# Patient Record
Sex: Female | Born: 1990 | Race: Black or African American | Hispanic: No | Marital: Single | Smoking: Current some day smoker
Health system: Southern US, Community
[De-identification: ages and names within clinical notes are randomized; demographics above are authoritative.]

## PROBLEM LIST (undated history)

## (undated) ENCOUNTER — Inpatient Hospital Stay (HOSPITAL_COMMUNITY): Admission: RE | Payer: 59 | Source: Ambulatory Visit

## (undated) ENCOUNTER — Inpatient Hospital Stay (HOSPITAL_COMMUNITY): Payer: Self-pay

## (undated) ENCOUNTER — Ambulatory Visit (HOSPITAL_COMMUNITY): Admission: EM | Source: Home / Self Care

## (undated) ENCOUNTER — Emergency Department (HOSPITAL_COMMUNITY): Admission: EM | Payer: Self-pay

## (undated) ENCOUNTER — Inpatient Hospital Stay (HOSPITAL_COMMUNITY): Admission: RE | Payer: Self-pay | Source: Ambulatory Visit

## (undated) ENCOUNTER — Inpatient Hospital Stay (HOSPITAL_COMMUNITY): Payer: Medicaid Other

## (undated) DIAGNOSIS — F32A Depression, unspecified: Secondary | ICD-10-CM

## (undated) DIAGNOSIS — I10 Essential (primary) hypertension: Secondary | ICD-10-CM

## (undated) DIAGNOSIS — N159 Renal tubulo-interstitial disease, unspecified: Secondary | ICD-10-CM

## (undated) DIAGNOSIS — Z8619 Personal history of other infectious and parasitic diseases: Secondary | ICD-10-CM

## (undated) DIAGNOSIS — F329 Major depressive disorder, single episode, unspecified: Secondary | ICD-10-CM

## (undated) DIAGNOSIS — B009 Herpesviral infection, unspecified: Secondary | ICD-10-CM

## (undated) DIAGNOSIS — A749 Chlamydial infection, unspecified: Secondary | ICD-10-CM

## (undated) HISTORY — DX: Depression, unspecified: F32.A

## (undated) HISTORY — PX: NO PAST SURGERIES: SHX2092

## (undated) HISTORY — DX: Personal history of other infectious and parasitic diseases: Z86.19

## (undated) HISTORY — DX: Major depressive disorder, single episode, unspecified: F32.9

---
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
## 2012-08-03 ENCOUNTER — Inpatient Hospital Stay (HOSPITAL_COMMUNITY): Payer: 59

## 2012-08-03 ENCOUNTER — Encounter (HOSPITAL_COMMUNITY): Payer: Self-pay

## 2012-08-03 ENCOUNTER — Inpatient Hospital Stay (HOSPITAL_COMMUNITY)
Admission: AD | Admit: 2012-08-03 | Discharge: 2012-08-03 | Disposition: A | Discharge status: Home / Self Care | Payer: 59 | Source: Ambulatory Visit | Attending: Obstetrics & Gynecology | Admitting: Obstetrics & Gynecology

## 2012-08-03 DIAGNOSIS — O209 Hemorrhage in early pregnancy, unspecified: Secondary | ICD-10-CM | POA: Insufficient documentation

## 2012-08-03 DIAGNOSIS — O3680X0 Pregnancy with inconclusive fetal viability, not applicable or unspecified: Secondary | ICD-10-CM

## 2012-08-03 DIAGNOSIS — O4690 Antepartum hemorrhage, unspecified, unspecified trimester: Secondary | ICD-10-CM

## 2012-08-03 LAB — CBC WITH DIFFERENTIAL/PLATELET
Basophils Absolute: 0 10*3/uL (ref 0.0–0.1)
Basophils Relative: 0 % (ref 0–1)
Eosinophils Absolute: 0.2 10*3/uL (ref 0.0–0.7)
MCH: 27.5 pg (ref 26.0–34.0)
MCHC: 32.5 g/dL (ref 30.0–36.0)
Monocytes Relative: 8 % (ref 3–12)
Neutrophils Relative %: 67 % (ref 43–77)
Platelets: 317 10*3/uL (ref 150–400)
RDW: 13 % (ref 11.5–15.5)

## 2012-08-03 LAB — HCG, QUANTITATIVE, PREGNANCY: hCG, Beta Chain, Quant, S: 18353 m[IU]/mL — ABNORMAL HIGH (ref <5)

## 2012-08-03 LAB — WET PREP, GENITAL

## 2012-08-03 LAB — POCT PREGNANCY, URINE: Preg Test, Ur: POSITIVE — AB

## 2012-08-03 MED ORDER — PRENATAL MULTIVITAMIN CH: 1.0000 | ORAL_TABLET | Freq: Every day | ORAL | Status: DC

## 2012-08-03 NOTE — MAU Note (Signed)
Patient is in with c/o vaginal bleeding for a week. She denies any pain. She have not started prenatal care.

## 2012-08-03 NOTE — MAU Provider Note (Signed)
Chief Complaint: Vaginal Bleeding    First Provider Initiated Contact with Patient 08/03/12 2208     SUBJECTIVE HPI: Sharon Johnston is a 22 y.o. G2P1001 at [redacted]w[redacted]d by LMP who presents with light vaginal bleeding for a week. Denies abd pain, passage of tissue, fever, chills, vaginal discharge. She states she has not had any testing this pregnancy.    Past Medical History  Diagnosis Date  . Depression     hospitalized for 2 weeks after suicide att  . Hx of gonorrhea    OB History   Grav Para Term Preterm Abortions TAB SAB Ect Mult Living   2 1 1  0 0 0 0 0 0 1     # Outc Date GA Lbr Len/2nd Wgt Sex Del Anes PTL Lv   1 TRM 7/13 [redacted]w[redacted]d 12:30 / 00:30 1.610RU(0AV4UJ) M SVD EPI  Yes   2 CUR              Past Surgical History  Procedure Laterality Date  . No past surgeries     History   Social History  . Marital Status: Single    Spouse Name: N/A    Number of Children: N/A  . Years of Education: N/A   Occupational History  . Not on file.   Social History Main Topics  . Smoking status: Never Smoker   . Smokeless tobacco: Never Used  . Alcohol Use: No  . Drug Use: No  . Sexually Active: Yes   Other Topics Concern  . Not on file   Social History Narrative  . No narrative on file   No current facility-administered medications on file prior to encounter.   No current outpatient prescriptions on file prior to encounter.   No Known Allergies  ROS: Pertinent items in HPI  OBJECTIVE Height 5\' 3"  (1.6 m), weight 57.153 kg (126 lb), last menstrual period 06/02/2012. GENERAL: Well-developed, well-nourished female in no acute distress.  HEENT: Normocephalic HEART: normal rate RESP: normal effort ABDOMEN: Soft, non-tender EXTREMITIES: Nontender, no edema NEURO: Alert and oriented SPECULUM EXAM: NEFG, scant amount of blood in vault and coming from os. No lesions. Cervix clean, non-friable.  BIMANUAL: cervix closed; uterus normal size, no adnexal tenderness or masses  LAB  RESULTS Results for orders placed during the hospital encounter of 08/03/12 (from the past 24 hour(s))  POCT PREGNANCY, URINE     Status: Abnormal   Collection Time    08/03/12  7:54 PM      Result Value Range   Preg Test, Ur POSITIVE (*) NEGATIVE  HCG, QUANTITATIVE, PREGNANCY     Status: Abnormal   Collection Time    08/03/12  8:29 PM      Result Value Range   hCG, Beta Chain, Quant, S 18353 (*) <5 mIU/mL  CBC WITH DIFFERENTIAL     Status: Abnormal   Collection Time    08/03/12  8:30 PM      Result Value Range   WBC 6.7  4.0 - 10.5 K/uL   RBC 3.93  3.87 - 5.11 MIL/uL   Hemoglobin 10.8 (*) 12.0 - 15.0 g/dL   HCT 81.1 (*) 91.4 - 78.2 %   MCV 84.5  78.0 - 100.0 fL   MCH 27.5  26.0 - 34.0 pg   MCHC 32.5  30.0 - 36.0 g/dL   RDW 95.6  21.3 - 08.6 %   Platelets 317  150 - 400 K/uL   Neutrophils Relative % 67  43 - 77 %  Neutro Abs 4.5  1.7 - 7.7 K/uL   Lymphocytes Relative 23  12 - 46 %   Lymphs Abs 1.5  0.7 - 4.0 K/uL   Monocytes Relative 8  3 - 12 %   Monocytes Absolute 0.5  0.1 - 1.0 K/uL   Eosinophils Relative 2  0 - 5 %   Eosinophils Absolute 0.2  0.0 - 0.7 K/uL   Basophils Relative 0  0 - 1 %   Basophils Absolute 0.0  0.0 - 0.1 K/uL  ABO/RH     Status: None   Collection Time    08/03/12  8:30 PM      Result Value Range   ABO/RH(D) A POS      IMAGING   MAU COURSE  ASSESSMENT 1. Vaginal bleeding in pregnancy, first trimester   2. Pregnancy with uncertain fetal viability     PLAN Discharge home in stable condition. SAB precautions.  Pelvic rest x 1 week.     Follow-up Information   Follow up with [facility] [place] ULTRASOUND In 1 week.   Contact information:   [address] [place] [address] [phone]      Follow up with [facility] [place] MATERNITY ADMISSIONS. (As needed if symptoms worsen)    Contact information:   [address] [place] [address] [phone]       Medication List      TAKE these medications       prenatal multivitamin Tabs  Take 1 tablet by mouth daily.       [place], [place] 08/03/2012  10:13 PM

## 2012-08-04 LAB — GC/CHLAMYDIA PROBE AMP
CT Probe RNA: NEGATIVE
GC Probe RNA: NEGATIVE

## 2012-08-04 LAB — ABO/RH: ABO/RH(D): A POS

## 2012-08-07 ENCOUNTER — Inpatient Hospital Stay (HOSPITAL_COMMUNITY)
Admission: AD | Admit: 2012-08-07 | Discharge: 2012-08-07 | Disposition: A | Discharge status: Home / Self Care | Payer: 59 | Source: Ambulatory Visit | Attending: Family Medicine | Admitting: Family Medicine

## 2012-08-07 ENCOUNTER — Inpatient Hospital Stay (HOSPITAL_COMMUNITY): Payer: 59

## 2012-08-07 DIAGNOSIS — R109 Unspecified abdominal pain: Secondary | ICD-10-CM | POA: Insufficient documentation

## 2012-08-07 DIAGNOSIS — O039 Complete or unspecified spontaneous abortion without complication: Secondary | ICD-10-CM | POA: Insufficient documentation

## 2012-08-07 LAB — CBC
Hemoglobin: 11.4 g/dL — ABNORMAL LOW (ref 12.0–15.0)
MCV: 84.6 fL (ref 78.0–100.0)
Platelets: 331 10*3/uL (ref 150–400)
RBC: 4.16 MIL/uL (ref 3.87–5.11)
WBC: 5.9 10*3/uL (ref 4.0–10.5)

## 2012-08-07 LAB — HCG, QUANTITATIVE, PREGNANCY: hCG, Beta Chain, Quant, S: 11607 m[IU]/mL — ABNORMAL HIGH (ref <5)

## 2012-08-07 MED ORDER — IBUPROFEN 800 MG PO TABS: 800.0000 mg | ORAL_TABLET | Freq: Three times a day (TID) | ORAL | Status: DC | PRN

## 2012-08-07 NOTE — MAU Provider Note (Signed)
  History     CSN: [id number]  Arrival date and time: 08/07/12 2013   None     Chief Complaint  Patient presents with  . Vaginal Bleeding  . Abdominal Pain   HPI  Sharon Johnston is a 22 y.o. G2P1001 at [redacted]w[redacted]d who presents today via EMS with vaginal bleeding since 0300 today.   Past Medical History  Diagnosis Date  . Depression     hospitalized for 2 weeks after suicide att  . Hx of gonorrhea     Past Surgical History  Procedure Laterality Date  . No past surgeries      Family History  Problem Relation Age of Onset  . Hypertension Mother   . Hypertension Maternal Aunt   . Hypertension Maternal Grandmother   . Depression Cousin     History  Substance Use Topics  . Smoking status: Never Smoker   . Smokeless tobacco: Never Used  . Alcohol Use: No    Allergies: No Known Allergies  Prescriptions prior to admission  Medication Sig Dispense Refill  . Prenatal Vit-Fe Fumarate-FA (PRENATAL MULTIVITAMIN) TABS Take 1 tablet by mouth daily.  30 tablet  12    ROS Physical Exam   Blood pressure 120/63, pulse 69, temperature 98.7 F (37.1 C), temperature source Oral, resp. rate 16, height 5\' 3"  (1.6 m), weight 57.425 kg (126 lb 9.6 oz), last menstrual period 06/02/2012.  Physical Exam  MAU Course  Procedures  Results for orders placed during the hospital encounter of 08/07/12 (from the past 24 hour(s))  CBC     Status: Abnormal   Collection Time    08/07/12  8:30 PM      Result Value Range   WBC 5.9  4.0 - 10.5 K/uL   RBC 4.16  3.87 - 5.11 MIL/uL   Hemoglobin 11.4 (*) 12.0 - 15.0 g/dL   HCT 40.9 (*) 81.1 - 91.4 %   MCV 84.6  78.0 - 100.0 fL   MCH 27.4  26.0 - 34.0 pg   MCHC 32.4  30.0 - 36.0 g/dL   RDW 78.2  95.6 - 21.3 %   Platelets 331  150 - 400 K/uL  HCG, QUANTITATIVE, PREGNANCY     Status: Abnormal   Collection Time    08/07/12  8:30 PM      Result Value Range   hCG, Beta Chain, Quant, S 11607 (*) <5 mIU/mL   HCG is down from Results for Sharon Johnston, Sharon Johnston (MRN [id number]) as of 08/07/2012 21:21  Ref. Range 08/03/2012 20:29  hCG, Beta Chain, Quant, S Latest Range: <5 mIU/mL 18353 (H)      Assessment and Plan   1. SAB (spontaneous abortion)    FU in GYN clinic Ibuprofen given for discomfort Bleeding precautions discussed.   Tawnya Crook 08/07/2012, 9:20 PM

## 2012-08-07 NOTE — MAU Note (Signed)
Patient was seen in MAU a couple of days ago heavy bleeding started today around 3:00 a.m. Cramping passing little clots using toilet tissue has not changed pads

## 2012-08-08 NOTE — MAU Provider Note (Signed)
Chart reviewed and agree with management and plan.  

## 2012-08-10 ENCOUNTER — Ambulatory Visit (HOSPITAL_COMMUNITY): Payer: 59

## 2012-08-22 ENCOUNTER — Encounter: Payer: Self-pay | Admitting: Advanced Practice Midwife

## 2012-09-20 NOTE — Progress Notes (Signed)
This encounter was created in error - please disregard.

## 2012-12-11 ENCOUNTER — Emergency Department (HOSPITAL_COMMUNITY)
Admission: EM | Admit: 2012-12-11 | Discharge: 2012-12-12 | Disposition: A | Discharge status: Home / Self Care | Payer: 59 | Attending: Emergency Medicine | Admitting: Emergency Medicine

## 2012-12-11 ENCOUNTER — Encounter: Payer: Self-pay | Admitting: Nurse Practitioner

## 2012-12-11 ENCOUNTER — Encounter (HOSPITAL_COMMUNITY): Payer: Self-pay | Admitting: Emergency Medicine

## 2012-12-11 DIAGNOSIS — Z8659 Personal history of other mental and behavioral disorders: Secondary | ICD-10-CM | POA: Insufficient documentation

## 2012-12-11 DIAGNOSIS — Z331 Pregnant state, incidental: Secondary | ICD-10-CM

## 2012-12-11 DIAGNOSIS — R45851 Suicidal ideations: Secondary | ICD-10-CM | POA: Insufficient documentation

## 2012-12-11 DIAGNOSIS — Z8619 Personal history of other infectious and parasitic diseases: Secondary | ICD-10-CM | POA: Insufficient documentation

## 2012-12-11 LAB — RAPID URINE DRUG SCREEN, HOSP PERFORMED
Barbiturates: NOT DETECTED
Cocaine: NOT DETECTED

## 2012-12-11 LAB — COMPREHENSIVE METABOLIC PANEL
Alkaline Phosphatase: 49 U/L (ref 39–117)
BUN: 13 mg/dL (ref 6–23)
CO2: 25 mEq/L (ref 19–32)
Calcium: 9.1 mg/dL (ref 8.4–10.5)
Creatinine, Ser: 0.68 mg/dL (ref 0.50–1.10)
GFR calc Af Amer: >90 mL/min (ref >90)
GFR calc non Af Amer: >90 mL/min (ref >90)
Glucose, Bld: 104 mg/dL — ABNORMAL HIGH (ref 70–99)
Potassium: 3.2 mEq/L — ABNORMAL LOW (ref 3.5–5.1)
Total Protein: 7 g/dL (ref 6.0–8.3)

## 2012-12-11 LAB — CBC
HCT: 32.5 % — ABNORMAL LOW (ref 36.0–46.0)
Hemoglobin: 10.5 g/dL — ABNORMAL LOW (ref 12.0–15.0)
MCH: 26.5 pg (ref 26.0–34.0)
MCHC: 32.3 g/dL (ref 30.0–36.0)
MCV: 82.1 fL (ref 78.0–100.0)
RDW: 13.9 % (ref 11.5–15.5)

## 2012-12-11 LAB — POCT PREGNANCY, URINE: Preg Test, Ur: POSITIVE — AB

## 2012-12-11 LAB — ETHANOL: Alcohol, Ethyl (B): <11 mg/dL (ref 0–11)

## 2012-12-11 LAB — SALICYLATE LEVEL: Salicylate Lvl: <2 mg/dL — ABNORMAL LOW (ref 2.8–20.0)

## 2012-12-11 MED ORDER — ONDANSETRON HCL 4 MG PO TABS: 4.0000 mg | ORAL_TABLET | Freq: Three times a day (TID) | ORAL | Status: DC | PRN
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
## 2012-12-11 NOTE — ED Notes (Signed)
Sharon Johnston, pt mother can be contacted at [phone]

## 2012-12-11 NOTE — ED Notes (Signed)
Pt reports that she has been upset about her significant other and began having thoughts of harming herself by cutting herself with a knife today.  Denies HI or AVH. Pt calm and cooperative in triage.
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
## 2012-12-12 ENCOUNTER — Encounter (HOSPITAL_COMMUNITY): Payer: Self-pay | Admitting: *Deleted

## 2012-12-12 ENCOUNTER — Inpatient Hospital Stay (HOSPITAL_COMMUNITY)
Admission: AD | Admit: 2012-12-12 | Discharge: 2012-12-12 | Disposition: A | Discharge status: Home / Self Care | Payer: 59 | Source: Ambulatory Visit | Attending: Obstetrics & Gynecology | Admitting: Obstetrics & Gynecology

## 2012-12-12 ENCOUNTER — Inpatient Hospital Stay (HOSPITAL_COMMUNITY): Payer: 59

## 2012-12-12 DIAGNOSIS — O9989 Other specified diseases and conditions complicating pregnancy, childbirth and the puerperium: Secondary | ICD-10-CM

## 2012-12-12 DIAGNOSIS — A499 Bacterial infection, unspecified: Secondary | ICD-10-CM | POA: Insufficient documentation

## 2012-12-12 DIAGNOSIS — B9689 Other specified bacterial agents as the cause of diseases classified elsewhere: Secondary | ICD-10-CM | POA: Insufficient documentation

## 2012-12-12 DIAGNOSIS — R109 Unspecified abdominal pain: Secondary | ICD-10-CM | POA: Insufficient documentation

## 2012-12-12 DIAGNOSIS — O26899 Other specified pregnancy related conditions, unspecified trimester: Secondary | ICD-10-CM

## 2012-12-12 DIAGNOSIS — N760 Acute vaginitis: Secondary | ICD-10-CM | POA: Insufficient documentation

## 2012-12-12 DIAGNOSIS — O2390 Unspecified genitourinary tract infection in pregnancy, unspecified trimester: Secondary | ICD-10-CM | POA: Insufficient documentation

## 2012-12-12 LAB — URINALYSIS, ROUTINE W REFLEX MICROSCOPIC
Bilirubin Urine: NEGATIVE
Hgb urine dipstick: NEGATIVE
Leukocytes, UA: NEGATIVE
Nitrite: POSITIVE — AB
Protein, ur: NEGATIVE mg/dL
Specific Gravity, Urine: 1.025 (ref 1.005–1.030)
Urobilinogen, UA: 0.2 mg/dL (ref 0.0–1.0)
pH: 6.5 (ref 5.0–8.0)

## 2012-12-12 LAB — CBC
HCT: 32.5 % — ABNORMAL LOW (ref 36.0–46.0)
Hemoglobin: 10.5 g/dL — ABNORMAL LOW (ref 12.0–15.0)
MCH: 26.4 pg (ref 26.0–34.0)
MCHC: 32.3 g/dL (ref 30.0–36.0)
MCV: 81.7 fL (ref 78.0–100.0)
RBC: 3.98 MIL/uL (ref 3.87–5.11)

## 2012-12-12 LAB — WET PREP, GENITAL
Trich, Wet Prep: NONE SEEN
Yeast Wet Prep HPF POC: NONE SEEN

## 2012-12-12 MED ORDER — METRONIDAZOLE 500 MG PO TABS: 500.0000 mg | ORAL_TABLET | Freq: Two times a day (BID) | ORAL | Status: DC

## 2012-12-12 MED ORDER — POTASSIUM CHLORIDE CRYS ER 20 MEQ PO TBCR
40.0000 meq | EXTENDED_RELEASE_TABLET | ORAL | Status: AC
  Administered 2012-12-12: 40 meq via ORAL
  Filled 2012-12-12: qty 2

## 2012-12-12 NOTE — MAU Provider Note (Signed)
History     CSN: [id number]  Arrival date and time: 12/12/12 1558   First Provider Initiated Contact with Patient 12/12/12 1704      Chief Complaint  Patient presents with  . Abdominal Pain   HPI Sharon Johnston is a 22 y.o. G3P1011 at [redacted]w[redacted]d who presents to MAU today with complaint of lower abdominal pain. The patient was seen at [facility] last night, possibly by [facility], and told that she was pregnant. She was unaware as she had had a negative HPT about a week prior. She did not tell them about her abdominal pain at the hospital last night. She states that she has been having lower abdominal pain. It is rated at 2/10 now. She is also having a thick, white vaginal discharge. She denies vaginal bleeding, N/V/D or constipation, fever or UTI symptoms.   OB History   Grav Para Term Preterm Abortions TAB SAB Ect Mult Living   3 1 1  0 1 0 1 0 0 1      Past Medical History  Diagnosis Date  . Depression     hospitalized for 2 weeks after suicide att  . Hx of gonorrhea     Past Surgical History  Procedure Laterality Date  . No past surgeries      Family History  Problem Relation Age of Onset  . Hypertension Mother   . Hypertension Maternal Aunt   . Hypertension Maternal Grandmother   . Depression Cousin     History  Substance Use Topics  . Smoking status: Never Smoker   . Smokeless tobacco: Never Used  . Alcohol Use: No    Allergies: No Known Allergies  No prescriptions prior to admission    Review of Systems  Constitutional: Negative for fever and malaise/fatigue.  Gastrointestinal: Positive for abdominal pain. Negative for nausea, vomiting, diarrhea and constipation.  Genitourinary: Negative for dysuria, urgency and frequency.       Neg - vaginal bleeding + vaginal discharge  Neurological: Negative for dizziness and loss of consciousness.   Physical Exam   Blood pressure 135/80, pulse 71, temperature 98.1 F (36.7 C), temperature source Oral, resp. rate 16, height  5\' 3"  (1.6 m), weight 122 lb (55.339 kg), last menstrual period 11/07/2012.  Physical Exam  Constitutional: She is oriented to person, place, and time. She appears well-developed and well-nourished. No distress.  HENT:  Head: Normocephalic and atraumatic.  Cardiovascular: Normal rate, regular rhythm and normal heart sounds.   Respiratory: Effort normal and breath sounds normal. No respiratory distress.  GI: Soft. Bowel sounds are normal. She exhibits no distension and no mass. There is tenderness (moderate tenderness to palpation of the lower abdomen at midline). There is no rebound and no guarding.  Genitourinary: Uterus is tender (mild tenderness to palpation on bimanual exam). Uterus is not enlarged. Cervix exhibits no motion tenderness, no discharge and no friability. Right adnexum displays tenderness. Right adnexum displays no mass. Left adnexum displays tenderness. Left adnexum displays no mass. No bleeding around the vagina. Vaginal discharge (small amount of thin, white, frothy discahrge noted) found.  Neurological: She is alert and oriented to person, place, and time.  Skin: Skin is warm and dry. No erythema.  Psychiatric: She has a normal mood and affect.   Results for orders placed during the hospital encounter of 12/12/12 (from the past 24 hour(s))  URINALYSIS, ROUTINE W REFLEX MICROSCOPIC     Status: Abnormal   Collection Time    12/12/12  4:54 PM  Result Value Range   Color, Urine YELLOW  YELLOW   APPearance HAZY (*) CLEAR   Specific Gravity, Urine 1.025  1.005 - 1.030   pH 6.5  5.0 - 8.0   Glucose, UA NEGATIVE  NEGATIVE mg/dL   Hgb urine dipstick NEGATIVE  NEGATIVE   Bilirubin Urine NEGATIVE  NEGATIVE   Ketones, ur NEGATIVE  NEGATIVE mg/dL   Protein, ur NEGATIVE  NEGATIVE mg/dL   Urobilinogen, UA 0.2  0.0 - 1.0 mg/dL   Nitrite POSITIVE (*) NEGATIVE   Leukocytes, UA NEGATIVE  NEGATIVE  URINE MICROSCOPIC-ADD ON     Status: Abnormal   Collection Time    12/12/12  4:54  PM      Result Value Range   Squamous Epithelial / LPF FEW (*) RARE   WBC, UA 3-6  <3 WBC/hpf   Bacteria, UA MANY (*) RARE  WET PREP, GENITAL     Status: Abnormal   Collection Time    12/12/12  5:14 PM      Result Value Range   Yeast Wet Prep HPF POC NONE SEEN  NONE SEEN   Trich, Wet Prep NONE SEEN  NONE SEEN   Clue Cells Wet Prep HPF POC FEW (*) NONE SEEN   WBC, Wet Prep HPF POC MODERATE (*) NONE SEEN  CBC     Status: Abnormal   Collection Time    12/12/12  5:21 PM      Result Value Range   WBC 4.0  4.0 - 10.5 K/uL   RBC 3.98  3.87 - 5.11 MIL/uL   Hemoglobin 10.5 (*) 12.0 - 15.0 g/dL   HCT 16.1 (*) 09.6 - 04.5 %   MCV 81.7  78.0 - 100.0 fL   MCH 26.4  26.0 - 34.0 pg   MCHC 32.3  30.0 - 36.0 g/dL   RDW 40.9  81.1 - 91.4 %   Platelets 354  150 - 400 K/uL  HCG, QUANTITATIVE, PREGNANCY     Status: Abnormal   Collection Time    12/12/12  5:21 PM      Result Value Range   hCG, Beta Chain, Quant, S 1018 (*) <5 mIU/mL   US Ob Comp Less 14 Wks  12/12/2012   CLINICAL DATA:  Lower abdominal pain  EXAM: OBSTETRIC <14 WK ULTRASOUND  TECHNIQUE: Transabdominal ultrasound was performed for evaluation of the gestation as well as the maternal uterus and adnexal regions.  COMPARISON:  08/07/2012  FINDINGS: Intrauterine gestational sac: Visualized  Yolk sac:  None  Embryo:  None  MSD: 0.19 cm   4 w   4 d [place] EDC: 08/17/2013  Maternal uterus/adnexae:  Subchorionic hemorrhage: Small  Right ovary: Normal  Left ovary: Normal containing corpus luteum  Other :None  Free fluid: Trace  IMPRESSION: 1. Single, small intrauterine gestational sac. The estimated gestational age is 4 weeks and 4 days. No yolk sac or embryo identified. Suggest followup imaging in 7-10 days.  2. Small subchorionic hemorrhage.  3. Trace free fluid within the pelvis.   Electronically Signed   By: Signa Kell M.D.   On: 12/12/2012 18:09   US Ob Transvaginal  12/12/2012   CLINICAL DATA:  Lower abdominal pain  EXAM: OBSTETRIC <14 WK  ULTRASOUND  TECHNIQUE: Transabdominal ultrasound was performed for evaluation of the gestation as well as the maternal uterus and adnexal regions.  COMPARISON:  08/07/2012  FINDINGS: Intrauterine gestational sac: Visualized  Yolk sac:  None  Embryo:  None  MSD: 0.19 cm  4 w   4 d [place] EDC: 08/17/2013  Maternal uterus/adnexae:  Subchorionic hemorrhage: Small  Right ovary: Normal  Left ovary: Normal containing corpus luteum  Other :None  Free fluid: Trace  IMPRESSION: 1. Single, small intrauterine gestational sac. The estimated gestational age is 4 weeks and 4 days. No yolk sac or embryo identified. Suggest followup imaging in 7-10 days.  2. Small subchorionic hemorrhage.  3. Trace free fluid within the pelvis.   Electronically Signed   By: Signa Kell M.D.   On: 12/12/2012 18:09    MAU Course  Procedures None  MDM UA, Wet prep, GC/Chlamydia, CBC, quant hCG and [place] today A+ blood type from previous visit in Epic  Assessment and Plan  A: IUGS at [redacted]w[redacted]d without YS/FP Bacterial vaginosis  P: Discharge home Rx for Flagyl sent to patient's pharmacy Patient will return to MAU in 48 hours for repeat quant hCG Bleeding/ectopic precautions discussed Patient may return to MAU as needed sooner  Freddi Starr, PA-C  12/12/2012, 6:48 PM

## 2012-12-12 NOTE — ED Notes (Signed)
Patient arrived to unit with no s/s of distress noted. Pt pleasant and cooperative with staff. Pt denies SI/HI and verbally contracts for safety.

## 2012-12-12 NOTE — ED Provider Notes (Signed)
Medical screening examination/treatment/procedure(s) were performed by non-physician practitioner and as supervising physician I was immediately available for consultation/collaboration.  Shon Baton, MD 12/12/12 [phone]
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
## 2012-12-12 NOTE — MAU Note (Signed)
Has been having abd pain, cramping- feel like cycle is going to start, since Monday. Went to [employer]  Last night (first said [facility]) was told she is pregnant.  She did not tell them she was having pain.

## 2012-12-12 NOTE — ED Notes (Signed)
AVS printed and given to patient for reference.

## 2012-12-12 NOTE — ED Notes (Signed)
Pt signed safety contract, agrees to no self harm.

## 2012-12-13 LAB — GC/CHLAMYDIA PROBE AMP: CT Probe RNA: NEGATIVE

## 2012-12-14 ENCOUNTER — Inpatient Hospital Stay (HOSPITAL_COMMUNITY)
Admission: AD | Admit: 2012-12-14 | Discharge: 2012-12-14 | Disposition: A | Discharge status: Home / Self Care | Payer: 59 | Source: Ambulatory Visit | Attending: Obstetrics & Gynecology | Admitting: Obstetrics & Gynecology

## 2012-12-14 DIAGNOSIS — O26899 Other specified pregnancy related conditions, unspecified trimester: Secondary | ICD-10-CM

## 2012-12-14 DIAGNOSIS — O9989 Other specified diseases and conditions complicating pregnancy, childbirth and the puerperium: Secondary | ICD-10-CM

## 2012-12-14 DIAGNOSIS — O99891 Other specified diseases and conditions complicating pregnancy: Secondary | ICD-10-CM | POA: Insufficient documentation

## 2012-12-14 DIAGNOSIS — R109 Unspecified abdominal pain: Secondary | ICD-10-CM

## 2012-12-14 LAB — URINE CULTURE: Colony Count: >=100000

## 2012-12-14 LAB — HCG, QUANTITATIVE, PREGNANCY: hCG, Beta Chain, Quant, S: 2185 m[IU]/mL — ABNORMAL HIGH (ref <5)

## 2012-12-14 NOTE — MAU Note (Signed)
Patient to MAU for repeat BHCG. Patient denies pain or bleeding. States she had one episode of diarrhea this am with blood in it.

## 2012-12-14 NOTE — MAU Provider Note (Signed)
Attestation of Attending Supervision of Advanced Practitioner (CNM/NP): Evaluation and management procedures were performed by the Advanced Practitioner under my supervision and collaboration.  I have reviewed the Advanced Practitioner's note and chart, and I agree with the management and plan.  HARRAWAY-SMITH, Concepcion Kirkpatrick 3:26 PM     

## 2012-12-14 NOTE — MAU Provider Note (Signed)
[name] is a 22 y.o. G3P1011 at [redacted]w[redacted]d who presents to MAU today for follow-up quant hCG. Quant hCG on 12/12/12 was 1018. The patient denies abdominal pain or bleeding today.   BP 116/67  Pulse 73  Temp(Src) 98.2 F (36.8 C) (Oral)  Resp 16  SpO2 100%  LMP 11/07/2012 GENERAL: Well-developed, well-nourished female in no acute distress.  HEENT: Normocephalic, atraumatic.   LUNGS: Effort normal HEART: Regular rate  SKIN: Warm, dry and without erythema PSYCH: Normal mood and affect  Results for orders placed during the hospital encounter of 12/14/12 (from the past 24 hour(s))  HCG, QUANTITATIVE, PREGNANCY     Status: Abnormal   Collection Time    12/14/12 10:57 AM      Result Value Range   hCG, Beta Chain, Quant, S 2185 (*) <5 mIU/mL    A: Appropriate rise in quant hCG  P: Discharge home [place] scheduled for 12/21/12 to confirm viability First trimester warning signs discussed Patient may return to MAU as needed sooner  Freddi Starr, PA-C 12/14/2012 1:03 PM

## 2012-12-15 ENCOUNTER — Other Ambulatory Visit: Payer: Self-pay | Admitting: Family

## 2012-12-16 NOTE — Progress Notes (Signed)
Called [phone] (disconnected) and left message at [phone] for patient to call MAU to inform regarding +UTI and RX for Keflex sent to pharmacy on file.  Please inform patient when and if the call MAU or at next prenatal care appointment.

## 2012-12-21 ENCOUNTER — Inpatient Hospital Stay (HOSPITAL_COMMUNITY)
Admission: AD | Admit: 2012-12-21 | Discharge: 2012-12-21 | Disposition: A | Discharge status: Home / Self Care | Payer: 59 | Source: Ambulatory Visit | Attending: Obstetrics & Gynecology | Admitting: Obstetrics & Gynecology

## 2012-12-21 ENCOUNTER — Telehealth: Payer: Self-pay | Admitting: Medical

## 2012-12-21 ENCOUNTER — Ambulatory Visit (HOSPITAL_COMMUNITY)
Admit: 2012-12-21 | Discharge: 2012-12-21 | Disposition: A | Discharge status: Home / Self Care | Payer: 59 | Attending: Medical | Admitting: Medical

## 2012-12-21 DIAGNOSIS — Z3689 Encounter for other specified antenatal screening: Secondary | ICD-10-CM | POA: Insufficient documentation

## 2012-12-21 NOTE — Telephone Encounter (Signed)
Called to inform patient of normal [place] results. IUP at [redacted]w[redacted]d with cardiac activity noted. Patient unsure of where she will go for prenatal care. Declines referral to [facility] clinics.   Freddi Starr, PA-C 12/21/2012 4:33 PM

## 2013-02-06 ENCOUNTER — Encounter: Payer: Self-pay | Admitting: Advanced Practice Midwife

## 2013-03-21 NOTE — L&D Delivery Note (Signed)
Delivery Note At 3:56 AM a viable female was delivered via  (Presentation: direct OA ).  APGAR: 9,9.   Placenta status: delivered with cord traction via Tomasa Blase.   Cord:  3 vessels with no following complications  Anesthesia: Epidural  Episiotomy: None Lacerations: None Suture Repair: n/a Est. Blood Loss (mL): 200 ml  Mom to postpartum.  Baby to Couplet care / Skin to Skin.  Antionette Char 08/11/2013, 4:05 AM

## 2013-03-25 ENCOUNTER — Ambulatory Visit (INDEPENDENT_AMBULATORY_CARE_PROVIDER_SITE_OTHER): Payer: 59 | Admitting: Obstetrics

## 2013-03-25 ENCOUNTER — Encounter: Payer: Self-pay | Admitting: Obstetrics

## 2013-03-25 VITALS — BP 122/71 | Temp 97.9°F | Wt 131.0 lb

## 2013-03-25 DIAGNOSIS — Z3201 Encounter for pregnancy test, result positive: Secondary | ICD-10-CM

## 2013-03-25 DIAGNOSIS — Z3482 Encounter for supervision of other normal pregnancy, second trimester: Secondary | ICD-10-CM

## 2013-03-25 DIAGNOSIS — Z363 Encounter for antenatal screening for malformations: Secondary | ICD-10-CM

## 2013-03-25 DIAGNOSIS — N760 Acute vaginitis: Secondary | ICD-10-CM

## 2013-03-25 DIAGNOSIS — Z1389 Encounter for screening for other disorder: Secondary | ICD-10-CM

## 2013-03-25 LAB — POCT URINALYSIS DIPSTICK
Bilirubin, UA: NEGATIVE
Blood, UA: NEGATIVE
GLUCOSE UA: NEGATIVE
KETONES UA: NEGATIVE
Leukocytes, UA: NEGATIVE
Nitrite, UA: NEGATIVE
PROTEIN UA: NEGATIVE
Spec Grav, UA: 1.01
UROBILINOGEN UA: NEGATIVE
pH, UA: 6

## 2013-03-25 LAB — POCT URINE PREGNANCY: PREG TEST UR: POSITIVE

## 2013-03-25 NOTE — Progress Notes (Signed)
HR - 65 Pt in office today for new OB visit, reports white vaginal discharge with odor and irritation, would like to be checked for STDs  Subjective:    Agape N Earlene PlaterDavis is being seen today for her first obstetrical visit.  This is not a planned pregnancy. She is at 745w5d gestation. Her obstetrical history is significant for none. Relationship with FOB: not involved. Patient does intend to breast feed. Pregnancy history fully reviewed.  Menstrual History: OB History   Grav Para Term Preterm Abortions TAB SAB Ect Mult Living   3 1 1  0 1 0 1 0 0 1      Menarche age: 33 years  Patient's last menstrual period was 11/07/2012.    The following portions of the patient's history were reviewed and updated as appropriate: allergies, current medications, past family history, past medical history, past social history, past surgical history and problem list.  Review of Systems Pertinent items are noted in HPI.    Objective:    General appearance: alert and no distress Abdomen: normal findings: soft, non-tender Pelvic: cervix normal in appearance, external genitalia normal, no adnexal masses or tenderness, no cervical motion tenderness, rectovaginal septum normal and vagina normal without discharge.  Uterus 19 weeks, soft, NT.  Vagina with copious thin, grey discharge. Extremities: extremities normal, atraumatic, no cyanosis or edema    Assessment:    Pregnancy at 195w5d weeks    Plan:    Initial labs drawn. Prenatal vitamins.  Counseling provided regarding continued use of seat belts, cessation of alcohol consumption, smoking or use of illicit drugs; infection precautions i.e., influenza/TDAP immunizations, toxoplasmosis,CMV, parvovirus, listeria and varicella; workplace safety, exercise during pregnancy; routine dental care, safe medications, sexual activity, hot tubs, saunas, pools, travel, caffeine use, fish and methlymercury, potential toxins, hair treatments, varicose veins Weight gain  recommendations per IOM guidelines reviewed: underweight/BMI< 18.5--> gain 28 - 40 lbs; normal weight/BMI 18.5 - 24.9--> gain 25 - 35 lbs; overweight/BMI 25 - 29.9--> gain 15 - 25 lbs; obese/BMI >30->gain  11 - 20 lbs Problem list reviewed and updated. FIRST/CF mutation testing/NIPT/QUAD SCREEN discussed: requested. Role of ultrasound in pregnancy discussed; fetal survey: requested. Amniocentesis discussed: not indicated. VBAC calculator score: VBAC consent form provided Follow up in 4 weeks. 50% of 20 min visit spent on counseling and coordination of care.

## 2013-03-26 LAB — GC/CHLAMYDIA PROBE AMP
CT PROBE, AMP APTIMA: NEGATIVE
GC Probe RNA: NEGATIVE

## 2013-03-26 LAB — PAP IG W/ RFLX HPV ASCU

## 2013-03-26 LAB — WET PREP BY MOLECULAR PROBE
CANDIDA SPECIES: NEGATIVE
Gardnerella vaginalis: NEGATIVE
TRICHOMONAS VAG: NEGATIVE

## 2013-03-27 ENCOUNTER — Ambulatory Visit (INDEPENDENT_AMBULATORY_CARE_PROVIDER_SITE_OTHER): Payer: 59

## 2013-03-27 ENCOUNTER — Encounter: Payer: Self-pay | Admitting: Obstetrics & Gynecology

## 2013-03-27 ENCOUNTER — Other Ambulatory Visit: Payer: Medicaid Other

## 2013-03-27 DIAGNOSIS — Z363 Encounter for antenatal screening for malformations: Secondary | ICD-10-CM

## 2013-03-27 DIAGNOSIS — Z1389 Encounter for screening for other disorder: Secondary | ICD-10-CM

## 2013-03-27 LAB — CULTURE, OB URINE: Colony Count: >=100000

## 2013-03-27 LAB — US OB DETAIL + 14 WK

## 2013-03-28 ENCOUNTER — Other Ambulatory Visit: Payer: Self-pay | Admitting: *Deleted

## 2013-03-28 DIAGNOSIS — N390 Urinary tract infection, site not specified: Secondary | ICD-10-CM

## 2013-03-28 LAB — OBSTETRIC PANEL
Antibody Screen: NEGATIVE
BASOS PCT: 0 % (ref 0–1)
Basophils Absolute: 0 10*3/uL (ref 0.0–0.1)
Eosinophils Absolute: 0.1 10*3/uL (ref 0.0–0.7)
Eosinophils Relative: 1 % (ref 0–5)
HCT: 35.5 % — ABNORMAL LOW (ref 36.0–46.0)
HEP B S AG: NEGATIVE
Hemoglobin: 12 g/dL (ref 12.0–15.0)
LYMPHS PCT: 13 % (ref 12–46)
Lymphs Abs: 0.9 10*3/uL (ref 0.7–4.0)
MCH: 28 pg (ref 26.0–34.0)
MCHC: 33.8 g/dL (ref 30.0–36.0)
MCV: 82.8 fL (ref 78.0–100.0)
Monocytes Absolute: 0.3 10*3/uL (ref 0.1–1.0)
Monocytes Relative: 4 % (ref 3–12)
NEUTROS ABS: 5.8 10*3/uL (ref 1.7–7.7)
NEUTROS PCT: 82 % — AB (ref 43–77)
Platelets: 381 10*3/uL (ref 150–400)
RBC: 4.29 MIL/uL (ref 3.87–5.11)
RDW: 14.5 % (ref 11.5–15.5)
RUBELLA: 1.69 {index} — AB (ref <0.90)
Rh Type: POSITIVE
WBC: 7 10*3/uL (ref 4.0–10.5)

## 2013-03-28 LAB — VARICELLA ZOSTER ANTIBODY, IGG: VARICELLA IGG: 2797 {index} — AB (ref <135.00)

## 2013-03-28 LAB — VITAMIN D 25 HYDROXY (VIT D DEFICIENCY, FRACTURES): Vit D, 25-Hydroxy: 20 ng/mL — ABNORMAL LOW (ref 30–89)

## 2013-03-28 LAB — HIV ANTIBODY (ROUTINE TESTING W REFLEX): HIV: NONREACTIVE

## 2013-03-28 MED ORDER — SULFAMETHOXAZOLE-TMP DS 800-160 MG PO TABS: 1.0000 | ORAL_TABLET | Freq: Two times a day (BID) | ORAL | Status: DC

## 2013-03-29 LAB — HEMOGLOBINOPATHY EVALUATION
HGB A2 QUANT: 2.6 % (ref 2.2–3.2)
HGB F QUANT: 0 % (ref 0.0–2.0)
HGB S QUANTITAION: 0 %
Hemoglobin Other: 0 %
Hgb A: 97.4 % (ref 96.8–97.8)

## 2013-04-21 ENCOUNTER — Inpatient Hospital Stay (HOSPITAL_COMMUNITY)
Admission: AD | Admit: 2013-04-21 | Discharge: 2013-04-21 | Disposition: A | Discharge status: Home / Self Care | Payer: Medicaid Other | Source: Ambulatory Visit | Attending: Family Medicine | Admitting: Family Medicine

## 2013-04-21 ENCOUNTER — Encounter (HOSPITAL_COMMUNITY): Payer: Self-pay | Admitting: *Deleted

## 2013-04-21 DIAGNOSIS — Y929 Unspecified place or not applicable: Secondary | ICD-10-CM | POA: Insufficient documentation

## 2013-04-21 DIAGNOSIS — O99891 Other specified diseases and conditions complicating pregnancy: Secondary | ICD-10-CM | POA: Insufficient documentation

## 2013-04-21 DIAGNOSIS — N390 Urinary tract infection, site not specified: Secondary | ICD-10-CM | POA: Insufficient documentation

## 2013-04-21 DIAGNOSIS — N3090 Cystitis, unspecified without hematuria: Secondary | ICD-10-CM

## 2013-04-21 DIAGNOSIS — O9989 Other specified diseases and conditions complicating pregnancy, childbirth and the puerperium: Principal | ICD-10-CM

## 2013-04-21 DIAGNOSIS — N898 Other specified noninflammatory disorders of vagina: Secondary | ICD-10-CM | POA: Insufficient documentation

## 2013-04-21 DIAGNOSIS — O2390 Unspecified genitourinary tract infection in pregnancy, unspecified trimester: Secondary | ICD-10-CM | POA: Insufficient documentation

## 2013-04-21 DIAGNOSIS — R109 Unspecified abdominal pain: Secondary | ICD-10-CM | POA: Insufficient documentation

## 2013-04-21 DIAGNOSIS — R51 Headache: Secondary | ICD-10-CM | POA: Insufficient documentation

## 2013-04-21 LAB — WET PREP, GENITAL
Clue Cells Wet Prep HPF POC: NONE SEEN
Trich, Wet Prep: NONE SEEN
Yeast Wet Prep HPF POC: NONE SEEN

## 2013-04-21 LAB — URINALYSIS, ROUTINE W REFLEX MICROSCOPIC
Bilirubin Urine: NEGATIVE
Glucose, UA: NEGATIVE mg/dL
Ketones, ur: NEGATIVE mg/dL
LEUKOCYTES UA: NEGATIVE
NITRITE: POSITIVE — AB
PH: 6.5 (ref 5.0–8.0)
PROTEIN: NEGATIVE mg/dL
Specific Gravity, Urine: >1.03 — ABNORMAL HIGH (ref 1.005–1.030)
Urobilinogen, UA: 0.2 mg/dL (ref 0.0–1.0)

## 2013-04-21 LAB — OB RESULTS CONSOLE GC/CHLAMYDIA
CHLAMYDIA, DNA PROBE: NEGATIVE
Gonorrhea: NEGATIVE

## 2013-04-21 LAB — URINE MICROSCOPIC-ADD ON

## 2013-04-21 MED ORDER — SULFAMETHOXAZOLE-TMP DS 800-160 MG PO TABS: 1.0000 | ORAL_TABLET | Freq: Two times a day (BID) | ORAL | Status: DC

## 2013-04-21 MED ORDER — ALBUTEROL SULFATE (2.5 MG/3ML) 0.083% IN NEBU: 2.5000 mg | INHALATION_SOLUTION | Freq: Once | RESPIRATORY_TRACT | Status: DC

## 2013-04-21 MED ORDER — ACETAMINOPHEN 325 MG PO TABS
650.0000 mg | ORAL_TABLET | Freq: Once | ORAL | Status: AC
  Administered 2013-04-21: 650 mg via ORAL
  Filled 2013-04-21: qty 2

## 2013-04-21 MED ORDER — IPRATROPIUM BROMIDE 0.02 % IN SOLN: 0.5000 mg | Freq: Once | RESPIRATORY_TRACT | Status: DC

## 2013-04-21 NOTE — Discharge Instructions (Signed)
Pregnancy and Urinary Tract Infection  A urinary tract infection (UTI) is a bacterial infection of the urinary tract. Infection of the urinary tract can include the ureters, kidneys (pyelonephritis), bladder (cystitis), and urethra (urethritis). All pregnant women should be screened for bacteria in the urinary tract. Identifying and treating a UTI will decrease the risk of preterm labor and developing more serious infections in both the mother and baby.  CAUSES  Bacteria germs cause almost all UTIs.   RISK FACTORS  Many factors can increase your chances of getting a UTI during pregnancy. These include:  · Having a short urethra.  · Poor toilet and hygiene habits.  · Sexual intercourse.  · Blockage of urine along the urinary tract.  · Problems with the pelvic muscles or nerves.  · Diabetes.  · Obesity.  · Bladder problems after having several children.  · Previous history of UTI.  SIGNS AND SYMPTOMS   · Pain, burning, or a stinging feeling when urinating.  · Suddenly feeling the need to urinate right away (urgency).  · Loss of bladder control (urinary incontinence).  · Frequent urination, more than is common with pregnancy.  · Lower abdominal or back discomfort.  · Cloudy urine.  · Blood in the urine (hematuria).  · Fever.   When the kidneys are infected, the symptoms may be:  · Back pain.  · Flank pain on the right side more so than the left.  · Fever.  · Chills.  · Nausea.  · Vomiting.  DIAGNOSIS   A urinary tract infection is usually diagnosed through urine tests. Additional tests and procedures are sometimes done. These may include:  · Ultrasound exam of the kidneys, ureters, bladder, and urethra.  · Looking in the bladder with a lighted tube (cystoscopy).  TREATMENT  Typically, UTIs can be treated with antibiotic medicines.   HOME CARE INSTRUCTIONS   · Only take over-the-counter or prescription medicines as directed by your health care provider. If you were prescribed antibiotics, take them as directed. Finish  them even if you start to feel better.  · Drink enough fluids to keep your urine clear or pale yellow.  · Do not have sexual intercourse until the infection is gone and your health care provider says it is okay.  · Make sure you are tested for UTIs throughout your pregnancy. These infections often come back.   Preventing a UTI in the Future  · Practice good toilet habits. Always wipe from front to back. Use the tissue only once.  · Do not hold your urine. Empty your bladder as soon as possible when the urge comes.  · Do not douche or use deodorant sprays.  · Wash with soap and warm water around the genital area and the anus.  · Empty your bladder before and after sexual intercourse.  · Wear underwear with a cotton crotch.  · Avoid caffeine and carbonated drinks. They can irritate the bladder.  · Drink cranberry juice or take cranberry pills. This may decrease the risk of getting a UTI.  · Do not drink alcohol.  · Keep all your appointments and tests as scheduled.   SEEK MEDICAL CARE IF:   · Your symptoms get worse.  · You are still having fevers 2 or more days after treatment begins.  · You have a rash.  · You feel that you are having problems with medicines prescribed.  · You have abnormal vaginal discharge.  SEEK IMMEDIATE MEDICAL CARE IF:   · You have back or flank   pain.  · You have chills.  · You have blood in your urine.  · You have nausea and vomiting.  · You have contractions of your uterus.  · You have a gush of fluid from the vagina.  MAKE SURE YOU:  · Understand these instructions.    · Will watch your condition.    · Will get help right away if you are not doing well or get worse.    Document Released: 07/02/2010 Document Revised: 12/26/2012 Document Reviewed: 10/04/2012  [employer]® Patient Information ©2014 [employer].
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
## 2013-04-22 ENCOUNTER — Encounter: Payer: Self-pay | Admitting: Obstetrics

## 2013-04-22 ENCOUNTER — Ambulatory Visit (INDEPENDENT_AMBULATORY_CARE_PROVIDER_SITE_OTHER): Payer: 59 | Admitting: Obstetrics

## 2013-04-22 ENCOUNTER — Other Ambulatory Visit: Payer: Medicaid Other

## 2013-04-22 VITALS — BP 106/68 | Temp 98.0°F | Wt 138.0 lb

## 2013-04-22 DIAGNOSIS — Z3480 Encounter for supervision of other normal pregnancy, unspecified trimester: Secondary | ICD-10-CM

## 2013-04-22 DIAGNOSIS — R82998 Other abnormal findings in urine: Secondary | ICD-10-CM

## 2013-04-22 DIAGNOSIS — R8290 Unspecified abnormal findings in urine: Secondary | ICD-10-CM

## 2013-04-22 LAB — POCT URINALYSIS DIPSTICK
BILIRUBIN UA: NEGATIVE
Blood, UA: NEGATIVE
GLUCOSE UA: NEGATIVE
Ketones, UA: NEGATIVE
NITRITE UA: POSITIVE
Protein, UA: NEGATIVE
Spec Grav, UA: 1.015
Urobilinogen, UA: NEGATIVE
pH, UA: 7

## 2013-04-22 LAB — CBC
HEMATOCRIT: 32.7 % — AB (ref 36.0–46.0)
Hemoglobin: 10.7 g/dL — ABNORMAL LOW (ref 12.0–15.0)
MCH: 27.6 pg (ref 26.0–34.0)
MCHC: 32.7 g/dL (ref 30.0–36.0)
MCV: 84.3 fL (ref 78.0–100.0)
Platelets: 283 10*3/uL (ref 150–400)
RBC: 3.88 MIL/uL (ref 3.87–5.11)
RDW: 15.5 % (ref 11.5–15.5)
WBC: 4.8 10*3/uL (ref 4.0–10.5)

## 2013-04-22 LAB — GC/CHLAMYDIA PROBE AMP
CT Probe RNA: NEGATIVE
GC PROBE AMP APTIMA: NEGATIVE

## 2013-04-22 NOTE — Progress Notes (Signed)
Pulse: 64 Patient would like to know when her next ultrasound will be.

## 2013-04-23 LAB — HIV ANTIBODY (ROUTINE TESTING W REFLEX): HIV: NONREACTIVE

## 2013-04-23 LAB — URINE CULTURE

## 2013-04-23 LAB — GLUCOSE TOLERANCE, 2 HOURS W/ 1HR
Glucose, 1 hour: 106 mg/dL (ref 70–170)
Glucose, 2 hour: 94 mg/dL (ref 70–139)
Glucose, Fasting: 57 mg/dL — ABNORMAL LOW (ref 70–99)

## 2013-04-23 LAB — RPR

## 2013-04-24 LAB — CULTURE, OB URINE: Colony Count: >=100000

## 2013-05-13 ENCOUNTER — Ambulatory Visit (INDEPENDENT_AMBULATORY_CARE_PROVIDER_SITE_OTHER): Payer: Medicaid Other | Admitting: Obstetrics

## 2013-05-13 ENCOUNTER — Encounter: Payer: Self-pay | Admitting: Obstetrics

## 2013-05-13 VITALS — BP 116/67 | Temp 98.5°F | Wt 140.0 lb

## 2013-05-13 DIAGNOSIS — B3731 Acute candidiasis of vulva and vagina: Secondary | ICD-10-CM

## 2013-05-13 DIAGNOSIS — Z3480 Encounter for supervision of other normal pregnancy, unspecified trimester: Secondary | ICD-10-CM

## 2013-05-13 DIAGNOSIS — B373 Candidiasis of vulva and vagina: Secondary | ICD-10-CM

## 2013-05-13 LAB — POCT URINALYSIS DIPSTICK
Bilirubin, UA: NEGATIVE
GLUCOSE UA: NEGATIVE
Ketones, UA: NEGATIVE
Leukocytes, UA: NEGATIVE
NITRITE UA: NEGATIVE
PH UA: 8
Protein, UA: NEGATIVE
RBC UA: NEGATIVE
SPEC GRAV UA: 1.01
Urobilinogen, UA: NEGATIVE

## 2013-05-13 MED ORDER — FLUCONAZOLE 150 MG PO TABS: 150.0000 mg | ORAL_TABLET | Freq: Once | ORAL | Status: DC

## 2013-05-13 NOTE — Progress Notes (Signed)
Pulse 87 Pt states that she may have a yeast infection.  Pt states that she is having vaginal itching with thick white d/c.

## 2013-05-20 ENCOUNTER — Encounter: Payer: 59 | Admitting: Obstetrics

## 2013-05-27 ENCOUNTER — Encounter: Payer: 59 | Admitting: Obstetrics

## 2013-06-15 ENCOUNTER — Inpatient Hospital Stay (HOSPITAL_COMMUNITY)
Admission: AD | Admit: 2013-06-15 | Discharge: 2013-06-15 | Disposition: A | Discharge status: Home / Self Care | Payer: Medicaid Other | Source: Ambulatory Visit | Attending: Obstetrics | Admitting: Obstetrics

## 2013-06-15 ENCOUNTER — Encounter (HOSPITAL_COMMUNITY): Payer: Self-pay

## 2013-06-15 DIAGNOSIS — R1084 Generalized abdominal pain: Secondary | ICD-10-CM

## 2013-06-15 DIAGNOSIS — O99891 Other specified diseases and conditions complicating pregnancy: Secondary | ICD-10-CM | POA: Insufficient documentation

## 2013-06-15 DIAGNOSIS — O9989 Other specified diseases and conditions complicating pregnancy, childbirth and the puerperium: Principal | ICD-10-CM

## 2013-06-15 DIAGNOSIS — F32A Depression, unspecified: Secondary | ICD-10-CM | POA: Clinically undetermined

## 2013-06-15 DIAGNOSIS — N898 Other specified noninflammatory disorders of vagina: Secondary | ICD-10-CM | POA: Insufficient documentation

## 2013-06-15 DIAGNOSIS — N949 Unspecified condition associated with female genital organs and menstrual cycle: Secondary | ICD-10-CM

## 2013-06-15 DIAGNOSIS — R109 Unspecified abdominal pain: Secondary | ICD-10-CM | POA: Insufficient documentation

## 2013-06-15 DIAGNOSIS — F329 Major depressive disorder, single episode, unspecified: Secondary | ICD-10-CM | POA: Clinically undetermined

## 2013-06-15 LAB — URINALYSIS, ROUTINE W REFLEX MICROSCOPIC
Bilirubin Urine: NEGATIVE
Glucose, UA: NEGATIVE mg/dL
Hgb urine dipstick: NEGATIVE
Ketones, ur: NEGATIVE mg/dL
Nitrite: NEGATIVE
Protein, ur: NEGATIVE mg/dL
SPECIFIC GRAVITY, URINE: 1.02 (ref 1.005–1.030)
UROBILINOGEN UA: 0.2 mg/dL (ref 0.0–1.0)
pH: 7 (ref 5.0–8.0)

## 2013-06-15 LAB — WET PREP, GENITAL
CLUE CELLS WET PREP: NONE SEEN
TRICH WET PREP: NONE SEEN
YEAST WET PREP: NONE SEEN

## 2013-06-15 LAB — URINE MICROSCOPIC-ADD ON

## 2013-06-15 NOTE — MAU Note (Signed)
Patient states she has been having vaginal discharge for about one week. Started having abdominal pain yesterday. Denies bleeding. Reports good fetal movement.

## 2013-06-15 NOTE — MAU Provider Note (Signed)
Chief Complaint:  Abdominal Pain and Vaginal Discharge   First Provider Initiated Contact with Patient 06/15/13 1313      HPI: Sharon Johnston is a 23 y.o. G3P1011 at 262w3d who presents to maternity admissions reporting 2 day hx of suprapubic and groin intermittent sharp pains.  Denies upper abd pain, leakage of fluid or vaginal bleeding. Good fetal movement.   Pregnancy Course: Essentially uncomplicated  Past Medical History: Past Medical History  Diagnosis Date  . Depression     hospitalized for 2 weeks after suicide att  . Hx of gonorrhea     Past obstetric history: OB History  Gravida Para Term Preterm AB SAB TAB Ectopic Multiple Living  3 1 1  0 1 1 0 0 0 1    # Outcome Date GA Lbr Len/2nd Weight Sex Delivery Anes PTL Lv  3 CUR           2 TRM 10/18/11 858w4d 12:30 / 00:30 3.402 kg (7 lb 8 oz) M SVD EPI  Y  1 SAB  3869w0d            Comments: System Generated. Please review and update pregnancy details.      Past Surgical History: Past Surgical History  Procedure Laterality Date  . No past surgeries       Family History: Family History  Problem Relation Age of Onset  . Hypertension Mother   . Hypertension Maternal Aunt   . Hypertension Maternal Grandmother   . Depression Cousin     Social History: History  Substance Use Topics  . Smoking status: Never Smoker   . Smokeless tobacco: Never Used  . Alcohol Use: No    Allergies: No Known Allergies  Meds:  Prescriptions prior to admission  Medication Sig Dispense Refill  . fluconazole (DIFLUCAN) 150 MG tablet Take 1 tablet (150 mg total) by mouth once.  1 tablet  2  . Prenatal Vit-Fe Fumarate-FA (PRENATAL MULTIVITAMIN) TABS tablet Take 1 tablet by mouth daily at 12 noon.        ROS: Pertinent findings in history of present illness.  Physical Exam  Blood pressure 114/59, pulse 97, temperature 98.5 F (36.9 C), temperature source Oral, resp. rate 16, height 5' 2.5" (1.588 m), weight 64.411 kg (142 lb), last  menstrual period 11/07/2012, SpO2 97.00%. GENERAL: Well-developed, well-nourished female in no acute distress.  HEENT: normocephalic HEART: normal rate RESP: normal effort ABDOMEN: Soft, non-tender, gravid appropriate for gestational age EXTREMITIES: Nontender, no edema NEURO: alert and oriented SPECULUM EXAM: NEFG, physiologic discharge, no blood, cervix clean Dilation: Closed Effacement (%): Thick Cervical Position: Posterior Station:  (high) Exam by:: D. Klair Leising, CNM  FHT:  Baseline 140, moderate variability, accelerations present, no decelerations Contractions: none   Labs: Results for orders placed during the hospital encounter of 06/15/13 (from the past 24 hour(s))  URINALYSIS, ROUTINE W REFLEX MICROSCOPIC     Status: Abnormal   Collection Time    06/15/13 12:45 PM      Result Value Ref Range   Color, Urine YELLOW  YELLOW   APPearance CLEAR  CLEAR   Specific Gravity, Urine 1.020  1.005 - 1.030   pH 7.0  5.0 - 8.0   Glucose, UA NEGATIVE  NEGATIVE mg/dL   Hgb urine dipstick NEGATIVE  NEGATIVE   Bilirubin Urine NEGATIVE  NEGATIVE   Ketones, ur NEGATIVE  NEGATIVE mg/dL   Protein, ur NEGATIVE  NEGATIVE mg/dL   Urobilinogen, UA 0.2  0.0 - 1.0 mg/dL   Nitrite  NEGATIVE  NEGATIVE   Leukocytes, UA TRACE (*) NEGATIVE  URINE MICROSCOPIC-ADD ON     Status: Abnormal   Collection Time    06/15/13 12:45 PM      Result Value Ref Range   Squamous Epithelial / LPF MANY (*) RARE   WBC, UA 7-10  <3 WBC/hpf   Bacteria, UA MANY (*) RARE   Urine-Other MUCOUS PRESENT    WET PREP, GENITAL     Status: Abnormal   Collection Time    06/15/13  1:18 PM      Result Value Ref Range   Yeast Wet Prep HPF POC NONE SEEN  NONE SEEN   Trich, Wet Prep NONE SEEN  NONE SEEN   Clue Cells Wet Prep HPF POC NONE SEEN  NONE SEEN   WBC, Wet Prep HPF POC FEW (*) NONE SEEN    Imaging:  No results found.  MAU Course:   Assessment: 1. Round ligament pain   G3P1011 at [redacted]w[redacted]d Cat 1 FHR  Plan: Discharge  home with reassurance and RLP relief measures    Medication List    STOP taking these medications       fluconazole 150 MG tablet  Commonly known as:  DIFLUCAN      TAKE these medications       calcium carbonate 500 MG chewable tablet  Commonly known as:  TUMS - dosed in mg elemental calcium  Chew 2 tablets by mouth daily as needed for indigestion or heartburn.     prenatal multivitamin Tabs tablet  Take 1 tablet by mouth daily at 12 noon.       Follow-up Information   Follow up with [facility] On 06/19/2013.   Contact information:   [address] [place] [address] [phone]     Danae Orleans, CNM 06/15/2013 1:23 PM

## 2013-07-09 ENCOUNTER — Ambulatory Visit (INDEPENDENT_AMBULATORY_CARE_PROVIDER_SITE_OTHER): Payer: Medicaid Other | Admitting: Obstetrics

## 2013-07-09 VITALS — BP 123/76 | HR 82 | Temp 98.1°F | Wt 151.0 lb

## 2013-07-09 DIAGNOSIS — Z3480 Encounter for supervision of other normal pregnancy, unspecified trimester: Secondary | ICD-10-CM

## 2013-07-09 DIAGNOSIS — N390 Urinary tract infection, site not specified: Secondary | ICD-10-CM

## 2013-07-09 DIAGNOSIS — K219 Gastro-esophageal reflux disease without esophagitis: Secondary | ICD-10-CM

## 2013-07-09 DIAGNOSIS — J309 Allergic rhinitis, unspecified: Secondary | ICD-10-CM

## 2013-07-09 DIAGNOSIS — J302 Other seasonal allergic rhinitis: Secondary | ICD-10-CM

## 2013-07-09 LAB — POCT URINALYSIS DIPSTICK
BILIRUBIN UA: NEGATIVE
Glucose, UA: NEGATIVE
Ketones, UA: NEGATIVE
Nitrite, UA: POSITIVE
RBC UA: NEGATIVE
SPEC GRAV UA: 1.015
Urobilinogen, UA: NEGATIVE
pH, UA: 7

## 2013-07-10 ENCOUNTER — Encounter: Payer: Medicaid Other | Admitting: Obstetrics

## 2013-07-10 ENCOUNTER — Encounter: Payer: Self-pay | Admitting: Obstetrics

## 2013-07-10 MED ORDER — OMEPRAZOLE 20 MG PO CPDR: 20.0000 mg | DELAYED_RELEASE_CAPSULE | Freq: Two times a day (BID) | ORAL | Status: DC

## 2013-07-10 MED ORDER — LORATADINE 10 MG PO TABS: 10.0000 mg | ORAL_TABLET | Freq: Every day | ORAL | Status: DC

## 2013-07-10 NOTE — Progress Notes (Signed)
Subjective:    Sharon Johnston is a 23 y.o. female being seen today for her obstetrical visit. She is at 2585w0d gestation. Patient reports heartburn and seasonal rhinitis. Fetal movement: normal.   Past Medical History  Diagnosis Date  . Depression     hospitalized for 2 weeks after suicide att  . Hx of gonorrhea     Past Surgical History  Procedure Laterality Date  . No past surgeries      Current outpatient prescriptions:calcium carbonate (TUMS - DOSED IN MG ELEMENTAL CALCIUM) 500 MG chewable tablet, Chew 2 tablets by mouth daily as needed for indigestion or heartburn., Disp: , Rfl: ;  Prenatal Vit-Fe Fumarate-FA (PRENATAL MULTIVITAMIN) TABS tablet, Take 1 tablet by mouth daily at 12 noon., Disp: , Rfl:  No Known Allergies  History  Substance Use Topics  . Smoking status: Never Smoker   . Smokeless tobacco: Never Used  . Alcohol Use: No    Family History  Problem Relation Age of Onset  . Hypertension Mother   . Hypertension Maternal Aunt   . Hypertension Maternal Grandmother   . Depression Cousin      Review of Systems Constitutional: negative for anorexia Gastrointestinal: negative for abdominal pain Genitourinary:negative for vaginal discharge Musculoskeletal:negative for back pain Behavioral/Psych: negative for depression and tobacco use   Objective:    BP 123/76  Pulse 82  Temp(Src) 98.1 F (36.7 C)  Wt 151 lb (68.493 kg)  LMP 11/07/2012 FHT:  150 BPM  Uterine Size: size equals dates  Presentation: unsure     Assessment:    Pregnancy @ 7985w0d weeks   Seasonal rhinitis  GERD  Plan:    Claritin Rx for allergic rhinitis  Omeprazole Rx for GERD   labs reviewed, problem list updated Consent signed. GBS sent TDAP offered  Rhogam given for RH negative Pediatrician: discussed. Infant feeding: plans to breastfeed. Maternity leave: discussed. Cigarette smoking: never smoked. Orders Placed This Encounter  Procedures  . Culture, OB Urine  . POCT  urinalysis dipstick   No orders of the defined types were placed in this encounter.   Follow up in 1 Week.

## 2013-07-12 LAB — CULTURE, OB URINE

## 2013-07-18 ENCOUNTER — Encounter: Payer: Self-pay | Admitting: Obstetrics

## 2013-07-18 ENCOUNTER — Other Ambulatory Visit: Payer: Self-pay | Admitting: *Deleted

## 2013-07-18 ENCOUNTER — Ambulatory Visit (INDEPENDENT_AMBULATORY_CARE_PROVIDER_SITE_OTHER): Payer: Medicaid Other | Admitting: Obstetrics

## 2013-07-18 VITALS — BP 148/75 | HR 76 | Wt 151.6 lb

## 2013-07-18 DIAGNOSIS — O2340 Unspecified infection of urinary tract in pregnancy, unspecified trimester: Secondary | ICD-10-CM

## 2013-07-18 DIAGNOSIS — Z3480 Encounter for supervision of other normal pregnancy, unspecified trimester: Secondary | ICD-10-CM

## 2013-07-18 LAB — POCT URINALYSIS DIPSTICK
Bilirubin, UA: NEGATIVE
Blood, UA: NEGATIVE
Glucose, UA: NEGATIVE
KETONES UA: NEGATIVE
NITRITE UA: NEGATIVE
PH UA: 6.5
Spec Grav, UA: 1.015
UROBILINOGEN UA: NEGATIVE

## 2013-07-18 MED ORDER — SULFAMETHOXAZOLE-TMP DS 800-160 MG PO TABS: 1.0000 | ORAL_TABLET | Freq: Two times a day (BID) | ORAL | Status: DC

## 2013-07-18 NOTE — Progress Notes (Signed)
Subjective:    Sharon Johnston is a 23 y.o. female being seen today for her obstetrical visit. She is at 4724w1d gestation. Patient reports no complaints. Fetal movement: normal.  Problem List Items Addressed This Visit   None    Visit Diagnoses   Supervision of other normal pregnancy    -  Primary    Relevant Orders       Strep B DNA probe       POCT urinalysis dipstick (Completed)      Patient Active Problem List   Diagnosis Date Noted  . Depression 06/15/2013  . Candidiasis of vulva and vagina 05/13/2013  . Vaginitis and vulvovaginitis, unspecified 03/25/2013   Objective:    BP 148/75  Pulse 76  Wt 151 lb 9.6 oz (68.765 kg)  LMP 11/07/2012 FHT:  150 BPM  Uterine Size: size equals dates  Presentation: unsure     Assessment:    Pregnancy @ 6624w1d weeks   Plan:     labs reviewed, problem list updated Consent signed. GBS sent TDAP offered  Rhogam given for RH negative Pediatrician: discussed. Infant feeding: plans to breastfeed. Maternity leave: not discussed. Cigarette smoking: never smoked. Orders Placed This Encounter  Procedures  . Strep B DNA probe  . POCT urinalysis dipstick   No orders of the defined types were placed in this encounter.   Follow up in 1 Week.

## 2013-07-20 LAB — STREP B DNA PROBE: STREP GROUP B AG: NEGATIVE

## 2013-07-25 ENCOUNTER — Ambulatory Visit (INDEPENDENT_AMBULATORY_CARE_PROVIDER_SITE_OTHER): Payer: Medicaid Other | Admitting: Obstetrics

## 2013-07-25 VITALS — BP 122/63 | HR 78 | Temp 98.5°F | Wt 150.0 lb

## 2013-07-25 DIAGNOSIS — Z3480 Encounter for supervision of other normal pregnancy, unspecified trimester: Secondary | ICD-10-CM

## 2013-07-25 LAB — POCT URINALYSIS DIPSTICK
Glucose, UA: NEGATIVE
Ketones, UA: NEGATIVE
Leukocytes, UA: NEGATIVE
Nitrite, UA: NEGATIVE
PROTEIN UA: NEGATIVE
RBC UA: NEGATIVE
Spec Grav, UA: 1.025
pH, UA: 6

## 2013-07-26 ENCOUNTER — Encounter: Payer: Self-pay | Admitting: Obstetrics

## 2013-07-26 NOTE — Progress Notes (Signed)
Subjective:    Sharon Johnston is a [age] y.o. female being seen today for her obstetrical visit. She is at 494w2d gestation. Patient reports no complaints. Fetal movement: normal.  Problem List Items Addressed This Visit   None    Visit Diagnoses   Supervision of other normal pregnancy    -  Primary    Relevant Orders       POCT urinalysis dipstick (Completed)      Patient Active Problem List   Diagnosis Date Noted  . Depression 06/15/2013  . Candidiasis of vulva and vagina 05/13/2013  . Vaginitis and vulvovaginitis, unspecified 03/25/2013    Objective:    BP 122/63  Pulse 78  Temp(Src) 98.5 F (36.9 C)  Wt 150 lb (68.04 kg)  LMP 11/07/2012 FHT: 150 BPM  Uterine Size: size equals dates  Presentations: unsure  Pelvic Exam: Deferred    Assessment:    Pregnancy @ 7994w2d weeks   Plan:   Plans for delivery: Vaginal anticipated; labs reviewed; problem list updated Counseling: Consent signed. Infant feeding: plans to breastfeed. Cigarette smoking: never smoked. L&D discussion: symptoms of labor, discussed when to call, discussed what number to call, anesthetic/analgesic options reviewed and delivering clinician:  plans Physician. Postpartum supports and preparation: circumcision discussed and contraception plans discussed.  Follow up in 1 Week.

## 2013-08-01 ENCOUNTER — Ambulatory Visit (INDEPENDENT_AMBULATORY_CARE_PROVIDER_SITE_OTHER): Payer: Medicaid Other | Admitting: Obstetrics

## 2013-08-01 VITALS — BP 130/80 | HR 86 | Temp 98.2°F | Wt 156.0 lb

## 2013-08-01 DIAGNOSIS — Z3480 Encounter for supervision of other normal pregnancy, unspecified trimester: Secondary | ICD-10-CM

## 2013-08-01 LAB — POCT URINALYSIS DIPSTICK
Blood, UA: NEGATIVE
GLUCOSE UA: NEGATIVE
Ketones, UA: NEGATIVE
Leukocytes, UA: NEGATIVE
Nitrite, UA: NEGATIVE
PH UA: 7
SPEC GRAV UA: 1.02

## 2013-08-02 ENCOUNTER — Encounter: Payer: Self-pay | Admitting: Obstetrics

## 2013-08-02 NOTE — Progress Notes (Signed)
Subjective:    Sharon Johnston is a 23 y.o. female being seen today for her obstetrical visit. She is at 241w2d gestation. Patient reports no complaints. Fetal movement: normal.  Problem List Items Addressed This Visit   None    Visit Diagnoses   Supervision of other normal pregnancy    -  Primary    Relevant Orders       POCT urinalysis dipstick (Completed)      Patient Active Problem List   Diagnosis Date Noted  . Depression 06/15/2013  . Candidiasis of vulva and vagina 05/13/2013  . Vaginitis and vulvovaginitis, unspecified 03/25/2013    Objective:    BP 130/80  Pulse 86  Temp(Src) 98.2 F (36.8 C)  Wt 156 lb (70.761 kg)  LMP 11/07/2012 FHT: 150 BPM  Uterine Size: size equals dates  Presentations: unsure  Pelvic Exam:   Deferred    Assessment:    Pregnancy @ 4941w2d weeks   Plan:   Plans for delivery: Vaginal anticipated; labs reviewed; problem list updated Counseling: Consent signed. Infant feeding: plans to breastfeed. Cigarette smoking: never smoked. L&D discussion: symptoms of labor, discussed when to call, discussed what number to call, anesthetic/analgesic options reviewed and delivering clinician:  plans Physician. Postpartum supports and preparation: circumcision discussed and contraception plans discussed.  Follow up in 1 Week.

## 2013-08-04 ENCOUNTER — Encounter (HOSPITAL_COMMUNITY): Payer: Self-pay | Admitting: *Deleted

## 2013-08-04 ENCOUNTER — Inpatient Hospital Stay (HOSPITAL_COMMUNITY)
Admission: AD | Admit: 2013-08-04 | Discharge: 2013-08-04 | Disposition: A | Discharge status: Home / Self Care | Payer: Medicaid Other | Source: Ambulatory Visit | Attending: Obstetrics | Admitting: Obstetrics

## 2013-08-04 DIAGNOSIS — O9989 Other specified diseases and conditions complicating pregnancy, childbirth and the puerperium: Principal | ICD-10-CM

## 2013-08-04 DIAGNOSIS — O99891 Other specified diseases and conditions complicating pregnancy: Secondary | ICD-10-CM | POA: Insufficient documentation

## 2013-08-04 NOTE — MAU Note (Signed)
Leaked clear fld with some "reddish" in it but no further leaking.. Had two contractions afterward and just cramping now

## 2013-08-04 NOTE — MAU Note (Signed)
PT SAYS SHE STARTED HURTING BAD AT 0100.     SHE THINKS  AT 0100-   WENT TO B-ROOM-  SAYS HER UNDERWEAR WERE WET .  SAYS NO MORE FLUID HAS COME OUT.    NO  VE IN OFFICE.   DENIES HSV AND MRSA.  GBS-  NEG.

## 2013-08-04 NOTE — MAU Provider Note (Signed)
S: [age] y.o. [id number] @[redacted]w[redacted]d  pt of Dr Clearance CootsHarper presents to MAU reporting leakage of fluid x1 episode while sitting on the toilet. She denies leakage since this episode and has not required a pad.  She reports good fetal movement, denies regular contractions, vaginal bleeding, vaginal itching/burning, urinary symptoms, h/a, dizziness, n/v, or fever/chills.    O: BP 141/79  Pulse 79  Temp(Src) 98.2 F (36.8 C)  Resp 20  Ht 5\' 3"  (1.6 m)  Wt 72.485 kg (159 lb 12.8 oz)  BMI 28.31 kg/m2  LMP 11/07/2012  Speculum exam with negative pooling  Ferning negative on slide  Dilation: Closed Effacement (%):  (LONG) Cervical Position: Posterior Exam by:: Leza Apsey, CNM  A: Intact membranes  P: RN to call attending  Sharen CounterLisa Leftwich-Kirby Certified Nurse-Midwife

## 2013-08-08 ENCOUNTER — Ambulatory Visit (INDEPENDENT_AMBULATORY_CARE_PROVIDER_SITE_OTHER): Payer: Medicaid Other | Admitting: Obstetrics

## 2013-08-08 ENCOUNTER — Encounter: Payer: Self-pay | Admitting: Obstetrics

## 2013-08-08 VITALS — BP 136/88 | HR 76 | Temp 98.1°F | Wt 159.0 lb

## 2013-08-08 DIAGNOSIS — Z3480 Encounter for supervision of other normal pregnancy, unspecified trimester: Secondary | ICD-10-CM

## 2013-08-08 LAB — POCT URINALYSIS DIPSTICK
BILIRUBIN UA: NEGATIVE
Blood, UA: NEGATIVE
Glucose, UA: NEGATIVE
Ketones, UA: NEGATIVE
NITRITE UA: NEGATIVE
Protein, UA: NEGATIVE
Spec Grav, UA: 1.015
Urobilinogen, UA: NEGATIVE
pH, UA: 6

## 2013-08-08 NOTE — Progress Notes (Signed)
Subjective:    Sharon Johnston is a 23 y.o. female being seen today for her obstetrical visit. She is at 6865w1d gestation. Patient reports occasional contractions. Fetal movement: normal.  Problem List Items Addressed This Visit   None    Visit Diagnoses   Supervision of other normal pregnancy    -  Primary    Relevant Orders       POCT urinalysis dipstick      Patient Active Problem List   Diagnosis Date Noted  . Depression 06/15/2013  . Candidiasis of vulva and vagina 05/13/2013  . Vaginitis and vulvovaginitis, unspecified 03/25/2013    Objective:    BP 136/88  Pulse 76  Temp(Src) 98.1 F (36.7 C)  Wt 159 lb (72.122 kg)  LMP 11/07/2012 FHT: 150 BPM  Uterine Size: size equals dates  Presentations: cephalic  Pelvic Exam:              Dilation: 2cm       Effacement: 50%             Station:  -2    Consistency: medium            Position: posterior     Assessment:    Pregnancy @ 5365w1d weeks   Plan:   Plans for delivery: Vaginal anticipated; labs reviewed; problem list updated Counseling: Consent signed. Infant feeding: plans to breastfeed. Cigarette smoking: never smoked. L&D discussion: symptoms of labor, discussed when to call, discussed what number to call, anesthetic/analgesic options reviewed and delivering clinician:  plans Physician. Postpartum supports and preparation: circumcision discussed and contraception plans discussed.  Follow up in 1 Week.

## 2013-08-10 ENCOUNTER — Encounter (HOSPITAL_COMMUNITY): Payer: Self-pay | Admitting: Family

## 2013-08-10 ENCOUNTER — Inpatient Hospital Stay (HOSPITAL_COMMUNITY)
Admission: AD | Admit: 2013-08-10 | Discharge: 2013-08-13 | DRG: 775 | Disposition: A | Discharge status: Home / Self Care | Payer: Medicaid Other | Source: Ambulatory Visit | Attending: Obstetrics & Gynecology | Admitting: Obstetrics & Gynecology

## 2013-08-10 ENCOUNTER — Inpatient Hospital Stay (HOSPITAL_COMMUNITY): Payer: Medicaid Other | Admitting: Anesthesiology

## 2013-08-10 ENCOUNTER — Encounter (HOSPITAL_COMMUNITY): Payer: Medicaid Other | Admitting: Anesthesiology

## 2013-08-10 DIAGNOSIS — R030 Elevated blood-pressure reading, without diagnosis of hypertension: Secondary | ICD-10-CM | POA: Diagnosis present

## 2013-08-10 DIAGNOSIS — O9989 Other specified diseases and conditions complicating pregnancy, childbirth and the puerperium: Principal | ICD-10-CM

## 2013-08-10 DIAGNOSIS — O99892 Other specified diseases and conditions complicating childbirth: Principal | ICD-10-CM | POA: Diagnosis present

## 2013-08-10 DIAGNOSIS — IMO0001 Reserved for inherently not codable concepts without codable children: Secondary | ICD-10-CM

## 2013-08-10 LAB — COMPREHENSIVE METABOLIC PANEL
ALT: 9 U/L (ref 0–35)
AST: 16 U/L (ref 0–37)
Albumin: 3.1 g/dL — ABNORMAL LOW (ref 3.5–5.2)
Alkaline Phosphatase: 141 U/L — ABNORMAL HIGH (ref 39–117)
BUN: 10 mg/dL (ref 6–23)
CALCIUM: 9.2 mg/dL (ref 8.4–10.5)
CO2: 22 mEq/L (ref 19–32)
CREATININE: 0.66 mg/dL (ref 0.50–1.10)
Chloride: 101 mEq/L (ref 96–112)
GFR calc non Af Amer: >90 mL/min (ref >90)
Glucose, Bld: 78 mg/dL (ref 70–99)
Potassium: 4.2 mEq/L (ref 3.7–5.3)
SODIUM: 136 meq/L — AB (ref 137–147)
TOTAL PROTEIN: 6.9 g/dL (ref 6.0–8.3)
Total Bilirubin: 0.6 mg/dL (ref 0.3–1.2)

## 2013-08-10 LAB — CBC
HEMATOCRIT: 35.2 % — AB (ref 36.0–46.0)
HEMOGLOBIN: 11.3 g/dL — AB (ref 12.0–15.0)
MCH: 27.6 pg (ref 26.0–34.0)
MCHC: 32.1 g/dL (ref 30.0–36.0)
MCV: 85.9 fL (ref 78.0–100.0)
Platelets: 246 10*3/uL (ref 150–400)
RBC: 4.1 MIL/uL (ref 3.87–5.11)
RDW: 13.6 % (ref 11.5–15.5)
WBC: 5.3 10*3/uL (ref 4.0–10.5)

## 2013-08-10 LAB — LACTATE DEHYDROGENASE: LDH: 183 U/L (ref 94–250)

## 2013-08-10 LAB — RPR

## 2013-08-10 MED ORDER — PHENYLEPHRINE 40 MCG/ML (10ML) SYRINGE FOR IV PUSH (FOR BLOOD PRESSURE SUPPORT)
PREFILLED_SYRINGE | INTRAVENOUS | Status: AC
  Filled 2013-08-10: qty 10

## 2013-08-10 MED ORDER — ONDANSETRON HCL 4 MG/2ML IJ SOLN: 4.0000 mg | Freq: Four times a day (QID) | INTRAMUSCULAR | Status: DC | PRN

## 2013-08-10 MED ORDER — EPHEDRINE 5 MG/ML INJ
10.0000 mg | INTRAVENOUS | Status: DC | PRN
  Filled 2013-08-10: qty 2

## 2013-08-10 MED ORDER — PHENYLEPHRINE 40 MCG/ML (10ML) SYRINGE FOR IV PUSH (FOR BLOOD PRESSURE SUPPORT)
80.0000 ug | PREFILLED_SYRINGE | INTRAVENOUS | Status: DC | PRN
  Filled 2013-08-10: qty 2

## 2013-08-10 MED ORDER — LACTATED RINGERS IV SOLN: 500.0000 mL | INTRAVENOUS | Status: DC | PRN

## 2013-08-10 MED ORDER — LACTATED RINGERS IV SOLN: 500.0000 mL | Freq: Once | INTRAVENOUS | Status: DC

## 2013-08-10 MED ORDER — IBUPROFEN 600 MG PO TABS: 600.0000 mg | ORAL_TABLET | Freq: Four times a day (QID) | ORAL | Status: DC | PRN

## 2013-08-10 MED ORDER — EPHEDRINE 5 MG/ML INJ
INTRAVENOUS | Status: AC
  Filled 2013-08-10: qty 4

## 2013-08-10 MED ORDER — BUTORPHANOL TARTRATE 1 MG/ML IJ SOLN
2.0000 mg | INTRAMUSCULAR | Status: DC | PRN
  Administered 2013-08-10: 2 mg via INTRAVENOUS
  Filled 2013-08-10 (×2): qty 2

## 2013-08-10 MED ORDER — LACTATED RINGERS IV SOLN
INTRAVENOUS | Status: DC
  Administered 2013-08-10: 17:00:00 via INTRAVENOUS

## 2013-08-10 MED ORDER — OXYTOCIN BOLUS FROM INFUSION
500.0000 mL | INTRAVENOUS | Status: DC
  Administered 2013-08-11: 500 mL via INTRAVENOUS

## 2013-08-10 MED ORDER — OXYTOCIN 40 UNITS IN LACTATED RINGERS INFUSION - SIMPLE MED: 62.5000 mL/h | INTRAVENOUS | Status: DC

## 2013-08-10 MED ORDER — LIDOCAINE HCL (PF) 1 % IJ SOLN
30.0000 mL | INTRAMUSCULAR | Status: DC | PRN
  Filled 2013-08-10: qty 30

## 2013-08-10 MED ORDER — CITRIC ACID-SODIUM CITRATE 334-500 MG/5ML PO SOLN: 30.0000 mL | ORAL | Status: DC | PRN

## 2013-08-10 MED ORDER — FENTANYL 2.5 MCG/ML BUPIVACAINE 1/10 % EPIDURAL INFUSION (WH - ANES): 14.0000 mL/h | INTRAMUSCULAR | Status: DC | PRN

## 2013-08-10 MED ORDER — ACETAMINOPHEN 325 MG PO TABS: 650.0000 mg | ORAL_TABLET | ORAL | Status: DC | PRN

## 2013-08-10 MED ORDER — OXYTOCIN 40 UNITS IN LACTATED RINGERS INFUSION - SIMPLE MED
1.0000 m[IU]/min | INTRAVENOUS | Status: DC
  Filled 2013-08-10: qty 1000

## 2013-08-10 MED ORDER — SODIUM BICARBONATE 8.4 % IV SOLN
INTRAVENOUS | Status: DC | PRN
  Administered 2013-08-10: 5 mL via EPIDURAL

## 2013-08-10 MED ORDER — FLEET ENEMA 7-19 GM/118ML RE ENEM: 1.0000 | ENEMA | RECTAL | Status: DC | PRN

## 2013-08-10 MED ORDER — TERBUTALINE SULFATE 1 MG/ML IJ SOLN: 0.2500 mg | Freq: Once | INTRAMUSCULAR | Status: AC | PRN

## 2013-08-10 MED ORDER — FENTANYL 2.5 MCG/ML BUPIVACAINE 1/10 % EPIDURAL INFUSION (WH - ANES)
INTRAMUSCULAR | Status: DC | PRN
  Administered 2013-08-10: 14 mL/h via EPIDURAL

## 2013-08-10 MED ORDER — OXYCODONE-ACETAMINOPHEN 5-325 MG PO TABS: 1.0000 | ORAL_TABLET | ORAL | Status: DC | PRN

## 2013-08-10 MED ORDER — DIPHENHYDRAMINE HCL 50 MG/ML IJ SOLN: 12.5000 mg | INTRAMUSCULAR | Status: DC | PRN

## 2013-08-10 MED ORDER — FENTANYL 2.5 MCG/ML BUPIVACAINE 1/10 % EPIDURAL INFUSION (WH - ANES)
INTRAMUSCULAR | Status: AC
  Filled 2013-08-10: qty 125

## 2013-08-10 NOTE — Anesthesia Procedure Notes (Signed)

## 2013-08-10 NOTE — MAU Note (Signed)
23 yo, G3P1 at [redacted]w[redacted]d, presents to MAU with c/o VB approximately one hour ago. States she has experienced irregular contraction pattern since last night. Reports +FM. Denies LOF.

## 2013-08-10 NOTE — Anesthesia Preprocedure Evaluation (Signed)

## 2013-08-11 ENCOUNTER — Encounter (HOSPITAL_COMMUNITY): Payer: Self-pay | Admitting: *Deleted

## 2013-08-11 LAB — CBC
HEMATOCRIT: 36.7 % (ref 36.0–46.0)
HEMOGLOBIN: 11.8 g/dL — AB (ref 12.0–15.0)
MCH: 27.9 pg (ref 26.0–34.0)
MCHC: 32.2 g/dL (ref 30.0–36.0)
MCV: 86.8 fL (ref 78.0–100.0)
Platelets: 231 10*3/uL (ref 150–400)
RBC: 4.23 MIL/uL (ref 3.87–5.11)
RDW: 13.6 % (ref 11.5–15.5)
WBC: 10.7 10*3/uL — ABNORMAL HIGH (ref 4.0–10.5)

## 2013-08-11 MED ORDER — BENZOCAINE-MENTHOL 20-0.5 % EX AERO
1.0000 "application " | INHALATION_SPRAY | CUTANEOUS | Status: DC | PRN
  Administered 2013-08-12: 1 via TOPICAL
  Filled 2013-08-11: qty 56

## 2013-08-11 MED ORDER — DIPHENHYDRAMINE HCL 25 MG PO CAPS
25.0000 mg | ORAL_CAPSULE | Freq: Four times a day (QID) | ORAL | Status: DC | PRN
Start: 2013-08-11 — End: 2013-08-13

## 2013-08-11 MED ORDER — TETANUS-DIPHTH-ACELL PERTUSSIS 5-2.5-18.5 LF-MCG/0.5 IM SUSP
0.5000 mL | Freq: Once | INTRAMUSCULAR | Status: AC
  Administered 2013-08-12: 0.5 mL via INTRAMUSCULAR

## 2013-08-11 MED ORDER — DIBUCAINE 1 % RE OINT
1.0000 "application " | TOPICAL_OINTMENT | RECTAL | Status: DC | PRN
  Administered 2013-08-12: 1 via RECTAL
  Filled 2013-08-11: qty 28

## 2013-08-11 MED ORDER — ONDANSETRON HCL 4 MG PO TABS
4.0000 mg | ORAL_TABLET | ORAL | Status: DC | PRN
Start: 2013-08-11 — End: 2013-08-13

## 2013-08-11 MED ORDER — ZOLPIDEM TARTRATE 5 MG PO TABS
5.0000 mg | ORAL_TABLET | Freq: Every evening | ORAL | Status: DC | PRN
Start: 2013-08-11 — End: 2013-08-13

## 2013-08-11 MED ORDER — ONDANSETRON HCL 4 MG/2ML IJ SOLN
4.0000 mg | INTRAMUSCULAR | Status: DC | PRN
Start: 2013-08-11 — End: 2013-08-13

## 2013-08-11 MED ORDER — OXYCODONE-ACETAMINOPHEN 5-325 MG PO TABS: 1.0000 | ORAL_TABLET | ORAL | Status: DC | PRN

## 2013-08-11 MED ORDER — FERROUS SULFATE 325 (65 FE) MG PO TABS
325.0000 mg | ORAL_TABLET | Freq: Two times a day (BID) | ORAL | Status: DC
  Administered 2013-08-11 – 2013-08-13 (×4): 325 mg via ORAL
  Filled 2013-08-11 (×4): qty 1

## 2013-08-11 MED ORDER — PRENATAL MULTIVITAMIN CH
1.0000 | ORAL_TABLET | Freq: Every day | ORAL | Status: DC
  Administered 2013-08-11 – 2013-08-13 (×3): 1 via ORAL
  Filled 2013-08-11 (×3): qty 1

## 2013-08-11 MED ORDER — IBUPROFEN 600 MG PO TABS
600.0000 mg | ORAL_TABLET | Freq: Four times a day (QID) | ORAL | Status: DC
  Administered 2013-08-11 – 2013-08-13 (×9): 600 mg via ORAL
  Filled 2013-08-11 (×9): qty 1

## 2013-08-11 MED ORDER — MEASLES, MUMPS & RUBELLA VAC ~~LOC~~ INJ
0.5000 mL | INJECTION | Freq: Once | SUBCUTANEOUS | Status: DC
  Filled 2013-08-11: qty 0.5

## 2013-08-11 MED ORDER — LANOLIN HYDROUS EX OINT: TOPICAL_OINTMENT | CUTANEOUS | Status: DC | PRN

## 2013-08-11 MED ORDER — WITCH HAZEL-GLYCERIN EX PADS
1.0000 "application " | MEDICATED_PAD | CUTANEOUS | Status: DC | PRN
  Administered 2013-08-12 (×2): 1 via TOPICAL

## 2013-08-11 MED ORDER — MAGNESIUM HYDROXIDE 400 MG/5ML PO SUSP: 30.0000 mL | ORAL | Status: DC | PRN

## 2013-08-11 MED ORDER — SENNOSIDES-DOCUSATE SODIUM 8.6-50 MG PO TABS
2.0000 | ORAL_TABLET | ORAL | Status: DC
  Administered 2013-08-11 – 2013-08-12 (×2): 2 via ORAL
  Filled 2013-08-11 (×2): qty 2

## 2013-08-11 NOTE — Anesthesia Postprocedure Evaluation (Signed)
Anesthesia Post Note  Patient: Sharon Johnston  Procedure(s) Performed: * No procedures listed *  Anesthesia type: Epidural  Patient location: Mother/Baby  Post pain: Pain level controlled  Post assessment: Post-op Vital signs reviewed  Last Vitals:  Filed Vitals:   08/11/13 0740  BP: 154/91  Pulse: 74  Temp: 36.9 C  Resp: 18    Post vital signs: Reviewed  Level of consciousness:alert  Complications: No apparent anesthesia complications

## 2013-08-11 NOTE — Progress Notes (Signed)
Assuming care of this patient at this time.
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
## 2013-08-12 LAB — CBC
HCT: 33.4 % — ABNORMAL LOW (ref 36.0–46.0)
Hemoglobin: 10.8 g/dL — ABNORMAL LOW (ref 12.0–15.0)
MCH: 27.8 pg (ref 26.0–34.0)
MCHC: 32.3 g/dL (ref 30.0–36.0)
MCV: 85.9 fL (ref 78.0–100.0)
Platelets: 230 K/uL (ref 150–400)
RBC: 3.89 MIL/uL (ref 3.87–5.11)
RDW: 13.6 % (ref 11.5–15.5)
WBC: 7.2 K/uL (ref 4.0–10.5)

## 2013-08-12 NOTE — Progress Notes (Signed)
Ur chart review completed.  

## 2013-08-12 NOTE — Progress Notes (Signed)
Patient ID: Sharon Johnston, female   DOB: [birth year], 23 y.o.   MRN: [id number] Post Partum Day 1 S/P spontaneous vaginal RH status/Rubella reviewed.  Feeding: unknown Subjective: No HA, SOB, CP, F/C, breast symptoms. Normal vaginal bleeding, no clots.     Objective: BP 147/91  Pulse 70  Temp(Src) 98.6 F (37 C) (Oral)  Resp 16  Ht 5\' 3"  (1.6 m)  Wt 72.122 kg (159 lb)  BMI 28.17 kg/m2  SpO2 99%  LMP 11/07/2012  Breastfeeding? Unknown  DTRS:+2  Physical Exam:  General: alert Lochia: appropriate Uterine Fundus: firm DVT Evaluation: No evidence of DVT seen on physical exam. Ext: No c/c/e  Recent Labs  08/11/13 0448 08/12/13 0600  HGB 11.8* 10.8*  HCT 36.7 33.4*      Assessment/Plan: 23 y.o.  PPD #1 .  normal postpartum exam Borderline elevated B/Ps--no signs/symptoms meeting severe criteria Continue current postpartum care Ambulate   LOS: 2 days   Antionette Char 08/12/2013, 9:41 AM
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
## 2013-08-13 MED ORDER — IBUPROFEN 600 MG PO TABS: 600.0000 mg | ORAL_TABLET | Freq: Four times a day (QID) | ORAL | Status: DC

## 2013-08-13 NOTE — Discharge Summary (Signed)
Obstetric Discharge Summary Reason for Admission: onset of labor Prenatal Procedures: none Intrapartum Procedures: spontaneous vaginal delivery Postpartum Procedures: none Complications-Operative and Postpartum: labile and mild elevated BP, asymptomatic Hemoglobin  Date Value Ref Range Status  08/12/2013 10.8* 12.0 - 15.0 g/dL Final     HCT  Date Value Ref Range Status  08/12/2013 33.4* 36.0 - 46.0 % Final   Subjective:  Patient denies HA, RUQ pain or vision changes.   Filed Vitals:   08/13/13 0841  BP: 139/90  Pulse: 67  Temp: 98.2 F (36.8 C)  Resp: 18   Filed Vitals:   08/12/13 0902 08/12/13 1830 08/13/13 0534 08/13/13 0841  BP: 147/91 153/69 130/69 139/90  Pulse: 70 80 81 67  Temp: 98.6 F (37 C) 98.3 F (36.8 C) 98 F (36.7 C) 98.2 F (36.8 C)  TempSrc: Oral Oral Oral Oral  Resp: 16 16 16 18   Height:      Weight:      SpO2: 99%       Physical Exam:  General: alert and cooperative Lochia: appropriate Uterine Fundus: firm Incision: NA DVT Evaluation: No evidence of DVT seen on physical exam.  Discharge Diagnoses: Term Pregnancy-delivered  Discharge Information: Date: 08/13/2013 Activity: pelvic rest Diet: routine Medications: Ibuprofen Condition: stable Instructions: refer to practice specific booklet Discharge to: home Reviewed warning signs of pre-eclampsia. RTC in 1 week for BP check.   Newborn Data: Live born female  Birth Weight: 7 lb 3.7 oz (3280 g) APGAR: 9, 9  Home with mother.  Alize Acy Dessa Phi 08/13/2013, 8:40 AM

## 2013-08-16 ENCOUNTER — Ambulatory Visit (INDEPENDENT_AMBULATORY_CARE_PROVIDER_SITE_OTHER): Payer: Medicaid Other | Admitting: Advanced Practice Midwife

## 2013-08-16 ENCOUNTER — Encounter: Payer: Self-pay | Admitting: Advanced Practice Midwife

## 2013-08-16 VITALS — BP 158/101 | HR 70 | Temp 98.6°F

## 2013-08-16 LAB — POCT URINALYSIS DIPSTICK
Bilirubin, UA: NEGATIVE
Blood, UA: 250
Glucose, UA: NEGATIVE
Ketones, UA: NEGATIVE
NITRITE UA: NEGATIVE
PH UA: 6.5
Spec Grav, UA: 1.01
UROBILINOGEN UA: NEGATIVE

## 2013-08-16 LAB — CBC
HCT: 35.6 % — ABNORMAL LOW (ref 36.0–46.0)
HEMOGLOBIN: 11.5 g/dL — AB (ref 12.0–15.0)
MCH: 27.1 pg (ref 26.0–34.0)
MCHC: 32.3 g/dL (ref 30.0–36.0)
MCV: 83.8 fL (ref 78.0–100.0)
Platelets: 336 10*3/uL (ref 150–400)
RBC: 4.25 MIL/uL (ref 3.87–5.11)
RDW: 13.9 % (ref 11.5–15.5)
WBC: 8.6 10*3/uL (ref 4.0–10.5)

## 2013-08-16 MED ORDER — AMLODIPINE BESYLATE 5 MG PO TABS: 5.0000 mg | ORAL_TABLET | Freq: Every day | ORAL | Status: DC

## 2013-08-16 MED ORDER — LABETALOL HCL 200 MG PO TABS: 200.0000 mg | ORAL_TABLET | Freq: Three times a day (TID) | ORAL | Status: DC

## 2013-08-16 NOTE — Progress Notes (Signed)
Subjective:     Sharon Johnston is a 23 y.o. female who presents for a postpartum visit. She is 1 week postpartum following a spontaneous vaginal delivery. I have fully reviewed the prenatal and intrapartum course. The delivery was at 38 gestational weeks. Outcome: spontaneous vaginal delivery. Anesthesia: epidural. Postpartum course has been complicated by elevated BP. Baby's course has been stable. Baby is feeding by bottle - uncertain. Bleeding thin lochia. Bowel function is normal. Bladder function is normal. Patient is not sexually active. Contraception method is Desires Nexplanon. Postpartum depression screening: negative.  The following portions of the patient's history were reviewed and updated as appropriate: allergies, current medications, past family history, past medical history, past social history, past surgical history and problem list.  Review of Systems A comprehensive review of systems was negative.   Objective:    BP 158/101  Pulse 70  Temp(Src) 98.6 F (37 C)  LMP 11/07/2012  General:  alert and cooperative       Trace Protein on UA  Assessment:     1 week postpartum exam. Pap smear not done at today's visit.  Hypertension, PIH labs pending  Plan:    1. Contraception: Nexplanon and plan once BP stable Orders Placed This Encounter  Procedures  . Creatinine, serum  . CBC  . Lactate dehydrogenase  . Protein / creatinine ratio, urine  . Comprehensive metabolic panel  . POCT urinalysis dipstick   Meds ordered this encounter  Medications  . labetalol (NORMODYNE) 200 MG tablet    Sig: Take 1 tablet (200 mg total) by mouth 3 (three) times daily.    Dispense:  90 tablet    Refill:  0    Order Specific Question:  Supervising Provider    Answer:  HARPER, CHARLES A [[id number]]  . amLODipine (NORVASC) 5 MG tablet    Sig: Take 1 tablet (5 mg total) by mouth daily.    Dispense:  30 tablet    Refill:  0    Order Specific Question:  Supervising Provider    Answer:   Coral Ceo A [[id number]]   Consulted w/ MD Clearance Coots. Will give patient medication. Patient is currently asymptomatic. Reviewed symptoms of preeclampsia. Patient to go to MAU w/ symptoms. BP to be checked this weekend. Unable to get home health nurse, patient to RTC on Monday for BP check.  2. Labs done and pending 3. Follow up in: 3 days or as needed.  4. Plan Nexplanon when stable.  Damaria Stofko Wilson Singer CNM

## 2013-08-17 LAB — COMPREHENSIVE METABOLIC PANEL
ALT: 20 U/L (ref 0–35)
AST: 24 U/L (ref 0–37)
Albumin: 3.4 g/dL — ABNORMAL LOW (ref 3.5–5.2)
Alkaline Phosphatase: 103 U/L (ref 39–117)
BUN: 15 mg/dL (ref 6–23)
CALCIUM: 9.1 mg/dL (ref 8.4–10.5)
CHLORIDE: 103 meq/L (ref 96–112)
CO2: 24 meq/L (ref 19–32)
Creat: 0.66 mg/dL (ref 0.50–1.10)
Glucose, Bld: 89 mg/dL (ref 70–99)
Potassium: 4.1 mEq/L (ref 3.5–5.3)
SODIUM: 137 meq/L (ref 135–145)
TOTAL PROTEIN: 6.5 g/dL (ref 6.0–8.3)
Total Bilirubin: 0.6 mg/dL (ref 0.2–1.2)

## 2013-08-17 LAB — LACTATE DEHYDROGENASE: LDH: 250 U/L (ref 94–250)

## 2013-08-17 LAB — CREATININE, SERUM: Creat: 0.66 mg/dL (ref 0.50–1.10)

## 2013-08-17 LAB — PROTEIN / CREATININE RATIO, URINE
Creatinine, Urine: 163.3 mg/dL
Protein Creatinine Ratio: 0.13 (ref <0.15)
Total Protein, Urine: 22 mg/dL

## 2013-08-19 ENCOUNTER — Ambulatory Visit: Payer: Medicaid Other | Admitting: *Deleted

## 2013-08-19 VITALS — BP 137/96 | HR 70 | Temp 98.6°F | Wt 142.0 lb

## 2013-08-19 DIAGNOSIS — O139 Gestational [pregnancy-induced] hypertension without significant proteinuria, unspecified trimester: Secondary | ICD-10-CM

## 2013-08-19 NOTE — Progress Notes (Signed)
Pt in office for BP check and medication check.  Pt states that she was unable to pick up medication at pharmacy.  Pt states that pharmacy did not have her insurance information on file and she did not have a card to give them.  Pt states that medication was to expensive to pay out of pocket for.  Pt BP is elevated today in office, noted less than last visit.  Pt is advised that we can help her with her insurance needs and to call office if she has any other problems with pharmacy and medication.  Pt is to RTO on 08/20/13 for PP visit and Nexplanon.   BP 137/96  Pulse 70  Temp(Src) 98.6 F (37 C)  Wt 142 lb (64.411 kg)  LMP 11/07/2012

## 2013-08-20 ENCOUNTER — Ambulatory Visit: Payer: Medicaid Other | Admitting: Advanced Practice Midwife

## 2013-10-02 ENCOUNTER — Ambulatory Visit: Payer: Medicaid Other | Admitting: Obstetrics

## 2013-10-15 ENCOUNTER — Ambulatory Visit: Payer: Medicaid Other | Admitting: Obstetrics

## 2013-10-24 ENCOUNTER — Encounter: Payer: Self-pay | Admitting: Obstetrics

## 2013-10-24 ENCOUNTER — Ambulatory Visit (INDEPENDENT_AMBULATORY_CARE_PROVIDER_SITE_OTHER): Payer: Medicaid Other | Admitting: Obstetrics

## 2013-10-24 DIAGNOSIS — Z3009 Encounter for other general counseling and advice on contraception: Secondary | ICD-10-CM

## 2013-10-24 DIAGNOSIS — Z3202 Encounter for pregnancy test, result negative: Secondary | ICD-10-CM

## 2013-10-24 LAB — POCT URINE PREGNANCY: PREG TEST UR: NEGATIVE

## 2013-10-24 NOTE — Progress Notes (Signed)
Subjective:     Sharon Johnston is a 23 y.o. fLanae Johnston who presents for a postpartum visit. She is 1 week postpartum following a spontaneous vaginal delivery. I have fully reviewed the prenatal and intrapartum course. The delivery was at 40 gestational weeks. Outcome: spontaneous vaginal delivery. Anesthesia: epidural. Postpartum course has been normal. Baby's course has been normal. Baby is feeding by bottle Rush Barer- Gerber. Bleeding thin lochia. Bowel function is normal. Bladder function is normal. Patient is not sexually active. Contraception method is abstinence. Postpartum depression screening: negative.  Tobacco, alcohol and substance abuse history reviewed.  Adult immunizations reviewed including TDAP, rubella and varicella.  The following portions of the patient's history were reviewed and updated as appropriate: allergies, current medications, past family history, past medical history, past social history, past surgical history and problem list.  Review of Systems A comprehensive review of systems was negative.   Objective:    BP 129/89  Pulse 70  Temp(Src) 98.1 F (36.7 C)  Ht 5\' 3"  (1.6 m)  Wt 124 lb (56.246 kg)  BMI 21.97 kg/m2  LMP 10/22/2013  Breastfeeding? No   PE:  Deferred   100% of 10 min visit spent on counseling and coordination of care.  Assessment:    Postpartum, 1 week.  Doing well.  Contraceptive counseling.  Plan:    1. Contraception: abstinence 2. Nexplanon Rx 3. Follow up in: 1 week or as needed.  2hr GTT for h/o GDM/screening for DM q 3 yrs per ADA recommendations Preconception counseling provided Healthy lifestyle practices reviewed

## 2013-10-24 NOTE — Addendum Note (Signed)
Addended by: Elby BeckPAUL, Aundria Bitterman F on: 10/24/2013 02:28 PM   Modules accepted: Orders

## 2013-10-31 ENCOUNTER — Ambulatory Visit (INDEPENDENT_AMBULATORY_CARE_PROVIDER_SITE_OTHER): Payer: Medicaid Other | Admitting: Obstetrics

## 2013-10-31 ENCOUNTER — Other Ambulatory Visit: Payer: Self-pay | Admitting: Obstetrics

## 2013-10-31 ENCOUNTER — Encounter: Payer: Self-pay | Admitting: Obstetrics

## 2013-10-31 VITALS — BP 138/100 | Temp 98.6°F | Ht 63.0 in | Wt 117.0 lb

## 2013-10-31 DIAGNOSIS — Z3009 Encounter for other general counseling and advice on contraception: Secondary | ICD-10-CM

## 2013-10-31 DIAGNOSIS — Z113 Encounter for screening for infections with a predominantly sexual mode of transmission: Secondary | ICD-10-CM

## 2013-11-01 ENCOUNTER — Encounter: Payer: Self-pay | Admitting: Obstetrics

## 2013-11-01 LAB — HIV ANTIBODY (ROUTINE TESTING W REFLEX): HIV 1&2 Ab, 4th Generation: NONREACTIVE

## 2013-11-01 LAB — GC/CHLAMYDIA PROBE AMP
CT PROBE, AMP APTIMA: NEGATIVE
GC PROBE AMP APTIMA: NEGATIVE

## 2013-11-01 LAB — HEPATITIS C ANTIBODY: HCV Ab: NEGATIVE

## 2013-11-01 LAB — WET PREP BY MOLECULAR PROBE
Candida species: NEGATIVE
GARDNERELLA VAGINALIS: POSITIVE — AB
TRICHOMONAS VAG: NEGATIVE

## 2013-11-01 LAB — HEPATITIS B SURFACE ANTIGEN: Hepatitis B Surface Ag: NEGATIVE

## 2013-11-01 LAB — RPR

## 2013-11-01 NOTE — Progress Notes (Signed)
Subjective:     Sharon Johnston is a 23 y.o. female here for a routine exam.  Current complaints: none.    Personal health questionnaire:  Is patient Sharon Johnston, have a family history of breast and/or ovarian cancer: no Is there a family history of uterine cancer diagnosed at age < 7550, gastrointestinal cancer, urinary tract cancer, family member who is a Personnel officerLynch syndrome-associated carrier: no Is the patient overweight and hypertensive, family history of diabetes, personal history of gestational diabetes or PCOS: no Is patient over 3355, have PCOS,  family history of premature CHD under age 21, diabetes, smoke, have hypertension or peripheral artery disease:  no At any time, has a partner hit, kicked or otherwise hurt or frightened you?: no Over the past 2 weeks, have you felt down, depressed or hopeless?: no Over the past 2 weeks, have you felt little interest or pleasure in doing things?:no   Gynecologic History Patient's last menstrual period was 10/22/2013. Contraception: condoms Last Pap: none. Results were: n/a Last mammogram: n/a. Results were: n/a  Obstetric History OB History  Gravida Para Term Preterm AB SAB TAB Ectopic Multiple Living  3 2 2  0 1 1 0 0 0 2    # Outcome Date GA Lbr Len/2nd Weight Sex Delivery Anes PTL Lv  3 TRM 08/11/13 [id number] 13:48 / 00:08 7 lb 3.7 oz (3.28 kg) F SVD EPI  Y  2 TRM 10/18/11 1569w4d 12:30 / 00:30 7 lb 8 oz (3.402 kg) M SVD EPI  Y  1 SAB  [id number]            Comments: System Generated. Please review and update pregnancy details.      Past Medical History  Diagnosis Date  . Depression     hospitalized for 2 weeks after suicide att  . Hx of gonorrhea     Past Surgical History  Procedure Laterality Date  . No past surgeries      No current outpatient prescriptions on file. No Known Allergies  History  Substance Use Topics  . Smoking status: Never Smoker   . Smokeless tobacco: Never Used  . Alcohol Use: No    Family History  Problem  Relation Age of Onset  . Hypertension Mother   . Hypertension Maternal Aunt   . Hypertension Maternal Grandmother   . Depression Cousin       Review of Systems  Constitutional: negative for fatigue and weight loss Respiratory: negative for cough and wheezing Cardiovascular: negative for chest pain, fatigue and palpitations Gastrointestinal: negative for abdominal pain and change in bowel habits Musculoskeletal:negative for myalgias Neurological: negative for gait problems and tremors Behavioral/Psych: negative for abusive relationship, depression Endocrine: negative for temperature intolerance   Genitourinary:negative for abnormal menstrual periods, genital lesions, hot flashes, sexual problems and vaginal discharge Integument/breast: negative for breast lump, breast tenderness, nipple discharge and skin lesion(s)    Objective:       BP 138/100  Temp(Src) 98.6 F (37 C)  Ht 5\' 3"  (1.6 m)  Wt 117 lb (53.071 kg)  BMI 20.73 kg/m2  LMP 10/22/2013  Breastfeeding? No General:   alert  Skin:   no rash or abnormalities  Lungs:   clear to auscultation bilaterally  Heart:   regular rate and rhythm, S1, S2 normal, no murmur, click, rub or gallop  Breasts:   normal without suspicious masses, skin or nipple changes or axillary nodes  Abdomen:  normal findings: no organomegaly, soft, non-tender and no hernia  Pelvis:  External  genitalia: normal general appearance Urinary system: urethral meatus normal and bladder without fullness, nontender Vaginal: normal without tenderness, induration or masses Cervix: normal appearance Adnexa: normal bimanual exam Uterus: anteverted and non-tender, normal size   Lab Review Urine pregnancy test Labs reviewed yes Radiologic studies reviewed no    Assessment:    Healthy female exam.    Plan:    Education reviewed: safe sex/STD prevention. Contraception: condoms. Follow up in: several months.   No orders of the defined types were  placed in this encounter.   Orders Placed This Encounter  Procedures  . WET PREP BY MOLECULAR PROBE  . GC/Chlamydia Probe Amp  . HIV antibody  . Hepatitis B surface antigen  . RPR  . Hepatitis C antibody

## 2013-11-06 ENCOUNTER — Other Ambulatory Visit: Payer: Self-pay | Admitting: Obstetrics

## 2013-11-06 DIAGNOSIS — N760 Acute vaginitis: Principal | ICD-10-CM

## 2013-11-06 DIAGNOSIS — B9689 Other specified bacterial agents as the cause of diseases classified elsewhere: Secondary | ICD-10-CM

## 2013-11-06 MED ORDER — METRONIDAZOLE 500 MG PO TABS: 500.0000 mg | ORAL_TABLET | Freq: Two times a day (BID) | ORAL | Status: DC

## 2013-11-06 NOTE — Progress Notes (Signed)
Flagyl Rx 

## 2013-11-13 ENCOUNTER — Encounter: Payer: Self-pay | Admitting: *Deleted

## 2013-11-21 ENCOUNTER — Encounter (HOSPITAL_COMMUNITY): Payer: Self-pay | Admitting: Emergency Medicine

## 2013-11-21 DIAGNOSIS — R51 Headache: Secondary | ICD-10-CM | POA: Insufficient documentation

## 2013-11-21 DIAGNOSIS — R110 Nausea: Secondary | ICD-10-CM | POA: Insufficient documentation

## 2013-11-21 NOTE — ED Notes (Signed)
Pt in c/o headache nausea and feeling lightheaded since this am, denies vomiting, no distress noted

## 2013-11-22 ENCOUNTER — Emergency Department (HOSPITAL_COMMUNITY)
Admission: EM | Admit: 2013-11-22 | Discharge: 2013-11-22 | Discharge status: Against Medical Advice | Payer: Medicaid Other | Attending: Emergency Medicine | Admitting: Emergency Medicine

## 2013-11-22 NOTE — ED Notes (Signed)
Called for pt.  No response.

## 2013-11-22 NOTE — ED Notes (Signed)
Attempted to call pt to room again- no response.

## 2013-12-28 ENCOUNTER — Encounter (HOSPITAL_COMMUNITY): Payer: Self-pay | Admitting: Emergency Medicine

## 2013-12-28 ENCOUNTER — Emergency Department (HOSPITAL_COMMUNITY)
Admission: EM | Admit: 2013-12-28 | Discharge: 2013-12-28 | Disposition: A | Discharge status: Home / Self Care | Payer: Medicaid Other | Attending: Emergency Medicine | Admitting: Emergency Medicine

## 2013-12-28 DIAGNOSIS — Z8619 Personal history of other infectious and parasitic diseases: Secondary | ICD-10-CM | POA: Diagnosis not present

## 2013-12-28 DIAGNOSIS — K089 Disorder of teeth and supporting structures, unspecified: Secondary | ICD-10-CM | POA: Insufficient documentation

## 2013-12-28 DIAGNOSIS — K0889 Other specified disorders of teeth and supporting structures: Secondary | ICD-10-CM

## 2013-12-28 DIAGNOSIS — Z8659 Personal history of other mental and behavioral disorders: Secondary | ICD-10-CM | POA: Diagnosis not present

## 2013-12-28 MED ORDER — PENICILLIN V POTASSIUM 500 MG PO TABS: 500.0000 mg | ORAL_TABLET | Freq: Four times a day (QID) | ORAL | Status: DC

## 2013-12-28 MED ORDER — TRAMADOL HCL 50 MG PO TABS
50.0000 mg | ORAL_TABLET | Freq: Once | ORAL | Status: AC
  Administered 2013-12-28: 50 mg via ORAL
  Filled 2013-12-28: qty 1

## 2013-12-28 MED ORDER — TRAMADOL HCL 50 MG PO TABS: 50.0000 mg | ORAL_TABLET | Freq: Four times a day (QID) | ORAL | Status: DC | PRN

## 2013-12-28 MED ORDER — PENICILLIN V POTASSIUM 500 MG PO TABS
500.0000 mg | ORAL_TABLET | Freq: Once | ORAL | Status: AC
  Administered 2013-12-28: 500 mg via ORAL
  Filled 2013-12-28: qty 1

## 2013-12-28 NOTE — ED Provider Notes (Signed)
Medical screening examination/treatment/procedure(s) were performed by non-physician practitioner and as supervising physician I was immediately available for consultation/collaboration.  Rodman Recupero T Mariza Bourget, MD 12/28/13 2313 

## 2013-12-28 NOTE — ED Provider Notes (Signed)
CSN: [id number]     Arrival date & time 12/28/13  0141 History   First MD Initiated Contact with Patient 12/28/13 [phone]     Chief Complaint  Patient presents with  . Dental Pain     (Consider location/radiation/quality/duration/timing/severity/associated sxs/prior Treatment) HPI Comments: Patient with R lower 2nd molar pain for 3 day has not taken any OTC medication for discomfort   Mother has a DDS that patient should be able to seen on Monday   Patient is a 23 y.o. female presenting with tooth pain. The history is provided by the patient.  Dental Pain Location:  Lower Lower teeth location:  31/RL 2nd molar Quality:  Pulsating Severity:  Mild Onset quality:  Gradual Duration:  3 days Timing:  Constant Progression:  Worsening Chronicity:  New Context: not dental fracture and not recent dental surgery   Relieved by:  Nothing Worsened by:  Nothing tried Ineffective treatments:  None tried Associated symptoms: gum swelling   Associated symptoms: no difficulty swallowing, no facial pain, no facial swelling, no fever, no neck pain, no neck swelling, no oral bleeding and no trismus     Past Medical History  Diagnosis Date  . Depression     hospitalized for 2 weeks after suicide att  . Hx of gonorrhea    Past Surgical History  Procedure Laterality Date  . No past surgeries     Family History  Problem Relation Age of Onset  . Hypertension Mother   . Hypertension Maternal Aunt   . Hypertension Maternal Grandmother   . Depression Cousin    History  Substance Use Topics  . Smoking status: Never Smoker   . Smokeless tobacco: Never Used  . Alcohol Use: No   OB History   Grav Para Term Preterm Abortions TAB SAB Ect Mult Living   3 2 2  0 1 0 1 0 0 2     Review of Systems  Constitutional: Negative for fever.  HENT: Positive for dental problem. Negative for facial swelling and sore throat.   Genitourinary: Negative for dysuria.  Musculoskeletal: Negative for neck pain.    Neurological: Negative for weakness.  All other systems reviewed and are negative.     Allergies  Review of patient's allergies indicates no known allergies.  Home Medications   Prior to Admission medications   Medication Sig Start Date End Date Taking? Authorizing Provider  penicillin v potassium (VEETID) 500 MG tablet Take 1 tablet (500 mg total) by mouth 4 (four) times daily. 12/28/13   Arman FilterGail K Zakaree Mcclenahan, NP  traMADol (ULTRAM) 50 MG tablet Take 1 tablet (50 mg total) by mouth every 6 (six) hours as needed. 12/28/13   Arman FilterGail K Anisten Tomassi, NP   BP 128/81  Pulse 66  Temp(Src) 98.3 F (36.8 C) (Oral)  Resp 14  SpO2 100% Physical Exam  Nursing note and vitals reviewed. Constitutional: She appears well-developed and well-nourished.  HENT:  Head: Normocephalic.  Mouth/Throat: Uvula is midline. No trismus in the jaw.    Eyes: Pupils are equal, round, and reactive to light.  Neck: Normal range of motion.  Cardiovascular: Normal rate.   Pulmonary/Chest: Effort normal.  Abdominal: Soft.  Neurological: She is alert.  Skin: Skin is warm and dry.    ED Course  Procedures (including critical care time) Labs Review Labs Reviewed - No data to display  Imaging Review No results found.   EKG Interpretation None      MDM   Final diagnoses:  Pain, dental  Arman FilterGail K Tymeshia Awan, NP 12/28/13 2104

## 2013-12-28 NOTE — ED Notes (Signed)
Pt. reports progressing right lower molar pain for 3 days .

## 2014-01-12 ENCOUNTER — Emergency Department (HOSPITAL_COMMUNITY)
Admission: EM | Admit: 2014-01-12 | Discharge: 2014-01-12 | Disposition: A | Discharge status: Home / Self Care | Payer: Medicaid Other | Attending: Emergency Medicine | Admitting: Emergency Medicine

## 2014-01-12 ENCOUNTER — Encounter (HOSPITAL_COMMUNITY): Payer: Self-pay | Admitting: Emergency Medicine

## 2014-01-12 DIAGNOSIS — N12 Tubulo-interstitial nephritis, not specified as acute or chronic: Secondary | ICD-10-CM | POA: Diagnosis not present

## 2014-01-12 DIAGNOSIS — R630 Anorexia: Secondary | ICD-10-CM | POA: Insufficient documentation

## 2014-01-12 DIAGNOSIS — Z3202 Encounter for pregnancy test, result negative: Secondary | ICD-10-CM | POA: Diagnosis not present

## 2014-01-12 DIAGNOSIS — Z8619 Personal history of other infectious and parasitic diseases: Secondary | ICD-10-CM | POA: Insufficient documentation

## 2014-01-12 DIAGNOSIS — F329 Major depressive disorder, single episode, unspecified: Secondary | ICD-10-CM | POA: Insufficient documentation

## 2014-01-12 DIAGNOSIS — R109 Unspecified abdominal pain: Secondary | ICD-10-CM | POA: Diagnosis present

## 2014-01-12 LAB — CBC WITH DIFFERENTIAL/PLATELET
Basophils Absolute: 0 10*3/uL (ref 0.0–0.1)
Basophils Relative: 0 % (ref 0–1)
EOS PCT: 0 % (ref 0–5)
Eosinophils Absolute: 0 10*3/uL (ref 0.0–0.7)
HCT: 35.5 % — ABNORMAL LOW (ref 36.0–46.0)
Hemoglobin: 11.8 g/dL — ABNORMAL LOW (ref 12.0–15.0)
LYMPHS ABS: 0.7 10*3/uL (ref 0.7–4.0)
LYMPHS PCT: 6 % — AB (ref 12–46)
MCH: 26.7 pg (ref 26.0–34.0)
MCHC: 33.2 g/dL (ref 30.0–36.0)
MCV: 80.3 fL (ref 78.0–100.0)
Monocytes Absolute: 1.1 10*3/uL — ABNORMAL HIGH (ref 0.1–1.0)
Monocytes Relative: 9 % (ref 3–12)
NEUTROS PCT: 85 % — AB (ref 43–77)
Neutro Abs: 9.9 10*3/uL — ABNORMAL HIGH (ref 1.7–7.7)
Platelets: 229 10*3/uL (ref 150–400)
RBC: 4.42 MIL/uL (ref 3.87–5.11)
RDW: 13.1 % (ref 11.5–15.5)
WBC: 11.7 10*3/uL — AB (ref 4.0–10.5)

## 2014-01-12 LAB — URINALYSIS, ROUTINE W REFLEX MICROSCOPIC
Bilirubin Urine: NEGATIVE
Glucose, UA: NEGATIVE mg/dL
KETONES UR: 40 mg/dL — AB
NITRITE: POSITIVE — AB
Protein, ur: 100 mg/dL — AB
Specific Gravity, Urine: 1.02 (ref 1.005–1.030)
UROBILINOGEN UA: 1 mg/dL (ref 0.0–1.0)
pH: 5.5 (ref 5.0–8.0)

## 2014-01-12 LAB — COMPREHENSIVE METABOLIC PANEL
ALK PHOS: 51 U/L (ref 39–117)
ALT: 10 U/L (ref 0–35)
AST: 16 U/L (ref 0–37)
Albumin: 3.6 g/dL (ref 3.5–5.2)
Anion gap: 16 — ABNORMAL HIGH (ref 5–15)
BUN: 12 mg/dL (ref 6–23)
CALCIUM: 9.3 mg/dL (ref 8.4–10.5)
CO2: 23 meq/L (ref 19–32)
Chloride: 94 mEq/L — ABNORMAL LOW (ref 96–112)
Creatinine, Ser: 0.95 mg/dL (ref 0.50–1.10)
GFR calc Af Amer: >90 mL/min (ref >90)
GFR calc non Af Amer: 86 mL/min — ABNORMAL LOW (ref >90)
GLUCOSE: 90 mg/dL (ref 70–99)
POTASSIUM: 3.8 meq/L (ref 3.7–5.3)
Sodium: 133 mEq/L — ABNORMAL LOW (ref 137–147)
TOTAL PROTEIN: 8.4 g/dL — AB (ref 6.0–8.3)
Total Bilirubin: 1.3 mg/dL — ABNORMAL HIGH (ref 0.3–1.2)

## 2014-01-12 LAB — URINE MICROSCOPIC-ADD ON

## 2014-01-12 LAB — LIPASE, BLOOD: LIPASE: 35 U/L (ref 11–59)

## 2014-01-12 LAB — POC URINE PREG, ED: PREG TEST UR: NEGATIVE

## 2014-01-12 MED ORDER — SODIUM CHLORIDE 0.9 % IV BOLUS (SEPSIS)
1000.0000 mL | Freq: Once | INTRAVENOUS | Status: AC
  Administered 2014-01-12: 1000 mL via INTRAVENOUS

## 2014-01-12 MED ORDER — ACETAMINOPHEN 325 MG PO TABS
650.0000 mg | ORAL_TABLET | Freq: Four times a day (QID) | ORAL | Status: DC | PRN
  Administered 2014-01-12: 650 mg via ORAL
  Filled 2014-01-12: qty 2

## 2014-01-12 MED ORDER — ONDANSETRON HCL 4 MG/2ML IJ SOLN
4.0000 mg | Freq: Once | INTRAMUSCULAR | Status: AC
  Administered 2014-01-12: 4 mg via INTRAVENOUS
  Filled 2014-01-12: qty 2

## 2014-01-12 MED ORDER — DEXTROSE 5 % IV SOLN
1.0000 g | Freq: Once | INTRAVENOUS | Status: AC
  Administered 2014-01-12: 1 g via INTRAVENOUS
  Filled 2014-01-12: qty 10

## 2014-01-12 MED ORDER — CEPHALEXIN 500 MG PO CAPS: 500.0000 mg | ORAL_CAPSULE | Freq: Four times a day (QID) | ORAL | Status: DC

## 2014-01-12 MED ORDER — HYDROCODONE-ACETAMINOPHEN 5-325 MG PO TABS: 1.0000 | ORAL_TABLET | Freq: Four times a day (QID) | ORAL | Status: DC | PRN

## 2014-01-12 MED ORDER — MORPHINE SULFATE 4 MG/ML IJ SOLN
4.0000 mg | Freq: Once | INTRAMUSCULAR | Status: AC
  Administered 2014-01-12: 4 mg via INTRAVENOUS
  Filled 2014-01-12: qty 1

## 2014-01-12 MED ORDER — ONDANSETRON HCL 4 MG PO TABS: 4.0000 mg | ORAL_TABLET | Freq: Four times a day (QID) | ORAL | Status: DC

## 2014-01-12 NOTE — ED Provider Notes (Addendum)
CSN: [id number]     Arrival date & time 01/12/14  1407 History   First MD Initiated Contact with Patient 01/12/14 1420     Chief Complaint  Patient presents with  . Abdominal Pain     (Consider location/radiation/quality/duration/timing/severity/associated sxs/prior Treatment) Patient is a 23 y.o. female presenting with abdominal pain. The history is provided by the patient.  Abdominal Pain Pain location:  Suprapubic Pain quality: aching, cramping and sharp   Pain radiates to:  L flank Pain severity:  Severe Onset quality:  Gradual Duration:  4 days Timing:  Constant Progression:  Worsening Chronicity:  New Relieved by:  None tried Worsened by:  Urination Ineffective treatments:  None tried Associated symptoms: anorexia, chills, dysuria, fever and nausea   Associated symptoms: no cough, no diarrhea, no hematemesis, no vaginal bleeding, no vaginal discharge and no vomiting   Risk factors comment:  Unknown lmp   Past Medical History  Diagnosis Date  . Depression     hospitalized for 2 weeks after suicide att  . Hx of gonorrhea    Past Surgical History  Procedure Laterality Date  . No past surgeries     Family History  Problem Relation Age of Onset  . Hypertension Mother   . Hypertension Maternal Aunt   . Hypertension Maternal Grandmother   . Depression Cousin    History  Substance Use Topics  . Smoking status: Never Smoker   . Smokeless tobacco: Never Used  . Alcohol Use: No   OB History   Grav Para Term Preterm Abortions TAB SAB Ect Mult Living   3 2 2  0 1 0 1 0 0 2     Review of Systems  Constitutional: Positive for fever and chills.  Respiratory: Negative for cough.   Gastrointestinal: Positive for nausea, abdominal pain and anorexia. Negative for vomiting, diarrhea and hematemesis.  Genitourinary: Positive for dysuria. Negative for vaginal bleeding and vaginal discharge.  All other systems reviewed and are negative.     Allergies  Review of  patient's allergies indicates no known allergies.  Home Medications   Prior to Admission medications   Medication Sig Start Date End Date Taking? Authorizing Provider  penicillin v potassium (VEETID) 500 MG tablet Take 1 tablet (500 mg total) by mouth 4 (four) times daily. 12/28/13   Arman FilterGail K Schulz, NP  traMADol (ULTRAM) 50 MG tablet Take 1 tablet (50 mg total) by mouth every 6 (six) hours as needed. 12/28/13   Arman FilterGail K Schulz, NP   BP 116/62  Pulse 98  Temp(Src) 103 F (39.4 C) (Oral)  Resp 18  SpO2 98%  LMP 12/29/2013 Physical Exam  Nursing note and vitals reviewed. Constitutional: She is oriented to person, place, and time. She appears well-developed and well-nourished. She appears distressed.  HENT:  Head: Normocephalic and atraumatic.  Mouth/Throat: Oropharynx is clear and moist. Mucous membranes are dry.  Eyes: Conjunctivae and EOM are normal. Pupils are equal, round, and reactive to light.  Neck: Normal range of motion. Neck supple.  Cardiovascular: Normal rate, regular rhythm and intact distal pulses.   No murmur heard. Pulmonary/Chest: Effort normal and breath sounds normal. No respiratory distress. She has no wheezes. She has no rales.  Abdominal: Soft. She exhibits no distension. There is tenderness in the suprapubic area. There is CVA tenderness. There is no rebound and no guarding.  Left flank pain  Musculoskeletal: Normal range of motion. She exhibits no edema and no tenderness.  Neurological: She is alert and oriented to person,  place, and time.  Skin: Skin is warm and dry. No rash noted. No erythema.  Psychiatric: She has a normal mood and affect. Her behavior is normal.    ED Course  Procedures (including critical care time) Labs Review Labs Reviewed  CBC WITH DIFFERENTIAL - Abnormal; Notable for the following:    WBC 11.7 (*)    Hemoglobin 11.8 (*)    HCT 35.5 (*)    Neutrophils Relative % 85 (*)    Neutro Abs 9.9 (*)    Lymphocytes Relative 6 (*)     Monocytes Absolute 1.1 (*)    All other components within normal limits  COMPREHENSIVE METABOLIC PANEL - Abnormal; Notable for the following:    Sodium 133 (*)    Chloride 94 (*)    Total Protein 8.4 (*)    Total Bilirubin 1.3 (*)    GFR calc non Af Amer 86 (*)    Anion gap 16 (*)    All other components within normal limits  URINALYSIS, ROUTINE W REFLEX MICROSCOPIC - Abnormal; Notable for the following:    APPearance CLOUDY (*)    Hgb urine dipstick MODERATE (*)    Ketones, ur 40 (*)    Protein, ur 100 (*)    Nitrite POSITIVE (*)    Leukocytes, UA MODERATE (*)    All other components within normal limits  URINE MICROSCOPIC-ADD ON - Abnormal; Notable for the following:    Bacteria, UA MANY (*)    All other components within normal limits  LIPASE, BLOOD  POC URINE PREG, ED    Imaging Review No results found.   EKG Interpretation None      MDM   Final diagnoses:  Pyelonephritis    Patient with symptoms consistent with pyelonephritis with fever of 103, dysuria, flank pain and nausea.  UPT is negative. CBC with a white count of 11,000 but otherwise within normal limits. UA positive for a urinary tract infection. Patient denies any vaginal symptoms. She was given IV Rocephin, pain and nausea control. Will by mouth challenge    Gwyneth SproutWhitney Shanaye Rief, MD 01/12/14 1534  Gwyneth SproutWhitney Jontae Adebayo, MD 01/12/14 1539

## 2014-01-12 NOTE — ED Notes (Signed)
Triaged completed by Orie Fishermanhristina Goss, RN, charged under Adnan Vanvoorhis as error.

## 2014-01-12 NOTE — Discharge Instructions (Signed)
Pyelonephritis, Adult °Pyelonephritis is a kidney infection. A kidney infection can happen quickly, or it can last for a long time. °HOME CARE  °· Take your medicine (antibiotics) as told. Finish it even if you start to feel better. °· Keep all doctor visits as told. °· Drink enough fluids to keep your pee (urine) clear or pale yellow. °· Only take medicine as told by your doctor. °GET HELP RIGHT AWAY IF:  °· You have a fever or lasting symptoms for more than 2-3 days. °· You have a fever and your symptoms suddenly get worse. °· You cannot take your medicine or drink fluids as told. °· You have chills and shaking. °· You feel very weak or pass out (faint). °· You do not feel better after 2 days. °MAKE SURE YOU: °· Understand these instructions. °· Will watch your condition. °· Will get help right away if you are not doing well or get worse. °Document Released: 04/14/2004 Document Revised: 09/06/2011 Document Reviewed: 08/25/2010 °[employer]® Patient Information ©2015 [employer]. This information is not intended to replace advice given to you by your health care provider. Make sure you discuss any questions you have with your health care provider. ° °

## 2014-01-12 NOTE — ED Notes (Signed)
Per [facility], pt c/o HA and abdominal pain. House mate stated she has been feeling sick for 3-4 days. Pain started in flank area and moved to stomach. Painful urination and discoloration. Pt has no appetite. C/o nausea, denies vomiting. Pt is AAOX4.

## 2014-01-13 ENCOUNTER — Emergency Department (HOSPITAL_COMMUNITY)
Admission: EM | Admit: 2014-01-13 | Discharge: 2014-01-13 | Disposition: A | Discharge status: Home / Self Care | Payer: Medicaid Other | Attending: Emergency Medicine | Admitting: Emergency Medicine

## 2014-01-13 ENCOUNTER — Encounter (HOSPITAL_COMMUNITY): Payer: Self-pay | Admitting: Emergency Medicine

## 2014-01-13 DIAGNOSIS — Z8719 Personal history of other diseases of the digestive system: Secondary | ICD-10-CM | POA: Diagnosis not present

## 2014-01-13 DIAGNOSIS — R109 Unspecified abdominal pain: Secondary | ICD-10-CM | POA: Diagnosis present

## 2014-01-13 DIAGNOSIS — N12 Tubulo-interstitial nephritis, not specified as acute or chronic: Secondary | ICD-10-CM | POA: Diagnosis not present

## 2014-01-13 DIAGNOSIS — Z792 Long term (current) use of antibiotics: Secondary | ICD-10-CM | POA: Diagnosis not present

## 2014-01-13 DIAGNOSIS — Z8619 Personal history of other infectious and parasitic diseases: Secondary | ICD-10-CM | POA: Diagnosis not present

## 2014-01-13 LAB — URINALYSIS, ROUTINE W REFLEX MICROSCOPIC
Bilirubin Urine: NEGATIVE
GLUCOSE, UA: NEGATIVE mg/dL
Ketones, ur: 15 mg/dL — AB
Nitrite: NEGATIVE
Protein, ur: 30 mg/dL — AB
SPECIFIC GRAVITY, URINE: 1.02 (ref 1.005–1.030)
Urobilinogen, UA: 1 mg/dL (ref 0.0–1.0)
pH: 5.5 (ref 5.0–8.0)

## 2014-01-13 LAB — BASIC METABOLIC PANEL
Anion gap: 15 (ref 5–15)
BUN: 12 mg/dL (ref 6–23)
CHLORIDE: 97 meq/L (ref 96–112)
CO2: 22 meq/L (ref 19–32)
Calcium: 9.1 mg/dL (ref 8.4–10.5)
Creatinine, Ser: 0.84 mg/dL (ref 0.50–1.10)
GFR calc Af Amer: >90 mL/min (ref >90)
GFR calc non Af Amer: >90 mL/min (ref >90)
GLUCOSE: 103 mg/dL — AB (ref 70–99)
POTASSIUM: 3.8 meq/L (ref 3.7–5.3)
SODIUM: 134 meq/L — AB (ref 137–147)

## 2014-01-13 LAB — CBC WITH DIFFERENTIAL/PLATELET
BASOS PCT: 0 % (ref 0–1)
Basophils Absolute: 0 10*3/uL (ref 0.0–0.1)
EOS ABS: 0 10*3/uL (ref 0.0–0.7)
Eosinophils Relative: 0 % (ref 0–5)
HCT: 29.5 % — ABNORMAL LOW (ref 36.0–46.0)
Hemoglobin: 9.5 g/dL — ABNORMAL LOW (ref 12.0–15.0)
LYMPHS ABS: 0.9 10*3/uL (ref 0.7–4.0)
Lymphocytes Relative: 13 % (ref 12–46)
MCH: 26 pg (ref 26.0–34.0)
MCHC: 32.2 g/dL (ref 30.0–36.0)
MCV: 80.6 fL (ref 78.0–100.0)
Monocytes Absolute: 0.9 10*3/uL (ref 0.1–1.0)
Monocytes Relative: 13 % — ABNORMAL HIGH (ref 3–12)
NEUTROS PCT: 74 % (ref 43–77)
Neutro Abs: 5.3 10*3/uL (ref 1.7–7.7)
PLATELETS: 196 10*3/uL (ref 150–400)
RBC: 3.66 MIL/uL — AB (ref 3.87–5.11)
RDW: 13.4 % (ref 11.5–15.5)
WBC: 7.2 10*3/uL (ref 4.0–10.5)

## 2014-01-13 LAB — URINE MICROSCOPIC-ADD ON

## 2014-01-13 LAB — I-STAT BETA HCG BLOOD, ED (MC, WL, AP ONLY): I-stat hCG, quantitative: <5 m[IU]/mL (ref <5)

## 2014-01-13 LAB — I-STAT CG4 LACTIC ACID, ED: LACTIC ACID, VENOUS: 1.06 mmol/L (ref 0.5–2.2)

## 2014-01-13 LAB — POC OCCULT BLOOD, ED: FECAL OCCULT BLD: NEGATIVE

## 2014-01-13 MED ORDER — ONDANSETRON HCL 4 MG/2ML IJ SOLN
4.0000 mg | Freq: Once | INTRAMUSCULAR | Status: AC
  Administered 2014-01-13: 4 mg via INTRAVENOUS
  Filled 2014-01-13: qty 2

## 2014-01-13 MED ORDER — SODIUM CHLORIDE 0.9 % IV BOLUS (SEPSIS)
1000.0000 mL | Freq: Once | INTRAVENOUS | Status: AC
  Administered 2014-01-13: 1000 mL via INTRAVENOUS

## 2014-01-13 MED ORDER — MORPHINE SULFATE 4 MG/ML IJ SOLN
4.0000 mg | Freq: Once | INTRAMUSCULAR | Status: AC
  Administered 2014-01-13: 4 mg via INTRAVENOUS
  Filled 2014-01-13: qty 1

## 2014-01-13 MED ORDER — IBUPROFEN 200 MG PO TABS
400.0000 mg | ORAL_TABLET | Freq: Once | ORAL | Status: AC
  Administered 2014-01-13: 400 mg via ORAL
  Filled 2014-01-13: qty 2

## 2014-01-13 MED ORDER — CEPHALEXIN 250 MG PO CAPS
500.0000 mg | ORAL_CAPSULE | Freq: Once | ORAL | Status: AC
  Administered 2014-01-13: 500 mg via ORAL
  Filled 2014-01-13: qty 2

## 2014-01-13 MED ORDER — OXYCODONE-ACETAMINOPHEN 5-325 MG PO TABS: ORAL_TABLET | ORAL | Status: DC

## 2014-01-13 NOTE — ED Provider Notes (Signed)
Complains of diffuse body aches for approximately one week complains of diffuse abdominal pain, headache maximum temperature was 103. Denies vaginal discharge no nausea or vomiting. On exam patient is alert and nontoxic HEENT exam extremities moist pink conjunctiva are pink neck supple lungs clear auscultation heart regular rhythm abdomen nondistended nontender. No flank tenderness. Extremities without edema  Doug SouSam Hortence Charter, MD 01/13/14 1733

## 2014-01-13 NOTE — ED Notes (Signed)
Per ems, pt having uti sx for about a week, generalized pain, n/v; was here yesterday and dx with kidney infection; sent home on abx and zofran and not feeling better

## 2014-01-13 NOTE — Discharge Instructions (Signed)
Do not take hydrocodone when you are taking oxycodone.  For fever and pain control, please take Ibuprofen (also known as Motrin or Advil) 400mg  (this is normally 2 over the counter pills) every 4-6 hours. Take with food to minimize stomach irritation.  Take your antibiotics as directed and to completion. You should never have any leftover antibiotics! Push fluids and stay well hydrated.   Any antibiotic use can reduce the efficacy of hormonal birth control. Please use back up method of contraception.   Do not hesitate to return to the emergency room for any new, worsening or concerning symptoms.  Please obtain primary care using resource guide below. But the minute you were seen in the emergency room and that they will need to obtain records for further outpatient management.   Pyelonephritis, Adult Pyelonephritis is a kidney infection. A kidney infection can happen quickly, or it can last for a long time. HOME CARE   Take your medicine (antibiotics) as told. Finish it even if you start to feel better.  Keep all doctor visits as told.  Drink enough fluids to keep your pee (urine) clear or pale yellow.  Only take medicine as told by your doctor. GET HELP RIGHT AWAY IF:   You have a fever or lasting symptoms for more than 2-3 days.  You have a fever and your symptoms suddenly get worse.  You cannot take your medicine or drink fluids as told.  You have chills and shaking.  You feel very weak or pass out (faint).  You do not feel better after 2 days. MAKE SURE YOU:  Understand these instructions.  Will watch your condition.  Will get help right away if you are not doing well or get worse. Document Released: 04/14/2004 Document Revised: 09/06/2011 Document Reviewed: 08/25/2010 [facility] Patient Information 2015 [place], [place]. This information is not intended to replace advice given to you by your health care provider. Make sure you discuss any questions you have with your  health care provider.    Emergency Department Resource Guide 1) Find a Doctor and Pay Out of Pocket Although you won't have to find out who is covered by your insurance plan, it is a good idea to ask around and get recommendations. You will then need to call the office and see if the doctor you have chosen will accept you as a new patient and what types of options they offer for patients who are self-pay. Some doctors offer discounts or will set up payment plans for their patients who do not have insurance, but you will need to ask so you aren't surprised when you get to your appointment.  2) Contact Your Local Health Department Not all health departments have doctors that can see patients for sick visits, but many do, so it is worth a call to see if yours does. If you don't know where your local health department is, you can check in your phone book. The CDC also has a tool to help you locate your state's health department, and many state websites also have listings of all of their local health departments.  3) Find a Walk-in Clinic If your illness is not likely to be very severe or complicated, you may want to try a walk in clinic. These are popping up all over the country in pharmacies, drugstores, and shopping centers. They're usually staffed by nurse practitioners or physician assistants that have been trained to treat common illnesses and complaints. They're usually fairly quick and inexpensive. However, if you  have serious medical issues or chronic medical problems, these are probably not your best option.  No Primary Care Doctor: - Call Health Connect at  [phone] - they can help you locate a primary care doctor that  accepts your insurance, provides certain services, etc. - Physician Referral Service- [phone]  Chronic Pain Problems: Organization         Address  Phone   Notes  [facility]  [phone]) [phone] Patients need to be referred by their primary care  doctor.   Medication Assistance: Organization         Address  Phone   Notes  [facility] [address] [place], [place] [address] [phone] --Must be a resident of [facility] -- Must have NO insurance coverage whatsoever (no Medicaid/ Medicare, etc.) -- The pt. MUST have a primary care doctor that directs their care regularly and follows them in the community   MedAssist  [phone]) [phone]   [employer]  [phone]) [phone]    Agencies that provide inexpensive medical care: Organization         Address  Phone   Notes  [facility]  [phone]) [phone]   [facility]    [phone]   [facility] [address] [place], [place] [address] [phone]   [facility] [address], [place] [phone]) [phone]   [facility]    [phone]) [phone]   [facility]    [phone]   [facility]  [address], [place] Phone:  [phone], Fax:  [phone]) [phone] Hours of Operation:  9 am - 6 pm, M-F.  Also accepts Medicaid/Medicare and self-pay.  [facility]  [address], [place] Phone: [phone], Fax: [phone]. Hours of Operation:  8:30 am - 5:30 pm, M-F.  Also accepts Medicaid and self-pay.  [facility] [place] [address], [place] Phone: [phone]) [phone]   [facility] [address], [place], [place] [phone], Ext. [phone] Mondays & Thursdays: 7-9 AM.  First 15 patients are seen on a first come, first serve basis.    Medicaid-accepting [facility] Providers:  Organization         Address  Phone   Notes  [facility] [address], [place] [phone] Also accepts self-pay patients.  [facility] [address] [place], [place]  [phone]) [phone]   [facility] [address], [place] [phone]) [phone]   [facility] [address] [place] [address], [place] [phone]) [phone]   Renaye RakersVeita Bland [address], [place]   [phone] Only accepts [place] Access [place] patients after they have their name applied to their card.   Self-Pay (no insurance) in [facility]:  Organization         Address  Phone   Notes  Sickle Cell Patients, [facility] [address], [place] [phone]) [phone]   [facility] [address], [place] [phone]) [phone]   [facility] [place]  [address], [place] [phone]   [facility]/Dr. Osei-Bonsu  [address] [place] [address], [place] or [phone], [place] [phone] Phone number for both [place] and [place]  locations is the same.  [facility] [address], [place] [phone]) [phone]   [facility] [place] [address] [place] [address], [place] or [address] [phone]) [phone]   [facility] [address], [place] [phone]) [phone], phone; [phone]) [phone], fax Sees patients 1st and 3rd Saturday of every month.  Must not qualify for public or private insurance (i.e. Medicaid, Medicare, [place] Health Choice, Veterans' Benefits)  Household income should be no more than 200% of the poverty level The clinic cannot treat you if you are pregnant or think you are pregnant  Sexually transmitted diseases are not treated at the clinic.    Dental Care: Organization         Address  Phone  Notes  [facility] [address], [place] [phone]) [phone] Accepts children up to age 21 who are enrolled in [place] or [place] Health Choice; pregnant women with a Medicaid card; and children who have applied for Medicaid or [place] Health Choice, but were declined, whose parents can pay a reduced fee at time of  service.  [facility] of [facility] [place]  [address], [place] [phone] Accepts children up to age 21 who are enrolled in [place] or [place] Health Choice; pregnant women with a Medicaid card; and children who have applied for Medicaid or [place] Health Choice, but were declined, whose parents can pay a reduced fee at time of service.  [facility]  [address], [place] [phone]) [phone] Patients are seen by appointment only. Walk-ins are not accepted. [facility] will see patients 218 years of age and older. Monday - Tuesday (8am-5pm) Most Wednesdays (8:30-5pm) $30 per visit, cash only  [facility]  [address], [facility] [phone] Patients are seen by appointment only. Walk-ins are not accepted. [facility] will see patients 21 years of age and older. One Wednesday Evening (Monthly: Volunteer Based).  $30 per visit, cash only  [facility]  [phone]) [phone] for adults; Children under age 224, call Graduate Pediatric Dentistry at [phone]. Children aged 304-14, please call [phone]) [phone] to request a pediatric application.  Dental services are provided in all areas of dental care including fillings, crowns and bridges, complete and partial dentures, implants, gum treatment, root canals, and extractions. Preventive care is also provided. Treatment is provided to both adults and children. Patients are selected via a lottery and there is often a waiting list.   [facility] [address], [place]  [phone]) [phone] [email or web address]   [facility] [address], [place], [place] [phone], Ext. [phone] Second and Fourth Thursday of each month, opens at 6:30 AM; Clinic ends at 9 AM.  Patients are seen on a first-come first-served basis, and a limited number are seen during each clinic.   [facility]  [address], [place],  [place] [phone]) [phone]   Eligibility Requirements You must have lived in [place], [place], or [place] counties for at least the last three months.   You cannot be eligible for state or federal sponsored National Cityhealthcare insurance, including CIGNAVeterans Administration, [place]icaid, or [employer]Medicare.   You generally cannot be eligible for healthcare insurance through your employer.    How to apply: Eligibility screenings are held every Tuesday and Wednesday afternoon from 1:00 pm until 4:00 pm. You do not need  an appointment for the interview!  [facility] [address] [place], [place] [phone]   [facility] Health Department  [phone]   [facility] Health Department  [phone]   [facility] Health Department  [phone]    Behavioral Health Resources in the Community: Intensive Outpatient Programs Organization         Address  Phone  Notes  [facility] [address], [place], [place] [phone]   [facility] [address], [place], [place] [phone]   [facility] [address] [place], [place]  [phone]   [facility] [address],  [place], [place] [phone] or [phone]   Substance Abuse Resources Organization         Address  Phone  Notes  [facility]  [phone]   [facility]  [phone]   [facility] [place]  [phone]   Floydene Flock  [phone]   Residential & Outpatient Substance Abuse Program  [phone]   Psychological Services Organization         Address  Phone  Notes  [facility]  [phone]   [facility]  [phone]   [facility] [address] [place] [phone] or [phone]    Mobile Crisis Teams Organization         Address  Phone  Notes  [facility], Mobile Crisis Care Unit  [phone]   [facility]  [address] [place], [place] [phone]   Doristine Locks [address] [place] [phone]    Self-Help/Support Groups Organization         Address  Phone             Notes  [facility] [place] - variety of support groups  [phone]- [phone] Call for more information  Narcotics Anonymous (NA), [facility] [address], [employer] [place]  2 meetings at this location   Statistician         Address  Phone  Notes  [facility] [address],    [place]  [phone]   [facility]  [address] [place] [address], [place], [place] [phone]   [facility] [address] [place], [place] [phone] Admissions: 8am-3pm M-F  [facility] [address],    [place], [place] [phone]   [facility] [address] [place], [place], [place] [phone]   [facility] [address]  [place], [place] [phone]   [facility] - Intensive Outpatient [address], [place], [place] [phone]   [facility] [address] [place].,  [place], [place] [phone] or [phone]   [facility] ([facility]) [address] [place], [place] [phone] Accepts Medicaid  [facility] [place] [address]  [place] [phone] Substance Abuse/Addiction Treatment   [facility] Organization         Address  Phone  Notes  [facility]  [phone]   Angie Fava, PhD [address] [place], [place]   [phone] or [phone]   [facility]   [address] [place], [place] [phone]   [facility] [address], [place], [place] [phone] Insurance/Medicaid/sponsorship through [employer] and Families [address]  [place], [place] [phone]  [place], [place] [phone]    Dr. Adele Schilder  [phone]   [facility] Dept. 1) [address], [place] 2) [place] 3)  [place] [address], [place] [phone]  [phone]   [place] [phone] or [phone] (After Hours)

## 2014-01-13 NOTE — ED Provider Notes (Signed)
CSN: [id number]     Arrival date & time 01/13/14  1028 History   First MD Initiated Contact with Patient 01/13/14 1142     Chief Complaint  Patient presents with  . Urinary Tract Infection     (Consider location/radiation/quality/duration/timing/severity/associated sxs/prior Treatment) HPI  Sharon Johnston is a 23 y.o. female with no significant past medical history presenting today for evaluation of pyelonephritis and diffuse myalgia. Patient was seen yesterday, given a gram of Rocephin and sent home with Vicodin, Zofran and Keflex. Patient has not taken any medication today, cannot explain why she has not taken her medication but states she does not feel good. She denies nausea, vomiting. She is not taking specific medication for antipyretics. She denies chest pain, shortness of breath, palpitations, abnormal vaginal discharge, change in bowel habits. States that she has had a lower abdominal pressure and foul-smelling urine or stinging over the course of 7 days.  Past Medical History  Diagnosis Date  . Depression     hospitalized for 2 weeks after suicide att  . Hx of gonorrhea    Past Surgical History  Procedure Laterality Date  . No past surgeries     Family History  Problem Relation Age of Onset  . Hypertension Mother   . Hypertension Maternal Aunt   . Hypertension Maternal Grandmother   . Depression Cousin    History  Substance Use Topics  . Smoking status: Never Smoker   . Smokeless tobacco: Never Used  . Alcohol Use: No   OB History   Grav Para Term Preterm Abortions TAB SAB Ect Mult Living   3 2 2  0 1 0 1 0 0 2     Review of Systems  10 systems reviewed and found to be negative, except as noted in the HPI.   Allergies  Review of patient's allergies indicates no known allergies.  Home Medications   Prior to Admission medications   Medication Sig Start Date End Date Taking? Authorizing Provider  cephALEXin (KEFLEX) 500 MG capsule Take 500 mg by mouth 4  (four) times daily.   Yes Historical Provider, MD  ondansetron (ZOFRAN) 4 MG tablet Take 4 mg by mouth every 8 (eight) hours as needed for nausea or vomiting.   Yes Historical Provider, MD   BP 122/65  Pulse 58  Temp(Src) 99.9 F (37.7 C) (Oral)  Resp 11  Ht 5\' 3"  (1.6 m)  Wt 117 lb (53.071 kg)  BMI 20.73 kg/m2  SpO2 98%  LMP 12/29/2013 Physical Exam  Nursing note and vitals reviewed. Constitutional: She is oriented to person, place, and time. She appears well-developed and well-nourished. No distress.  Well-appearing  HENT:  Head: Normocephalic and atraumatic.  Mouth/Throat: Oropharynx is clear and moist.  Eyes: Conjunctivae and EOM are normal. Pupils are equal, round, and reactive to light.  Neck: Normal range of motion.  Cardiovascular: Normal rate, regular rhythm and intact distal pulses.   Pulmonary/Chest: Effort normal and breath sounds normal. No stridor. No respiratory distress. She has no wheezes. She has no rales. She exhibits no tenderness.  Abdominal: Soft. Bowel sounds are normal. She exhibits no distension and no mass. There is tenderness. There is no rebound and no guarding.  Mild suprapubic tenderness with no guarding or rebound.  Genitourinary:  Bilateral CVA tenderness to palpation.   Rectal exam is chaperoned by nurse:  No rashes or lesions, normal rectal tone, normal stool color  Musculoskeletal: Normal range of motion. She exhibits no edema and no tenderness.  Neurological: She is alert and oriented to person, place, and time.  Psychiatric: She has a normal mood and affect.    ED Course  Procedures (including critical care time) Labs Review Labs Reviewed  CBC WITH DIFFERENTIAL - Abnormal; Notable for the following:    WBC 14.4 (*)    RBC 1.59 (*)    Hemoglobin 4.2 (*)    HCT 12.9 (*)    Neutro Abs 11.0 (*)    Lymphocytes Relative 11 (*)    Monocytes Relative 13 (*)    Monocytes Absolute 1.9 (*)    All other components within normal limits  BASIC  METABOLIC PANEL - Abnormal; Notable for the following:    Sodium 134 (*)    Glucose, Bld 103 (*)    All other components within normal limits  URINALYSIS, ROUTINE W REFLEX MICROSCOPIC - Abnormal; Notable for the following:    APPearance HAZY (*)    Hgb urine dipstick SMALL (*)    Ketones, ur 15 (*)    Protein, ur 30 (*)    Leukocytes, UA SMALL (*)    All other components within normal limits  URINE MICROSCOPIC-ADD ON - Abnormal; Notable for the following:    Squamous Epithelial / LPF MANY (*)    Bacteria, UA MANY (*)    All other components within normal limits  CBC WITH DIFFERENTIAL - Abnormal; Notable for the following:    RBC 3.66 (*)    Hemoglobin 9.5 (*)    HCT 29.5 (*)    Monocytes Relative 13 (*)    All other components within normal limits  CULTURE, BLOOD (ROUTINE X 2)  CULTURE, BLOOD (ROUTINE X 2)  URINE CULTURE  CBC WITH DIFFERENTIAL  I-STAT CG4 LACTIC ACID, ED  I-STAT BETA HCG BLOOD, ED (MC, [facility], AP ONLY)  POC OCCULT BLOOD, ED    Imaging Review No results found.   EKG Interpretation None      MDM   Final diagnoses:  None    Filed Vitals:   01/13/14 1345 01/13/14 1430 01/13/14 1459 01/13/14 1545  BP: 122/65 107/49  143/94  Pulse: 58 56  57  Temp:   97.7 F (36.5 C)   TempSrc:   Oral   Resp: 11 13  20   Height:      Weight:      SpO2: 98% 100%  100%    Medications  sodium chloride 0.9 % bolus 1,000 mL (0 mLs Intravenous Stopped 01/13/14 1457)  ibuprofen (ADVIL,MOTRIN) tablet 400 mg (400 mg Oral Given 01/13/14 1257)  morphine 4 MG/ML injection 4 mg (4 mg Intravenous Given 01/13/14 1259)  ondansetron (ZOFRAN) injection 4 mg (4 mg Intravenous Given 01/13/14 1259)  cephALEXin (KEFLEX) capsule 500 mg (500 mg Oral Given 01/13/14 1256)  sodium chloride 0.9 % bolus 1,000 mL (0 mLs Intravenous Stopped 01/13/14 1601)    Sharon Johnston is a 23 y.o. female presenting with "not feeling good,"  patient diagnosed with pyelonephritis yesterday, given Rocephin  and Keflex in addition to Vicodin. Patient has not been taking her Vicodin for her antibiotics. Patient is nonseptic appearing, appears well-hydrated. Blood cultures, urine cultures, lactic acid and basic blood work pending.   CBC shows a white count of 14.4. Her H&H is 4. 2/12 0.9: I believe this to be a lab air, patient has no source of bleeding and does not appear clinically anemic. Will repeat CBC, and guaiac.   Repeat H&H is in the level of air for the test at 9 over  29. Guaiac is negative. Patient is tolerating by mouth. I have stressed the patient the importance of taking her antibiotics, pain medication and using a separate antipyretic. Patient has verbalized her understanding. Will change her Vicodin to Percocet. Advised her not to combine the two.   Evaluation does not show pathology that would require ongoing emergent intervention or inpatient treatment. Pt is hemodynamically stable and mentating appropriately. Discussed findings and plan with patient/guardian, who agrees with care plan. All questions answered. Return precautions discussed and outpatient follow up given.   New Prescriptions   OXYCODONE-ACETAMINOPHEN (PERCOCET/ROXICET) 5-325 MG PER TABLET    1 to 2 tabs PO q6hrs  PRN for pain         Wynetta Emery, PA-C 01/13/14 1630

## 2014-01-13 NOTE — ED Provider Notes (Signed)
Medical screening examination/treatment/procedure(s) were conducted as a shared visit with non-physician practitioner(s) and myself.  I personally evaluated the patient during the encounter.   EKG Interpretation None       Doug SouSam Davanee Klinkner, MD 01/13/14 1734

## 2014-01-13 NOTE — ED Notes (Signed)
Pt had urine and blood work done yesterday

## 2014-01-13 NOTE — ED Notes (Signed)
EDP aware of critical Hemoglobin, EDP at bedside.

## 2014-01-14 LAB — CBC WITH DIFFERENTIAL/PLATELET
Basophils Absolute: 0 10*3/uL (ref 0.0–0.1)
Basophils Relative: 0 % (ref 0–1)
Eosinophils Absolute: 0 10*3/uL (ref 0.0–0.7)
Eosinophils Relative: 0 % (ref 0–5)
HCT: 12.9 % — ABNORMAL LOW (ref 36.0–46.0)
Hemoglobin: 4.2 g/dL — CL (ref 12.0–15.0)
LYMPHS PCT: 11 % — AB (ref 12–46)
Lymphs Abs: 1.5 10*3/uL (ref 0.7–4.0)
MCH: 26.4 pg (ref 26.0–34.0)
MCHC: 32.6 g/dL (ref 30.0–36.0)
MCV: 81.1 fL (ref 78.0–100.0)
MONOS PCT: 13 % — AB (ref 3–12)
Monocytes Absolute: 1.9 10*3/uL — ABNORMAL HIGH (ref 0.1–1.0)
NEUTROS PCT: 76 % (ref 43–77)
Neutro Abs: 11 10*3/uL — ABNORMAL HIGH (ref 1.7–7.7)
PLATELETS: 338 10*3/uL (ref 150–400)
RBC: 1.59 MIL/uL — ABNORMAL LOW (ref 3.87–5.11)
RDW: 13.7 % (ref 11.5–15.5)
WBC: 14.4 10*3/uL — AB (ref 4.0–10.5)

## 2014-01-14 LAB — URINE CULTURE
COLONY COUNT: NO GROWTH
Culture: NO GROWTH

## 2014-01-19 LAB — CULTURE, BLOOD (ROUTINE X 2)
CULTURE: NO GROWTH
Culture: NO GROWTH

## 2014-01-20 ENCOUNTER — Encounter (HOSPITAL_COMMUNITY): Payer: Self-pay | Admitting: Emergency Medicine

## 2014-01-27 ENCOUNTER — Other Ambulatory Visit: Payer: Medicaid Other

## 2014-02-02 ENCOUNTER — Emergency Department (HOSPITAL_COMMUNITY)
Admission: EM | Admit: 2014-02-02 | Discharge: 2014-02-02 | Disposition: A | Discharge status: Home / Self Care | Payer: Medicaid Other | Attending: Emergency Medicine | Admitting: Emergency Medicine

## 2014-02-02 ENCOUNTER — Encounter (HOSPITAL_COMMUNITY): Payer: Self-pay | Admitting: Emergency Medicine

## 2014-02-02 DIAGNOSIS — K088 Other specified disorders of teeth and supporting structures: Secondary | ICD-10-CM | POA: Insufficient documentation

## 2014-02-02 DIAGNOSIS — Z792 Long term (current) use of antibiotics: Secondary | ICD-10-CM | POA: Diagnosis not present

## 2014-02-02 DIAGNOSIS — Z8619 Personal history of other infectious and parasitic diseases: Secondary | ICD-10-CM | POA: Diagnosis not present

## 2014-02-02 DIAGNOSIS — K0889 Other specified disorders of teeth and supporting structures: Secondary | ICD-10-CM

## 2014-02-02 DIAGNOSIS — Z8659 Personal history of other mental and behavioral disorders: Secondary | ICD-10-CM | POA: Insufficient documentation

## 2014-02-02 MED ORDER — TRAMADOL HCL 50 MG PO TABS: 50.0000 mg | ORAL_TABLET | Freq: Four times a day (QID) | ORAL | Status: DC | PRN

## 2014-02-02 MED ORDER — TRAMADOL HCL 50 MG PO TABS: 50.0000 mg | ORAL_TABLET | Freq: Once | ORAL | Status: DC

## 2014-02-02 MED ORDER — PENICILLIN V POTASSIUM 500 MG PO TABS: 500.0000 mg | ORAL_TABLET | Freq: Four times a day (QID) | ORAL | Status: AC

## 2014-02-02 NOTE — Discharge Instructions (Signed)
Dental Pain °A tooth ache may be caused by cavities (tooth decay). Cavities expose the nerve of the tooth to air and hot or cold temperatures. It may come from an infection or abscess (also called a boil or furuncle) around your tooth. It is also often caused by dental caries (tooth decay). This causes the pain you are having. °DIAGNOSIS  °Your caregiver can diagnose this problem by exam. °TREATMENT  °· If caused by an infection, it may be treated with medications which kill germs (antibiotics) and pain medications as prescribed by your caregiver. Take medications as directed. °· Only take over-the-counter or prescription medicines for pain, discomfort, or fever as directed by your caregiver. °· Whether the tooth ache today is caused by infection or dental disease, you should see your dentist as soon as possible for further care. °SEEK MEDICAL CARE IF: °The exam and treatment you received today has been provided on an emergency basis only. This is not a substitute for complete medical or dental care. If your problem worsens or new problems (symptoms) appear, and you are unable to meet with your dentist, call or return to this location. °SEEK IMMEDIATE MEDICAL CARE IF:  °· You have a fever. °· You develop redness and swelling of your face, jaw, or neck. °· You are unable to open your mouth. °· You have severe pain uncontrolled by pain medicine. °MAKE SURE YOU:  °· Understand these instructions. °· Will watch your condition. °· Will get help right away if you are not doing well or get worse. °Document Released: 03/07/2005 Document Revised: 05/30/2011 Document Reviewed: 10/24/2007 °ExitCare® Patient Information ©2015 [employer]. This information is not intended to replace advice given to you by your health care provider. Make sure you discuss any questions you have with your health care provider. ° °Emergency Department Resource Guide °1) Find a Doctor and Pay Out of Pocket °Although you won't have to find out who  is covered by your insurance plan, it is a good idea to ask around and get recommendations. You will then need to call the office and see if the doctor you have chosen will accept you as a new patient and what types of options they offer for patients who are self-pay. Some doctors offer discounts or will set up payment plans for their patients who do not have insurance, but you will need to ask so you aren't surprised when you get to your appointment. ° °2) Contact Your Local Health Department °Not all health departments have doctors that can see patients for sick visits, but many do, so it is worth a call to see if yours does. If you don't know where your local health department is, you can check in your phone book. The CDC also has a tool to help you locate your state's health department, and many state websites also have listings of all of their local health departments. ° °3) Find a Walk-in Clinic °If your illness is not likely to be very severe or complicated, you may want to try a walk in clinic. These are popping up all over the country in pharmacies, drugstores, and shopping centers. They're usually staffed by nurse practitioners or physician assistants that have been trained to treat common illnesses and complaints. They're usually fairly quick and inexpensive. However, if you have serious medical issues or chronic medical problems, these are probably not your best option. ° °No Primary Care Doctor: °- Call [facility] at  832-8000 - they can help you locate a primary   care doctor that  accepts your insurance, provides certain services, etc. °- Physician Referral Service- 1-800-533-3463 ° °Chronic Pain Problems: °Organization         Address  Phone   Notes  °[facility]  (336) 297-2271 Patients need to be referred by their primary care doctor.  ° °Medication Assistance: °Organization         Address  Phone   Notes  °[place] [facility] [address]  [address] °[employer] [place] [employer], [place] [address] (336) 641-8030 --Must be a resident of [place] °-- Must have NO insurance coverage whatsoever (no Medicaid/ Medicare, etc.) °-- The pt. MUST have a primary care doctor that directs their care regularly and follows them in the community °  °[facility]  (866) 331-1348   °[employer]  (888) 892-1162   ° °Agencies that provide inexpensive medical care: °Organization         Address  Phone   Notes  °[facility]  (336) 832-8035   °[facility]    (336) 832-7272   °[facility] [address] °[place], [place] [address] (336) 832-4777   °[facility] [address], °[place] (336) 271-4999   °[facility]    (336) 373-0678   °[facility]    (336) 272-1050   °[facility] ° [address], [place] Phone:  (336) 832-4444, Fax:  (336) 832-4440 Hours of Operation:  9 am - 6 pm, M-F.  Also accepts Medicaid/Medicare and self-pay.  °[facility] ° [address], [place] Phone: (336) 832-3150, Fax: (336) 832-3151. Hours of Operation:  8:30 am - 5:30 pm, M-F.  Also accepts Medicaid and self-pay.  °[facility] [place] [address], [place] Phone: (336) 878-6027   °[facility] [address], [place], [place] (336)723-1848, Ext. 123 Mondays & Thursdays: 7-9 AM.  First 15 patients are seen on a first come, first serve basis. °  ° °Medicaid-accepting [place] Providers: ° °Organization         Address  Phone   Notes  °[facility] [address], [place] (336) 641-2100 Also accepts self-pay patients.  °[facility] [address], [place] ° (336) 856-9996   °[facility] [address], [place] (336) 288-8857   °[facility] [address] [place] [address], [place] (336) 299-7000   °Veita Bland [address], [place]  ° (336) 373-1557 Only accepts [place] Access Medicaid patients after they have their name applied to their card.  ° °Self-Pay (no insurance) in [place]: ° °Organization         Address  Phone   Notes  °Sickle Cell Patients, [facility] [address], [place] (336) 832-1970   °[facility] [address], [place] (336) 832-4400   °[facility] ° [address], [place] (336) 992-4800   °[facility]/Dr. Osei-Bonsu ° [address] [place] [address], [place] or [address], [place] (336) 841-8500 Phone number for both [place] and [place] locations is the same.  °[facility] [address], [place] (336) 299-0000   °[facility]  [address] [place] [address],  or [address] (336) 852-7530 °(336) 878-2260   °  [facility] [address], [place] (336) 350-1642, phone; (336) 294-5005, fax Sees patients 1st and 3rd Saturday of every month.  Must not qualify for public or private insurance (i.e. Medicaid, Medicare, [place] Health Choice, Veterans' Benefits) • Household income should be no more than 200% of the poverty level •The clinic cannot treat you if you are pregnant or think you are pregnant • Sexually transmitted diseases are not treated at the clinic.  ° ° °Dental Care: °Organization         Address  Phone  Notes  °[place] [facility] [address], [place] (336) 641-6152 Accepts children up to age 21 who are enrolled in Medicaid or [place] Health Choice; pregnant women with a Medicaid card; and children who have applied for Medicaid or [place] Health Choice, but were declined, whose parents can pay a reduced fee at time of service.  °[facility] [place]  [address], [place] (336) 641-7733 Accepts children up to age 21 who are  enrolled in Medicaid or [place] Health Choice; pregnant women with a Medicaid card; and children who have applied for Medicaid or [place] Health Choice, but were declined, whose parents can pay a reduced fee at time of service.  °[facility] ° [address], [place] (336) 641-4533 Patients are seen by appointment only. Walk-ins are not accepted. [facility] will see patients 18 years of age and older. °Monday - Tuesday (8am-5pm) °Most Wednesdays (8:30-5pm) °$30 per visit, cash only  °[facility] ° [address], [place] (336) 641-4533 Patients are seen by appointment only. Walk-ins are not accepted. [facility] will see patients 18 years of age and older. °One Wednesday Evening (Monthly: Volunteer Based).  $30 per visit, cash only  °[facility]  (919) 537-3737 for adults; Children under age 4, call Graduate Pediatric Dentistry at (919) [phone]. Children aged 4-14, please call (919) [phone] to request a pediatric application. ° Dental services are provided in all areas of dental care including fillings, crowns and bridges, complete and partial dentures, implants, gum treatment, root canals, and extractions. Preventive care is also provided. Treatment is provided to both adults and children. °Patients are selected via a lottery and there is often a waiting list. °  °[facility] [address], °[place] ° (336) 763-8833 [email or web address] °  °[facility] [address], [place], [place] (336)723-1848, Ext. 123 Second and Fourth Thursday of each month, opens at 6:30 AM; Clinic ends at 9 AM.  Patients are seen on a first-come first-served basis, and a limited number are seen during each clinic.  ° °[facility] ° [address], [place], [place] (336) 723-7904   Eligibility Requirements °You must have lived in [place], [place], or [place] counties for at least the last three months. °  You  cannot be eligible for state or federal sponsored healthcare insurance, including Veterans Administration, Medicaid, or Medicare. °  You generally cannot be eligible for healthcare insurance through your employer.  °  How to apply: °Eligibility screenings are held every Tuesday and Wednesday afternoon from 1:00 pm until 4:00 pm. You do not need an appointment for the interview!  °[facility] [address], [place], [place] 336-631-2330   °[facility]  336-342-8273   °[facility]  336-703-3100   °[facility]  336-570-6415   ° °Behavioral Health Resources in the Community: °Intensive Outpatient Programs °Organization         Address  Phone  Notes  °[facility] [address], [place], [place] 336-878-6098   °[facility] [address], [place], [place] 336-832-9800   °[facility] [address], [place], [place] ° 336-882-2125   °[facility] [address],  °[place], [place] 1-800-853-5163 or 336-641-4981   °Substance Abuse Resources °Organization         Address  Phone  Notes  °[facility]  336-882-2125   °[facility]  336-784-9470   °[facility]  336-285-9073   °[facility]  336-845-3988   °Residential & Outpatient Substance Abuse Program  1-800-659-3381   °Psychological Services °Organization         Address  Phone  Notes  °[facility]  336- 832-9600   °[facility]  336- 378-7881   °[facility] [address], [place] 1-800-853-5163 or 336-641-4981   ° °Mobile Crisis Teams °Organization         Address  Phone  Notes  °[facility], Mobile Crisis Care Unit  1-877-626-1772   °Assertive °[facility] ° [address] [place], [place] 336-834-9664   °Sharon DeEsch [address] °[place] 336-554-5454   ° °Self-Help/Support  Groups °Organization         Address  Phone             Notes  °[facility] [place] - variety of support groups  336- 373-1402 Call for more information  °Narcotics Anonymous (NA), [facility] [address], °[place]  2 meetings at this location  ° °Residential Treatment Programs °Organization         Address  Phone  Notes  °[facility] [address],    °[place]  1-866-801-8205   °[facility] ° [address], [place], [place] 704-293-8524   °[facility] [address], [place] 336-845-3988 Admissions: 8am-3pm M-F  °[facility] [address]    °[place], [place] 336-841-1104   °[facility] [address], [place], [place] 336-379-7146   °[facility] [address]  °[place], [place] 336-285-9073   °[facility] - Intensive Outpatient [address], [place], [place] 336-852-3033   °[facility] [address]  °[place], [place] 1-877-615-2722 or 336-784-9470   °[facility] [address] [place], [place] 336-227-7417 Accepts Medicaid  °[facility] [address]  ° [place] 1-800-659-3381 Substance Abuse/Addiction Treatment  ° °[place] Behavioral Health Resources °Organization         Address  Phone  Notes  °[facility]  (888) 581-9988   °Julie Brannon, PhD [address] [place], [place]   (336) 349-5553 or (336) 951-0000   °[facility]   [address] °[place], [place] (336) 349-4454   °[facility] [address], [place], [place] (336) 342-8316 Insurance/Medicaid/sponsorship through [facility]  °[facility] [address]                                    [place],  (336) 342-8316 Therapy/tele-psych/case  °[facility]   [address]  ° [place], [place] (336) 349-2233    °Dr. Arfeen  (336) 349-4544   °[facility] [place]  [employer] [facility] [place] [facility] 1) [address], [place] °2) [address], [place] °3)  [address], [place] (336) 349-3220 °(336) 342-7768 ° °(336) 342-8140   °[place] [facility] (336) 342-1394 or (336) 342-3537 (After Hours)    ° ° ° °

## 2014-02-02 NOTE — ED Provider Notes (Signed)
CSN: [id number]     Arrival date & time 02/02/14  1411 History  This chart was scribed for Terri Piedraourtney Forcucci, PA-C, working with Vida RollerBrian D Miller, MD by Chestine SporeSoijett Blue, ED Scribe. The patient was seen in room WTR5/WTR5 at 2:37 PM.   Chief Complaint  Patient presents with  . Dental Pain    The history is provided by the patient. No language interpreter was used.   HPI Comments: Sharon Johnston is a 23 y.o. female who presents to the Emergency Department complaining of lower dental pain onset 3 days ago. She states that the pain radiates to her jaw. She states that she had a fever two days ago. She states that she bites down on her teeth and when she eats there is pain. She states that she does not know if there is a broken tooth. She states that she is having associated symptoms of gum swelling, and fever.  She denies trouble with opening her mouth, SOB, swelling, chills, nausea, vomiting, and any other symptoms. She states that she is not a smoker. She states that she brushes regularly but does not floss regularly. She denies any hx of seizures.  Past Medical History  Diagnosis Date  . Depression     hospitalized for 2 weeks after suicide att  . Hx of gonorrhea    Past Surgical History  Procedure Laterality Date  . No past surgeries     Family History  Problem Relation Age of Onset  . Hypertension Mother   . Hypertension Maternal Aunt   . Hypertension Maternal Grandmother   . Depression Cousin    History  Substance Use Topics  . Smoking status: Never Smoker   . Smokeless tobacco: Never Used  . Alcohol Use: No   OB History    Gravida Para Term Preterm AB TAB SAB Ectopic Multiple Living   3 2 2  0 1 0 1 0 0 2     Review of Systems  Constitutional: Positive for fever. Negative for chills.  HENT: Positive for dental problem. Negative for facial swelling and trouble swallowing.   Respiratory: Negative for shortness of breath.   Gastrointestinal: Negative for nausea and vomiting.   All other systems reviewed and are negative.   Allergies  Review of patient's allergies indicates no known allergies.  Home Medications   Prior to Admission medications   Medication Sig Start Date End Date Taking? Authorizing Provider  ibuprofen (ADVIL,MOTRIN) 200 MG tablet Take 800 mg by mouth every 6 (six) hours as needed for moderate pain.   Yes Historical Provider, MD  ondansetron (ZOFRAN) 4 MG tablet Take 4 mg by mouth every 8 (eight) hours as needed for nausea or vomiting.   Yes Historical Provider, MD  cephALEXin (KEFLEX) 500 MG capsule Take 500 mg by mouth 4 (four) times daily.    Historical Provider, MD  oxyCODONE-acetaminophen (PERCOCET/ROXICET) 5-325 MG per tablet 1 to 2 tabs PO q6hrs  PRN for pain 01/13/14   Joni ReiningNicole Pisciotta, PA-C  penicillin v potassium (VEETID) 500 MG tablet Take 1 tablet (500 mg total) by mouth 4 (four) times daily. 02/02/14 02/09/14  Tineshia Becraft A Forcucci, PA-C  traMADol (ULTRAM) 50 MG tablet Take 1 tablet (50 mg total) by mouth every 6 (six) hours as needed. 02/02/14   Khloey Chern A Forcucci, PA-C   BP 126/66 mmHg  Pulse 96  Temp(Src) 98.4 F (36.9 C) (Oral)  Resp 16  SpO2 99%  LMP 12/29/2013  Breastfeeding? No  Physical Exam  Constitutional: She is  oriented to person, place, and time. She appears well-developed and well-nourished. No distress.  HENT:  Head: Normocephalic and atraumatic.  Nose: Nose normal.  Mouth/Throat: Uvula is midline, oropharynx is clear and moist and mucous membranes are normal. No oral lesions. No trismus in the jaw. No dental abscesses or dental caries. No oropharyngeal exudate.  Left second molar tender to palpation.  Eyes: Conjunctivae and EOM are normal. Pupils are equal, round, and reactive to light. No scleral icterus.  Neck: Normal range of motion. Neck supple. No JVD present. No thyromegaly present.  Cardiovascular: Normal rate, regular rhythm and normal heart sounds.  Exam reveals no gallop and no friction rub.   No  murmur heard. Pulmonary/Chest: Effort normal and breath sounds normal. No respiratory distress. She has no wheezes. She has no rales.  Musculoskeletal: Normal range of motion.  Lymphadenopathy:    She has no cervical adenopathy.  Neurological: She is alert and oriented to person, place, and time.  Skin: Skin is warm and dry.  Psychiatric: She has a normal mood and affect. Her behavior is normal. Judgment and thought content normal.  Nursing note and vitals reviewed.   ED Course  Procedures (including critical care time) DIAGNOSTIC STUDIES: Oxygen Saturation is 99% on room air, normal by my interpretation.    COORDINATION OF CARE: 2:41 PM-Discussed treatment plan which includes abx and referral to dentist with pt at bedside and pt agreed to plan.   Labs Review Labs Reviewed - No data to display  Imaging Review No results found.   EKG Interpretation None      MDM   Final diagnoses:  Pain, dental    Patient is a 23 y.o. Female who presents to the ED with left 2nd molar dental pain.  VSS.  There is no evidence of trismus, dental abscess, or ludwigs angina.  There is tenderness to palpation.  Suspect wisdom tooth pain vs. Possible dental caries.  Patient to be covered with Pen V for possible dental infection and will be discharged with tramadol for pain.  Patient to return for trismus, shortness of breath, or difficulty with swallowing. Patient states understanding and agreement.  Patient is stable for discharge.   I personally performed the services described in this documentation, which was scribed in my presence. The recorded information has been reviewed and is accurate.    Eben Burowourtney A Forcucci, PA-C 02/02/14 1457  Vida RollerBrian D Miller, MD 02/02/14 [phone]

## 2014-02-02 NOTE — ED Notes (Signed)
C/o L dental pain, radiating to L jaw, pain 8/10. States fever 2 days ago.

## 2014-02-06 ENCOUNTER — Emergency Department (HOSPITAL_COMMUNITY)
Admission: EM | Admit: 2014-02-06 | Discharge: 2014-02-06 | Disposition: A | Discharge status: Home / Self Care | Payer: Medicaid Other | Attending: Emergency Medicine | Admitting: Emergency Medicine

## 2014-02-06 ENCOUNTER — Encounter (HOSPITAL_COMMUNITY): Payer: Self-pay | Admitting: Emergency Medicine

## 2014-02-06 DIAGNOSIS — R1030 Lower abdominal pain, unspecified: Secondary | ICD-10-CM | POA: Diagnosis present

## 2014-02-06 DIAGNOSIS — F419 Anxiety disorder, unspecified: Secondary | ICD-10-CM | POA: Diagnosis not present

## 2014-02-06 DIAGNOSIS — Z8619 Personal history of other infectious and parasitic diseases: Secondary | ICD-10-CM | POA: Insufficient documentation

## 2014-02-06 DIAGNOSIS — Z3202 Encounter for pregnancy test, result negative: Secondary | ICD-10-CM | POA: Insufficient documentation

## 2014-02-06 DIAGNOSIS — Z792 Long term (current) use of antibiotics: Secondary | ICD-10-CM | POA: Diagnosis not present

## 2014-02-06 DIAGNOSIS — R109 Unspecified abdominal pain: Secondary | ICD-10-CM

## 2014-02-06 LAB — URINALYSIS, ROUTINE W REFLEX MICROSCOPIC
Bilirubin Urine: NEGATIVE
GLUCOSE, UA: NEGATIVE mg/dL
Hgb urine dipstick: NEGATIVE
KETONES UR: NEGATIVE mg/dL
LEUKOCYTES UA: NEGATIVE
NITRITE: NEGATIVE
PROTEIN: NEGATIVE mg/dL
Specific Gravity, Urine: 1.013 (ref 1.005–1.030)
UROBILINOGEN UA: 0.2 mg/dL (ref 0.0–1.0)
pH: 8 (ref 5.0–8.0)

## 2014-02-06 MED ORDER — ACETAMINOPHEN 325 MG PO TABS
650.0000 mg | ORAL_TABLET | Freq: Once | ORAL | Status: AC
  Administered 2014-02-06: 650 mg via ORAL
  Filled 2014-02-06: qty 2

## 2014-02-06 NOTE — Discharge Instructions (Signed)
Abdominal Pain, Women °Abdominal (stomach, pelvic, or belly) pain can be caused by many things. It is important to tell your doctor: °· The location of the pain. °· Does it come and go or is it present all the time? °· Are there things that start the pain (eating certain foods, exercise)? °· Are there other symptoms associated with the pain (fever, nausea, vomiting, diarrhea)? °All of this is helpful to know when trying to find the cause of the pain. °CAUSES  °· Stomach: virus or bacteria infection, or ulcer. °· Intestine: appendicitis (inflamed appendix), regional ileitis (Crohn's disease), ulcerative colitis (inflamed colon), irritable bowel syndrome, diverticulitis (inflamed diverticulum of the colon), or cancer of the stomach or intestine. °· Gallbladder disease or stones in the gallbladder. °· Kidney disease, kidney stones, or infection. °· Pancreas infection or cancer. °· Fibromyalgia (pain disorder). °· Diseases of the female organs: °¨ Uterus: fibroid (non-cancerous) tumors or infection. °¨ Fallopian tubes: infection or tubal pregnancy. °¨ Ovary: cysts or tumors. °¨ Pelvic adhesions (scar tissue). °¨ Endometriosis (uterus lining tissue growing in the pelvis and on the pelvic organs). °¨ Pelvic congestion syndrome (female organs filling up with blood just before the menstrual period). °¨ Pain with the menstrual period. °¨ Pain with ovulation (producing an egg). °¨ Pain with an IUD (intrauterine device, birth control) in the uterus. °¨ Cancer of the female organs. °· Functional pain (pain not caused by a disease, may improve without treatment). °· Psychological pain. °· Depression. °DIAGNOSIS  °Your doctor will decide the seriousness of your pain by doing an examination. °· Blood tests. °· X-rays. °· Ultrasound. °· CT scan (computed tomography, special type of X-ray). °· MRI (magnetic resonance imaging). °· Cultures, for infection. °· Barium enema (dye inserted in the large intestine, to better view it with  X-rays). °· Colonoscopy (looking in intestine with a lighted tube). °· Laparoscopy (minor surgery, looking in abdomen with a lighted tube). °· Major abdominal exploratory surgery (looking in abdomen with a large incision). °TREATMENT  °The treatment will depend on the cause of the pain.  °· Many cases can be observed and treated at home. °· Over-the-counter medicines recommended by your caregiver. °· Prescription medicine. °· Antibiotics, for infection. °· Birth control pills, for painful periods or for ovulation pain. °· Hormone treatment, for endometriosis. °· Nerve blocking injections. °· Physical therapy. °· Antidepressants. °· Counseling with a psychologist or psychiatrist. °· Minor or major surgery. °HOME CARE INSTRUCTIONS  °· Do not take laxatives, unless directed by your caregiver. °· Take over-the-counter pain medicine only if ordered by your caregiver. Do not take aspirin because it can cause an upset stomach or bleeding. °· Try a clear liquid diet (broth or water) as ordered by your caregiver. Slowly move to a bland diet, as tolerated, if the pain is related to the stomach or intestine. °· Have a thermometer and take your temperature several times a day, and record it. °· Bed rest and sleep, if it helps the pain. °· Avoid sexual intercourse, if it causes pain. °· Avoid stressful situations. °· Keep your follow-up appointments and tests, as your caregiver orders. °· If the pain does not go away with medicine or surgery, you may try: °¨ Acupuncture. °¨ Relaxation exercises (yoga, meditation). °¨ Group therapy. °¨ Counseling. °SEEK MEDICAL CARE IF:  °· You notice certain foods cause stomach pain. °· Your home care treatment is not helping your pain. °· You need stronger pain medicine. °· You want your IUD removed. °· You feel faint or   lightheaded. °· You develop nausea and vomiting. °· You develop a rash. °· You are having side effects or an allergy to your medicine. °SEEK IMMEDIATE MEDICAL CARE IF:  °· Your  pain does not go away or gets worse. °· You have a fever. °· Your pain is felt only in portions of the abdomen. The right side could possibly be appendicitis. The left lower portion of the abdomen could be colitis or diverticulitis. °· You are passing blood in your stools (bright red or black tarry stools, with or without vomiting). °· You have blood in your urine. °· You develop chills, with or without a fever. °· You pass out. °MAKE SURE YOU:  °· Understand these instructions. °· Will watch your condition. °· Will get help right away if you are not doing well or get worse. °Document Released: 01/02/2007 Document Revised: 07/22/2013 Document Reviewed: 01/22/2009 °[employer]® Patient Information ©2015 [employer]. This information is not intended to replace advice given to you by your health care provider. Make sure you discuss any questions you have with your health care provider. ° °

## 2014-02-06 NOTE — ED Notes (Addendum)
Pt reports lower abdominal pain since 11/13. Co also nausea yet unable to vomit. Denies diarrhea. LMP 10/13.

## 2014-02-06 NOTE — ED Provider Notes (Signed)
CSN: [id number]     Arrival date & time 02/06/14  1856 History   First MD Initiated Contact with Patient 02/06/14 2015     Chief Complaint  Patient presents with  . Abdominal Pain     (Consider location/radiation/quality/duration/timing/severity/associated sxs/prior Treatment) Patient is a 23 y.o. female presenting with abdominal pain. The history is provided by the patient.  Abdominal Pain Pain location: lower abdomen. Pain quality: cramping   Pain radiates to:  Does not radiate Pain severity:  Mild Onset quality:  Gradual Duration:  3 days Timing:  Intermittent Progression:  Unchanged Chronicity:  New Context comment:  At rest Relieved by:  Nothing Worsened by:  Nothing tried Ineffective treatments:  None tried Associated symptoms: no chest pain, no cough, no diarrhea, no dysuria, no fatigue, no fever, no hematuria, no nausea, no shortness of breath and no vomiting     Past Medical History  Diagnosis Date  . Depression     hospitalized for 2 weeks after suicide att  . Hx of gonorrhea    Past Surgical History  Procedure Laterality Date  . No past surgeries     Family History  Problem Relation Age of Onset  . Hypertension Mother   . Hypertension Maternal Aunt   . Hypertension Maternal Grandmother   . Depression Cousin    History  Substance Use Topics  . Smoking status: Never Smoker   . Smokeless tobacco: Never Used  . Alcohol Use: No   OB History    Gravida Para Term Preterm AB TAB SAB Ectopic Multiple Living   3 2 2  0 1 0 1 0 0 2     Review of Systems  Constitutional: Negative for fever and fatigue.  HENT: Negative for congestion and drooling.   Eyes: Negative for pain.  Respiratory: Negative for cough and shortness of breath.   Cardiovascular: Negative for chest pain.  Gastrointestinal: Positive for abdominal pain. Negative for nausea, vomiting and diarrhea.  Genitourinary: Negative for dysuria and hematuria.  Musculoskeletal: Negative for back pain,  gait problem and neck pain.  Skin: Negative for color change.  Neurological: Negative for dizziness and headaches.  Hematological: Negative for adenopathy.  Psychiatric/Behavioral: Negative for behavioral problems.  All other systems reviewed and are negative.     Allergies  Review of patient's allergies indicates no known allergies.  Home Medications   Prior to Admission medications   Medication Sig Start Date End Date Taking? Authorizing Provider  cephALEXin (KEFLEX) 500 MG capsule Take 500 mg by mouth 4 (four) times daily.   Yes Historical Provider, MD  ibuprofen (ADVIL,MOTRIN) 200 MG tablet Take 800 mg by mouth every 6 (six) hours as needed for moderate pain (pain).    Yes Historical Provider, MD  ondansetron (ZOFRAN) 4 MG tablet Take 4 mg by mouth every 8 (eight) hours as needed for nausea or vomiting (vomiting).    Yes Historical Provider, MD  oxyCODONE-acetaminophen (PERCOCET/ROXICET) 5-325 MG per tablet 1 to 2 tabs PO q6hrs  PRN for pain 01/13/14   Joni ReiningNicole Pisciotta, PA-C  penicillin v potassium (VEETID) 500 MG tablet Take 1 tablet (500 mg total) by mouth 4 (four) times daily. 02/02/14 02/09/14  Courtney A Forcucci, PA-C  traMADol (ULTRAM) 50 MG tablet Take 1 tablet (50 mg total) by mouth every 6 (six) hours as needed. 02/02/14   Courtney A Forcucci, PA-C   BP 147/81 mmHg  Pulse 58  Temp(Src) 98.8 F (37.1 C) (Oral)  Resp 14  Ht 5\' 3"  (1.6 m)  Wt 110 lb (49.896 kg)  BMI 19.49 kg/m2  SpO2 100%  LMP 12/31/2013 Physical Exam  Constitutional: She is oriented to person, place, and time. She appears well-developed and well-nourished.  HENT:  Head: Normocephalic and atraumatic.  Mouth/Throat: Oropharynx is clear and moist. No oropharyngeal exudate.  Eyes: Conjunctivae and EOM are normal. Pupils are equal, round, and reactive to light.  Neck: Normal range of motion. Neck supple.  Cardiovascular: Normal rate, regular rhythm, normal heart sounds and intact distal pulses.  Exam  reveals no gallop and no friction rub.   No murmur heard. Pulmonary/Chest: Effort normal and breath sounds normal. No respiratory distress. She has no wheezes.  Abdominal: Soft. Bowel sounds are normal. There is no tenderness. There is no rebound and no guarding.  Musculoskeletal: Normal range of motion. She exhibits no edema or tenderness.  Neurological: She is alert and oriented to person, place, and time.  Skin: Skin is warm and dry.  Psychiatric: She has a normal mood and affect. Her behavior is normal.  Nursing note and vitals reviewed.   ED Course  Procedures (including critical care time) Labs Review Labs Reviewed  URINALYSIS, ROUTINE W REFLEX MICROSCOPIC  POC URINE PREG, ED    Imaging Review No results found.   EKG Interpretation None      MDM   Final diagnoses:  Abdominal cramping    8:33 PM 23 y.o. female who presents with intermittent lower abdominal cramping over the last 2-3 days. She has some mild cramping now. She states that her LMP was 10/13 and she is late for her period. She denies any fevers, vomiting, or diarrhea. Her abdomen is benign on exam. Her urinalysis noncontributory. Her urine pregnancy test is not crossing over but was informed by the tech that it is negative. I recommended that we do screening lab work and performing pelvic exam. The patient declines and states that she has to go pick up her kids. I gave her return precautions and told her to come back for any worsening. She understands.  8:34 PM:  I have discussed the diagnosis/risks/treatment options with the patient and believe the pt to be eligible for discharge home to follow-up with her pcp as needed. We also discussed returning to the ED immediately if new or worsening sx occur. We discussed the sx which are most concerning (e.g., worsening pain, fever) that necessitate immediate return. Medications administered to the patient during their visit and any new prescriptions provided to the  patient are listed below.  Medications given during this visit Medications  acetaminophen (TYLENOL) tablet 650 mg (not administered)    New Prescriptions   No medications on file       Purvis SheffieldForrest Annalaya Wile, MD 02/06/14 2035

## 2014-02-07 LAB — POC URINE PREG, ED: Preg Test, Ur: NEGATIVE

## 2014-02-17 ENCOUNTER — Encounter (HOSPITAL_COMMUNITY): Payer: Self-pay

## 2014-02-17 ENCOUNTER — Inpatient Hospital Stay (HOSPITAL_COMMUNITY)
Admission: AD | Admit: 2014-02-17 | Discharge: 2014-02-17 | Disposition: A | Discharge status: Home / Self Care | Payer: Medicaid Other | Source: Ambulatory Visit | Attending: Obstetrics & Gynecology | Admitting: Obstetrics & Gynecology

## 2014-02-17 ENCOUNTER — Inpatient Hospital Stay (HOSPITAL_COMMUNITY): Payer: Medicaid Other

## 2014-02-17 DIAGNOSIS — O10011 Pre-existing essential hypertension complicating pregnancy, first trimester: Secondary | ICD-10-CM | POA: Diagnosis not present

## 2014-02-17 DIAGNOSIS — O9989 Other specified diseases and conditions complicating pregnancy, childbirth and the puerperium: Secondary | ICD-10-CM | POA: Insufficient documentation

## 2014-02-17 DIAGNOSIS — R109 Unspecified abdominal pain: Secondary | ICD-10-CM | POA: Insufficient documentation

## 2014-02-17 DIAGNOSIS — Z3A01 Less than 8 weeks gestation of pregnancy: Secondary | ICD-10-CM | POA: Insufficient documentation

## 2014-02-17 DIAGNOSIS — N949 Unspecified condition associated with female genital organs and menstrual cycle: Secondary | ICD-10-CM | POA: Insufficient documentation

## 2014-02-17 DIAGNOSIS — Z331 Pregnant state, incidental: Secondary | ICD-10-CM

## 2014-02-17 DIAGNOSIS — O3680X0 Pregnancy with inconclusive fetal viability, not applicable or unspecified: Secondary | ICD-10-CM

## 2014-02-17 DIAGNOSIS — R52 Pain, unspecified: Secondary | ICD-10-CM

## 2014-02-17 HISTORY — DX: Essential (primary) hypertension: I10

## 2014-02-17 LAB — CBC
HCT: 37.2 % (ref 36.0–46.0)
Hemoglobin: 12 g/dL (ref 12.0–15.0)
MCH: 27.1 pg (ref 26.0–34.0)
MCHC: 32.3 g/dL (ref 30.0–36.0)
MCV: 84.2 fL (ref 78.0–100.0)
Platelets: 388 10*3/uL (ref 150–400)
RBC: 4.42 MIL/uL (ref 3.87–5.11)
RDW: 14.7 % (ref 11.5–15.5)
WBC: 6.8 10*3/uL (ref 4.0–10.5)

## 2014-02-17 LAB — URINALYSIS, ROUTINE W REFLEX MICROSCOPIC
BILIRUBIN URINE: NEGATIVE
Glucose, UA: NEGATIVE mg/dL
Hgb urine dipstick: NEGATIVE
KETONES UR: NEGATIVE mg/dL
LEUKOCYTES UA: NEGATIVE
Nitrite: NEGATIVE
Protein, ur: NEGATIVE mg/dL
Specific Gravity, Urine: 1.025 (ref 1.005–1.030)
UROBILINOGEN UA: 0.2 mg/dL (ref 0.0–1.0)
pH: 6 (ref 5.0–8.0)

## 2014-02-17 LAB — WET PREP, GENITAL
Clue Cells Wet Prep HPF POC: NONE SEEN
TRICH WET PREP: NONE SEEN
WBC, Wet Prep HPF POC: NONE SEEN
Yeast Wet Prep HPF POC: NONE SEEN

## 2014-02-17 LAB — HCG, QUANTITATIVE, PREGNANCY: HCG, BETA CHAIN, QUANT, S: 207 m[IU]/mL — AB (ref <5)

## 2014-02-17 LAB — HIV ANTIBODY (ROUTINE TESTING W REFLEX): HIV 1&2 Ab, 4th Generation: NONREACTIVE

## 2014-02-17 LAB — POCT PREGNANCY, URINE: Preg Test, Ur: POSITIVE — AB

## 2014-02-17 NOTE — Discharge Instructions (Signed)

## 2014-02-17 NOTE — MAU Note (Signed)
Clear/white vaginal discharge with no odor noted.

## 2014-02-17 NOTE — MAU Provider Note (Signed)
History     CSN: [id number]  Arrival date and time: 02/17/14 1146   First Provider Initiated Contact with Patient 02/17/14 1242      Chief Complaint  Patient presents with  . Abdominal Pain  . Possible Pregnancy    Abdominal Pain Associated symptoms include nausea. Pertinent negatives include no dysuria, fever, frequency, headaches or vomiting.    [name] is a 23 year old female presenting to the ED with a complaint of abdominal pain. She states the pain started a week ago in her right lower quadrant area and has been intermittent. She describes the pain as cramping and are similar to the cramps she experience when she has her period. She notes the cramping would last for about 5 minutes.She has not tried anything to make the pain better or worse. She states her LMP was Oct 13. She recently went to [facility] on 11/19 for the same abomdinal complaint. She notes the abdominal pain has gotten better since then, and she is not currently experiencing any abdominal pain. She also notes she has had some vaginal discharge that is white-clear. However, she notes this is not new, and it has not changed recently.   Past Medical History  Diagnosis Date  . Depression     hospitalized for 2 weeks after suicide att  . Hx of gonorrhea   . Hypertension     Past Surgical History  Procedure Laterality Date  . No past surgeries      Family History  Problem Relation Age of Onset  . Hypertension Mother   . Hypertension Maternal Aunt   . Hypertension Maternal Grandmother   . Depression Cousin     History  Substance Use Topics  . Smoking status: Never Smoker   . Smokeless tobacco: Never Used  . Alcohol Use: No    Allergies: No Known Allergies  Prescriptions prior to admission  Medication Sig Dispense Refill Last Dose  . ibuprofen (ADVIL,MOTRIN) 200 MG tablet Take 800 mg by mouth every 6 (six) hours as needed for moderate pain (pain).    Past Month at Unknown time  . ondansetron  (ZOFRAN) 4 MG tablet Take 4 mg by mouth every 8 (eight) hours as needed for nausea or vomiting (vomiting).    Past Month at Unknown time  . oxyCODONE-acetaminophen (PERCOCET/ROXICET) 5-325 MG per tablet 1 to 2 tabs PO q6hrs  PRN for pain (Patient not taking: Reported on 02/17/2014) 13 tablet 0   . traMADol (ULTRAM) 50 MG tablet Take 1 tablet (50 mg total) by mouth every 6 (six) hours as needed. (Patient not taking: Reported on 02/17/2014) 15 tablet 0     Review of Systems  Constitutional: Negative for fever and chills.  Respiratory: Negative for shortness of breath.   Cardiovascular: Negative for chest pain.  Gastrointestinal: Positive for nausea. Negative for vomiting and abdominal pain.  Genitourinary: Negative for dysuria and frequency.  Neurological: Negative for headaches.   Physical Exam   Blood pressure 143/84, pulse 64, temperature 98.4 F (36.9 C), temperature source Oral, resp. rate 18, height 5\' 3"  (1.6 m), weight 50.973 kg (112 lb 6 oz), last menstrual period 12/31/2013, not currently breastfeeding.  Physical Exam  Constitutional: She is oriented to person, place, and time. She appears well-developed and well-nourished.  HENT:  Head: Normocephalic and atraumatic.  Neck: Normal range of motion.  Cardiovascular: Normal rate, regular rhythm and normal heart sounds.   Respiratory: Effort normal and breath sounds normal. No respiratory distress.  GI: Soft.  Bowel sounds are normal. There is no tenderness.  Genitourinary: Cervix exhibits no motion tenderness. Right adnexum displays no tenderness. Left adnexum displays no tenderness. Vaginal discharge found.  Neurological: She is alert and oriented to person, place, and time.  Skin: Skin is warm and dry.   Results for orders placed or performed during the hospital encounter of 02/17/14 (from the past 24 hour(s))  Urinalysis, Routine w reflex microscopic     Status: Abnormal   Collection Time: 02/17/14 11:52 AM  Result Value Ref  Range   Color, Urine YELLOW YELLOW   APPearance HAZY (A) CLEAR   Specific Gravity, Urine 1.025 1.005 - 1.030   pH 6.0 5.0 - 8.0   Glucose, UA NEGATIVE NEGATIVE mg/dL   Hgb urine dipstick NEGATIVE NEGATIVE   Bilirubin Urine NEGATIVE NEGATIVE   Ketones, ur NEGATIVE NEGATIVE mg/dL   Protein, ur NEGATIVE NEGATIVE mg/dL   Urobilinogen, UA 0.2 0.0 - 1.0 mg/dL   Nitrite NEGATIVE NEGATIVE   Leukocytes, UA NEGATIVE NEGATIVE  Pregnancy, urine POC     Status: Abnormal   Collection Time: 02/17/14 11:57 AM  Result Value Ref Range   Preg Test, Ur POSITIVE (A) NEGATIVE  Wet prep, genital     Status: None   Collection Time: 02/17/14  1:05 PM  Result Value Ref Range   Yeast Wet Prep HPF POC NONE SEEN NONE SEEN   Trich, Wet Prep NONE SEEN NONE SEEN   Clue Cells Wet Prep HPF POC NONE SEEN NONE SEEN   WBC, Wet Prep HPF POC NONE SEEN NONE SEEN  CBC     Status: None   Collection Time: 02/17/14  1:35 PM  Result Value Ref Range   WBC 6.8 4.0 - 10.5 K/uL   RBC 4.42 3.87 - 5.11 MIL/uL   Hemoglobin 12.0 12.0 - 15.0 g/dL   HCT 91.437.2 78.236.0 - 95.646.0 %   MCV 84.2 78.0 - 100.0 fL   MCH 27.1 26.0 - 34.0 pg   MCHC 32.3 30.0 - 36.0 g/dL   RDW 21.314.7 08.611.5 - 57.815.5 %   Platelets 388 150 - 400 K/uL  hCG, quantitative, pregnancy     Status: Abnormal   Collection Time: 02/17/14  1:35 PM  Result Value Ref Range   hCG, Beta Chain, Quant, S 207 (H) <5 mIU/mL   [place] Ob Comp Less 14 Wks  02/17/2014   CLINICAL DATA:  Abdominal and pelvic pain. Positive beta HCG examination.  EXAM: OBSTETRIC <14 WK US AND TRANSVAGINAL OB US  TECHNIQUE: Both transabdominal and transvaginal ultrasound examinations were performed for complete evaluation of the gestation as well as the maternal uterus, adnexal regions, and pelvic cul-de-sac. Transvaginal technique was performed to assess potential early pregnancy.  COMPARISON:  None.  FINDINGS: Intrauterine gestational sac:  Not visualized  Yolk sac:  Not visualized  Embryo:  Not visualized  Maternal  uterus/adnexae: Uterus is anteverted. Uterus measures 7.0 x 5.6 x 5.7 cm. There is no intrauterine mass. Cervical os is closed. Left ovary measures 5.4 x 3.4 x 2.9 cm. Right ovary measures 4.8 x 2.1 x 3.0 cm. There is no extrauterine pelvic or adnexal mass. There is trace free pelvic fluid.  IMPRESSION: No intrauterine mass or gestation seen. There are no extrauterine pelvic or adnexal masses. There is trace free pelvic fluid.  Differential considerations in this case include intrauterine gestation too early to be seen by either transabdominal or transvaginal technique; recent spontaneous abortion ; possible ectopic gestation. Close clinical and laboratory surveillance advised including  serial beta HCG examinations. Timing for repeat imaging will in large part will depend on beta HCG values.   Electronically Signed   By: Bretta BangWilliam  Woodruff M.D.   On: 02/17/2014 15:02   [place] Ob Transvaginal  02/17/2014   CLINICAL DATA:  Abdominal and pelvic pain. Positive beta HCG examination.  EXAM: OBSTETRIC <14 WK US AND TRANSVAGINAL OB US  TECHNIQUE: Both transabdominal and transvaginal ultrasound examinations were performed for complete evaluation of the gestation as well as the maternal uterus, adnexal regions, and pelvic cul-de-sac. Transvaginal technique was performed to assess potential early pregnancy.  COMPARISON:  None.  FINDINGS: Intrauterine gestational sac:  Not visualized  Yolk sac:  Not visualized  Embryo:  Not visualized  Maternal uterus/adnexae: Uterus is anteverted. Uterus measures 7.0 x 5.6 x 5.7 cm. There is no intrauterine mass. Cervical os is closed. Left ovary measures 5.4 x 3.4 x 2.9 cm. Right ovary measures 4.8 x 2.1 x 3.0 cm. There is no extrauterine pelvic or adnexal mass. There is trace free pelvic fluid.  IMPRESSION: No intrauterine mass or gestation seen. There are no extrauterine pelvic or adnexal masses. There is trace free pelvic fluid.  Differential considerations in this case include intrauterine  gestation too early to be seen by either transabdominal or transvaginal technique; recent spontaneous abortion ; possible ectopic gestation. Close clinical and laboratory surveillance advised including serial beta HCG examinations. Timing for repeat imaging will in large part will depend on beta HCG values.   Electronically Signed   By: Bretta BangWilliam  Woodruff M.D.   On: 02/17/2014 15:02     MAU Course  Procedures  MDM Due to complaints of abdominal cramping and a positive pregnancy test, an ectopic pregnancy work up is warranted.  Blood type A positive  Assessment and Plan    A: Pregnancy of Unknown Location  P: Above labs and ultrasound were reviewed with patient and FOB Miscarriage precautions Return in 48 hours for repeat BHCG Return to MAU with bleeding/ pain as needed  Karma LewFong, Chelsea K 02/17/2014, 12:55 PM

## 2014-02-17 NOTE — MAU Note (Signed)
Pt states here for lower abd pain on right side only. Denies bleeding. Has had abnormal discharge. No menstrual cycle since 12/31/2013.

## 2014-02-18 LAB — GC/CHLAMYDIA PROBE AMP
CT PROBE, AMP APTIMA: POSITIVE — AB
GC PROBE AMP APTIMA: NEGATIVE

## 2014-02-20 ENCOUNTER — Emergency Department (INDEPENDENT_AMBULATORY_CARE_PROVIDER_SITE_OTHER)
Admission: EM | Admit: 2014-02-20 | Discharge: 2014-02-20 | Disposition: A | Discharge status: Home / Self Care | Payer: Medicaid Other | Source: Home / Self Care | Attending: Family Medicine | Admitting: Family Medicine

## 2014-02-20 ENCOUNTER — Encounter (HOSPITAL_COMMUNITY): Payer: Self-pay | Admitting: Family Medicine

## 2014-02-20 DIAGNOSIS — K047 Periapical abscess without sinus: Secondary | ICD-10-CM

## 2014-02-20 MED ORDER — FLUCONAZOLE 150 MG PO TABS: 150.0000 mg | ORAL_TABLET | Freq: Every day | ORAL | Status: DC

## 2014-02-20 MED ORDER — TRAMADOL HCL 50 MG PO TABS: 50.0000 mg | ORAL_TABLET | Freq: Four times a day (QID) | ORAL | Status: DC | PRN

## 2014-02-20 MED ORDER — AMOXICILLIN 500 MG PO CAPS: 500.0000 mg | ORAL_CAPSULE | Freq: Three times a day (TID) | ORAL | Status: DC

## 2014-02-20 NOTE — Discharge Instructions (Signed)
Your gums are likely painful in part from your teeth coming through the skin as well as from a mild infeciton Please start the amoxicillin for the infection Please manage your pain with ibuprofen 600-800mg  every 6-8 hours Please use the tramadol only for extreme pain Please call the dentist for an appointment Please use the diflucan if you get a yeast infection.  Please use a daily mouth wash

## 2014-02-20 NOTE — ED Notes (Signed)
Pt states that her right side of her mouth have been hurting. Pt thinks it "may be her wisdom teeth coming in" pt states that she has not been to a dentist for evaluation

## 2014-02-20 NOTE — ED Provider Notes (Addendum)
CSN: [id number]     Arrival date & time 02/20/14  1153 History   None    Chief Complaint  Patient presents with  . Dental Pain   (Consider location/radiation/quality/duration/timing/severity/associated sxs/prior Treatment) HPI   Tooth pain: started 7 days ago. Upper R side. Feels like wisdom teeth are coming in. Unable to get appt at dentist. Painful w/ eating. Hot and cold fluids w/o pain. First day felt discharge, but none since. Ibuprofen 600mg  w/ benefit initially, but no longer working. Getting worse overall. Denies fevers, nausea, and vomiting.    Past Medical History  Diagnosis Date  . Depression     hospitalized for 2 weeks after suicide att  . Hx of gonorrhea   . Hypertension     gestational HTN   Past Surgical History  Procedure Laterality Date  . No past surgeries     Family History  Problem Relation Age of Onset  . Hypertension Mother   . Hypertension Maternal Aunt   . Hypertension Maternal Grandmother   . Depression Cousin    History  Substance Use Topics  . Smoking status: Never Smoker   . Smokeless tobacco: Never Used  . Alcohol Use: No   OB History    Gravida Para Term Preterm AB TAB SAB Ectopic Multiple Living   4 2 2  0 1 0 1 0 0 2     Review of Systems Per HPI with all other pertinent systems negative.   Allergies  Review of patient's allergies indicates no known allergies.  Home Medications   Prior to Admission medications   Medication Sig Start Date End Date Taking? Authorizing Provider  levonorgestrel (MIRENA) 20 MCG/24HR IUD 1 each by Intrauterine route once.   Yes Historical Provider, MD  amoxicillin (AMOXIL) 500 MG capsule Take 1 capsule (500 mg total) by mouth 3 (three) times daily. 02/20/14   Ozella Rocksavid J Shaquera Ansley, MD  fluconazole (DIFLUCAN) 150 MG tablet Take 1 tablet (150 mg total) by mouth daily. Repeat dose in 3 days 02/20/14   Ozella Rocksavid J Aureliano Oshields, MD  traMADol (ULTRAM) 50 MG tablet Take 1 tablet (50 mg total) by mouth every 6 (six) hours as  needed. 02/20/14   Ozella Rocksavid J Gagandeep Kossman, MD   BP 124/70 mmHg  Pulse 74  Temp(Src) 98.2 F (36.8 C) (Oral)  Resp 14  SpO2 100%  LMP 12/31/2013 Physical Exam  Constitutional: She is oriented to person, place, and time. She appears well-developed and well-nourished. No distress.  HENT:  R gingival ttp posterior to the 3rd maxillary molar. No purulenc eo tooth eruption noted.   Eyes: EOM are normal. Pupils are equal, round, and reactive to light.  Neck: Normal range of motion.  Cardiovascular: Normal rate.   No murmur heard. Pulmonary/Chest: Effort normal and breath sounds normal.  Abdominal: Soft. She exhibits no distension.  Musculoskeletal: Normal range of motion. She exhibits no edema or tenderness.  Neurological: She is alert and oriented to person, place, and time.  Skin: Skin is warm. She is not diaphoretic.  Psychiatric: She has a normal mood and affect. Her behavior is normal. Judgment and thought content normal.    ED Course  Procedures (including critical care time) Labs Review Labs Reviewed - No data to display  Imaging Review No results found.   MDM   1. Dental infection    AMox Iburprofen at home Daily mouth wash F/u Dentist for probably wisdom tooth extraction Tramadol only for extreme pain Diflucan if needed for yeast infection  Precautions given and  all questions answered   Shelly Flattenavid Giovani Neumeister, MD Family Medicine 02/20/2014, 12:28 PM   ------------------------------------------------------------- Addendum  Pt adamently denied being pregnant during appointment and that she has a Mirena. Called after leaving and discussed [facility] visit from 02/17/14. Pt states that she was unaware of her pregnancy results and her Chlamydia result as +. Pt w/ mother at time of appt and phone call and unsure if she doesn't want her to know. Of note, the US didn't show any fetal tissue or mirena, and there was concern for a pregnancy of unknown origin per the note. bHcg was 205 at that  time. Pt was supposed to return to [facility] in 24 hrs from that appt (02/17/14) for repead blood work. Pt instructed not to take the Tramadol Amox OK Take only 400mg  Ibuprofen for pain Pt agreed to call [facility] for treatment of CHlamydia and to return for blood work for pregnancy  Shelly Flattenavid Zahriyah Joo, MD Family Medicine 02/20/2014, 12:54 PM    Ozella Rocksavid J Marrissa Dai, MD 02/20/14 1254

## 2014-03-04 ENCOUNTER — Telehealth: Payer: Self-pay | Admitting: *Deleted

## 2014-03-04 NOTE — Telephone Encounter (Signed)
Patient called to schedule NOB appointment. [facility] Upon investigation into the patient's chart it shows no IUP on ultrasound at thetime of her visit to [facility] on 02-17-14. Patient advised per office policy that we are unable to schedule an appointment until it has been determined that she has a viable pregnancy. Patient advised she would need to follow up with [facility].

## 2014-03-10 ENCOUNTER — Emergency Department (HOSPITAL_COMMUNITY)
Admission: EM | Admit: 2014-03-10 | Discharge: 2014-03-10 | Disposition: A | Discharge status: Home / Self Care | Payer: Medicaid Other | Attending: Emergency Medicine | Admitting: Emergency Medicine

## 2014-03-10 ENCOUNTER — Emergency Department (HOSPITAL_COMMUNITY): Payer: Medicaid Other

## 2014-03-10 ENCOUNTER — Encounter (HOSPITAL_COMMUNITY): Payer: Self-pay | Admitting: *Deleted

## 2014-03-10 DIAGNOSIS — O99341 Other mental disorders complicating pregnancy, first trimester: Secondary | ICD-10-CM | POA: Insufficient documentation

## 2014-03-10 DIAGNOSIS — Z3A09 9 weeks gestation of pregnancy: Secondary | ICD-10-CM | POA: Diagnosis not present

## 2014-03-10 DIAGNOSIS — Z79899 Other long term (current) drug therapy: Secondary | ICD-10-CM | POA: Diagnosis not present

## 2014-03-10 DIAGNOSIS — I10 Essential (primary) hypertension: Secondary | ICD-10-CM | POA: Diagnosis not present

## 2014-03-10 DIAGNOSIS — F329 Major depressive disorder, single episode, unspecified: Secondary | ICD-10-CM | POA: Diagnosis not present

## 2014-03-10 DIAGNOSIS — O99411 Diseases of the circulatory system complicating pregnancy, first trimester: Secondary | ICD-10-CM | POA: Diagnosis not present

## 2014-03-10 DIAGNOSIS — Z8619 Personal history of other infectious and parasitic diseases: Secondary | ICD-10-CM | POA: Insufficient documentation

## 2014-03-10 DIAGNOSIS — Z792 Long term (current) use of antibiotics: Secondary | ICD-10-CM | POA: Diagnosis not present

## 2014-03-10 DIAGNOSIS — R102 Pelvic and perineal pain: Secondary | ICD-10-CM | POA: Insufficient documentation

## 2014-03-10 DIAGNOSIS — O9989 Other specified diseases and conditions complicating pregnancy, childbirth and the puerperium: Secondary | ICD-10-CM | POA: Insufficient documentation

## 2014-03-10 LAB — CBC WITH DIFFERENTIAL/PLATELET
BASOS ABS: 0 10*3/uL (ref 0.0–0.1)
Basophils Relative: 0 % (ref 0–1)
Eosinophils Absolute: 0.1 10*3/uL (ref 0.0–0.7)
Eosinophils Relative: 2 % (ref 0–5)
HCT: 33.7 % — ABNORMAL LOW (ref 36.0–46.0)
HEMOGLOBIN: 10.7 g/dL — AB (ref 12.0–15.0)
LYMPHS ABS: 1.3 10*3/uL (ref 0.7–4.0)
LYMPHS PCT: 29 % (ref 12–46)
MCH: 26.8 pg (ref 26.0–34.0)
MCHC: 31.8 g/dL (ref 30.0–36.0)
MCV: 84.5 fL (ref 78.0–100.0)
MONO ABS: 0.3 10*3/uL (ref 0.1–1.0)
MONOS PCT: 7 % (ref 3–12)
NEUTROS ABS: 2.6 10*3/uL (ref 1.7–7.7)
Neutrophils Relative %: 62 % (ref 43–77)
Platelets: 347 10*3/uL (ref 150–400)
RBC: 3.99 MIL/uL (ref 3.87–5.11)
RDW: 14 % (ref 11.5–15.5)
WBC: 4.3 10*3/uL (ref 4.0–10.5)

## 2014-03-10 LAB — COMPREHENSIVE METABOLIC PANEL
ALBUMIN: 3.8 g/dL (ref 3.5–5.2)
ALT: 11 U/L (ref 0–35)
ANION GAP: 13 (ref 5–15)
AST: 18 U/L (ref 0–37)
Alkaline Phosphatase: 55 U/L (ref 39–117)
BUN: 13 mg/dL (ref 6–23)
CO2: 24 mEq/L (ref 19–32)
CREATININE: 0.74 mg/dL (ref 0.50–1.10)
Calcium: 9.6 mg/dL (ref 8.4–10.5)
Chloride: 100 mEq/L (ref 96–112)
GFR calc Af Amer: >90 mL/min (ref >90)
GFR calc non Af Amer: >90 mL/min (ref >90)
Glucose, Bld: 63 mg/dL — ABNORMAL LOW (ref 70–99)
Potassium: 3.6 mEq/L — ABNORMAL LOW (ref 3.7–5.3)
Sodium: 137 mEq/L (ref 137–147)
TOTAL PROTEIN: 7.4 g/dL (ref 6.0–8.3)
Total Bilirubin: 0.7 mg/dL (ref 0.3–1.2)

## 2014-03-10 LAB — HCG, QUANTITATIVE, PREGNANCY: hCG, Beta Chain, Quant, S: 56346 m[IU]/mL — ABNORMAL HIGH (ref <5)

## 2014-03-10 LAB — WET PREP, GENITAL
CLUE CELLS WET PREP: NONE SEEN
Trich, Wet Prep: NONE SEEN
Yeast Wet Prep HPF POC: NONE SEEN

## 2014-03-10 LAB — URINALYSIS, ROUTINE W REFLEX MICROSCOPIC
BILIRUBIN URINE: NEGATIVE
Glucose, UA: NEGATIVE mg/dL
Hgb urine dipstick: NEGATIVE
KETONES UR: NEGATIVE mg/dL
NITRITE: NEGATIVE
PH: 5.5 (ref 5.0–8.0)
Protein, ur: NEGATIVE mg/dL
SPECIFIC GRAVITY, URINE: 1.027 (ref 1.005–1.030)
UROBILINOGEN UA: 1 mg/dL (ref 0.0–1.0)

## 2014-03-10 LAB — POC URINE PREG, ED: PREG TEST UR: POSITIVE — AB

## 2014-03-10 LAB — URINE MICROSCOPIC-ADD ON

## 2014-03-10 LAB — LIPASE, BLOOD: Lipase: 41 U/L (ref 11–59)

## 2014-03-10 NOTE — ED Notes (Signed)
EDPA aware pelvic set up complete

## 2014-03-10 NOTE — Progress Notes (Signed)
  CARE MANAGEMENT ED NOTE 03/10/2014  Patient:  Sharon Johnston,Sharon Johnston   Account Number:  [id number]  Date Initiated:  03/10/2014  Documentation initiated by:  Radford PaxFERRERO,Kambrey Hagger  Subjective/Objective Assessment:   Patient presents to Ed with right sided abdominal pain     Subjective/Objective Assessment Detail:   Ultasound of abdomen: Intrauterine gestational sac, yolk sac, and fetal pole identified  with best assigned gestational age by today's crown-rump length of 7  weeks 3 days. No acute abnormality.     Action/Plan:   Action/Plan Detail:   Anticipated DC Date:  03/10/2014     Status Recommendation to Physician:   Result of Recommendation:    Other ED Services  Consult Working Plan    DC Planning Services  Other  PCP issues    Choice offered to / List presented to:            Status of service:  Completed, signed off  ED Comments:   ED Comments Detail:  EDCM spoke to patient at bedside.  Patient confirms she does not have a pcp.  [facility]  [facility] who [facility] insurance.  EDCM discussed impotance and purpose of pcp.  [facility]  No further EDCM needs at this time.

## 2014-03-10 NOTE — Discharge Instructions (Signed)

## 2014-03-10 NOTE — ED Notes (Addendum)
Pt reports she is [redacted] weeks pregnant. G3P2. Right sided abdominal since yesterday. Pain 5/10. Denies n/v. Pt has sexual intercourse today.

## 2014-03-10 NOTE — ED Provider Notes (Signed)
CSN: [id number]     Arrival date & time 03/10/14  1658 History   First MD Initiated Contact with Patient 03/10/14 1804     Chief Complaint  Patient presents with  . Abdominal Pain  . 9 weeks preg      (Consider location/radiation/quality/duration/timing/severity/associated sxs/prior Treatment) HPI Comments: G3P2 [redacted] week pregnant female presents emergency department with chief complaints of right sided abdominal pain that started yesterday. She states the pain is intermittent and crampy. She states pain is 5 out of 10. She denies any associated fevers, chills, nausea, vomiting, diarrhea, vaginal bleeding, vaginal discharge, or dysuria.  She has not tried taking anything to alleviate symptoms. She has not had an OB/GYN appointment yet.  The history is provided by the patient. No language interpreter was used.    Past Medical History  Diagnosis Date  . Depression     hospitalized for 2 weeks after suicide att  . Hx of gonorrhea   . Hypertension     gestational HTN   Past Surgical History  Procedure Laterality Date  . No past surgeries     Family History  Problem Relation Age of Onset  . Hypertension Mother   . Hypertension Maternal Aunt   . Hypertension Maternal Grandmother   . Depression Cousin    History  Substance Use Topics  . Smoking status: Never Smoker   . Smokeless tobacco: Never Used  . Alcohol Use: No   OB History    Gravida Para Term Preterm AB TAB SAB Ectopic Multiple Living   4 2 2  0 1 0 1 0 0 2     Review of Systems  Constitutional: Negative for fever and chills.  Respiratory: Negative for shortness of breath.   Cardiovascular: Negative for chest pain.  Gastrointestinal: Negative for nausea, vomiting, diarrhea and constipation.  Genitourinary: Positive for pelvic pain. Negative for dysuria.  All other systems reviewed and are negative.     Allergies  Review of patient's allergies indicates no known allergies.  Home Medications   Prior to  Admission medications   Medication Sig Start Date End Date Taking? Authorizing Provider  amoxicillin (AMOXIL) 500 MG capsule Take 1 capsule (500 mg total) by mouth 3 (three) times daily. 02/20/14   Ozella Rocksavid J Merrell, MD  fluconazole (DIFLUCAN) 150 MG tablet Take 1 tablet (150 mg total) by mouth daily. Repeat dose in 3 days 02/20/14   Ozella Rocksavid J Merrell, MD  levonorgestrel [facility](MIRENA) 20 MCG/24HR IUD 1 each by Intrauterine route once.    Historical Provider, MD  traMADol (ULTRAM) 50 MG tablet Take 1 tablet (50 mg total) by mouth every 6 (six) hours as needed. 02/20/14   Ozella Rocksavid J Merrell, MD   BP 131/70 mmHg  Pulse 108  Temp(Src) 97.7 F (36.5 C) (Oral)  Resp 16  SpO2 100%  LMP 12/31/2013 Physical Exam  Constitutional: She is oriented to person, place, and time. She appears well-developed and well-nourished.  HENT:  Head: Normocephalic and atraumatic.  Eyes: Conjunctivae and EOM are normal. Pupils are equal, round, and reactive to light.  Neck: Normal range of motion. Neck supple.  Cardiovascular: Normal rate and regular rhythm.  Exam reveals no gallop and no friction rub.   No murmur heard. Pulmonary/Chest: Effort normal and breath sounds normal. No respiratory distress. She has no wheezes. She has no rales. She exhibits no tenderness.  Abdominal: Soft. Bowel sounds are normal. She exhibits no distension and no mass. There is no tenderness. There is no rebound and no guarding.  Genitourinary:  Pelvic exam chaperoned by female ER tech, no right or left adnexal tenderness, no uterine tenderness, no vaginal discharge, no bleeding, no CMT or friability, no foreign body, no injury to the external genitalia, no other significant findings   Musculoskeletal: Normal range of motion. She exhibits no edema or tenderness.  Neurological: She is alert and oriented to person, place, and time.  Skin: Skin is warm and dry.  Psychiatric: She has a normal mood and affect. Her behavior is normal. Judgment and thought  content normal.  Nursing note and vitals reviewed.   ED Course  Procedures (including critical care time) Labs Review Labs Reviewed  POC URINE PREG, ED - Abnormal; Notable for the following:    Preg Test, Ur POSITIVE (*)    All other components within normal limits  WET PREP, GENITAL  GC/CHLAMYDIA PROBE AMP  URINALYSIS, ROUTINE W REFLEX MICROSCOPIC  COMPREHENSIVE METABOLIC PANEL  CBC WITH DIFFERENTIAL  LIPASE, BLOOD  HCG, QUANTITATIVE, PREGNANCY    Imaging Review No results found.   EKG Interpretation None      MDM   Final diagnoses:  Adnexal pain   Patient with right adnexal tenderness, no real abdominal tenderness, abdomen is soft, doubt surgical or acute abdomen. Will check pelvic exam, and plan for ultrasound. Patient is well-appearing, not in any apparent distress. Vital signs are stable. No nausea, vomiting, vaginal discharge, or bleeding. Anticipate discharge to home with OB/GYN follow-up.  7:57 PM Labs and US reassuring.  No bleeding.  Patient seen by and discussed with Dr. Donnald GarrePfeiffer, who agrees with plan for discharge and OBGYN follow-up.    Roxy Horsemanobert Rosalea Withrow, PA-C 03/10/14 1957  Arby BarretteMarcy Pfeiffer, MD 03/10/14 [id number]  Arby BarretteMarcy Pfeiffer, MD 03/10/14 [phone]

## 2014-03-11 ENCOUNTER — Other Ambulatory Visit: Payer: Medicaid Other

## 2014-03-17 ENCOUNTER — Encounter: Payer: Self-pay | Admitting: *Deleted

## 2014-03-17 ENCOUNTER — Other Ambulatory Visit: Payer: Medicaid Other

## 2014-03-21 NOTE — L&D Delivery Note (Signed)
Delivery Note At 3:14 AM a viable female was delivered via  (Presentation: Occiput anterior;  ).  APGAR:9 ,9 ; weight pending .   Placenta status:intact ,spontaneous .  Cord: 3 vessel cord with the following complications:none .  Cord pH: N/A  Anesthesia:  none Episiotomy:  none Lacerations:  none Suture Repair: none Est. Blood Loss (mL):  76  Mom to postpartum.  Baby to Couplet care / Skin to Skin.  [name] 10/08/2014, 3:28 AM

## 2014-03-31 ENCOUNTER — Encounter (HOSPITAL_COMMUNITY): Payer: Self-pay

## 2014-03-31 ENCOUNTER — Inpatient Hospital Stay (HOSPITAL_COMMUNITY)
Admission: AD | Admit: 2014-03-31 | Discharge: 2014-03-31 | Disposition: A | Discharge status: Home / Self Care | Payer: Medicaid Other | Source: Ambulatory Visit | Attending: Obstetrics & Gynecology | Admitting: Obstetrics & Gynecology

## 2014-03-31 DIAGNOSIS — Z3A10 10 weeks gestation of pregnancy: Secondary | ICD-10-CM | POA: Insufficient documentation

## 2014-03-31 DIAGNOSIS — O26851 Spotting complicating pregnancy, first trimester: Secondary | ICD-10-CM | POA: Insufficient documentation

## 2014-03-31 LAB — URINALYSIS, ROUTINE W REFLEX MICROSCOPIC
BILIRUBIN URINE: NEGATIVE
Glucose, UA: NEGATIVE mg/dL
Ketones, ur: NEGATIVE mg/dL
Leukocytes, UA: NEGATIVE
Nitrite: POSITIVE — AB
Protein, ur: NEGATIVE mg/dL
SPECIFIC GRAVITY, URINE: 1.02 (ref 1.005–1.030)
UROBILINOGEN UA: 0.2 mg/dL (ref 0.0–1.0)
pH: 6 (ref 5.0–8.0)

## 2014-03-31 LAB — URINE MICROSCOPIC-ADD ON

## 2014-03-31 NOTE — Discharge Instructions (Signed)
First Trimester of Pregnancy The first trimester of pregnancy is from week 1 until the end of week 12 (months 1 through 3). A week after a sperm fertilizes an egg, the egg will implant on the wall of the uterus. This embryo will begin to develop into a baby. Genes from you and your partner are forming the baby. The female genes determine whether the baby is a boy or a girl. At 6-8 weeks, the eyes and face are formed, and the heartbeat can be seen on ultrasound. At the end of 12 weeks, all the baby's organs are formed.  Now that you are pregnant, you will want to do everything you can to have a healthy baby. Two of the most important things are to get good prenatal care and to follow your health care provider's instructions. Prenatal care is all the medical care you receive before the baby's birth. This care will help prevent, find, and treat any problems during the pregnancy and childbirth. BODY CHANGES Your body goes through many changes during pregnancy. The changes vary from woman to woman.   You may gain or lose a couple of pounds at first.  You may feel sick to your stomach (nauseous) and throw up (vomit). If the vomiting is uncontrollable, call your health care provider.  You may tire easily.  You may develop headaches that can be relieved by medicines approved by your health care provider.  You may urinate more often. Painful urination may mean you have a bladder infection.  You may develop heartburn as a result of your pregnancy.  You may develop constipation because certain hormones are causing the muscles that push waste through your intestines to slow down.  You may develop hemorrhoids or swollen, bulging veins (varicose veins).  Your breasts may begin to grow larger and become tender. Your nipples may stick out more, and the tissue that surrounds them (areola) may become darker.  Your gums may bleed and may be sensitive to brushing and flossing.  Dark spots or blotches (chloasma,  mask of pregnancy) may develop on your face. This will likely fade after the baby is born.  Your menstrual periods will stop.  You may have a loss of appetite.  You may develop cravings for certain kinds of food.  You may have changes in your emotions from day to day, such as being excited to be pregnant or being concerned that something may go wrong with the pregnancy and baby.  You may have more vivid and strange dreams.  You may have changes in your hair. These can include thickening of your hair, rapid growth, and changes in texture. Some women also have hair loss during or after pregnancy, or hair that feels dry or thin. Your hair will most likely return to normal after your baby is born. WHAT TO EXPECT AT YOUR PRENATAL VISITS During a routine prenatal visit:  You will be weighed to make sure you and the baby are growing normally.  Your blood pressure will be taken.  Your abdomen will be measured to track your baby's growth.  The fetal heartbeat will be listened to starting around week 10 or 12 of your pregnancy.  Test results from any previous visits will be discussed. Your health care provider may ask you:  How you are feeling.  If you are feeling the baby move.  If you have had any abnormal symptoms, such as leaking fluid, bleeding, severe headaches, or abdominal cramping.  If you have any questions. Other tests   that may be performed during your first trimester include:  Blood tests to find your blood type and to check for the presence of any previous infections. They will also be used to check for low iron levels (anemia) and Rh antibodies. Later in the pregnancy, blood tests for diabetes will be done along with other tests if problems develop.  Urine tests to check for infections, diabetes, or protein in the urine.  An ultrasound to confirm the proper growth and development of the baby.  An amniocentesis to check for possible genetic problems.  Fetal screens for  spina bifida and Down syndrome.  You may need other tests to make sure you and the baby are doing well. HOME CARE INSTRUCTIONS  Medicines  Follow your health care provider's instructions regarding medicine use. Specific medicines may be either safe or unsafe to take during pregnancy.  Take your prenatal vitamins as directed.  If you develop constipation, try taking a stool softener if your health care provider approves. Diet  Eat regular, well-balanced meals. Choose a variety of foods, such as meat or vegetable-based protein, fish, milk and low-fat dairy products, vegetables, fruits, and whole grain breads and cereals. Your health care provider will help you determine the amount of weight gain that is right for you.  Avoid raw meat and uncooked cheese. These carry germs that can cause birth defects in the baby.  Eating four or five small meals rather than three large meals a day may help relieve nausea and vomiting. If you start to feel nauseous, eating a few soda crackers can be helpful. Drinking liquids between meals instead of during meals also seems to help nausea and vomiting.  If you develop constipation, eat more high-fiber foods, such as fresh vegetables or fruit and whole grains. Drink enough fluids to keep your urine clear or pale yellow. Activity and Exercise  Exercise only as directed by your health care provider. Exercising will help you:  Control your weight.  Stay in shape.  Be prepared for labor and delivery.  Experiencing pain or cramping in the lower abdomen or low back is a good sign that you should stop exercising. Check with your health care provider before continuing normal exercises.  Try to avoid standing for long periods of time. Move your legs often if you must stand in one place for a long time.  Avoid heavy lifting.  Wear low-heeled shoes, and practice good posture.  You may continue to have sex unless your health care provider directs you  otherwise. Relief of Pain or Discomfort  Wear a good support bra for breast tenderness.   Take warm sitz baths to soothe any pain or discomfort caused by hemorrhoids. Use hemorrhoid cream if your health care provider approves.   Rest with your legs elevated if you have leg cramps or low back pain.  If you develop varicose veins in your legs, wear support hose. Elevate your feet for 15 minutes, 3-4 times a day. Limit salt in your diet. Prenatal Care  Schedule your prenatal visits by the twelfth week of pregnancy. They are usually scheduled monthly at first, then more often in the last 2 months before delivery.  Write down your questions. Take them to your prenatal visits.  Keep all your prenatal visits as directed by your health care provider. Safety  Wear your seat belt at all times when driving.  Make a list of emergency phone numbers, including numbers for family, friends, the hospital, and police and fire departments. General Tips    Ask your health care provider for a referral to a local prenatal education class. Begin classes no later than at the beginning of month 6 of your pregnancy.  Ask for help if you have counseling or nutritional needs during pregnancy. Your health care provider can offer advice or refer you to specialists for help with various needs.  Do not use hot tubs, steam rooms, or saunas.  Do not douche or use tampons or scented sanitary pads.  Do not cross your legs for long periods of time.  Avoid cat litter boxes and soil used by cats. These carry germs that can cause birth defects in the baby and possibly loss of the fetus by miscarriage or stillbirth.  Avoid all smoking, herbs, alcohol, and medicines not prescribed by your health care provider. Chemicals in these affect the formation and growth of the baby.  Schedule a dentist appointment. At home, brush your teeth with a soft toothbrush and be gentle when you floss. SEEK MEDICAL CARE IF:   You have  dizziness.  You have mild pelvic cramps, pelvic pressure, or nagging pain in the abdominal area.  You have persistent nausea, vomiting, or diarrhea.  You have a bad smelling vaginal discharge.  You have pain with urination.  You notice increased swelling in your face, hands, legs, or ankles. SEEK IMMEDIATE MEDICAL CARE IF:   You have a fever.  You are leaking fluid from your vagina.  You have spotting or bleeding from your vagina.  You have severe abdominal cramping or pain.  You have rapid weight gain or loss.  You vomit blood or material that looks like coffee grounds.  You are exposed to German measles and have never had them.  You are exposed to fifth disease or chickenpox.  You develop a severe headache.  You have shortness of breath.  You have any kind of trauma, such as from a fall or a car accident. Document Released: 03/01/2001 Document Revised: 07/22/2013 Document Reviewed: 01/15/2013 [employer] Patient Information 2015 [employer]. This information is not intended to replace advice given to you by your health care provider. Make sure you discuss any questions you have with your health care provider.  

## 2014-03-31 NOTE — [facility]U Note (Signed)
Abdominal pain x 1 hour; lower abdomen. Vaginal spotting when wipes x 30 mins; red. Last intercourse Sunday morning.

## 2014-03-31 NOTE — MAU Provider Note (Signed)
History     CSN: [id number]  Arrival date and time: 03/31/14 4098   First Provider Initiated Contact with Patient 03/31/14 0122      Chief Complaint  Patient presents with  . Abdominal Pain  . Vaginal Bleeding   HPI  Sharon Johnston is a 24 y.o. [id number] at [redacted]w[redacted]d who presents today with spotting. She states that she had intercourse earlier in the day, and then around 2300 she had some spotting when she wiped. She is planning on going to [facility] for prenatal care, but she has not made an appointment at this time.   Past Medical History  Diagnosis Date  . Depression     hospitalized for 2 weeks after suicide att  . Hx of gonorrhea   . Hypertension     gestational HTN    Past Surgical History  Procedure Laterality Date  . No past surgeries      Family History  Problem Relation Age of Onset  . Hypertension Mother   . Hypertension Maternal Aunt   . Hypertension Maternal Grandmother   . Depression Cousin     History  Substance Use Topics  . Smoking status: Never Smoker   . Smokeless tobacco: Never Used  . Alcohol Use: No    Allergies: No Known Allergies  Prescriptions prior to admission  Medication Sig Dispense Refill Last Dose  . Prenatal Vit-Fe Fumarate-FA (PRENATAL MULTIVITAMIN) TABS tablet Take 1 tablet by mouth daily at 12 noon.   03/30/2014 at Unknown time    ROS Physical Exam   Blood pressure 132/82, pulse 67, temperature 98.4 F (36.9 C), temperature source Oral, resp. rate 18, last menstrual period 12/31/2013, not currently breastfeeding.  Physical Exam  Nursing note and vitals reviewed. Constitutional: She appears well-developed and well-nourished. No distress.  Cardiovascular: Normal rate.   Respiratory: Effort normal.  GI: Soft. There is no tenderness. There is no rebound.  Genitourinary:  Fundus at the symphysis, FHT 160 with doppler   Neurological: She is alert.  Skin: Skin is warm and dry.  Psychiatric: She has a normal mood and affect.     MAU Course  Procedures   Results for orders placed or performed during the hospital encounter of 03/31/14 (from the past 24 hour(s))  Urinalysis, Routine w reflex microscopic     Status: Abnormal   Collection Time: 03/31/14  1:10 AM  Result Value Ref Range   Color, Urine YELLOW YELLOW   APPearance CLEAR CLEAR   Specific Gravity, Urine 1.020 1.005 - 1.030   pH 6.0 5.0 - 8.0   Glucose, UA NEGATIVE NEGATIVE mg/dL   Hgb urine dipstick TRACE (A) NEGATIVE   Bilirubin Urine NEGATIVE NEGATIVE   Ketones, ur NEGATIVE NEGATIVE mg/dL   Protein, ur NEGATIVE NEGATIVE mg/dL   Urobilinogen, UA 0.2 0.0 - 1.0 mg/dL   Nitrite POSITIVE (A) NEGATIVE   Leukocytes, UA NEGATIVE NEGATIVE  Urine microscopic-add on     Status: Abnormal   Collection Time: 03/31/14  1:10 AM  Result Value Ref Range   Squamous Epithelial / LPF FEW (A) RARE   WBC, UA 0-2 <3 WBC/hpf   RBC / HPF 3-6 <3 RBC/hpf   Bacteria, UA MANY (A) RARE    Assessment and Plan   1. Spotting affecting pregnancy in first trimester    Pelvic rest until able to establish appointment with OBGYN Return to MAU as needed First trimester precautions Urine culture pending   Follow-up Information    Follow up with [facility].  Specialty:  Obstetrics and Gynecology   Contact information:   [address], [address] [place] [id number] [phone]      Tawnya CrookHogan, Shahad Mazurek Donovan 03/31/2014, 1:26 AM

## 2014-04-02 LAB — URINE CULTURE: Colony Count: >=100000

## 2014-04-03 ENCOUNTER — Telehealth: Payer: Self-pay | Admitting: Physician Assistant

## 2014-04-03 MED ORDER — CEPHALEXIN 500 MG PO CAPS: 500.0000 mg | ORAL_CAPSULE | Freq: Three times a day (TID) | ORAL | Status: DC

## 2014-04-03 NOTE — Telephone Encounter (Signed)
Urine culture positive.  Keflex prescribed to treat.  Rx sent to pharmacy

## 2014-04-04 LAB — GC/CHLAMYDIA PROBE AMP
CT PROBE, AMP APTIMA: NEGATIVE
GC Probe RNA: NEGATIVE

## 2014-04-07 ENCOUNTER — Telehealth: Payer: Self-pay | Admitting: *Deleted

## 2014-04-07 NOTE — Telephone Encounter (Signed)
Patient is interested in scheduling a NOB appointment. Attempted to contact the patient. Unable to leave a message because the mailbox is full.

## 2014-04-08 ENCOUNTER — Ambulatory Visit (HOSPITAL_COMMUNITY)
Admission: RE | Admit: 2014-04-08 | Discharge: 2014-04-08 | Disposition: A | Discharge status: Home / Self Care | Payer: Medicaid Other | Attending: Psychiatry | Admitting: Psychiatry
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
## 2014-04-14 NOTE — Telephone Encounter (Signed)
Patient stopped by the office today. Patient given NOB appointment for 05-06-14 @ 1 pm. Patient advised of No show policy.

## 2014-04-21 ENCOUNTER — Encounter (HOSPITAL_COMMUNITY): Payer: Self-pay | Admitting: *Deleted

## 2014-04-21 ENCOUNTER — Inpatient Hospital Stay (HOSPITAL_COMMUNITY)
Admission: AD | Admit: 2014-04-21 | Discharge: 2014-04-21 | Disposition: A | Discharge status: Home / Self Care | Payer: Medicaid Other | Source: Ambulatory Visit | Attending: Obstetrics | Admitting: Obstetrics

## 2014-04-21 DIAGNOSIS — O26899 Other specified pregnancy related conditions, unspecified trimester: Secondary | ICD-10-CM

## 2014-04-21 DIAGNOSIS — Z3A13 13 weeks gestation of pregnancy: Secondary | ICD-10-CM | POA: Diagnosis not present

## 2014-04-21 DIAGNOSIS — B373 Candidiasis of vulva and vagina: Secondary | ICD-10-CM

## 2014-04-21 DIAGNOSIS — R102 Pelvic and perineal pain: Secondary | ICD-10-CM

## 2014-04-21 DIAGNOSIS — N898 Other specified noninflammatory disorders of vagina: Secondary | ICD-10-CM | POA: Diagnosis present

## 2014-04-21 DIAGNOSIS — B3731 Acute candidiasis of vulva and vagina: Secondary | ICD-10-CM

## 2014-04-21 DIAGNOSIS — O9989 Other specified diseases and conditions complicating pregnancy, childbirth and the puerperium: Secondary | ICD-10-CM | POA: Insufficient documentation

## 2014-04-21 DIAGNOSIS — N949 Unspecified condition associated with female genital organs and menstrual cycle: Secondary | ICD-10-CM | POA: Diagnosis not present

## 2014-04-21 LAB — WET PREP, GENITAL
Clue Cells Wet Prep HPF POC: NONE SEEN
TRICH WET PREP: NONE SEEN
YEAST WET PREP: NONE SEEN

## 2014-04-21 LAB — URINALYSIS, ROUTINE W REFLEX MICROSCOPIC
Bilirubin Urine: NEGATIVE
GLUCOSE, UA: NEGATIVE mg/dL
HGB URINE DIPSTICK: NEGATIVE
Ketones, ur: NEGATIVE mg/dL
LEUKOCYTES UA: NEGATIVE
Nitrite: NEGATIVE
PROTEIN: NEGATIVE mg/dL
SPECIFIC GRAVITY, URINE: 1.025 (ref 1.005–1.030)
Urobilinogen, UA: 0.2 mg/dL (ref 0.0–1.0)
pH: 6 (ref 5.0–8.0)

## 2014-04-21 MED ORDER — FLUCONAZOLE 150 MG PO TABS: 150.0000 mg | ORAL_TABLET | Freq: Once | ORAL | Status: DC

## 2014-04-21 NOTE — MAU Note (Signed)
Pt presents complaining of cramping that started last night and has continued. Has not tried pain medication. Denies vaginal bleeding. Has thick white vaginal discharge.
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
## 2014-04-22 LAB — GC/CHLAMYDIA PROBE AMP (~~LOC~~) NOT AT ARMC
Chlamydia: NEGATIVE
NEISSERIA GONORRHEA: NEGATIVE

## 2014-05-06 ENCOUNTER — Encounter: Payer: Self-pay | Admitting: Obstetrics

## 2014-05-06 ENCOUNTER — Ambulatory Visit (INDEPENDENT_AMBULATORY_CARE_PROVIDER_SITE_OTHER): Payer: Medicaid Other | Admitting: Obstetrics

## 2014-05-06 VITALS — BP 111/65 | HR 77 | Temp 98.3°F | Wt 119.0 lb

## 2014-05-06 DIAGNOSIS — A5403 Gonococcal cervicitis, unspecified: Secondary | ICD-10-CM

## 2014-05-06 DIAGNOSIS — Z3201 Encounter for pregnancy test, result positive: Secondary | ICD-10-CM | POA: Diagnosis not present

## 2014-05-06 DIAGNOSIS — Z3482 Encounter for supervision of other normal pregnancy, second trimester: Secondary | ICD-10-CM

## 2014-05-06 LAB — POCT URINALYSIS DIPSTICK
Bilirubin, UA: NEGATIVE
Glucose, UA: NEGATIVE
KETONES UA: NEGATIVE
Leukocytes, UA: NEGATIVE
Nitrite, UA: NEGATIVE
PROTEIN UA: NEGATIVE
SPEC GRAV UA: 1.02
Urobilinogen, UA: NEGATIVE
pH, UA: 6

## 2014-05-06 MED ORDER — AZITHROMYCIN 250 MG PO TABS: 1000.0000 mg | ORAL_TABLET | Freq: Once | ORAL | Status: DC

## 2014-05-06 MED ORDER — CEFTRIAXONE SODIUM 1 G IJ SOLR
250.0000 mg | Freq: Once | INTRAMUSCULAR | Status: AC
  Administered 2014-05-06: 250 mg via INTRAMUSCULAR
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
## 2014-05-07 LAB — OBSTETRIC PANEL
Antibody Screen: NEGATIVE
BASOS ABS: 0 10*3/uL (ref 0.0–0.1)
Basophils Relative: 0 % (ref 0–1)
Eosinophils Absolute: 0.1 10*3/uL (ref 0.0–0.7)
Eosinophils Relative: 1 % (ref 0–5)
HCT: 34.8 % — ABNORMAL LOW (ref 36.0–46.0)
Hemoglobin: 11.5 g/dL — ABNORMAL LOW (ref 12.0–15.0)
Hepatitis B Surface Ag: NEGATIVE
LYMPHS PCT: 21 % (ref 12–46)
Lymphs Abs: 1.2 10*3/uL (ref 0.7–4.0)
MCH: 27.4 pg (ref 26.0–34.0)
MCHC: 33 g/dL (ref 30.0–36.0)
MCV: 83.1 fL (ref 78.0–100.0)
MPV: 9.9 fL (ref 8.6–12.4)
Monocytes Absolute: 0.4 10*3/uL (ref 0.1–1.0)
Monocytes Relative: 7 % (ref 3–12)
NEUTROS ABS: 3.9 10*3/uL (ref 1.7–7.7)
Neutrophils Relative %: 71 % (ref 43–77)
Platelets: 339 10*3/uL (ref 150–400)
RBC: 4.19 MIL/uL (ref 3.87–5.11)
RDW: 14.2 % (ref 11.5–15.5)
RUBELLA: 1.7 {index} — AB (ref <0.90)
Rh Type: POSITIVE
WBC: 5.5 10*3/uL (ref 4.0–10.5)

## 2014-05-07 LAB — PAP IG W/ RFLX HPV ASCU

## 2014-05-07 LAB — VARICELLA ZOSTER ANTIBODY, IGG: Varicella IgG: 3775 Index — ABNORMAL HIGH (ref <135.00)

## 2014-05-07 LAB — HIV ANTIBODY (ROUTINE TESTING W REFLEX): HIV 1&2 Ab, 4th Generation: NONREACTIVE

## 2014-05-07 LAB — VITAMIN D 25 HYDROXY (VIT D DEFICIENCY, FRACTURES): VIT D 25 HYDROXY: 13 ng/mL — AB (ref 30–100)

## 2014-05-08 ENCOUNTER — Institutional Professional Consult (permissible substitution): Payer: Medicaid Other | Admitting: Obstetrics

## 2014-05-08 LAB — CULTURE, OB URINE

## 2014-05-08 LAB — HEMOGLOBINOPATHY EVALUATION
HEMOGLOBIN OTHER: 0 %
HGB A2 QUANT: 2.6 % (ref 2.2–3.2)
HGB A: 97.4 % (ref 96.8–97.8)
HGB F QUANT: 0 % (ref 0.0–2.0)
HGB S QUANTITAION: 0 %

## 2014-05-09 ENCOUNTER — Other Ambulatory Visit: Payer: Self-pay | Admitting: Obstetrics

## 2014-05-09 DIAGNOSIS — N760 Acute vaginitis: Principal | ICD-10-CM

## 2014-05-09 DIAGNOSIS — B9689 Other specified bacterial agents as the cause of diseases classified elsewhere: Secondary | ICD-10-CM

## 2014-05-09 DIAGNOSIS — N390 Urinary tract infection, site not specified: Secondary | ICD-10-CM

## 2014-05-09 LAB — SURESWAB, VAGINOSIS/VAGINITIS PLUS
Atopobium vaginae: NOT DETECTED Log (cells/mL)
C. ALBICANS, DNA: NOT DETECTED
C. GLABRATA, DNA: NOT DETECTED
C. TRACHOMATIS RNA, TMA: NOT DETECTED
C. parapsilosis, DNA: NOT DETECTED
C. tropicalis, DNA: NOT DETECTED
Gardnerella vaginalis: 5.2 Log (cells/mL)
LACTOBACILLUS SPECIES: 5.1 Log (cells/mL)
MEGASPHAERA SPECIES: NOT DETECTED Log (cells/mL)
N. gonorrhoeae RNA, TMA: NOT DETECTED
T. vaginalis RNA, QL TMA: NOT DETECTED

## 2014-05-09 MED ORDER — TINIDAZOLE 500 MG PO TABS: 1000.0000 mg | ORAL_TABLET | Freq: Every day | ORAL | Status: DC

## 2014-05-09 MED ORDER — SULFAMETHOXAZOLE-TRIMETHOPRIM 800-160 MG PO TABS: 1.0000 | ORAL_TABLET | Freq: Two times a day (BID) | ORAL | Status: DC

## 2014-05-20 ENCOUNTER — Inpatient Hospital Stay (HOSPITAL_COMMUNITY)
Admission: AD | Admit: 2014-05-20 | Discharge: 2014-05-20 | Discharge status: Against Medical Advice | Payer: Medicaid Other | Source: Ambulatory Visit | Attending: Obstetrics | Admitting: Obstetrics

## 2014-05-20 NOTE — MAU Note (Signed)
Pt not in lobby.  

## 2014-05-22 ENCOUNTER — Encounter (HOSPITAL_COMMUNITY): Payer: Self-pay | Admitting: *Deleted

## 2014-05-22 ENCOUNTER — Inpatient Hospital Stay (HOSPITAL_COMMUNITY)
Admission: AD | Admit: 2014-05-22 | Discharge: 2014-05-22 | Disposition: A | Discharge status: Home / Self Care | Payer: Self-pay | Source: Ambulatory Visit | Attending: Obstetrics | Admitting: Obstetrics

## 2014-05-22 DIAGNOSIS — O4692 Antepartum hemorrhage, unspecified, second trimester: Secondary | ICD-10-CM

## 2014-05-22 DIAGNOSIS — O26852 Spotting complicating pregnancy, second trimester: Secondary | ICD-10-CM | POA: Insufficient documentation

## 2014-05-22 DIAGNOSIS — Z3A17 17 weeks gestation of pregnancy: Secondary | ICD-10-CM

## 2014-05-22 LAB — URINALYSIS, ROUTINE W REFLEX MICROSCOPIC
BILIRUBIN URINE: NEGATIVE
Glucose, UA: NEGATIVE mg/dL
Ketones, ur: NEGATIVE mg/dL
LEUKOCYTES UA: NEGATIVE
Nitrite: NEGATIVE
PROTEIN: NEGATIVE mg/dL
Specific Gravity, Urine: 1.025 (ref 1.005–1.030)
Urobilinogen, UA: 0.2 mg/dL (ref 0.0–1.0)
pH: 6.5 (ref 5.0–8.0)

## 2014-05-22 LAB — WET PREP, GENITAL
Clue Cells Wet Prep HPF POC: NONE SEEN
Trich, Wet Prep: NONE SEEN
Yeast Wet Prep HPF POC: NONE SEEN

## 2014-05-22 LAB — URINE MICROSCOPIC-ADD ON

## 2014-05-22 NOTE — MAU Provider Note (Signed)
History     CSN: [id number]  Arrival date and time: 05/22/14 [id number]   First Provider Initiated Contact with Patient 05/22/14 1006      Chief Complaint  Patient presents with  . Abdominal Pain   HPI 24 y.o. [id number] at 7268w6d w/ spotting starting 2 days ago, no bleeding at all today. Does report vaginal discharge w/ odor and some mild, intermittent low abd cramping. 2 prior term deliveries, this pregnancy normal so far.   Past Medical History  Diagnosis Date  . Depression     hospitalized for 2 weeks after suicide att  . Hx of gonorrhea   . Hypertension     gestational HTN    Past Surgical History  Procedure Laterality Date  . No past surgeries      Family History  Problem Relation Age of Onset  . Hypertension Mother   . Hypertension Maternal Aunt   . Hypertension Maternal Grandmother   . Depression Cousin     History  Substance Use Topics  . Smoking status: Never Smoker   . Smokeless tobacco: Never Used  . Alcohol Use: No    Allergies: No Known Allergies  Prescriptions prior to admission  Medication Sig Dispense Refill Last Dose  . azithromycin (ZITHROMAX) 250 MG tablet Take 4 tablets (1,000 mg total) by mouth once. (Patient not taking: Reported on 05/22/2014) 4 tablet 0   . sulfamethoxazole-trimethoprim (BACTRIM DS,SEPTRA DS) 800-160 MG per tablet Take 1 tablet by mouth 2 (two) times daily. (Patient not taking: Reported on 05/22/2014) 14 tablet 1   . tinidazole (TINDAMAX) 500 MG tablet Take 2 tablets (1,000 mg total) by mouth daily with breakfast. (Patient not taking: Reported on 05/22/2014) 10 tablet 2     Review of Systems  Constitutional: Negative.   Respiratory: Negative.   Cardiovascular: Negative.   Gastrointestinal: Negative for nausea, vomiting, abdominal pain, diarrhea and constipation.  Genitourinary: Negative for dysuria, urgency, frequency, hematuria and flank pain.       + spotting & discharge  Musculoskeletal: Negative.   Neurological: Negative.    Psychiatric/Behavioral: Negative.    Physical Exam   Blood pressure 123/67, pulse 84, temperature 98.6 F (37 C), temperature source Oral, resp. rate 18, last menstrual period 12/31/2013, not currently breastfeeding.  Physical Exam  Nursing note and vitals reviewed. Constitutional: She is oriented to person, place, and time. She appears well-developed and well-nourished. No distress.  HENT:  Head: Normocephalic and atraumatic.  Cardiovascular: Normal rate.   Respiratory: Effort normal.  GI: Soft. Bowel sounds are normal. She exhibits no mass. There is no tenderness. There is no rebound and no guarding.  Genitourinary: There is no rash or lesion on the right labia. There is no rash or lesion on the left labia. Uterus is not tender. Enlarged: Size c/w dates. Cervix exhibits no motion tenderness, no discharge and no friability. Right adnexum displays no mass, no tenderness and no fullness. Left adnexum displays no mass, no tenderness and no fullness. No tenderness or bleeding in the vagina. Vaginal discharge (thick, white) found.  Cervix closed   Musculoskeletal: Normal range of motion.  Neurological: She is alert and oriented to person, place, and time.  Skin: Skin is warm and dry.  Psychiatric: She has a normal mood and affect.    +FHR MAU Course  Procedures Results for orders placed or performed during the hospital encounter of 05/22/14 (from the past 24 hour(s))  Urinalysis, Routine w reflex microscopic     Status: Abnormal   Collection  Time: 05/22/14  9:34 AM  Result Value Ref Range   Color, Urine YELLOW YELLOW   APPearance CLEAR CLEAR   Specific Gravity, Urine 1.025 1.005 - 1.030   pH 6.5 5.0 - 8.0   Glucose, UA NEGATIVE NEGATIVE mg/dL   Hgb urine dipstick TRACE (A) NEGATIVE   Bilirubin Urine NEGATIVE NEGATIVE   Ketones, ur NEGATIVE NEGATIVE mg/dL   Protein, ur NEGATIVE NEGATIVE mg/dL   Urobilinogen, UA 0.2 0.0 - 1.0 mg/dL   Nitrite NEGATIVE NEGATIVE   Leukocytes, UA  NEGATIVE NEGATIVE  Urine microscopic-add on     Status: None   Collection Time: 05/22/14  9:34 AM  Result Value Ref Range   Squamous Epithelial / LPF RARE RARE   RBC / HPF 0-2 <3 RBC/hpf  Wet prep, genital     Status: Abnormal   Collection Time: 05/22/14 10:05 AM  Result Value Ref Range   Yeast Wet Prep HPF POC NONE SEEN NONE SEEN   Trich, Wet Prep NONE SEEN NONE SEEN   Clue Cells Wet Prep HPF POC NONE SEEN NONE SEEN   WBC, Wet Prep HPF POC FEW (A) NONE SEEN   .Blood type A positive Pt requested note for work for yesterday and today/ will be given for today only  Assessment and Plan  24 y.o. [id number] at [redacted]w[redacted]d w/ spotting x 2 days, now resolved and vaginal d/c Wet prep pending Care assumed by Jannifer Rodney, NP  A: Vaginal spotting in pregnancy  P: Await remainder of labs/ will be called with positives Note for work today Has appointment with Dr Clearance Coots June 04, 2014 May return to MAU as needed  Carolynn Serve 05/22/2014, 11:16 AM

## 2014-05-22 NOTE — Discharge Instructions (Signed)
Vaginal Bleeding During Pregnancy, Second Trimester °A small amount of bleeding (spotting) from the vagina is relatively common in pregnancy. It usually stops on its own. Various things can cause bleeding or spotting in pregnancy. Some bleeding may be related to the pregnancy, and some may not. Sometimes the bleeding is normal and is not a problem. However, bleeding can also be a sign of something serious. Be sure to tell your health care provider about any vaginal bleeding right away. °Some possible causes of vaginal bleeding during the second trimester include: °· Infection, inflammation, or growths on the cervix.   °· The placenta may be partially or completely covering the opening of the cervix inside the uterus (placenta previa). °· The placenta may have separated from the uterus (abruption of the placenta).   °· You may be having early (preterm) labor.   °· The cervix may not be strong enough to keep a baby inside the uterus (cervical insufficiency).   °· Tiny cysts may have developed in the uterus instead of pregnancy tissue (molar pregnancy).  °HOME CARE INSTRUCTIONS  °Watch your condition for any changes. The following actions may help to lessen any discomfort you are feeling: °· Follow your health care provider's instructions for limiting your activity. If your health care provider orders bed rest, you may need to stay in bed and only get up to use the bathroom. However, your health care provider may allow you to continue light activity. °· If needed, make plans for someone to help with your regular activities and responsibilities while you are on bed rest. °· Keep track of the number of pads you use each day, how often you change pads, and how soaked (saturated) they are. Write this down. °· Do not use tampons. Do not douche. °· Do not have sexual intercourse or orgasms until approved by your health care provider. °· If you pass any tissue from your vagina, save the tissue so you can show it to your  health care provider. °· Only take over-the-counter or prescription medicines as directed by your health care provider. °· Do not take aspirin because it can make you bleed. °· Do not exercise or perform any strenuous activities or heavy lifting without your health care provider's permission. °· Keep all follow-up appointments as directed by your health care provider. °SEEK MEDICAL CARE IF: °· You have any vaginal bleeding during any part of your pregnancy. °· You have cramps or labor pains. °· You have a fever, not controlled by medicine. °SEEK IMMEDIATE MEDICAL CARE IF:  °· You have severe cramps in your back or belly (abdomen). °· You have contractions. °· You have chills. °· You pass large clots or tissue from your vagina. °· Your bleeding increases. °· You feel light-headed or weak, or you have fainting episodes. °· You are leaking fluid or have a gush of fluid from your vagina. °MAKE SURE YOU: °· Understand these instructions. °· Will watch your condition. °· Will get help right away if you are not doing well or get worse. °Document Released: 12/15/2004 Document Revised: 03/12/2013 Document Reviewed: 11/12/2012 °ExitCare® Patient Information ©2015 [employer]. This information is not intended to replace advice given to you by your health care provider. Make sure you discuss any questions you have with your health care provider. ° °

## 2014-05-22 NOTE — MAU Note (Signed)
Cramping & spotting since 3/1, but hasn't had spotting today.  Denies vomiting or diarrhea.

## 2014-05-22 NOTE — MAU Note (Signed)
Urine in lab 

## 2014-05-23 ENCOUNTER — Other Ambulatory Visit: Payer: Self-pay | Admitting: Obstetrics

## 2014-05-23 DIAGNOSIS — Z1389 Encounter for screening for other disorder: Secondary | ICD-10-CM

## 2014-05-23 LAB — GC/CHLAMYDIA PROBE AMP (~~LOC~~) NOT AT ARMC
Chlamydia: NEGATIVE
Neisseria Gonorrhea: NEGATIVE

## 2014-06-03 ENCOUNTER — Telehealth: Payer: Self-pay

## 2014-06-03 NOTE — Telephone Encounter (Signed)
Have been calling patient since Thursday, March 10th that her medicaid is inactive - she states she talked to caseworker, but still inactive as of today - moved ultrasound to 3/23 for now

## 2014-06-04 ENCOUNTER — Encounter: Payer: Medicaid Other | Admitting: Obstetrics

## 2014-06-04 ENCOUNTER — Other Ambulatory Visit: Payer: Medicaid Other

## 2014-06-10 ENCOUNTER — Telehealth: Payer: Self-pay

## 2014-06-10 NOTE — Telephone Encounter (Signed)
medicaid still not active in [employer] or [place] [employer], left message with patient that we had to move her appt to 06/18/14

## 2014-06-10 NOTE — Telephone Encounter (Signed)
[facility] sch patient as they will backbill medicaid since her's is not active at this time [facility] will not accept - called patient and left msg for appt date and time at [facility] 06/16/14 at 1:30pm

## 2014-06-11 ENCOUNTER — Other Ambulatory Visit: Payer: Medicaid Other

## 2014-06-11 ENCOUNTER — Encounter: Payer: Medicaid Other | Admitting: Obstetrics

## 2014-06-16 ENCOUNTER — Ambulatory Visit (HOSPITAL_COMMUNITY)
Admission: RE | Admit: 2014-06-16 | Discharge: 2014-06-16 | Disposition: A | Discharge status: Home / Self Care | Payer: Medicaid Other | Source: Ambulatory Visit | Attending: Obstetrics | Admitting: Obstetrics

## 2014-06-16 DIAGNOSIS — Z1389 Encounter for screening for other disorder: Secondary | ICD-10-CM

## 2014-06-16 DIAGNOSIS — Z36 Encounter for antenatal screening of mother: Secondary | ICD-10-CM | POA: Insufficient documentation

## 2014-06-17 DIAGNOSIS — Z3A19 19 weeks gestation of pregnancy: Secondary | ICD-10-CM | POA: Insufficient documentation

## 2014-06-17 DIAGNOSIS — Z1389 Encounter for screening for other disorder: Secondary | ICD-10-CM | POA: Insufficient documentation

## 2014-06-18 ENCOUNTER — Encounter: Payer: Medicaid Other | Admitting: Obstetrics

## 2014-06-18 ENCOUNTER — Other Ambulatory Visit: Payer: Medicaid Other

## 2014-06-20 ENCOUNTER — Encounter: Payer: Medicaid Other | Admitting: Obstetrics

## 2014-07-19 ENCOUNTER — Encounter (HOSPITAL_COMMUNITY): Payer: Self-pay

## 2014-07-19 ENCOUNTER — Other Ambulatory Visit: Payer: Self-pay | Admitting: Obstetrics

## 2014-07-19 ENCOUNTER — Inpatient Hospital Stay (HOSPITAL_COMMUNITY)
Admission: AD | Admit: 2014-07-19 | Discharge: 2014-07-19 | Disposition: A | Discharge status: Home / Self Care | Payer: Medicaid Other | Source: Ambulatory Visit | Attending: Obstetrics | Admitting: Obstetrics

## 2014-07-19 DIAGNOSIS — O91112 Abscess of breast associated with pregnancy, second trimester: Secondary | ICD-10-CM | POA: Insufficient documentation

## 2014-07-19 DIAGNOSIS — B3731 Acute candidiasis of vulva and vagina: Secondary | ICD-10-CM

## 2014-07-19 DIAGNOSIS — B373 Candidiasis of vulva and vagina: Secondary | ICD-10-CM | POA: Insufficient documentation

## 2014-07-19 DIAGNOSIS — N390 Urinary tract infection, site not specified: Secondary | ICD-10-CM

## 2014-07-19 DIAGNOSIS — O98812 Other maternal infectious and parasitic diseases complicating pregnancy, second trimester: Secondary | ICD-10-CM | POA: Insufficient documentation

## 2014-07-19 DIAGNOSIS — O2342 Unspecified infection of urinary tract in pregnancy, second trimester: Secondary | ICD-10-CM

## 2014-07-19 DIAGNOSIS — O91119 Abscess of breast associated with pregnancy, unspecified trimester: Secondary | ICD-10-CM

## 2014-07-19 DIAGNOSIS — Z3A26 26 weeks gestation of pregnancy: Secondary | ICD-10-CM | POA: Insufficient documentation

## 2014-07-19 HISTORY — DX: Renal tubulo-interstitial disease, unspecified: N15.9

## 2014-07-19 LAB — WET PREP, GENITAL
CLUE CELLS WET PREP: NONE SEEN
Trich, Wet Prep: NONE SEEN

## 2014-07-19 LAB — URINALYSIS, ROUTINE W REFLEX MICROSCOPIC
Bilirubin Urine: NEGATIVE
Glucose, UA: NEGATIVE mg/dL
Hgb urine dipstick: NEGATIVE
Ketones, ur: 15 mg/dL — AB
Nitrite: POSITIVE — AB
PH: 6.5 (ref 5.0–8.0)
Protein, ur: NEGATIVE mg/dL
Specific Gravity, Urine: 1.025 (ref 1.005–1.030)
UROBILINOGEN UA: 1 mg/dL (ref 0.0–1.0)

## 2014-07-19 LAB — CBC
HEMATOCRIT: 31.3 % — AB (ref 36.0–46.0)
Hemoglobin: 10.4 g/dL — ABNORMAL LOW (ref 12.0–15.0)
MCH: 27.9 pg (ref 26.0–34.0)
MCHC: 33.2 g/dL (ref 30.0–36.0)
MCV: 83.9 fL (ref 78.0–100.0)
Platelets: 275 10*3/uL (ref 150–400)
RBC: 3.73 MIL/uL — AB (ref 3.87–5.11)
RDW: 13.6 % (ref 11.5–15.5)
WBC: 11.6 10*3/uL — ABNORMAL HIGH (ref 4.0–10.5)

## 2014-07-19 LAB — URINE MICROSCOPIC-ADD ON

## 2014-07-19 LAB — OB RESULTS CONSOLE GC/CHLAMYDIA: GC PROBE AMP, GENITAL: NEGATIVE

## 2014-07-19 MED ORDER — AMOXICILLIN-POT CLAVULANATE 875-125 MG PO TABS: 1.0000 | ORAL_TABLET | Freq: Two times a day (BID) | ORAL | Status: DC

## 2014-07-19 MED ORDER — CEFTRIAXONE SODIUM 250 MG IJ SOLR
250.0000 mg | Freq: Once | INTRAMUSCULAR | Status: AC
  Administered 2014-07-19: 250 mg via INTRAMUSCULAR
  Filled 2014-07-19: qty 250

## 2014-07-19 MED ORDER — FLUCONAZOLE 150 MG PO TABS: 150.0000 mg | ORAL_TABLET | Freq: Once | ORAL | Status: DC

## 2014-07-19 MED ORDER — ACETAMINOPHEN 500 MG PO TABS
1000.0000 mg | ORAL_TABLET | Freq: Once | ORAL | Status: AC
  Administered 2014-07-19: 1000 mg via ORAL
  Filled 2014-07-19: qty 2

## 2014-07-19 NOTE — MAU Note (Signed)
Pt states here for burning, frequency, urgency with voiding x1 week. Also here for lump on r breast noted x2 days. Lump is painful to touch however no redness is noted. Also has vaginal discharge that is white and odorous.

## 2014-07-19 NOTE — MAU Note (Signed)
No adverse reaction 

## 2014-07-19 NOTE — MAU Provider Note (Signed)
History     CSN: [id number]  Arrival date and time: 07/19/14 1139   First Provider Initiated Contact with Patient 07/19/14 1218      Chief Complaint  Patient presents with  . Urinary Tract Infection   HPI 24 y.o. [id number] at 3247w1d w/ multiple complaints.  1. Urinary symptoms x 1 week - dysuria, frequency, urgency, no back or flank pain 2. Vaginal discharge x 1 week - white, + odor, + itching, h/o treatment for +GC in this pregnancy 3. R breast "lump" x 2 days - painful, especially tender to touch, redness  Past Medical History  Diagnosis Date  . Depression     hospitalized for 2 weeks after suicide att  . Hx of gonorrhea   . Hypertension     gestational HTN  . Kidney infection     Past Surgical History  Procedure Laterality Date  . No past surgeries      Family History  Problem Relation Age of Onset  . Hypertension Mother   . Hypertension Maternal Aunt   . Hypertension Maternal Grandmother   . Depression Cousin     History  Substance Use Topics  . Smoking status: Never Smoker   . Smokeless tobacco: Never Used  . Alcohol Use: No    Allergies: No Known Allergies  No prescriptions prior to admission    Review of Systems  Constitutional: Positive for malaise/fatigue.  Respiratory: Negative.   Cardiovascular: Negative.   Gastrointestinal: Negative for nausea, vomiting, abdominal pain, diarrhea and constipation.  Genitourinary: Positive for dysuria, urgency and frequency. Negative for hematuria and flank pain.       Negative for vaginal bleeding, cramping/contractions, + discharge   Musculoskeletal: Negative.   Neurological: Negative.   Psychiatric/Behavioral: Negative.    Physical Exam   Blood pressure 123/75, pulse 99, temperature 99 F (37.2 C), temperature source Oral, resp. rate 16, height 5\' 4"  (1.626 m), weight 136 lb (61.689 kg), last menstrual period 12/31/2013, not currently breastfeeding.  Physical Exam  Nursing note and vitals  reviewed. Constitutional: She is oriented to person, place, and time. She appears well-developed and well-nourished. No distress.  HENT:  Head: Normocephalic and atraumatic.  Cardiovascular: Normal rate.   Respiratory: Effort normal.    GI: Soft. Bowel sounds are normal. She exhibits no mass. There is no tenderness. There is no rebound and no guarding.  Genitourinary: There is no rash or lesion on the right labia. There is no rash or lesion on the left labia. Uterus is not tender. Enlarged: Size c/w dates. Cervix exhibits no motion tenderness, no discharge and no friability. Right adnexum displays no mass, no tenderness and no fullness. Left adnexum displays no mass, no tenderness and no fullness. No tenderness or bleeding in the vagina. Vaginal discharge (white, + odor) found.  Cervix multiparous, internal os closed, thick, high   Musculoskeletal: Normal range of motion.  Neurological: She is alert and oriented to person, place, and time.  Skin: Skin is warm and dry.  Psychiatric: She has a normal mood and affect.   EFM reassuring at 26 weeks, TOCO quiet MAU Course  Procedures Results for orders placed or performed during the hospital encounter of 07/19/14 (from the past 24 hour(s))  Urinalysis, Routine w reflex microscopic     Status: Abnormal   Collection Time: 07/19/14 12:00 PM  Result Value Ref Range   Color, Urine YELLOW YELLOW   APPearance CLEAR CLEAR   Specific Gravity, Urine 1.025 1.005 - 1.030   pH 6.5 5.0 -  8.0   Glucose, UA NEGATIVE NEGATIVE mg/dL   Hgb urine dipstick NEGATIVE NEGATIVE   Bilirubin Urine NEGATIVE NEGATIVE   Ketones, ur 15 (A) NEGATIVE mg/dL   Protein, ur NEGATIVE NEGATIVE mg/dL   Urobilinogen, UA 1.0 0.0 - 1.0 mg/dL   Nitrite POSITIVE (A) NEGATIVE   Leukocytes, UA SMALL (A) NEGATIVE  Urine microscopic-add on     Status: Abnormal   Collection Time: 07/19/14 12:00 PM  Result Value Ref Range   Squamous Epithelial / LPF MANY (A) RARE   WBC, UA 7-10 <3  WBC/hpf   RBC / HPF 0-2 <3 RBC/hpf   Bacteria, UA MANY (A) RARE   Urine-Other MUCOUS PRESENT   Wet prep, genital     Status: Abnormal   Collection Time: 07/19/14 12:25 PM  Result Value Ref Range   Yeast Wet Prep HPF POC FEW (A) NONE SEEN   Trich, Wet Prep NONE SEEN NONE SEEN   Clue Cells Wet Prep HPF POC NONE SEEN NONE SEEN   WBC, Wet Prep HPF POC MODERATE (A) NONE SEEN  CBC     Status: Abnormal   Collection Time: 07/19/14 12:35 PM  Result Value Ref Range   WBC 11.6 (H) 4.0 - 10.5 K/uL   RBC 3.73 (L) 3.87 - 5.11 MIL/uL   Hemoglobin 10.4 (L) 12.0 - 15.0 g/dL   HCT 91.4 (L) 78.2 - 95.6 %   MCV 83.9 78.0 - 100.0 fL   MCH 27.9 26.0 - 34.0 pg   MCHC 33.2 30.0 - 36.0 g/dL   RDW 21.3 08.6 - 57.8 %   Platelets 275 150 - 400 K/uL     Assessment and Plan   24 y.o. [id number] at [redacted]w[redacted]d with: 1. R breast abscess - rev'd w/ Dr. Clearance Coots, recommended Rocephin 250 mg IM now, d/c home w/ rx for Augmentin BID, [facility] - [facility] first thing Monday morning, f/u w/ Dr. Clearance Coots early next week, return with worsening symptoms 2. UTI - should be covered by rocephin/augmentin - rev'd pyelo precautions - urine culture pending 3. Yeast - treat w/ diflucan    Medication List    TAKE these medications        amoxicillin-clavulanate 875-125 MG per tablet  Commonly known as:  AUGMENTIN  Take 1 tablet by mouth 2 (two) times daily.     fluconazole 150 MG tablet  Commonly known as:  DIFLUCAN  Take 1 tablet (150 mg total) by mouth once. Repeat in 3 days.            Follow-up Information    Follow up with [facility].   Specialty:  Diagnostic Radiology   Why:  call Monday morning to schedule appointment   Contact information:   [address] [place] [address] [phone]       Follow up with HARPER,CHARLES A, MD.   Specialty:  Obstetrics and Gynecology   Why:  follow up in office next  week, sooner if no improvement   Contact information:   [address] [place] [address] [phone]         [facility] 07/19/2014, 2:43 PM

## 2014-07-21 ENCOUNTER — Other Ambulatory Visit: Payer: Self-pay | Admitting: Obstetrics

## 2014-07-21 ENCOUNTER — Telehealth: Payer: Self-pay

## 2014-07-21 ENCOUNTER — Other Ambulatory Visit (HOSPITAL_COMMUNITY): Payer: Self-pay | Admitting: Advanced Practice Midwife

## 2014-07-21 DIAGNOSIS — O91119 Abscess of breast associated with pregnancy, unspecified trimester: Secondary | ICD-10-CM

## 2014-07-21 LAB — CULTURE, OB URINE
Colony Count: >=100000
Special Requests: NORMAL

## 2014-07-21 LAB — GC/CHLAMYDIA PROBE AMP (~~LOC~~) NOT AT ARMC
Chlamydia: NEGATIVE
NEISSERIA GONORRHEA: NEGATIVE

## 2014-07-21 NOTE — Telephone Encounter (Signed)
Patient's medicaid is not active - called her to let her know - had order in for [facility] and they could not sch it

## 2014-07-22 ENCOUNTER — Other Ambulatory Visit: Payer: Self-pay | Admitting: Certified Nurse Midwife

## 2014-07-22 DIAGNOSIS — N3000 Acute cystitis without hematuria: Secondary | ICD-10-CM

## 2014-07-22 MED ORDER — SULFAMETHOXAZOLE-TRIMETHOPRIM 800-160 MG PO TABS: 1.0000 | ORAL_TABLET | Freq: Two times a day (BID) | ORAL | Status: DC

## 2014-08-20 ENCOUNTER — Encounter (HOSPITAL_COMMUNITY): Payer: Self-pay | Admitting: *Deleted

## 2014-08-20 ENCOUNTER — Inpatient Hospital Stay (HOSPITAL_COMMUNITY)
Admission: AD | Admit: 2014-08-20 | Discharge: 2014-08-20 | Disposition: A | Discharge status: Home / Self Care | Payer: Self-pay | Source: Ambulatory Visit | Attending: Family Medicine | Admitting: Family Medicine

## 2014-08-20 DIAGNOSIS — O26893 Other specified pregnancy related conditions, third trimester: Secondary | ICD-10-CM

## 2014-08-20 DIAGNOSIS — O0933 Supervision of pregnancy with insufficient antenatal care, third trimester: Secondary | ICD-10-CM | POA: Insufficient documentation

## 2014-08-20 DIAGNOSIS — N898 Other specified noninflammatory disorders of vagina: Secondary | ICD-10-CM

## 2014-08-20 DIAGNOSIS — Z3A30 30 weeks gestation of pregnancy: Secondary | ICD-10-CM | POA: Insufficient documentation

## 2014-08-20 DIAGNOSIS — O2343 Unspecified infection of urinary tract in pregnancy, third trimester: Secondary | ICD-10-CM | POA: Insufficient documentation

## 2014-08-20 LAB — URINE MICROSCOPIC-ADD ON

## 2014-08-20 LAB — URINALYSIS, ROUTINE W REFLEX MICROSCOPIC
Bilirubin Urine: NEGATIVE
Glucose, UA: NEGATIVE mg/dL
Ketones, ur: NEGATIVE mg/dL
NITRITE: POSITIVE — AB
PROTEIN: NEGATIVE mg/dL
Specific Gravity, Urine: 1.025 (ref 1.005–1.030)
Urobilinogen, UA: 0.2 mg/dL (ref 0.0–1.0)
pH: 7 (ref 5.0–8.0)

## 2014-08-20 LAB — AMNISURE RUPTURE OF MEMBRANE (ROM) NOT AT ARMC: Amnisure ROM: NEGATIVE

## 2014-08-20 MED ORDER — CEPHALEXIN 500 MG PO CAPS: 500.0000 mg | ORAL_CAPSULE | Freq: Four times a day (QID) | ORAL | Status: DC

## 2014-08-20 MED ORDER — ACETAMINOPHEN 500 MG PO TABS
1000.0000 mg | ORAL_TABLET | Freq: Once | ORAL | Status: AC
  Administered 2014-08-20: 1000 mg via ORAL
  Filled 2014-08-20: qty 2

## 2014-08-20 NOTE — Discharge Instructions (Signed)
For assistance with applying for medicaid please call Reyna [facility] [phone] or Juanita McCraw [phone].  

## 2014-08-20 NOTE — MAU Note (Signed)
Pt presents stating she is having a thick white creamy discharge that is running down her legs this am. Has since resolved. Thinks her water broke. Reports some contractions. Denies bleeding. Reports good fetal movement.

## 2014-08-20 NOTE — MAU Provider Note (Signed)
History     CSN: [id number]  Arrival date and time: 08/20/14 [id number]   First Provider Initiated Contact with Patient 08/20/14 [phone]      Chief Complaint  Patient presents with  . Vaginal Discharge   HPI   [name] is a 24 y.o. female [id number] at 5368w5d who presents with concerns regarding possible ROM. At 0730 this morning she felt a gush of white discharge which trickled down her leg. She was standing up doing her hair. She has not had intercourse recently. It has continued to leak a little since her arrival here. She has not had to wear a pad into the hospital.   She has no prenatal care; she inititally started with Dr. Clearance CootsHarper and had a visit in January, however has not been back since due to insurance. She has had an anatomy scan done through Dr. Clearance CootsHarper.   OB History    Gravida Para Term Preterm AB TAB SAB Ectopic Multiple Living   4 2 2  0 1 0 1 0 0 2      Past Medical History  Diagnosis Date  . Depression     hospitalized for 2 weeks after suicide att  . Hx of gonorrhea   . Hypertension     gestational HTN  . Kidney infection     Past Surgical History  Procedure Laterality Date  . No past surgeries      Family History  Problem Relation Age of Onset  . Hypertension Mother   . Hypertension Maternal Aunt   . Hypertension Maternal Grandmother   . Depression Cousin     History  Substance Use Topics  . Smoking status: Never Smoker   . Smokeless tobacco: Never Used  . Alcohol Use: No    Allergies: No Known Allergies  Prescriptions prior to admission  Medication Sig Dispense Refill Last Dose  . amoxicillin-clavulanate (AUGMENTIN) 875-125 MG per tablet Take 1 tablet by mouth 2 (two) times daily. (Patient not taking: Reported on 08/20/2014) 20 tablet 0 Completed Course at Unknown time  . fluconazole (DIFLUCAN) 150 MG tablet Take 1 tablet (150 mg total) by mouth once. Repeat in 3 days. (Patient not taking: Reported on 08/20/2014) 2 tablet 0 Completed Course at  Unknown time  . sulfamethoxazole-trimethoprim (BACTRIM DS,SEPTRA DS) 800-160 MG per tablet Take 1 tablet by mouth 2 (two) times daily. (Patient not taking: Reported on 08/20/2014) 14 tablet 0 Completed Course at Unknown time   Results for orders placed or performed during the hospital encounter of 08/20/14 (from the past 48 hour(s))  Urinalysis, Routine w reflex microscopic ([facility])     Status: Abnormal   Collection Time: 08/20/14  8:07 AM  Result Value Ref Range   Color, Urine YELLOW YELLOW   APPearance HAZY (A) CLEAR   Specific Gravity, Urine 1.025 1.005 - 1.030   pH 7.0 5.0 - 8.0   Glucose, UA NEGATIVE NEGATIVE mg/dL   Hgb urine dipstick TRACE (A) NEGATIVE   Bilirubin Urine NEGATIVE NEGATIVE   Ketones, ur NEGATIVE NEGATIVE mg/dL   Protein, ur NEGATIVE NEGATIVE mg/dL   Urobilinogen, UA 0.2 0.0 - 1.0 mg/dL   Nitrite POSITIVE (A) NEGATIVE   Leukocytes, UA MODERATE (A) NEGATIVE  Urine microscopic-add on     Status: Abnormal   Collection Time: 08/20/14  8:07 AM  Result Value Ref Range   Squamous Epithelial / LPF MANY (A) RARE   WBC, UA 7-10 <3 WBC/hpf   Bacteria, UA FEW (A) RARE  Amnisure  rupture of membrane (rom)not at [facility]     Status: None   Collection Time: 08/20/14  9:15 AM  Result Value Ref Range   Amnisure ROM NEGATIVE     Review of Systems  Constitutional: Negative for fever.  Gastrointestinal: Positive for abdominal pain (Mild; lower abdominal cramps ). Negative for nausea and vomiting.  Genitourinary:       Denies vaginal bleeding    Physical Exam   Blood pressure 130/79, pulse 67, temperature 98 F (36.7 C), temperature source Oral, resp. rate 18, height  (1.6 m), weight 64.229 kg (141 lb 9.6 oz), last menstrual period 12/31/2013, not currently breastfeeding.  Physical Exam  Constitutional: She is oriented to person, place, and time. She appears well-developed and well-nourished. No distress.  HENT:  Head: Normocephalic.  Eyes: Pupils are equal, round,  and reactive to light.  Neck: Neck supple.  Respiratory: Effort normal.  GI: Soft.  Genitourinary:  Speculum exam: Vagina - Small amount of creamy discharge, no odor. No pooling of fluid  Cervix - No contact bleeding Bimanual exam: Dilation:  (External os 1.5 internal os closed) Effacement (%): Thick Cervical Position: Posterior Station:  (High) Exam by:: Dorrene German RN   Musculoskeletal: Normal range of motion.  Neurological: She is alert and oriented to person, place, and time.  Skin: Skin is warm. She is not diaphoretic.  Psychiatric: Her behavior is normal.   Fetal Tracing: Baseline: 125 bpm Variability: moderate  Accelerations: 15x15 Decelerations: none Toco: rare UI    MAU Course  Procedures  MDM Urine culture pending  Fetal tracing reviewed with Dr. Shawnie Pons.   PO hydration encouraged  Tylenol 1 gram given   Assessment and Plan   A:  1. Vaginal discharge in pregnancy in third trimester   2. No prenatal care in current pregnancy, third trimester   3. UTI (urinary tract infection) during pregnancy, third trimester     P:  Discharge home in stable condition Patient given the number for medicaid assistance and encouraged to call today. The patient was encouraged to call Dr. Thomes Lolling office once [facility] was approved, and with any problems she could call the [facility].  RX: Keflex  Return to MAU if symptoms worsen    Duane Lope, NP 08/20/2014 9:02 AM

## 2014-08-21 ENCOUNTER — Other Ambulatory Visit: Payer: Self-pay | Admitting: Obstetrics

## 2014-08-22 ENCOUNTER — Other Ambulatory Visit: Payer: Self-pay | Admitting: Obstetrics

## 2014-08-22 LAB — URINE CULTURE

## 2014-09-10 ENCOUNTER — Inpatient Hospital Stay (HOSPITAL_COMMUNITY)
Admission: AD | Admit: 2014-09-10 | Discharge: 2014-09-11 | Discharge status: Against Medical Advice | Payer: Medicaid Other | Source: Ambulatory Visit | Attending: Obstetrics and Gynecology | Admitting: Obstetrics and Gynecology

## 2014-09-10 ENCOUNTER — Encounter (HOSPITAL_COMMUNITY): Payer: Self-pay | Admitting: *Deleted

## 2014-09-10 DIAGNOSIS — Z3493 Encounter for supervision of normal pregnancy, unspecified, third trimester: Secondary | ICD-10-CM | POA: Diagnosis not present

## 2014-09-10 DIAGNOSIS — Z5320 Procedure and treatment not carried out because of patient's decision for unspecified reasons: Secondary | ICD-10-CM | POA: Diagnosis not present

## 2014-09-10 NOTE — MAU Note (Signed)
Pt states that both children were fullterm. Pt has not been seen at Dr. Verdell Carmine since 3 month visit. Pt states that she is not allowed to be seen there and will become faculty patient.

## 2014-09-10 NOTE — MAU Note (Signed)
Pincus Badder given report and to evaluate and assess patient in room.

## 2014-09-10 NOTE — MAU Note (Signed)
Lower abdominal pain since this morning; constant; pressure; worse with standing. Denies vaginal bleeding or LOF. Positive fetal movement. Thinks she lost her mucous plug a few days ago.

## 2014-09-11 NOTE — MAU Note (Signed)
Pincus Badder CNM aware that patient has left AMA

## 2014-09-15 ENCOUNTER — Inpatient Hospital Stay (HOSPITAL_COMMUNITY)
Admission: EM | Admit: 2014-09-15 | Discharge: 2014-09-16 | Disposition: A | Discharge status: Home / Self Care | Payer: Medicaid Other | Attending: Obstetrics & Gynecology | Admitting: Obstetrics & Gynecology

## 2014-09-15 ENCOUNTER — Emergency Department (HOSPITAL_COMMUNITY): Payer: Medicaid Other

## 2014-09-15 ENCOUNTER — Encounter (HOSPITAL_COMMUNITY): Payer: Self-pay | Admitting: Nurse Practitioner

## 2014-09-15 DIAGNOSIS — R109 Unspecified abdominal pain: Secondary | ICD-10-CM | POA: Diagnosis present

## 2014-09-15 DIAGNOSIS — O9989 Other specified diseases and conditions complicating pregnancy, childbirth and the puerperium: Secondary | ICD-10-CM | POA: Diagnosis not present

## 2014-09-15 DIAGNOSIS — S6992XA Unspecified injury of left wrist, hand and finger(s), initial encounter: Secondary | ICD-10-CM

## 2014-09-15 DIAGNOSIS — W19XXXA Unspecified fall, initial encounter: Secondary | ICD-10-CM

## 2014-09-15 DIAGNOSIS — Z3A34 34 weeks gestation of pregnancy: Secondary | ICD-10-CM | POA: Diagnosis not present

## 2014-09-15 DIAGNOSIS — R030 Elevated blood-pressure reading, without diagnosis of hypertension: Secondary | ICD-10-CM | POA: Diagnosis not present

## 2014-09-15 DIAGNOSIS — O133 Gestational [pregnancy-induced] hypertension without significant proteinuria, third trimester: Secondary | ICD-10-CM

## 2014-09-15 DIAGNOSIS — Z3490 Encounter for supervision of normal pregnancy, unspecified, unspecified trimester: Secondary | ICD-10-CM

## 2014-09-15 NOTE — ED Notes (Signed)
MD at bedside. 
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
## 2014-09-15 NOTE — ED Notes (Addendum)
Dispatch for [facility] called to transfer. Dispatch no. [phone]. Unable to give ETA.  Will be here ASAP.

## 2014-09-15 NOTE — ED Notes (Signed)
Bed: [id number] Expected date:  Expected time:  Means of arrival:  Comments: Pt is still in the room

## 2014-09-15 NOTE — ED Notes (Signed)
Rapid Response nurse called.
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
## 2014-09-15 NOTE — ED Notes (Signed)
Patient transported to X-ray 

## 2014-09-15 NOTE — ED Notes (Addendum)
Rapid Response RN at bedside, monitor in room.

## 2014-09-16 ENCOUNTER — Encounter (HOSPITAL_COMMUNITY): Payer: Self-pay

## 2014-09-16 LAB — COMPREHENSIVE METABOLIC PANEL WITH GFR
ALT: 9 U/L — ABNORMAL LOW (ref 14–54)
AST: 17 U/L (ref 15–41)
Albumin: 2.7 g/dL — ABNORMAL LOW (ref 3.5–5.0)
Alkaline Phosphatase: 82 U/L (ref 38–126)
Anion gap: 3 — ABNORMAL LOW (ref 5–15)
BUN: 10 mg/dL (ref 6–20)
CO2: 22 mmol/L (ref 22–32)
Calcium: 8.1 mg/dL — ABNORMAL LOW (ref 8.9–10.3)
Chloride: 111 mmol/L (ref 101–111)
Creatinine, Ser: 0.55 mg/dL (ref 0.44–1.00)
GFR calc Af Amer: >60 mL/min
GFR calc non Af Amer: >60 mL/min
Glucose, Bld: 101 mg/dL — ABNORMAL HIGH (ref 65–99)
Potassium: 3.3 mmol/L — ABNORMAL LOW (ref 3.5–5.1)
Sodium: 136 mmol/L (ref 135–145)
Total Bilirubin: 0.9 mg/dL (ref 0.3–1.2)
Total Protein: 6.3 g/dL — ABNORMAL LOW (ref 6.5–8.1)

## 2014-09-16 LAB — URINALYSIS, ROUTINE W REFLEX MICROSCOPIC
Bilirubin Urine: NEGATIVE
GLUCOSE, UA: NEGATIVE mg/dL
Hgb urine dipstick: NEGATIVE
Ketones, ur: NEGATIVE mg/dL
Leukocytes, UA: NEGATIVE
Nitrite: NEGATIVE
PROTEIN: NEGATIVE mg/dL
Specific Gravity, Urine: 1.025 (ref 1.005–1.030)
UROBILINOGEN UA: 1 mg/dL (ref 0.0–1.0)
pH: 5.5 (ref 5.0–8.0)

## 2014-09-16 LAB — CBC
HCT: 29.7 % — ABNORMAL LOW (ref 36.0–46.0)
Hemoglobin: 9.6 g/dL — ABNORMAL LOW (ref 12.0–15.0)
MCH: 26.8 pg (ref 26.0–34.0)
MCHC: 32.3 g/dL (ref 30.0–36.0)
MCV: 83 fL (ref 78.0–100.0)
Platelets: 300 K/uL (ref 150–400)
RBC: 3.58 MIL/uL — ABNORMAL LOW (ref 3.87–5.11)
RDW: 12.6 % (ref 11.5–15.5)
WBC: 5.7 K/uL (ref 4.0–10.5)

## 2014-09-16 LAB — PROTEIN / CREATININE RATIO, URINE
Creatinine, Urine: 203 mg/dL
Protein Creatinine Ratio: 0.14 mg/mg{creat} (ref 0.00–0.15)
Total Protein, Urine: 29 mg/dL

## 2014-09-16 MED ORDER — POTASSIUM CHLORIDE CRYS ER 20 MEQ PO TBCR
40.0000 meq | EXTENDED_RELEASE_TABLET | Freq: Once | ORAL | Status: AC
  Administered 2014-09-16: 40 meq via ORAL
  Filled 2014-09-16: qty 2

## 2014-09-16 MED ORDER — OXYCODONE-ACETAMINOPHEN 5-325 MG PO TABS
1.0000 | ORAL_TABLET | Freq: Once | ORAL | Status: AC
Start: 2014-09-16 — End: 2014-09-16
  Administered 2014-09-16: 1 via ORAL
  Filled 2014-09-16: qty 1

## 2014-09-16 MED ORDER — PRENATAL VITAMIN 27-0.8 MG PO TABS: 1.0000 | ORAL_TABLET | Freq: Every day | ORAL | Status: DC
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
## 2014-09-25 ENCOUNTER — Inpatient Hospital Stay (HOSPITAL_COMMUNITY)
Admission: AD | Admit: 2014-09-25 | Discharge: 2014-09-25 | Disposition: A | Discharge status: Home / Self Care | Payer: Medicaid Other | Source: Ambulatory Visit | Attending: Obstetrics & Gynecology | Admitting: Obstetrics & Gynecology

## 2014-09-25 ENCOUNTER — Encounter (HOSPITAL_COMMUNITY): Payer: Self-pay

## 2014-09-25 DIAGNOSIS — O2343 Unspecified infection of urinary tract in pregnancy, third trimester: Secondary | ICD-10-CM | POA: Insufficient documentation

## 2014-09-25 DIAGNOSIS — Z3A35 35 weeks gestation of pregnancy: Secondary | ICD-10-CM | POA: Insufficient documentation

## 2014-09-25 DIAGNOSIS — R109 Unspecified abdominal pain: Secondary | ICD-10-CM | POA: Diagnosis present

## 2014-09-25 LAB — URINE MICROSCOPIC-ADD ON

## 2014-09-25 LAB — URINALYSIS, ROUTINE W REFLEX MICROSCOPIC
Bilirubin Urine: NEGATIVE
Glucose, UA: NEGATIVE mg/dL
Ketones, ur: NEGATIVE mg/dL
Nitrite: POSITIVE — AB
PROTEIN: NEGATIVE mg/dL
Specific Gravity, Urine: >1.03 — ABNORMAL HIGH (ref 1.005–1.030)
UROBILINOGEN UA: 1 mg/dL (ref 0.0–1.0)
pH: 5.5 (ref 5.0–8.0)

## 2014-09-25 LAB — OB RESULTS CONSOLE GBS: GBS: POSITIVE

## 2014-09-25 LAB — RAPID URINE DRUG SCREEN, HOSP PERFORMED
Amphetamines: NOT DETECTED
Barbiturates: NOT DETECTED
Benzodiazepines: NOT DETECTED
Cocaine: NOT DETECTED
Opiates: NOT DETECTED
TETRAHYDROCANNABINOL: NOT DETECTED

## 2014-09-25 MED ORDER — CEPHALEXIN 500 MG PO CAPS: 500.0000 mg | ORAL_CAPSULE | Freq: Three times a day (TID) | ORAL | Status: DC

## 2014-09-25 NOTE — MAU Note (Signed)
Pt reports contractions, denies bleeding or ROM.  

## 2014-09-25 NOTE — Discharge Instructions (Signed)
Third Trimester of Pregnancy The third trimester is from week 29 through week 42, months 7 through 9. The third trimester is a time when the fetus is growing rapidly. At the end of the ninth month, the fetus is about 20 inches in length and weighs 6-10 pounds.  BODY CHANGES Your body goes through many changes during pregnancy. The changes vary from woman to woman.   Your weight will continue to increase. You can expect to gain 25-35 pounds (11-16 kg) by the end of the pregnancy.  You may begin to get stretch marks on your hips, abdomen, and breasts.  You may urinate more often because the fetus is moving lower into your pelvis and pressing on your bladder.  You may develop or continue to have heartburn as a result of your pregnancy.  You may develop constipation because certain hormones are causing the muscles that push waste through your intestines to slow down.  You may develop hemorrhoids or swollen, bulging veins (varicose veins).  You may have pelvic pain because of the weight gain and pregnancy hormones relaxing your joints between the bones in your pelvis. Backaches may result from overexertion of the muscles supporting your posture.  You may have changes in your hair. These can include thickening of your hair, rapid growth, and changes in texture. Some women also have hair loss during or after pregnancy, or hair that feels dry or thin. Your hair will most likely return to normal after your baby is born.  Your breasts will continue to grow and be tender. A yellow discharge may leak from your breasts called colostrum.  Your belly button may stick out.  You may feel short of breath because of your expanding uterus.  You may notice the fetus "dropping," or moving lower in your abdomen.  You may have a bloody mucus discharge. This usually occurs a few days to a week before labor begins.  Your cervix becomes thin and soft (effaced) near your due date. WHAT TO EXPECT AT YOUR PRENATAL  EXAMS  You will have prenatal exams every 2 weeks until week 36. Then, you will have weekly prenatal exams. During a routine prenatal visit:  You will be weighed to make sure you and the fetus are growing normally.  Your blood pressure is taken.  Your abdomen will be measured to track your baby's growth.  The fetal heartbeat will be listened to.  Any test results from the previous visit will be discussed.  You may have a cervical check near your due date to see if you have effaced. At around 36 weeks, your caregiver will check your cervix. At the same time, your caregiver will also perform a test on the secretions of the vaginal tissue. This test is to determine if a type of bacteria, Group B streptococcus, is present. Your caregiver will explain this further. Your caregiver may ask you:  What your birth plan is.  How you are feeling.  If you are feeling the baby move.  If you have had any abnormal symptoms, such as leaking fluid, bleeding, severe headaches, or abdominal cramping.  If you have any questions. Other tests or screenings that may be performed during your third trimester include:  Blood tests that check for low iron levels (anemia).  Fetal testing to check the health, activity level, and growth of the fetus. Testing is done if you have certain medical conditions or if there are problems during the pregnancy. FALSE LABOR You may feel small, irregular contractions that   eventually go away. These are called Braxton Hicks contractions, or false labor. Contractions may last for hours, days, or even weeks before true labor sets in. If contractions come at regular intervals, intensify, or become painful, it is best to be seen by your caregiver.  SIGNS OF LABOR   Menstrual-like cramps.  Contractions that are 5 minutes apart or less.  Contractions that start on the top of the uterus and spread down to the lower abdomen and back.  A sense of increased pelvic pressure or back  pain.  A watery or bloody mucus discharge that comes from the vagina. If you have any of these signs before the 37th week of pregnancy, call your caregiver right away. You need to go to the hospital to get checked immediately. HOME CARE INSTRUCTIONS   Avoid all smoking, herbs, alcohol, and unprescribed drugs. These chemicals affect the formation and growth of the baby.  Follow your caregiver's instructions regarding medicine use. There are medicines that are either safe or unsafe to take during pregnancy.  Exercise only as directed by your caregiver. Experiencing uterine cramps is a good sign to stop exercising.  Continue to eat regular, healthy meals.  Wear a good support bra for breast tenderness.  Do not use hot tubs, steam rooms, or saunas.  Wear your seat belt at all times when driving.  Avoid raw meat, uncooked cheese, cat litter boxes, and soil used by cats. These carry germs that can cause birth defects in the baby.  Take your prenatal vitamins.  Try taking a stool softener (if your caregiver approves) if you develop constipation. Eat more high-fiber foods, such as fresh vegetables or fruit and whole grains. Drink plenty of fluids to keep your urine clear or pale yellow.  Take warm sitz baths to soothe any pain or discomfort caused by hemorrhoids. Use hemorrhoid cream if your caregiver approves.  If you develop varicose veins, wear support hose. Elevate your feet for 15 minutes, 3-4 times a day. Limit salt in your diet.  Avoid heavy lifting, wear low heal shoes, and practice good posture.  Rest a lot with your legs elevated if you have leg cramps or low back pain.  Visit your dentist if you have not gone during your pregnancy. Use a soft toothbrush to brush your teeth and be gentle when you floss.  A sexual relationship may be continued unless your caregiver directs you otherwise.  Do not travel far distances unless it is absolutely necessary and only with the approval  of your caregiver.  Take prenatal classes to understand, practice, and ask questions about the labor and delivery.  Make a trial run to the hospital.  Pack your hospital bag.  Prepare the baby's nursery.  Continue to go to all your prenatal visits as directed by your caregiver. SEEK MEDICAL CARE IF:  You are unsure if you are in labor or if your water has broken.  You have dizziness.  You have mild pelvic cramps, pelvic pressure, or nagging pain in your abdominal area.  You have persistent nausea, vomiting, or diarrhea.  You have a bad smelling vaginal discharge.  You have pain with urination. SEEK IMMEDIATE MEDICAL CARE IF:   You have a fever.  You are leaking fluid from your vagina.  You have spotting or bleeding from your vagina.  You have severe abdominal cramping or pain.  You have rapid weight loss or gain.  You have shortness of breath with chest pain.  You notice sudden or extreme swelling   of your face, hands, ankles, feet, or legs.  You have not felt your baby move in over an hour.  You have severe headaches that do not go away with medicine.  You have vision changes. Document Released: 03/01/2001 Document Revised: 03/12/2013 Document Reviewed: 05/08/2012 [employer] Patient Information 2015 [employer]. This information is not intended to replace advice given to you by your health care provider. Make sure you discuss any questions you have with your health care provider.  

## 2014-09-25 NOTE — MAU Note (Signed)
Stomach keeps tightning up every . No leaking. No bleeding.  Baby moving well.

## 2014-09-25 NOTE — Progress Notes (Signed)
Pt reports she has not seen Dr Clearance CootsHarper since she was 4 months along.  She said she has had a problem with her Medicaid and has not been able to  continue her prenatal care

## 2014-09-25 NOTE — MAU Provider Note (Signed)
History     CSN: [id number]  Arrival date and time: 09/25/14 7829   First Provider Initiated Contact with Patient 09/25/14 0206      Chief Complaint  Patient presents with  . Contractions   HPI Comments: Sharon Johnston is a 24 y.o. [id number] at [redacted]w[redacted]d who presents today with abdominal pain. She thinks she may have a UTI. She denies any contractions, VB or LOF. She states that the fetus has been active. She has not had prenatal care. She has had one appointment with Dr. Clearance Coots, and the rest of the time she has been seen here in MAU. She would like a clinic appointment if possible.   Abdominal Pain This is a new problem. The current episode started in the past 7 days. The onset quality is gradual. The problem occurs constantly. The problem has been unchanged. The pain is located in the suprapubic region. The pain is at a severity of 7/10. The quality of the pain is cramping. The abdominal pain does not radiate. Associated symptoms include dysuria. Pertinent negatives include no constipation, diarrhea, fever, frequency, nausea or vomiting. Nothing aggravates the pain. The pain is relieved by nothing. She has tried nothing for the symptoms.    Past Medical History  Diagnosis Date  . Depression     hospitalized for 2 weeks after suicide att  . Hx of gonorrhea   . Hypertension     gestational HTN  . Kidney infection     Past Surgical History  Procedure Laterality Date  . No past surgeries      Family History  Problem Relation Age of Onset  . Hypertension Mother   . Hypertension Maternal Aunt   . Hypertension Maternal Grandmother   . Depression Cousin     History  Substance Use Topics  . Smoking status: Never Smoker   . Smokeless tobacco: Never Used  . Alcohol Use: No    Allergies: No Known Allergies  Prescriptions prior to admission  Medication Sig Dispense Refill Last Dose  . Prenatal Vit-Fe Fumarate-FA (PRENATAL VITAMIN) 27-0.8 MG TABS Take 1 tablet by mouth daily. 30  tablet 2 09/24/2014 at Unknown time    Review of Systems  Constitutional: Negative for fever.  Gastrointestinal: Positive for abdominal pain. Negative for nausea, vomiting, diarrhea and constipation.  Genitourinary: Positive for dysuria. Negative for urgency and frequency.   Physical Exam   Last menstrual period 12/31/2013, not currently breastfeeding.  Physical Exam  Nursing note and vitals reviewed. Constitutional: She is oriented to person, place, and time. She appears well-developed and well-nourished. No distress.  Cardiovascular: Normal rate.   GI: Soft. There is no tenderness. There is no rebound.  Genitourinary:  Cervix: FT/thick/-2 per RN   Neurological: She is alert and oriented to person, place, and time.  Skin: Skin is warm and dry.  Psychiatric: She has a normal mood and affect.   FHT 135, moderate with 15x15 accels, no decels Toco: no UCs   Results for orders placed or performed during the hospital encounter of 09/25/14 (from the past 24 hour(s))  Urinalysis, Routine w reflex microscopic (not at [facility])     Status: Abnormal   Collection Time: 09/25/14  1:38 AM  Result Value Ref Range   Color, Urine YELLOW YELLOW   APPearance CLEAR CLEAR   Specific Gravity, Urine >1.030 (H) 1.005 - 1.030   pH 5.5 5.0 - 8.0   Glucose, UA NEGATIVE NEGATIVE mg/dL   Hgb urine dipstick TRACE (A) NEGATIVE   Bilirubin  Urine NEGATIVE NEGATIVE   Ketones, ur NEGATIVE NEGATIVE mg/dL   Protein, ur NEGATIVE NEGATIVE mg/dL   Urobilinogen, UA 1.0 0.0 - 1.0 mg/dL   Nitrite POSITIVE (A) NEGATIVE   Leukocytes, UA TRACE (A) NEGATIVE  Urine rapid drug screen (hosp performed)     Status: None   Collection Time: 09/25/14  1:38 AM  Result Value Ref Range   Opiates NONE DETECTED NONE DETECTED   Cocaine NONE DETECTED NONE DETECTED   Benzodiazepines NONE DETECTED NONE DETECTED   Amphetamines NONE DETECTED NONE DETECTED   Tetrahydrocannabinol NONE DETECTED NONE DETECTED   Barbiturates NONE DETECTED  NONE DETECTED  Urine microscopic-add on     Status: Abnormal   Collection Time: 09/25/14  1:38 AM  Result Value Ref Range   Squamous Epithelial / LPF RARE RARE   WBC, UA 3-6 <3 WBC/hpf   RBC / HPF 3-6 <3 RBC/hpf   Bacteria, UA MANY (A) RARE    MAU Course  Procedures  MDM   Assessment and Plan   1. UTI in pregnancy, antepartum, third trimester    DC home Comfort measures reviewed  UC pending GBS pending  3rd Trimester precautions  PTL precautions  Fetal kick counts RX: keflex TID #21, 0RF  [facility].   Follow-up Information    [facility]   Specialty:  Obstetrics and Gynecology   Why:  they will call you with an appointment    Contact information:   [address] [place] [id number] [phone]        Tawnya CrookHogan, Heather Donovan 09/25/2014, 2:15 AM

## 2014-09-26 LAB — CULTURE, BETA STREP (GROUP B ONLY)

## 2014-09-27 LAB — URINE CULTURE: Culture: >=100000

## 2014-10-07 ENCOUNTER — Inpatient Hospital Stay (HOSPITAL_COMMUNITY)
Admission: AD | Admit: 2014-10-07 | Discharge: 2014-10-10 | DRG: 775 | Disposition: A | Discharge status: Home / Self Care | Payer: Medicaid Other | Source: Ambulatory Visit | Attending: Family Medicine | Admitting: Family Medicine

## 2014-10-07 ENCOUNTER — Encounter (HOSPITAL_COMMUNITY): Payer: Self-pay | Admitting: *Deleted

## 2014-10-07 ENCOUNTER — Encounter: Payer: Self-pay | Admitting: Advanced Practice Midwife

## 2014-10-07 ENCOUNTER — Ambulatory Visit (INDEPENDENT_AMBULATORY_CARE_PROVIDER_SITE_OTHER): Payer: Self-pay | Admitting: Advanced Practice Midwife

## 2014-10-07 VITALS — BP 148/104 | HR 74 | Temp 98.9°F | Wt 142.4 lb

## 2014-10-07 DIAGNOSIS — O99343 Other mental disorders complicating pregnancy, third trimester: Secondary | ICD-10-CM

## 2014-10-07 DIAGNOSIS — O0933 Supervision of pregnancy with insufficient antenatal care, third trimester: Secondary | ICD-10-CM

## 2014-10-07 DIAGNOSIS — IMO0001 Reserved for inherently not codable concepts without codable children: Secondary | ICD-10-CM

## 2014-10-07 DIAGNOSIS — Z3A37 37 weeks gestation of pregnancy: Secondary | ICD-10-CM | POA: Diagnosis present

## 2014-10-07 DIAGNOSIS — Z3483 Encounter for supervision of other normal pregnancy, third trimester: Secondary | ICD-10-CM | POA: Diagnosis present

## 2014-10-07 DIAGNOSIS — O133 Gestational [pregnancy-induced] hypertension without significant proteinuria, third trimester: Secondary | ICD-10-CM | POA: Diagnosis present

## 2014-10-07 DIAGNOSIS — F329 Major depressive disorder, single episode, unspecified: Secondary | ICD-10-CM

## 2014-10-07 DIAGNOSIS — O99824 Streptococcus B carrier state complicating childbirth: Secondary | ICD-10-CM | POA: Diagnosis present

## 2014-10-07 DIAGNOSIS — O0993 Supervision of high risk pregnancy, unspecified, third trimester: Secondary | ICD-10-CM

## 2014-10-07 DIAGNOSIS — O2343 Unspecified infection of urinary tract in pregnancy, third trimester: Secondary | ICD-10-CM

## 2014-10-07 LAB — CBC WITH DIFFERENTIAL/PLATELET
BASOS ABS: 0 10*3/uL (ref 0.0–0.1)
Basophils Relative: 0 % (ref 0–1)
EOS ABS: 0.1 10*3/uL (ref 0.0–0.7)
EOS PCT: 1 % (ref 0–5)
HCT: 31.3 % — ABNORMAL LOW (ref 36.0–46.0)
Hemoglobin: 9.9 g/dL — ABNORMAL LOW (ref 12.0–15.0)
Lymphocytes Relative: 24 % (ref 12–46)
Lymphs Abs: 1 10*3/uL (ref 0.7–4.0)
MCH: 26.4 pg (ref 26.0–34.0)
MCHC: 31.6 g/dL (ref 30.0–36.0)
MCV: 83.5 fL (ref 78.0–100.0)
Monocytes Absolute: 0.3 10*3/uL (ref 0.1–1.0)
Monocytes Relative: 7 % (ref 3–12)
NEUTROS ABS: 2.8 10*3/uL (ref 1.7–7.7)
NEUTROS PCT: 68 % (ref 43–77)
PLATELETS: 282 10*3/uL (ref 150–400)
RBC: 3.75 MIL/uL — AB (ref 3.87–5.11)
RDW: 13 % (ref 11.5–15.5)
WBC: 4.1 10*3/uL (ref 4.0–10.5)

## 2014-10-07 LAB — POCT URINALYSIS DIP (DEVICE)
Bilirubin Urine: NEGATIVE
Glucose, UA: NEGATIVE mg/dL
Ketones, ur: NEGATIVE mg/dL
Nitrite: NEGATIVE
Protein, ur: 30 mg/dL — AB
Specific Gravity, Urine: 1.025 (ref 1.005–1.030)
UROBILINOGEN UA: 1 mg/dL (ref 0.0–1.0)
pH: 6.5 (ref 5.0–8.0)

## 2014-10-07 LAB — URINALYSIS, ROUTINE W REFLEX MICROSCOPIC
Bilirubin Urine: NEGATIVE
GLUCOSE, UA: NEGATIVE mg/dL
Ketones, ur: NEGATIVE mg/dL
NITRITE: NEGATIVE
PH: 6 (ref 5.0–8.0)
Protein, ur: NEGATIVE mg/dL
SPECIFIC GRAVITY, URINE: 1.025 (ref 1.005–1.030)
Urobilinogen, UA: 1 mg/dL (ref 0.0–1.0)

## 2014-10-07 LAB — PROTEIN / CREATININE RATIO, URINE
CREATININE, URINE: 230 mg/dL
Protein Creatinine Ratio: 0.14 mg/mg{Cre} (ref 0.00–0.15)
Total Protein, Urine: 33 mg/dL

## 2014-10-07 LAB — TYPE AND SCREEN
ABO/RH(D): A POS
Antibody Screen: NEGATIVE

## 2014-10-07 LAB — COMPREHENSIVE METABOLIC PANEL
ALT: 9 U/L — ABNORMAL LOW (ref 14–54)
ANION GAP: 4 — AB (ref 5–15)
AST: 17 U/L (ref 15–41)
Albumin: 3.2 g/dL — ABNORMAL LOW (ref 3.5–5.0)
Alkaline Phosphatase: 113 U/L (ref 38–126)
BUN: 17 mg/dL (ref 6–20)
CO2: 23 mmol/L (ref 22–32)
CREATININE: 0.75 mg/dL (ref 0.44–1.00)
Calcium: 8.5 mg/dL — ABNORMAL LOW (ref 8.9–10.3)
Chloride: 107 mmol/L (ref 101–111)
GFR calc Af Amer: >60 mL/min (ref >60)
GLUCOSE: 124 mg/dL — AB (ref 65–99)
POTASSIUM: 3.6 mmol/L (ref 3.5–5.1)
Sodium: 134 mmol/L — ABNORMAL LOW (ref 135–145)
Total Bilirubin: 0.8 mg/dL (ref 0.3–1.2)
Total Protein: 6.9 g/dL (ref 6.5–8.1)

## 2014-10-07 LAB — URINE MICROSCOPIC-ADD ON

## 2014-10-07 MED ORDER — PENICILLIN G POTASSIUM 5000000 UNITS IJ SOLR
2.5000 10*6.[IU] | INTRAVENOUS | Status: DC
  Filled 2014-10-07 (×2): qty 2.5

## 2014-10-07 MED ORDER — LACTATED RINGERS IV SOLN
INTRAVENOUS | Status: DC
  Administered 2014-10-07: 17:00:00 via INTRAVENOUS

## 2014-10-07 MED ORDER — OXYTOCIN 40 UNITS IN LACTATED RINGERS INFUSION - SIMPLE MED
62.5000 mL/h | INTRAVENOUS | Status: DC
  Filled 2014-10-07: qty 1000

## 2014-10-07 MED ORDER — OXYTOCIN BOLUS FROM INFUSION: 500.0000 mL | INTRAVENOUS | Status: DC

## 2014-10-07 MED ORDER — ONDANSETRON HCL 4 MG/2ML IJ SOLN: 4.0000 mg | Freq: Four times a day (QID) | INTRAMUSCULAR | Status: DC | PRN

## 2014-10-07 MED ORDER — LIDOCAINE HCL (PF) 1 % IJ SOLN
30.0000 mL | INTRAMUSCULAR | Status: DC | PRN
  Filled 2014-10-07: qty 30

## 2014-10-07 MED ORDER — LACTATED RINGERS IV SOLN: 500.0000 mL | INTRAVENOUS | Status: DC | PRN

## 2014-10-07 MED ORDER — MISOPROSTOL 25 MCG QUARTER TABLET
25.0000 ug | ORAL_TABLET | ORAL | Status: DC | PRN
Start: 2014-10-07 — End: 2014-10-08
  Administered 2014-10-07: 25 ug via VAGINAL
  Filled 2014-10-07: qty 1
  Filled 2014-10-07: qty 0.25

## 2014-10-07 MED ORDER — ACETAMINOPHEN 325 MG PO TABS: 650.0000 mg | ORAL_TABLET | ORAL | Status: DC | PRN

## 2014-10-07 MED ORDER — FENTANYL CITRATE (PF) 100 MCG/2ML IJ SOLN
100.0000 ug | INTRAMUSCULAR | Status: DC | PRN
  Administered 2014-10-08: 100 ug via INTRAVENOUS
  Filled 2014-10-07: qty 2

## 2014-10-07 MED ORDER — DEXTROSE 5 % IV SOLN
5.0000 10*6.[IU] | Freq: Once | INTRAVENOUS | Status: DC
  Filled 2014-10-07: qty 5

## 2014-10-07 MED ORDER — TERBUTALINE SULFATE 1 MG/ML IJ SOLN: 0.2500 mg | Freq: Once | INTRAMUSCULAR | Status: AC | PRN

## 2014-10-07 MED ORDER — OXYCODONE-ACETAMINOPHEN 5-325 MG PO TABS: 1.0000 | ORAL_TABLET | ORAL | Status: DC | PRN

## 2014-10-07 MED ORDER — OXYCODONE-ACETAMINOPHEN 5-325 MG PO TABS: 2.0000 | ORAL_TABLET | ORAL | Status: DC | PRN

## 2014-10-07 MED ORDER — CITRIC ACID-SODIUM CITRATE 334-500 MG/5ML PO SOLN: 30.0000 mL | ORAL | Status: DC | PRN

## 2014-10-07 NOTE — Progress Notes (Signed)
Here for initial prenatal visit. Has been to MAU.[facility].  States has not picked up prenatal vitamins or keflex yet because waiting on Medicaid. Denies headaches, but states does see black spots sometimes.  Report called to MAU charge nurse Jolynn and patient taken up after ob visit completed.

## 2014-10-07 NOTE — Progress Notes (Signed)
L Leftwich-Kirby CNM on unit. EFM strip reviewed and will see pt

## 2014-10-07 NOTE — Progress Notes (Signed)
Labor Progress Note  S: comfortable with ctxs  O:  BP 150/85 mmHg  Pulse 55  Temp(Src) 98.7 F (37.1 C) (Oral)  Resp 18  Ht 5\' 3"  (1.6 m)  Wt 142 lb (64.411 kg)  BMI 25.16 kg/m2  LMP 12/31/2013 FHR: baseline 130, good variability, + accels, no decels Toco: irregular ctx every 3-6 min CVE: Dilation: Fingertip Effacement (%): Thick Cervical Position: Posterior Station: -3 Presentation: Vertex Exam by:: ansah-mensah, rnc   A&P: 24 y.o. [id number] 3148w4d IOL 2/2 gHTN #labor: will continue with cytotec for cervical ripening, FB when appropriate #pain: planning on epidural  #gHTN: has had a few elevated BP, continue to monitor   De Hollingsheadatherine L Laketia Vicknair, DO 10:54 PM

## 2014-10-07 NOTE — H&P (Signed)
Sharon Johnston is a 24 y.o. female [phone]  sent to MAU from initial OB visit in clinic for evaluation of HTN.  She started started prenatal care with Dr Clearance Coots then lost her insurance and has only had MAU visits since early pregnancy.  She reports history of postpartum preeclampsia with both of her previous term pregnancies.  She reports some occasional dark spots in her vision starting yesterday. She reports good fetal movement, denies LOF, vaginal bleeding, vaginal itching/burning, urinary symptoms, h/a, epigastric pain, dizziness, n/v, or fever/chills.     Clinic [facility] r/t limited [facility] and  HTN in pregnancy Prenatal Labs  Dating 7 week U/S Blood type: A/POS/-- (02/16 1415)   Genetic Screen 1 Screen:    AFP:     Quad:     NIPS: Antibody:NEG (02/16 1415)  Anatomic US wnl Rubella: 1.70 (02/16 1415)  GTT   Third trimester: done 10/07/14 RPR: NON REAC (02/16 1415)   Flu vaccine  HBsAg: NEGATIVE (02/16 1415)   TDaP vaccine                                               Rhogam: HIV: NONREACTIVE (02/16 1415)   Baby Food                                               GBS: (For PCN allergy, check sensitivities)  Contraception  Pap: wnl 2/16  Circumcision    Pediatrician    Support Person       Maternal Medical History:  Reason for admission: Nausea. HTN at term   Contractions: Onset was 1 week ago.   Frequency: irregular.   Perceived severity is mild.    Fetal activity: Perceived fetal activity is normal.   Last perceived fetal movement was within the past hour.    Prenatal complications: no prenatal complications Limited PNC    OB History    Gravida Para Term Preterm AB TAB SAB Ectopic Multiple Living   0 1 0 1 0 0 2     Past Medical History  Diagnosis Date  . Depression     hospitalized for 2 weeks after suicide att  . Hx of gonorrhea   . Hypertension     gestational HTN  . Kidney infection    Past Surgical History  Procedure Laterality Date  . No past surgeries      Family History: family history includes Depression in her cousin; Hypertension in her maternal aunt, maternal grandmother, and mother. Social History:  reports that she has never smoked. She has never used smokeless tobacco. She reports that she does not drink alcohol or use illicit drugs.   Prenatal Transfer Tool  Maternal Diabetes: No Genetic Screening: Normal Maternal Ultrasounds/Referrals: Normal Fetal Ultrasounds or other Referrals:  None Maternal Substance Abuse:  No Significant Maternal Medications:  None Significant Maternal Lab Results:  Lab values include: Group B Strep positive Other Comments:  None  Review of Systems  Constitutional: Negative for fever, chills and malaise/fatigue.  Eyes: Negative for blurred vision.  Respiratory: Negative for cough and shortness of breath.   Cardiovascular: Negative for chest pain.  Gastrointestinal: Negative for heartburn, nausea, vomiting and abdominal pain.  Genitourinary: Negative for dysuria, urgency  and frequency.  Musculoskeletal: Negative.   Neurological: Negative for dizziness and headaches.  Psychiatric/Behavioral: Negative for depression.      Blood pressure 140/90, pulse 86, temperature 98 F (36.7 C), resp. rate 18, last menstrual period 12/31/2013, not currently breastfeeding. Maternal Exam:  Uterine Assessment: Contraction strength is mild.  Contraction duration is 60 seconds. Contraction frequency is irregular.   Abdomen: Patient reports no abdominal tenderness. Cervix: Cervix evaluated by digital exam.     Fetal Exam Fetal Monitor Review: Mode: ultrasound.   Baseline rate: 135.  Variability: moderate (6-25 bpm).   Pattern: accelerations present and no decelerations.    Fetal State Assessment: Category I - tracings are normal.     Physical Exam  Nursing note and vitals reviewed. Constitutional: She is oriented to person, place, and time. She appears well-developed and well-nourished.  Neck: Normal  range of motion.  Cardiovascular: Normal rate and regular rhythm.   Respiratory: Effort normal and breath sounds normal.  GI: Soft.  Musculoskeletal: Normal range of motion.  Neurological: She is alert and oriented to person, place, and time. She has normal reflexes.  Skin: Skin is warm and dry.  Psychiatric: She has a normal mood and affect. Her behavior is normal. Judgment and thought content normal.    Prenatal labs: ABO, Rh: A/POS/-- (02/16 1415) Antibody: NEG (02/16 1415) Rubella: 1.70 (02/16 1415) RPR: NON REAC (02/16 1415)  HBsAg: NEGATIVE (02/16 1415)  HIV: NONREACTIVE (02/16 1415)  GBS:   Positive  Assessment/Plan: Gestational hypertension w/o significant proteinuria in 3rd trimester GBS Positive  [facility] Cytotec pv Q 4 hours Start PCN for GBS prophylaxis Fentanyl 100 mcg Q hour PRN pain Anticipate NSVD   LEFTWICH-KIRBY, Ajahnae Rathgeber 10/07/2014, 2:50 PM

## 2014-10-07 NOTE — Progress Notes (Signed)
Subjective:    Sharon Johnston is a [id number] 5645w4d being seen today for her first obstetrical visit.  Her obstetrical history is significant for pre-eclampsia with previous pregnancies and frequent UTI in this pregnancy. She reports postpartum preeclampsia with readmission with one of her previous pregnancies and HTN with both.  She started prenatal care in February with Dr Clearance CootsHarper but her Medicaid fell through and she has not had care other than MAU until her visit today.  She denies h/a, epigastric pain but does report seeing some black spots in her vision starting yesterday.  She denies regular contractions, LOF, vaginal bleeding, urinary symptoms, n/v, or fever/chills today.  Pregnancy history fully reviewed.    Filed Vitals:   10/07/14 1015 10/07/14 1022  BP: 153/97 148/104  Pulse: 69 74  Temp:  98.9 F (37.2 C)  Weight:  142 lb 6.4 oz (64.592 kg)    HISTORY: OB History  Gravida Para Term Preterm AB SAB TAB Ectopic Multiple Living  4 2 2  0 1 1 0 0 0 2    # Outcome Date GA Lbr Len/2nd Weight Sex Delivery Anes PTL Lv  4 Current           3 Term 08/11/13 2255w4d 13:48 / 00:08 7 lb 3.7 oz (3.28 kg) F Vag-Spont EPI  Y  2 Term 10/18/11 3640w4d 12:30 / 00:30 7 lb 8 oz (3.402 kg) M Vag-Spont EPI  Y  1 SAB  7059w0d            Comments: System Generated. Please review and update pregnancy details.     Past Medical History  Diagnosis Date  . Depression     hospitalized for 2 weeks after suicide att  . Hx of gonorrhea   . Hypertension     gestational HTN  . Kidney infection    Past Surgical History  Procedure Laterality Date  . No past surgeries     Family History  Problem Relation Age of Onset  . Hypertension Mother   . Hypertension Maternal Aunt   . Hypertension Maternal Grandmother   . Depression Cousin      Exam    Cervix 1/long/high, posterior  Uterus:     Pelvic Exam:    Perineum: No Hemorrhoids, Normal Perineum   Vulva: normal   Vagina:  normal mucosa, normal  discharge   pH:    Cervix: multiparous appearance   Adnexa: not evaluated   Bony Pelvis: average  System: Breast:  normal appearance, no masses or tenderness   Skin: normal coloration and turgor, no rashes    Neurologic: oriented, normal, normal mood, gait normal; reflexes normal and symmetric   Extremities: normal strength, tone, and muscle mass, ROM of all joints is normal   HEENT sclera clear, anicteric and neck supple with midline trachea   Mouth/Teeth mucous membranes moist, pharynx normal without lesions and dental hygiene good   Neck supple and no masses   Cardiovascular:    Respiratory:  appears well, vitals normal, no respiratory distress, acyanotic, normal RR, ear and throat exam is normal, neck free of mass or lymphadenopathy   Abdomen: soft, non-tender; bowel sounds normal; no masses,  no organomegaly   Urinary: urethral meatus normal      Assessment:    Pregnancy: [id number] Patient Active Problem List   Diagnosis Date Noted  . UTI (urinary tract infection) 07/19/2014  . Encounter for routine screening for malformation using ultrasonics   . [redacted] weeks gestation of pregnancy   .  Normal delivery 08/11/2013  . Gestational hypertension 08/11/2013  . Active labor 08/10/2013  . Depression 06/15/2013  . Candidiasis of vulva and vagina 05/13/2013  . Vaginitis and vulvovaginitis, unspecified 03/25/2013        Plan:     Initial labs done by Dr Clearance Coots, results reviewed GC/Chlamydia collected Prenatal vitamins. Problem list reviewed and updated. Genetic Screening discussed: Late to care.  Ultrasound discussed; fetal survey: results reviewed. Pt sent to MAU for further evaluation of HTN in pregnancy  Johnston, Sharon Ryder 10/07/2014

## 2014-10-07 NOTE — Progress Notes (Signed)
Dr Freida BusmanAllen notified of pt's status and lab results and b/p readings. Will see pt

## 2014-10-07 NOTE — MAU Note (Signed)
Sent up from clinic for pre eclampsia eval. Denies HA, visual changes, epigastric pain. Having some contractions, maybe every 

## 2014-10-07 NOTE — MAU Note (Signed)
OK for pt to be off EFM per Sharen CounterLisa Leftwich-Kirby CNM

## 2014-10-07 NOTE — MAU Note (Signed)
Pt. Taken via wheelchair to room 163 at this time.

## 2014-10-08 ENCOUNTER — Encounter (HOSPITAL_COMMUNITY): Payer: Self-pay | Admitting: *Deleted

## 2014-10-08 DIAGNOSIS — F329 Major depressive disorder, single episode, unspecified: Secondary | ICD-10-CM

## 2014-10-08 DIAGNOSIS — Z3A37 37 weeks gestation of pregnancy: Secondary | ICD-10-CM

## 2014-10-08 DIAGNOSIS — O99824 Streptococcus B carrier state complicating childbirth: Secondary | ICD-10-CM

## 2014-10-08 DIAGNOSIS — O133 Gestational [pregnancy-induced] hypertension without significant proteinuria, third trimester: Secondary | ICD-10-CM

## 2014-10-08 DIAGNOSIS — O99344 Other mental disorders complicating childbirth: Secondary | ICD-10-CM

## 2014-10-08 LAB — CBC
HCT: 33.3 % — ABNORMAL LOW (ref 36.0–46.0)
Hemoglobin: 10.7 g/dL — ABNORMAL LOW (ref 12.0–15.0)
MCH: 26.6 pg (ref 26.0–34.0)
MCHC: 32.1 g/dL (ref 30.0–36.0)
MCV: 82.6 fL (ref 78.0–100.0)
Platelets: 280 10*3/uL (ref 150–400)
RBC: 4.03 MIL/uL (ref 3.87–5.11)
RDW: 13 % (ref 11.5–15.5)
WBC: 8 10*3/uL (ref 4.0–10.5)

## 2014-10-08 LAB — SYPHILIS: RPR W/REFLEX TO RPR TITER AND TREPONEMAL ANTIBODIES, TRADITIONAL SCREENING AND DIAGNOSIS ALGORITHM: RPR Ser Ql: NONREACTIVE

## 2014-10-08 LAB — GC/CHLAMYDIA PROBE AMP
CT Probe RNA: NEGATIVE
GC PROBE AMP APTIMA: NEGATIVE

## 2014-10-08 LAB — GLUCOSE TOLERANCE, 1 HOUR (50G) W/O FASTING: Glucose, 1 Hour GTT: 126 mg/dL (ref 70–140)

## 2014-10-08 MED ORDER — DIPHENHYDRAMINE HCL 50 MG/ML IJ SOLN: 12.5000 mg | INTRAMUSCULAR | Status: DC | PRN

## 2014-10-08 MED ORDER — OXYCODONE-ACETAMINOPHEN 5-325 MG PO TABS
1.0000 | ORAL_TABLET | ORAL | Status: DC | PRN
  Administered 2014-10-08 – 2014-10-09 (×3): 1 via ORAL
  Filled 2014-10-08 (×3): qty 1

## 2014-10-08 MED ORDER — LANOLIN HYDROUS EX OINT: TOPICAL_OINTMENT | CUTANEOUS | Status: DC | PRN

## 2014-10-08 MED ORDER — IBUPROFEN 600 MG PO TABS
600.0000 mg | ORAL_TABLET | Freq: Four times a day (QID) | ORAL | Status: DC
  Administered 2014-10-08 – 2014-10-10 (×10): 600 mg via ORAL
  Filled 2014-10-08 (×10): qty 1

## 2014-10-08 MED ORDER — PHENYLEPHRINE 40 MCG/ML (10ML) SYRINGE FOR IV PUSH (FOR BLOOD PRESSURE SUPPORT)
80.0000 ug | PREFILLED_SYRINGE | INTRAVENOUS | Status: DC | PRN
  Filled 2014-10-08: qty 20
  Filled 2014-10-08: qty 2

## 2014-10-08 MED ORDER — DIBUCAINE 1 % RE OINT: 1.0000 "application " | TOPICAL_OINTMENT | RECTAL | Status: DC | PRN

## 2014-10-08 MED ORDER — EPHEDRINE 5 MG/ML INJ
10.0000 mg | INTRAVENOUS | Status: DC | PRN
  Filled 2014-10-08: qty 2

## 2014-10-08 MED ORDER — ZOLPIDEM TARTRATE 5 MG PO TABS: 5.0000 mg | ORAL_TABLET | Freq: Every evening | ORAL | Status: DC | PRN

## 2014-10-08 MED ORDER — PENICILLIN G POTASSIUM 5000000 UNITS IJ SOLR
2.5000 10*6.[IU] | INTRAMUSCULAR | Status: DC
  Filled 2014-10-08 (×4): qty 2.5

## 2014-10-08 MED ORDER — PENICILLIN G POTASSIUM 5000000 UNITS IJ SOLR
5.0000 10*6.[IU] | Freq: Once | INTRAVENOUS | Status: AC
  Administered 2014-10-08: 5 10*6.[IU] via INTRAVENOUS
  Filled 2014-10-08: qty 5

## 2014-10-08 MED ORDER — BENZOCAINE-MENTHOL 20-0.5 % EX AERO: 1.0000 "application " | INHALATION_SPRAY | CUTANEOUS | Status: DC | PRN

## 2014-10-08 MED ORDER — OXYCODONE-ACETAMINOPHEN 5-325 MG PO TABS: 2.0000 | ORAL_TABLET | ORAL | Status: DC | PRN

## 2014-10-08 MED ORDER — ONDANSETRON HCL 4 MG/2ML IJ SOLN: 4.0000 mg | INTRAMUSCULAR | Status: DC | PRN

## 2014-10-08 MED ORDER — OXYTOCIN 40 UNITS IN LACTATED RINGERS INFUSION - SIMPLE MED
1.0000 m[IU]/min | INTRAVENOUS | Status: DC
  Administered 2014-10-08: 2 m[IU]/min via INTRAVENOUS

## 2014-10-08 MED ORDER — FENTANYL 2.5 MCG/ML BUPIVACAINE 1/10 % EPIDURAL INFUSION (WH - ANES)
14.0000 mL/h | INTRAMUSCULAR | Status: DC | PRN
  Filled 2014-10-08: qty 125

## 2014-10-08 MED ORDER — WITCH HAZEL-GLYCERIN EX PADS: 1.0000 "application " | MEDICATED_PAD | CUTANEOUS | Status: DC | PRN

## 2014-10-08 MED ORDER — SENNOSIDES-DOCUSATE SODIUM 8.6-50 MG PO TABS
2.0000 | ORAL_TABLET | ORAL | Status: DC
  Administered 2014-10-08 – 2014-10-10 (×2): 2 via ORAL
  Filled 2014-10-08 (×2): qty 2

## 2014-10-08 MED ORDER — AMLODIPINE BESYLATE 5 MG PO TABS
5.0000 mg | ORAL_TABLET | Freq: Every day | ORAL | Status: DC
  Administered 2014-10-08 – 2014-10-10 (×3): 5 mg via ORAL
  Filled 2014-10-08 (×4): qty 1

## 2014-10-08 MED ORDER — LABETALOL HCL 5 MG/ML IV SOLN
20.0000 mg | Freq: Once | INTRAVENOUS | Status: AC
  Administered 2014-10-08: 20 mg via INTRAVENOUS
  Filled 2014-10-08: qty 4

## 2014-10-08 MED ORDER — TETANUS-DIPHTH-ACELL PERTUSSIS 5-2.5-18.5 LF-MCG/0.5 IM SUSP: 0.5000 mL | Freq: Once | INTRAMUSCULAR | Status: DC

## 2014-10-08 MED ORDER — ONDANSETRON HCL 4 MG PO TABS: 4.0000 mg | ORAL_TABLET | ORAL | Status: DC | PRN

## 2014-10-08 MED ORDER — PRENATAL MULTIVITAMIN CH
1.0000 | ORAL_TABLET | Freq: Every day | ORAL | Status: DC
  Administered 2014-10-08 – 2014-10-10 (×3): 1 via ORAL
  Filled 2014-10-08 (×3): qty 1

## 2014-10-08 MED ORDER — TERBUTALINE SULFATE 1 MG/ML IJ SOLN
0.2500 mg | Freq: Once | INTRAMUSCULAR | Status: DC | PRN
  Filled 2014-10-08: qty 1

## 2014-10-08 MED ORDER — FENTANYL CITRATE (PF) 100 MCG/2ML IJ SOLN: 100.0000 ug | INTRAMUSCULAR | Status: DC | PRN

## 2014-10-08 MED ORDER — SIMETHICONE 80 MG PO CHEW: 80.0000 mg | CHEWABLE_TABLET | ORAL | Status: DC | PRN

## 2014-10-08 MED ORDER — ACETAMINOPHEN 325 MG PO TABS: 650.0000 mg | ORAL_TABLET | ORAL | Status: DC | PRN

## 2014-10-08 MED ORDER — DIPHENHYDRAMINE HCL 25 MG PO CAPS: 25.0000 mg | ORAL_CAPSULE | Freq: Four times a day (QID) | ORAL | Status: DC | PRN

## 2014-10-08 MED ORDER — BUTORPHANOL TARTRATE 1 MG/ML IJ SOLN: 1.0000 mg | Freq: Once | INTRAMUSCULAR | Status: DC
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
## 2014-10-08 NOTE — Progress Notes (Signed)
Labor Progress Note  S: Feeling the ctxns  O:  BP 139/74 mmHg  Pulse 65  Temp(Src) 98.7 F (37.1 C) (Oral)  Resp 18  Ht 5\' 3"  (1.6 m)  Wt 142 lb (64.411 kg)  BMI 25.16 kg/m2  LMP 12/31/2013 FHR: baseline 130, good variability, + accels, occ variable decels Toco: ctx every 2-4 min CVE: Dilation: 2.5 Effacement (%): 60 Cervical Position: Posterior Station: -3 Presentation: Vertex Exam by:: Dr.Stinson  A&P: 24 y.o. [id number] 759w5d IOL 2/2 gHTN #labor: good cervical change with cytotec, FB placed @1300    De Hollingsheadatherine L Amber Guthridge, DO 1:47 AM

## 2014-10-09 NOTE — Progress Notes (Cosign Needed)
Post Partum Day #1 Subjective:  Sharon Johnston is a 24 y.o. [id number] [redacted]w[redacted]d.  No acute events overnight.  Pt denies problems with ambulating, voiding or po intake. She denies nausea or vomiting.  She does endorse some cramping that has been present since her delivery. Pain is well controlled and is rated as a 3/10.  She has had flatus. She has not had bowel movement.  Lochia Minimal.  Plan for birth control is nexplanon.  Method of Feeding: Formula  Objective: Blood pressure 141/76, pulse 80, temperature 98.1 F (36.7 C), temperature source Oral, resp. rate 18, height  (1.6 m), weight 64.411 kg (142 lb), last menstrual period 12/31/2013, unknown if currently breastfeeding.  Physical Exam:  General: alert, cooperative and no distress Lochia:normal flow Chest: CTAB Heart: RRR no m/r/g Abdomen: +BS, soft, nontender,  Uterine Fundus: firm DVT Evaluation: No evidence of DVT seen on physical exam. Extremities: no peripheral edema   Recent Labs  10/07/14 1115 10/08/14 0301  HGB 9.9* 10.7*  HCT 31.3* 33.3*    Assessment/Plan:  ASSESSMENT: Sharon Johnston is a 24 y.o. [id number] [redacted]w[redacted]d s/p SVD and a history of gHTN for which she was given Norvasc. Her blood pressure peak has been 182/99 and lowest at  129/55. Her last BP was taken this morning(10/09/2014) at 6:15am and was 125/78. Her pre-eclampsia labs were normal, but she is GBS+ and is untreated.  Plan: Continue her on Norvasc low dose for BP control until her next clinic visit ~ 2 weeks, reassess BP at that time to see if gHTN has subsided or if she will continue to need meds for BP control. Discharge is due for Friday due to untreated GBS+ status.  Plan for discharge tomorrow   LOS: 2 days   Kandyce Rud. 10/09/2014, 11:27 AM

## 2014-10-09 NOTE — Progress Notes (Signed)
Post Partum Day 1 Subjective: no complaints, up ad lib, voiding, tolerating PO and + flatus She is bottle feeding her infant. Does not want to breastfeed.  Objective: Blood pressure 141/76, pulse 80, temperature 98.1 F (36.7 C), temperature source Oral, resp. rate 18, height  (1.6 m), weight 142 lb (64.411 kg), last menstrual period 12/31/2013, unknown if currently breastfeeding. Filed Vitals:   10/08/14 1222 10/08/14 1706 10/09/14 0617 10/09/14 1048  BP: 148/83 144/76 125/78 141/76  Pulse: 73 60 59 80  Temp:  98.3 F (36.8 C) 98.1 F (36.7 C)   TempSrc:  Oral Oral   Resp:  20 18   Height:      Weight:        Physical Exam:  General: alert, cooperative and no distress Lochia: appropriate Uterine Fundus: firm Incision: healing well DVT Evaluation: No evidence of DVT seen on physical exam.   Recent Labs  10/07/14 1115 10/08/14 0301  HGB 9.9* 10.7*  HCT 31.3* 33.3*    Assessment/Plan: A:  Postpartum Day #1       Gestational Hypertension without signs of preeclampsia  Plan for discharge tomorrow, Social Work consult and Contraception Nexplanon   Plan: Continue her on Norvasc low dose for BP control until her next clinic visit ~ 2 weeks, reassess BP at that time to see if gHTN has subsided or if she will continue to need meds for BP control. Discharge is due for Friday due to untreated GBS+ status.  Plan for discharge tomorrow   LOS: 2 days   [facility] 10/09/2014, 1:32 PM

## 2014-10-09 NOTE — Progress Notes (Signed)
CSW notified by staff that worker from Child Protective Services is here to see MOB.  CSW spoke with J. Avillion/CPS worker who states baby may discharge with parents as long as drug screens are negative.  CSW notes no drug screens pending for baby in [facility].  CSW spoke with Dr. McCormick/Pediatrician who ordered screens.  CSW will notify CPS with results. 

## 2014-10-10 MED ORDER — AMLODIPINE BESYLATE 5 MG PO TABS: 5.0000 mg | ORAL_TABLET | Freq: Every day | ORAL | Status: DC

## 2014-10-10 MED ORDER — IBUPROFEN 600 MG PO TABS: 600.0000 mg | ORAL_TABLET | Freq: Four times a day (QID) | ORAL | Status: DC

## 2014-10-10 NOTE — Discharge Instructions (Signed)

## 2014-10-10 NOTE — Discharge Summary (Signed)
Obstetric Discharge Summary Reason for Admission: induction of labor for HTN Prenatal Procedures: none Intrapartum Procedures: spontaneous vaginal delivery Postpartum Procedures: none Complications-Operative and Postpartum: HTN  Delivery Note At 3:14 AM a viable female was delivered via (Presentation: Occiput anterior; ). APGAR:9 ,9 ; weight pending .  Placenta status:intact ,spontaneous . Cord: 3 vessel cord with the following complications:none . Cord pH: N/A  Anesthesia: none Episiotomy: none Lacerations: none Suture Repair: none Est. Blood Loss (mL): 76  Mom to postpartum. Baby to Couplet care / Skin to Skin.   Hospital Course:  Active Problems:   Gestational hypertension   Sharon Johnston is a 24 y.o. [phone] s/p SVD.  Patient was admitted 7/19 for IOL 2/2 HTN.  She has postpartum course that was complicated by continued HTN, started on Norvasc. The pt feels ready to go home and  will be discharged with outpatient follow-up.   Today: No acute events overnight.  Pt denies problems with ambulating, voiding or po intake.  She denies nausea or vomiting.  Pain is well controlled.  She has had flatus. She has had bowel movement.  Lochia Minimal.  Plan for birth control is  Nexplanon.  Method of Feeding: Bottle  Physical Exam:  General: alert, cooperative and appears stated age 75: appropriate Uterine Fundus: firm DVT Evaluation: No evidence of DVT seen on physical exam.  H/H: Lab Results  Component Value Date/Time   HGB 10.7* 10/08/2014 03:01 AM   HCT 33.3* 10/08/2014 03:01 AM    Discharge Diagnoses: Term Pregnancy-delivered, Hypertension during pregnancy  Discharge Information: Date: 10/10/2014 Activity: pelvic rest Diet: routine  Medications: Ibuprofen and Norvasc Breast feeding:  No:  Condition: stable Instructions: refer to handout Discharge to: home  At 1 wk f/u appt, pt will need reevaluation of blood pressure to determine need for continued  Norvasc. Will also need nexplanon placement.      Discharge Instructions    Discharge patient    Complete by:  As directed             Medication List    TAKE these medications        amLODipine 5 MG tablet  Commonly known as:  NORVASC  Take 1 tablet (5 mg total) by mouth daily.     ibuprofen 600 MG tablet  Commonly known as:  ADVIL,MOTRIN  Take 1 tablet (600 mg total) by mouth every 6 (six) hours.       Follow-up Information    Follow up with [facility]. Schedule an appointment as soon as possible for a visit in 1 week.   Specialty:  Obstetrics and Gynecology   Why:  for BP check    Contact information:   [address] [place] [address] [phone]      Lowanda Foster ,MD 10/10/2014,7:46 AM    I have seen and examined the patient and agree with the above.  Respiratory effort normal, lochia appropriate, legs negative,  pain level normal.

## 2014-10-15 ENCOUNTER — Encounter: Payer: Self-pay | Admitting: Certified Nurse Midwife

## 2014-11-12 ENCOUNTER — Encounter: Payer: Self-pay | Admitting: Obstetrics and Gynecology

## 2014-11-13 ENCOUNTER — Ambulatory Visit: Payer: Self-pay | Admitting: Certified Nurse Midwife

## 2015-03-27 ENCOUNTER — Encounter (HOSPITAL_COMMUNITY): Payer: Self-pay | Admitting: *Deleted

## 2015-03-27 ENCOUNTER — Inpatient Hospital Stay (HOSPITAL_COMMUNITY)
Admission: AD | Admit: 2015-03-27 | Discharge: 2015-03-27 | Disposition: A | Discharge status: Home / Self Care | Payer: Medicaid Other | Source: Ambulatory Visit | Attending: Obstetrics and Gynecology | Admitting: Obstetrics and Gynecology

## 2015-03-27 DIAGNOSIS — N898 Other specified noninflammatory disorders of vagina: Secondary | ICD-10-CM | POA: Insufficient documentation

## 2015-03-27 DIAGNOSIS — Z113 Encounter for screening for infections with a predominantly sexual mode of transmission: Secondary | ICD-10-CM | POA: Diagnosis not present

## 2015-03-27 DIAGNOSIS — N939 Abnormal uterine and vaginal bleeding, unspecified: Secondary | ICD-10-CM | POA: Diagnosis present

## 2015-03-27 DIAGNOSIS — N930 Postcoital and contact bleeding: Secondary | ICD-10-CM

## 2015-03-27 DIAGNOSIS — N390 Urinary tract infection, site not specified: Secondary | ICD-10-CM

## 2015-03-27 DIAGNOSIS — Z114 Encounter for screening for human immunodeficiency virus [HIV]: Secondary | ICD-10-CM | POA: Diagnosis not present

## 2015-03-27 DIAGNOSIS — F17200 Nicotine dependence, unspecified, uncomplicated: Secondary | ICD-10-CM | POA: Diagnosis not present

## 2015-03-27 DIAGNOSIS — F329 Major depressive disorder, single episode, unspecified: Secondary | ICD-10-CM | POA: Insufficient documentation

## 2015-03-27 DIAGNOSIS — I10 Essential (primary) hypertension: Secondary | ICD-10-CM | POA: Diagnosis not present

## 2015-03-27 HISTORY — DX: Chlamydial infection, unspecified: A74.9

## 2015-03-27 LAB — WET PREP, GENITAL
Clue Cells Wet Prep HPF POC: NONE SEEN
Sperm: NONE SEEN
Trich, Wet Prep: NONE SEEN
Yeast Wet Prep HPF POC: NONE SEEN

## 2015-03-27 LAB — URINALYSIS, ROUTINE W REFLEX MICROSCOPIC
BILIRUBIN URINE: NEGATIVE
Glucose, UA: NEGATIVE mg/dL
Ketones, ur: 15 mg/dL — AB
NITRITE: POSITIVE — AB
PH: 6 (ref 5.0–8.0)
Protein, ur: NEGATIVE mg/dL
Specific Gravity, Urine: >1.03 — ABNORMAL HIGH (ref 1.005–1.030)

## 2015-03-27 LAB — URINE MICROSCOPIC-ADD ON

## 2015-03-27 LAB — POCT PREGNANCY, URINE: Preg Test, Ur: NEGATIVE

## 2015-03-27 MED ORDER — CEPHALEXIN 500 MG PO CAPS: 500.0000 mg | ORAL_CAPSULE | Freq: Two times a day (BID) | ORAL | Status: DC

## 2015-03-27 MED ORDER — AMLODIPINE BESYLATE 5 MG PO TABS: 5.0000 mg | ORAL_TABLET | Freq: Every day | ORAL | Status: DC

## 2015-03-27 NOTE — MAU Provider Note (Signed)
History   [id number]   Chief Complaint  Patient presents with  . Vaginal Bleeding    HPI Sharon Johnston is a 25 y.o. female  [phone] here with report of spotting of blood after intercourse and +white vaginal discharge with odor x 2 days.  Pt reports negative pregnancy test at home.     Patient's last menstrual period was 03/06/2015.  OB History  Gravida Para Term Preterm AB SAB TAB Ectopic Multiple Living  4 3 3  0 1 1 0 0 0 3    # Outcome Date GA Lbr Len/2nd Weight Sex Delivery Anes PTL Lv  4 Term 10/08/14 [redacted]w[redacted]d 02:40 / 00:04 2.676 kg (5 lb 14.4 oz) F Vag-Spont None  Y  3 Term 08/11/13 [redacted]w[redacted]d 13:48 / 00:08 3.28 kg (7 lb 3.7 oz) F Vag-Spont EPI  Y  2 Term 10/18/11 [redacted]w[redacted]d 12:30 / 00:30 3.402 kg (7 lb 8 oz) M Vag-Spont EPI  Y  1 SAB  [redacted]w[redacted]d            Comments: System Generated. Please review and update pregnancy details.      Past Medical History  Diagnosis Date  . Depression     hospitalized for 2 weeks after suicide att  . Hx of gonorrhea   . Hypertension     gestational HTN  . Kidney infection   . Chlamydia     Family History  Problem Relation Age of Onset  . Hypertension Mother   . Hypertension Maternal Aunt   . Hypertension Maternal Grandmother   . Depression Cousin     Social History   Social History  . Marital Status: Single    Spouse Name: N/A  . Number of Children: N/A  . Years of Education: N/A   Social History Main Topics  . Smoking status: Current Every Day Smoker -- 0.25 packs/day  . Smokeless tobacco: Never Used  . Alcohol Use: No  . Drug Use: No  . Sexual Activity: Yes    Birth Control/ Protection: None   Other Topics Concern  . None   Social History Narrative    No Known Allergies  No current facility-administered medications on file prior to encounter.   Current Outpatient Prescriptions on File Prior to Encounter  Medication Sig Dispense Refill  . amLODipine (NORVASC) 5 MG tablet Take 1 tablet (5 mg total) by mouth daily. (Patient  not taking: Reported on 03/27/2015) 30 tablet 0  . ibuprofen (ADVIL,MOTRIN) 600 MG tablet Take 1 tablet (600 mg total) by mouth every 6 (six) hours. (Patient not taking: Reported on 03/27/2015) 30 tablet 0     Review of Systems  Genitourinary: Positive for vaginal bleeding (spotting) and vaginal discharge. Negative for dysuria, genital sores, pelvic pain and dyspareunia.  Neurological: Positive for headaches (two days ago).  All other systems reviewed and are negative.    Physical Exam   Filed Vitals:   03/27/15 1102 03/27/15 1104 03/27/15 1123  BP:  173/103 154/100  Pulse:  52 59  Temp: 97.6 F (36.4 C)    TempSrc: Oral    Resp: 18    Height: 5\' 2"  (1.575 m)    Weight: 54.885 kg (121 lb)      Physical Exam  Constitutional: She is oriented to person, place, and time. She appears well-developed and well-nourished. No distress.  HENT:  Head: Normocephalic.  Eyes: EOM are normal. Pupils are equal, round, and reactive to light.  Neck: Normal range of motion. Neck supple.  Cardiovascular: Normal rate,  regular rhythm and normal heart sounds.   Respiratory: Effort normal and breath sounds normal. No respiratory distress. She has no wheezes. She has no rales. She exhibits no tenderness.  GI: Soft. She exhibits no mass. There is no tenderness. There is no rebound, no guarding and no CVA tenderness.  Genitourinary: Cervix exhibits no motion tenderness and no discharge. Vaginal discharge (white, creamy) found.  Musculoskeletal: Normal range of motion. She exhibits no edema.  Neurological: She is alert and oriented to person, place, and time. No cranial nerve deficit.  Skin: Skin is warm and dry.  Psychiatric: She has a normal mood and affect.    MAU Course  Procedures  MDM 1215 Consulted with ED physician > reviewed HPI/exam/med hx > discharge home with RX BP meds, does not require inpatient treatment if no neurological symptoms present  Results for orders placed or performed during  the hospital encounter of 03/27/15 (from the past 24 hour(s))  Urinalysis, Routine w reflex microscopic ([facility])     Status: Abnormal   Collection Time: 03/27/15 11:10 AM  Result Value Ref Range   Color, Urine YELLOW YELLOW   APPearance HAZY (A) CLEAR   Specific Gravity, Urine >1.030 (H) 1.005 - 1.030   pH 6.0 5.0 - 8.0   Glucose, UA NEGATIVE NEGATIVE mg/dL   Hgb urine dipstick TRACE (A) NEGATIVE   Bilirubin Urine NEGATIVE NEGATIVE   Ketones, ur 15 (A) NEGATIVE mg/dL   Protein, ur NEGATIVE NEGATIVE mg/dL   Nitrite POSITIVE (A) NEGATIVE   Leukocytes, UA SMALL (A) NEGATIVE  Urine microscopic-add on     Status: Abnormal   Collection Time: 03/27/15 11:10 AM  Result Value Ref Range   Squamous Epithelial / LPF 0-5 (A) NONE SEEN   WBC, UA 6-30 0 - 5 WBC/hpf   RBC / HPF 0-5 0 - 5 RBC/hpf   Bacteria, UA MANY (A) NONE SEEN   Urine-Other MUCOUS PRESENT   Pregnancy, urine POC     Status: None   Collection Time: 03/27/15 11:39 AM  Result Value Ref Range   Preg Test, Ur NEGATIVE NEGATIVE  Wet prep, genital     Status: Abnormal   Collection Time: 03/27/15 12:13 PM  Result Value Ref Range   Yeast Wet Prep HPF POC NONE SEEN NONE SEEN   Trich, Wet Prep NONE SEEN NONE SEEN   Clue Cells Wet Prep HPF POC NONE SEEN NONE SEEN   WBC, Wet Prep HPF POC MANY (A) NONE SEEN   Sperm NONE SEEN     Assessment and Plan  Chronic Hypertension UTI  Plan: Discharge to home HIV pending GC/CT pending RX Norvasc 5 mg i PO qd RX Keflex Urine culture sent Given list of primary care resources in community  Walidah Kennith Gain Karim, [place] 03/27/2015 11:57 AM

## 2015-03-27 NOTE — MAU Note (Signed)
Patient reports spotting after her period on 12/16 after having stopped for 7 days, no spotting today, negative UPT at home, has been having headaches but none today. Vaginal discharge with odor.

## 2015-03-27 NOTE — Discharge Instructions (Signed)
   [facility] [address] [place], [place] [address]        Hours of Operation  Mon - Fri: 9 a.m. - 6 p.m.  Main: [phone]   - [facility]  [address] [place] [address] Ph [phone] Every 2nd Saturday 9am-12pm  [email or web address] FREE Services  - [facility]  [address] [place] [phone]  [address] [place] [address] [place] [phone]  [email or web address] $45 per visit/Walk-in only  - [facility]  [address] [place] [address] Ph [phone]  1st & 3rd Saturday of each month 9:30am-12:30pm [email or web address] Sliding fee scale/Call to make an appointment  - [facility]  [address]  [place] [phone]  Hours Mon-Fri 9am-7pm & Sat 9am-1pm [email or web address] Visits start at $45 per visit/Call to make an appointment  - [facility] [place]  [address] [place]  [address] Ph [phone]  Hours Mon-Wed 8:30am-5pm & Thurs 8:30am-8pm $5 per visit/Call for an eligibility appointment   

## 2015-03-28 LAB — HIV ANTIBODY (ROUTINE TESTING W REFLEX): HIV Screen 4th Generation wRfx: NONREACTIVE

## 2015-03-29 LAB — URINE CULTURE: Culture: >=100000

## 2015-03-30 LAB — GC/CHLAMYDIA PROBE AMP (~~LOC~~) NOT AT ARMC
CHLAMYDIA, DNA PROBE: NEGATIVE
NEISSERIA GONORRHEA: NEGATIVE

## 2015-04-21 ENCOUNTER — Inpatient Hospital Stay (HOSPITAL_COMMUNITY)
Admission: AD | Admit: 2015-04-21 | Discharge: 2015-04-21 | Disposition: A | Discharge status: Home / Self Care | Payer: Medicaid Other | Source: Ambulatory Visit | Attending: Obstetrics & Gynecology | Admitting: Obstetrics & Gynecology

## 2015-04-21 ENCOUNTER — Ambulatory Visit (INDEPENDENT_AMBULATORY_CARE_PROVIDER_SITE_OTHER): Payer: Self-pay

## 2015-04-21 DIAGNOSIS — Z3202 Encounter for pregnancy test, result negative: Secondary | ICD-10-CM | POA: Diagnosis not present

## 2015-04-21 DIAGNOSIS — Z3200 Encounter for pregnancy test, result unknown: Secondary | ICD-10-CM | POA: Diagnosis present

## 2015-04-21 NOTE — Progress Notes (Signed)
Pt notified of negative pregnancy test.  Pt asked why she is not having periods.  I advised pt to make an appointment to speak with a provider.  Pt agreed.

## 2015-04-21 NOTE — MAU Provider Note (Signed)
Patient presents for pregnancy test. LMP 12/16. Had 2 negative pregnancy tests at home last week. Denies abdominal pain or vaginal bleeding.   Pt informed to go to Clinic for pregnancy test when they reopen at 1 pm.   Judeth Horn, NP

## 2015-04-21 NOTE — MAU Note (Signed)
No period this month, lmp 12/16.  Has done 2 preg tests and both were neg.  Denies pain, bleeding or d/c.

## 2015-04-22 LAB — POCT PREGNANCY, URINE: Preg Test, Ur: NEGATIVE

## 2015-04-24 ENCOUNTER — Encounter (HOSPITAL_COMMUNITY): Payer: Self-pay | Admitting: Emergency Medicine

## 2015-04-24 DIAGNOSIS — Z87448 Personal history of other diseases of urinary system: Secondary | ICD-10-CM | POA: Insufficient documentation

## 2015-04-24 DIAGNOSIS — Z8659 Personal history of other mental and behavioral disorders: Secondary | ICD-10-CM | POA: Insufficient documentation

## 2015-04-24 DIAGNOSIS — F17200 Nicotine dependence, unspecified, uncomplicated: Secondary | ICD-10-CM | POA: Insufficient documentation

## 2015-04-24 DIAGNOSIS — Z8619 Personal history of other infectious and parasitic diseases: Secondary | ICD-10-CM | POA: Diagnosis not present

## 2015-04-24 DIAGNOSIS — Z3202 Encounter for pregnancy test, result negative: Secondary | ICD-10-CM | POA: Diagnosis not present

## 2015-04-24 DIAGNOSIS — R42 Dizziness and giddiness: Secondary | ICD-10-CM | POA: Insufficient documentation

## 2015-04-24 DIAGNOSIS — Z792 Long term (current) use of antibiotics: Secondary | ICD-10-CM | POA: Diagnosis not present

## 2015-04-24 DIAGNOSIS — Z79899 Other long term (current) drug therapy: Secondary | ICD-10-CM | POA: Diagnosis not present

## 2015-04-24 LAB — CBC
HCT: 36.8 % (ref 36.0–46.0)
HEMOGLOBIN: 11.8 g/dL — AB (ref 12.0–15.0)
MCH: 27.7 pg (ref 26.0–34.0)
MCHC: 32.1 g/dL (ref 30.0–36.0)
MCV: 86.4 fL (ref 78.0–100.0)
Platelets: 345 10*3/uL (ref 150–400)
RBC: 4.26 MIL/uL (ref 3.87–5.11)
RDW: 13.2 % (ref 11.5–15.5)
WBC: 4.6 10*3/uL (ref 4.0–10.5)

## 2015-04-24 LAB — I-STAT BETA HCG BLOOD, ED (MC, WL, AP ONLY)

## 2015-04-24 NOTE — ED Notes (Signed)
Pt from home for eval of intermittent lightheadedness x3 days, pt states has not had period in almost 1.5 months. Pt denies any n/v or fevers. Pt alert and oriented. nad noted.

## 2015-04-25 ENCOUNTER — Emergency Department (HOSPITAL_COMMUNITY)
Admission: EM | Admit: 2015-04-25 | Discharge: 2015-04-25 | Disposition: A | Discharge status: Home / Self Care | Payer: Medicaid Other | Attending: Emergency Medicine | Admitting: Emergency Medicine

## 2015-04-25 DIAGNOSIS — R42 Dizziness and giddiness: Secondary | ICD-10-CM

## 2015-04-25 LAB — COMPREHENSIVE METABOLIC PANEL
ALBUMIN: 4.1 g/dL (ref 3.5–5.0)
ALT: 13 U/L — AB (ref 14–54)
AST: 17 U/L (ref 15–41)
Alkaline Phosphatase: 49 U/L (ref 38–126)
Anion gap: 10 (ref 5–15)
BUN: 11 mg/dL (ref 6–20)
CHLORIDE: 103 mmol/L (ref 101–111)
CO2: 28 mmol/L (ref 22–32)
Calcium: 9.3 mg/dL (ref 8.9–10.3)
Creatinine, Ser: 0.83 mg/dL (ref 0.44–1.00)
GFR calc Af Amer: >60 mL/min (ref >60)
GFR calc non Af Amer: >60 mL/min (ref >60)
Glucose, Bld: 85 mg/dL (ref 65–99)
POTASSIUM: 3.8 mmol/L (ref 3.5–5.1)
Sodium: 141 mmol/L (ref 135–145)
TOTAL PROTEIN: 7.3 g/dL (ref 6.5–8.1)
Total Bilirubin: 0.8 mg/dL (ref 0.3–1.2)

## 2015-04-25 LAB — URINALYSIS, ROUTINE W REFLEX MICROSCOPIC
Bilirubin Urine: NEGATIVE
GLUCOSE, UA: NEGATIVE mg/dL
Ketones, ur: NEGATIVE mg/dL
Nitrite: NEGATIVE
PH: 6 (ref 5.0–8.0)
PROTEIN: NEGATIVE mg/dL
Specific Gravity, Urine: 1.027 (ref 1.005–1.030)

## 2015-04-25 LAB — URINE MICROSCOPIC-ADD ON

## 2015-04-25 MED ORDER — FOSFOMYCIN TROMETHAMINE 3 G PO PACK
3.0000 g | PACK | Freq: Once | ORAL | Status: AC
  Administered 2015-04-25: 3 g via ORAL
  Filled 2015-04-25: qty 3

## 2015-04-25 NOTE — ED Notes (Signed)
Pt given graham crackers and peanut butter with ginger ale to eat and drink.

## 2015-04-25 NOTE — Discharge Instructions (Signed)
Dizziness Ms. Spain, your blood work today is normal and you are not pregnant.  If you do not have a cycle in 1 week, take another home pregnancy test.  See a primary care doctor within 3 days for close follow up. If symptoms worsen, come back to the ED immediately.  Thank you. Dizziness is a common problem. It makes you feel unsteady or lightheaded. You may feel like you are about to pass out (faint). Dizziness can lead to injury if you stumble or fall. Anyone can get dizzy, but dizziness is more common in older adults. This condition can be caused by a number of things, including:  Medicines.  Dehydration.  Illness. HOME CARE Following these instructions may help with your condition: Eating and Drinking  Drink enough fluid to keep your pee (urine) clear or pale yellow. This helps to keep you from getting dehydrated. Try to drink more clear fluids, such as water.  Do not drink alcohol.  Limit how much caffeine you drink or eat if told by your doctor.  Limit how much salt you drink or eat if told by your doctor. Activity  Avoid making quick movements.  When you stand up from sitting in a chair, steady yourself until you feel okay.  In the morning, first sit up on the side of the bed. When you feel okay, stand slowly while you hold onto something. Do this until you know that your balance is fine.  Move your legs often if you need to stand in one place for a long time. Tighten and relax your muscles in your legs while you are standing.  Do not drive or use heavy machinery if you feel dizzy.  Avoid bending down if you feel dizzy. Place items in your home so that they are easy for you to reach without leaning over. Lifestyle  Do not use any tobacco products, including cigarettes, chewing tobacco, or electronic cigarettes. If you need help quitting, ask your doctor.  Try to lower your stress level, such as with yoga or meditation. Talk with your doctor if you need help. General  Instructions  Watch your dizziness for any changes.  Take medicines only as told by your doctor. Talk with your doctor if you think that your dizziness is caused by a medicine that you are taking.  Tell a friend or a family member that you are feeling dizzy. If he or she notices any changes in your behavior, have this person call your doctor.  Keep all follow-up visits as told by your doctor. This is important. GET HELP IF:  Your dizziness does not go away.  Your dizziness or light-headedness gets worse.  You feel sick to your stomach (nauseous).  You have trouble hearing.  You have new symptoms.  You are unsteady on your feet or you feel like the room is spinning. GET HELP RIGHT AWAY IF:  You throw up (vomit) or have diarrhea and are unable to eat or drink anything.  You have trouble:  Talking.  Walking.  Swallowing.  Using your arms, hands, or legs.  You feel generally weak.  You are not thinking clearly or you have trouble forming sentences. It may take a friend or family member to notice this.  You have:  Chest pain.  Pain in your belly (abdomen).  Shortness of breath.  Sweating.  Your vision changes.  You are bleeding.  You have a headache.  You have neck pain or a stiff neck.  You have a fever.  This information is not intended to replace advice given to you by your health care provider. Make sure you discuss any questions you have with your health care provider.   Document Released: 02/24/2011 Document Revised: 07/22/2014 Document Reviewed: 03/03/2014 Elsevier Interactive Patient Education [employer].

## 2015-04-25 NOTE — ED Provider Notes (Signed)
CSN: [id number]     Arrival date & time 04/24/15  2305 History  By signing my name below, I, Emmanuella Mensah, attest that this documentation has been prepared under the direction and in the presence of Tomasita Crumble, MD. Electronically Signed: Angelene Giovanni, ED Scribe. 04/25/2015. 1:53 AM.    Chief Complaint  Patient presents with  . Dizziness  . Possible Pregnancy   The history is provided by the patient. No language interpreter was used.   HPI Comments: Sharon Johnston is a 25 y.o. female who presents to the Emergency Department complaining of an ongoing episode of not having her menstrual period for approx. one month and a week. She reports associated intermittent lightheadedness onset 2 days ago. She states that she has been eating adequately but drinking less water. No alleviating factors noted. Pt has not taken any medications PTA. She denies a hx of abnormal menstrual cycles. She denies any vaginal bleeding, vaginal discharge, urinary frequency, dysuria, CP, or SOB.   Past Medical History  Diagnosis Date  . Depression     hospitalized for 2 weeks after suicide att  . Hx of gonorrhea   . Hypertension     gestational HTN  . Kidney infection   . Chlamydia    Past Surgical History  Procedure Laterality Date  . No past surgeries     Family History  Problem Relation Age of Onset  . Hypertension Mother   . Hypertension Maternal Aunt   . Hypertension Maternal Grandmother   . Depression Cousin    Social History  Substance Use Topics  . Smoking status: Current Every Day Smoker -- 0.25 packs/day  . Smokeless tobacco: Never Used  . Alcohol Use: No   OB History    Gravida Para Term Preterm AB TAB SAB Ectopic Multiple Living   0 1 0 1 0 0 3     Review of Systems  Respiratory: Negative for shortness of breath.   Cardiovascular: Negative for chest pain.  Genitourinary: Negative for dysuria, frequency, vaginal bleeding and vaginal discharge.  Neurological: Positive for  light-headedness.  All other systems reviewed and are negative.     Allergies  Review of patient's allergies indicates no known allergies.  Home Medications   Prior to Admission medications   Medication Sig Start Date End Date Taking? Authorizing Provider  amLODipine (NORVASC) 5 MG tablet Take 1 tablet (5 mg total) by mouth daily. 03/27/15   Marlis Edelson, CNM  cephALEXin (KEFLEX) 500 MG capsule Take 1 capsule (500 mg total) by mouth 2 (two) times daily. 03/27/15   Marlis Edelson, CNM   BP 139/77 mmHg  Pulse 76  Temp(Src) 97 F (36.1 C) (Oral)  Resp 16  Ht  (1.575 m)  Wt 123 lb (55.792 kg)  BMI 22.49 kg/m2  SpO2 100%  LMP 03/06/2015 Physical Exam  Constitutional: She is oriented to person, place, and time. She appears well-developed and well-nourished. No distress.  HENT:  Head: Normocephalic and atraumatic.  Nose: Nose normal.  Mouth/Throat: Oropharynx is clear and moist. No oropharyngeal exudate.  Eyes: Conjunctivae and EOM are normal. Pupils are equal, round, and reactive to light. No scleral icterus.  Neck: Normal range of motion. Neck supple. No JVD present. No tracheal deviation present. No thyromegaly present.  Cardiovascular: Normal rate, regular rhythm and normal heart sounds.  Exam reveals no gallop and no friction rub.   No murmur heard. Pulmonary/Chest: Effort normal and breath sounds normal. No respiratory distress. She  has no wheezes. She exhibits no tenderness.  Abdominal: Soft. Bowel sounds are normal. She exhibits no distension and no mass. There is no tenderness. There is no rebound and no guarding.  Musculoskeletal: Normal range of motion. She exhibits no edema or tenderness.  Lymphadenopathy:    She has no cervical adenopathy.  Neurological: She is alert and oriented to person, place, and time. No cranial nerve deficit. She exhibits normal muscle tone.  Normal strength and sensation in all extremities. Normal cerebellar testing.  Skin: Skin is warm  and dry. No rash noted. No erythema. No pallor.  Nursing note and vitals reviewed.   ED Course  Procedures (including critical care time) DIAGNOSTIC STUDIES: Oxygen Saturation is 100% on RA, normal by my interpretation.    COORDINATION OF CARE: 1:38 AM- Pt advised of plan for treatment and pt agrees. Pt informed of pregnancy test results.    Labs Review Labs Reviewed  COMPREHENSIVE METABOLIC PANEL - Abnormal; Notable for the following:    ALT 13 (*)    All other components within normal limits  CBC - Abnormal; Notable for the following:    Hemoglobin 11.8 (*)    All other components within normal limits  URINALYSIS, ROUTINE W REFLEX MICROSCOPIC (NOT AT [facility]) - Abnormal; Notable for the following:    APPearance CLOUDY (*)    Hgb urine dipstick TRACE (*)    Leukocytes, UA SMALL (*)    All other components within normal limits  URINE MICROSCOPIC-ADD ON - Abnormal; Notable for the following:    Squamous Epithelial / LPF 6-30 (*)    Bacteria, UA FEW (*)    All other components within normal limits  I-STAT BETA HCG BLOOD, ED ([facility], [facility], [facility] ONLY)    Tomasita Crumble, MD has personally reviewed and evaluated these lab results as part of his medical decision-making.   EKG Interpretation None      MDM   Final diagnoses:  None   patient presents emergency department for intermittent lightheadedness and not having her menstrual cycle. She recently had unprotected sex 3 days ago. Patient is here is negative however patient advised to repeat this test in one week. She converses good understanding. We'll give single dose of fosfomycin for possible UTI. EKG does not reveal any cause for lightheadedness. She appears well and in no acute distress, vital signs were within her normal limits and she is safe for discharge.   I personally performed the services described in this documentation, which was scribed in my presence. The recorded information has been reviewed and is accurate.     Tomasita Crumble, MD 04/25/15 0157

## 2015-04-25 NOTE — ED Notes (Signed)
Pt verbalized understanding to follow up and establish pcp. Pt stable and NAD.

## 2015-05-11 ENCOUNTER — Ambulatory Visit: Payer: Self-pay | Admitting: Obstetrics & Gynecology

## 2015-06-09 ENCOUNTER — Emergency Department (HOSPITAL_COMMUNITY)
Admission: EM | Admit: 2015-06-09 | Discharge: 2015-06-10 | Discharge status: Against Medical Advice | Payer: Medicaid Other

## 2015-06-09 ENCOUNTER — Encounter (HOSPITAL_COMMUNITY): Payer: Self-pay | Admitting: Emergency Medicine

## 2015-06-09 NOTE — ED Notes (Addendum)
Pt states that she feels depressed especially now during her menstrual cycle. Denies SI/HI. Alert and oriented.

## 2015-06-10 NOTE — ED Notes (Signed)
Called to [facility] by patients father, father was very upset regarding wait times. Apologies were offered, explanations given for wait and why other patients could be seen coming and going. He continued to be very upset because his daughter came in by ambulance, i attempted to explain to father a room is not guaranteed to EMS patients. Father then complained about the attitude of registration. I explained to father she would be taken to a room as one becomes available.

## 2015-08-30 IMAGING — US US OB TRANSVAGINAL
1 series · 14 of 23 positions shown · non-contrast
Comparison: 12/12/2012

CLINICAL DATA: Assess viability. By unsure LMP, the patient is 6
weeks 2 days. EDC by unsure LMP would be 08/14/2013.

EXAM:
TRANSVAGINAL OB ULTRASOUND
TECHNIQUE: Transvaginal ultrasound was performed for complete evaluation of the
gestation as well as the maternal uterus, adnexal regions, and
pelvic cul-de-sac.

[Series 1: us ob transvaginal · 23 acquisitions, 14 frames shown]
[im 1/23]
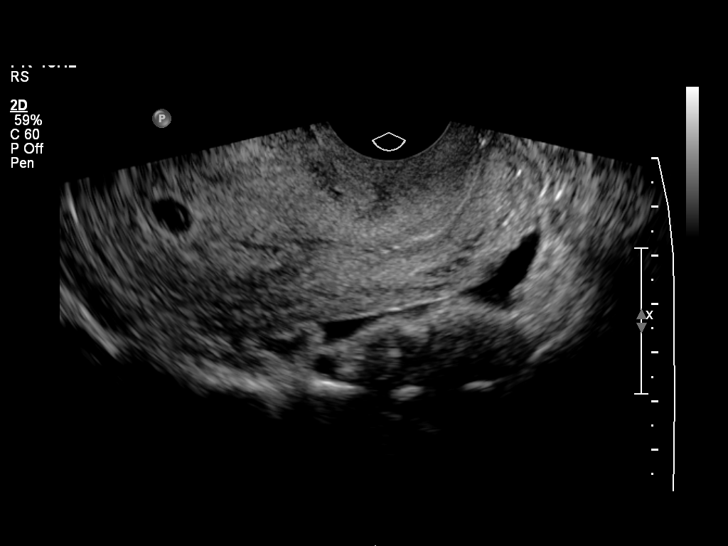
[im 3/23]
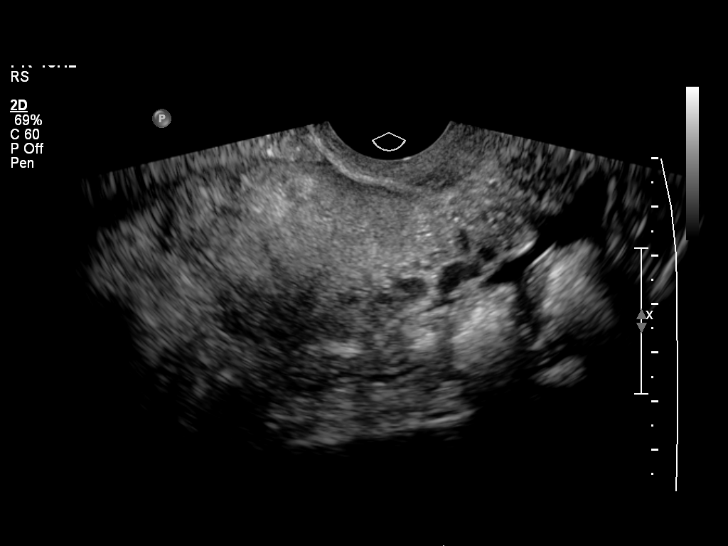
[im 5/23]
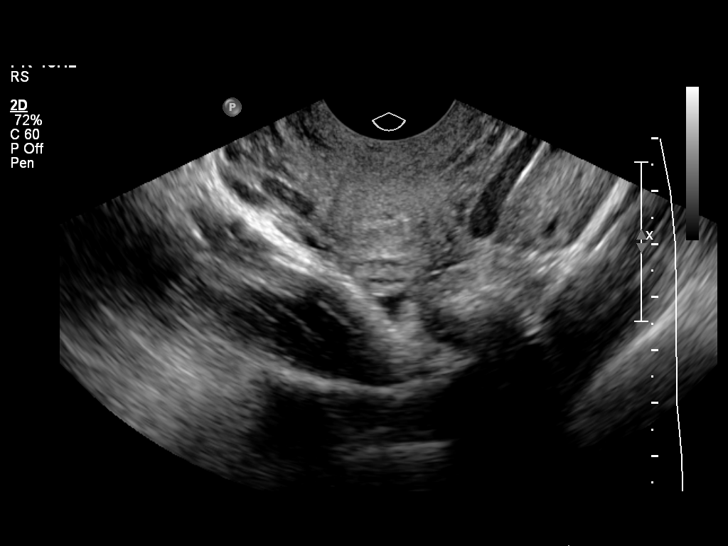
[im 6/23]
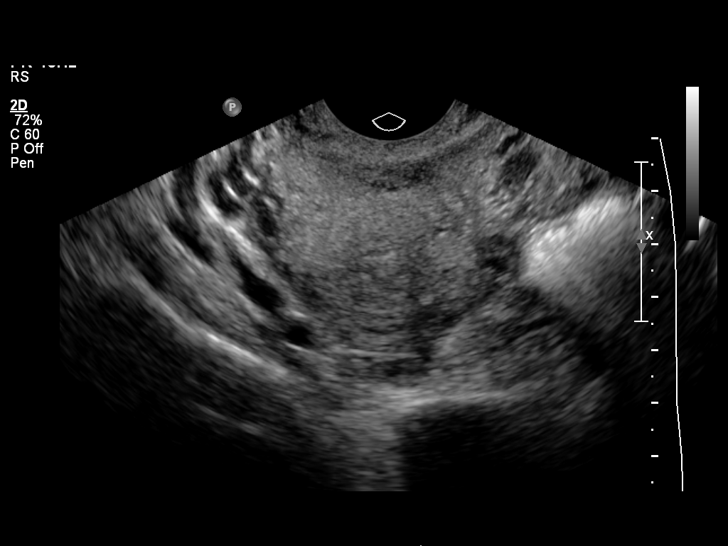
[im 8/23]
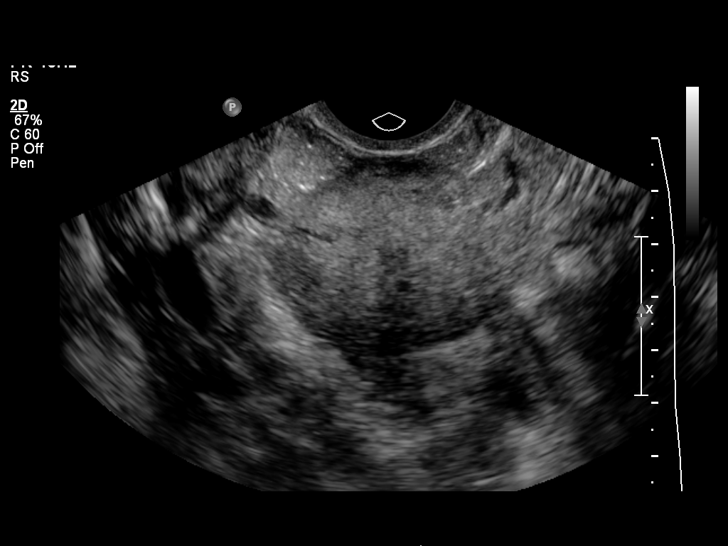
[im 10/23]
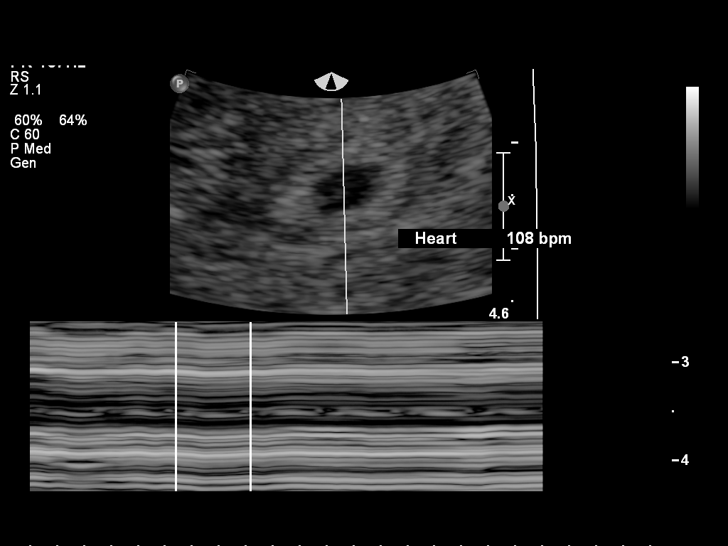
[im 11/23]
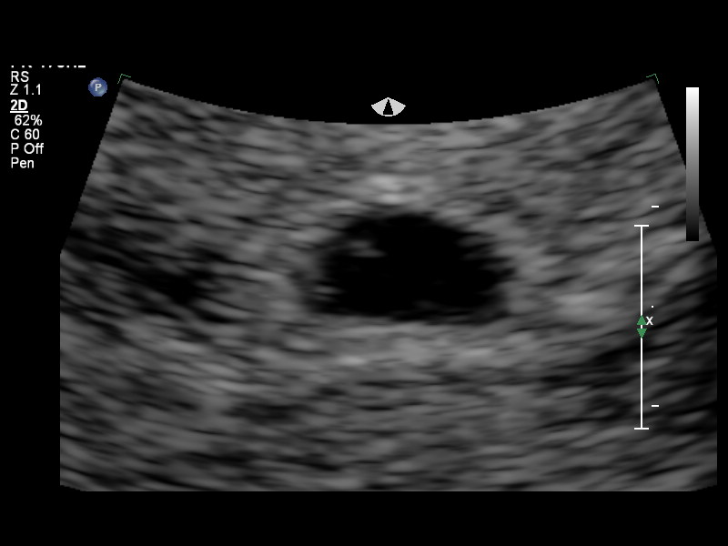
[im 13/23]
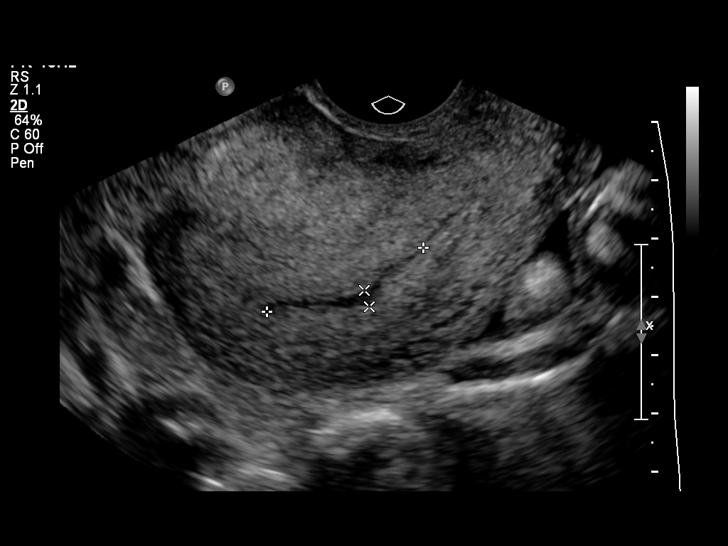
[im 14/23]
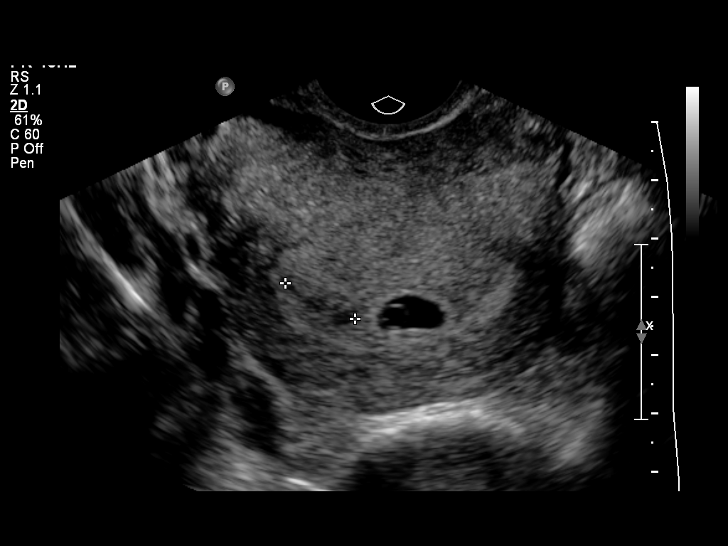
[im 16/23]
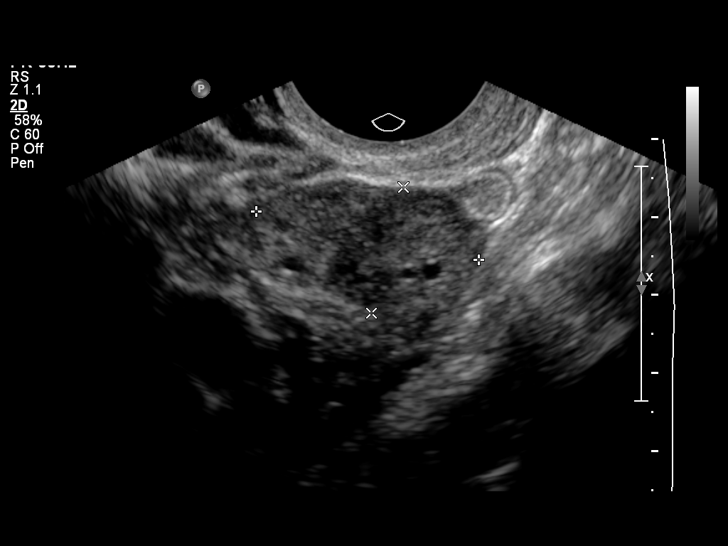
[im 18/23]
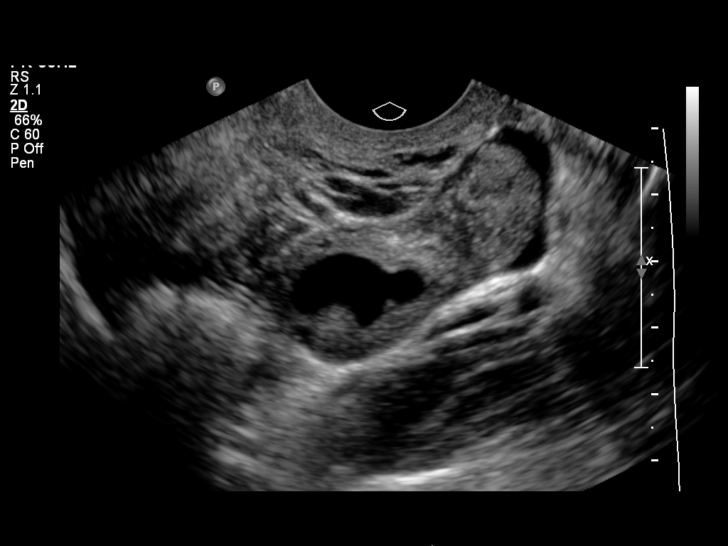
[im 19/23]
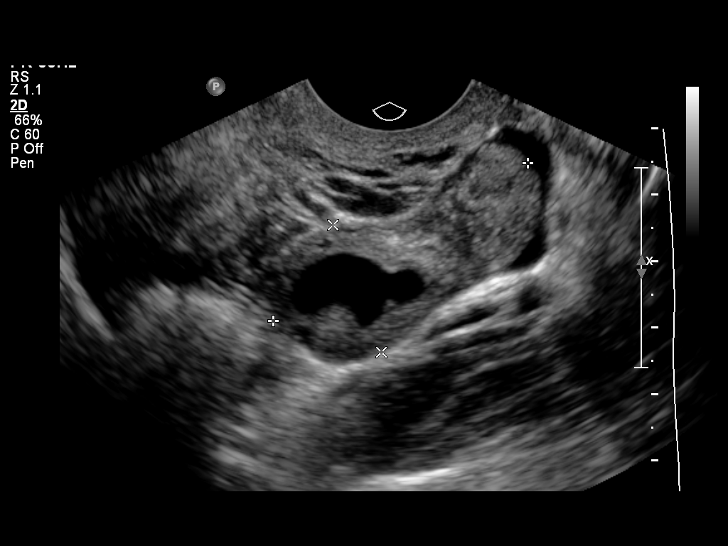
[im 21/23]
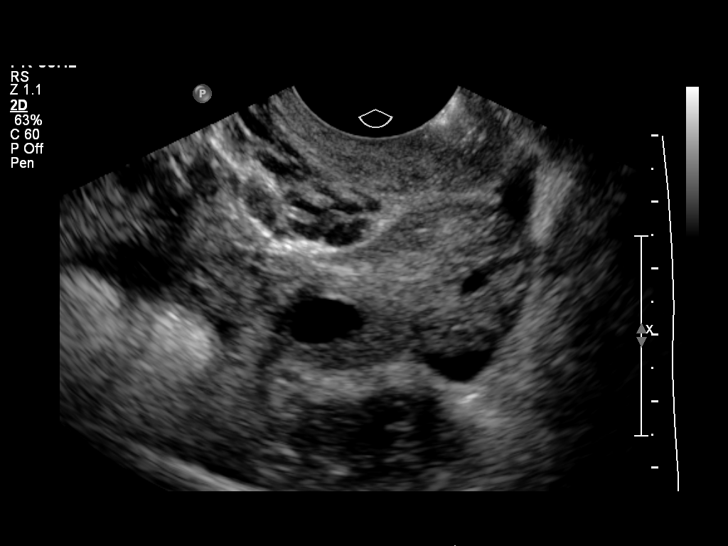
[im 23/23]
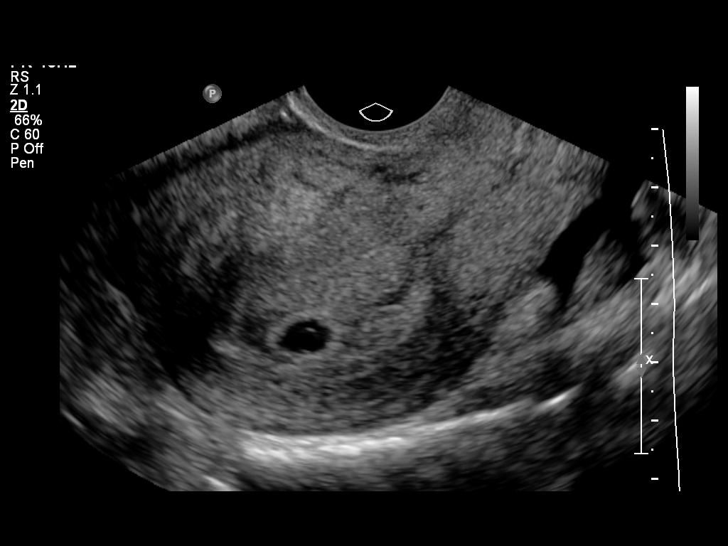

[14 of 23 positions shown; findings below may reference images not displayed]

FINDINGS: Intrauterine gestational sac: Present

Yolk sac:  Present

Embryo:  Present

Cardiac Activity: Present

Heart Rate: 108 bpm

CRL:   2.5  mm   5 w 6 d                  US EDC: 08/17/2013

Maternal uterus/adnexae: Small subchorionic hemorrhage is present.
The ovaries have a normal appearance. Left corpus luteum cyst is
present. Trace free pelvic fluid is noted.
IMPRESSION: 1. Single living intrauterine embryo corresponding to age of 5 weeks
6 days.
2. By today's ultrasound, EDC is 08/17/2013.

## 2015-10-30 ENCOUNTER — Encounter (HOSPITAL_COMMUNITY): Payer: Self-pay | Admitting: *Deleted

## 2015-10-30 ENCOUNTER — Inpatient Hospital Stay (HOSPITAL_COMMUNITY)
Admission: AD | Admit: 2015-10-30 | Discharge: 2015-10-30 | Disposition: A | Discharge status: Home / Self Care | Payer: Medicaid Other | Source: Ambulatory Visit | Attending: Family Medicine | Admitting: Family Medicine

## 2015-10-30 DIAGNOSIS — F17210 Nicotine dependence, cigarettes, uncomplicated: Secondary | ICD-10-CM | POA: Insufficient documentation

## 2015-10-30 DIAGNOSIS — N912 Amenorrhea, unspecified: Secondary | ICD-10-CM | POA: Diagnosis not present

## 2015-10-30 DIAGNOSIS — R51 Headache: Secondary | ICD-10-CM | POA: Insufficient documentation

## 2015-10-30 DIAGNOSIS — F329 Major depressive disorder, single episode, unspecified: Secondary | ICD-10-CM | POA: Insufficient documentation

## 2015-10-30 DIAGNOSIS — O99341 Other mental disorders complicating pregnancy, first trimester: Secondary | ICD-10-CM | POA: Diagnosis not present

## 2015-10-30 DIAGNOSIS — O10911 Unspecified pre-existing hypertension complicating pregnancy, first trimester: Secondary | ICD-10-CM | POA: Insufficient documentation

## 2015-10-30 DIAGNOSIS — Z3A01 Less than 8 weeks gestation of pregnancy: Secondary | ICD-10-CM | POA: Insufficient documentation

## 2015-10-30 DIAGNOSIS — I10 Essential (primary) hypertension: Secondary | ICD-10-CM | POA: Diagnosis present

## 2015-10-30 DIAGNOSIS — O26891 Other specified pregnancy related conditions, first trimester: Secondary | ICD-10-CM | POA: Diagnosis not present

## 2015-10-30 DIAGNOSIS — G44229 Chronic tension-type headache, not intractable: Secondary | ICD-10-CM

## 2015-10-30 LAB — URINALYSIS, ROUTINE W REFLEX MICROSCOPIC
Bilirubin Urine: NEGATIVE
GLUCOSE, UA: NEGATIVE mg/dL
Ketones, ur: NEGATIVE mg/dL
Leukocytes, UA: NEGATIVE
Nitrite: NEGATIVE
Protein, ur: NEGATIVE mg/dL
Specific Gravity, Urine: 1.02 (ref 1.005–1.030)
pH: 6.5 (ref 5.0–8.0)

## 2015-10-30 LAB — URINE MICROSCOPIC-ADD ON

## 2015-10-30 LAB — POCT PREGNANCY, URINE: PREG TEST UR: POSITIVE — AB

## 2015-10-30 MED ORDER — AMLODIPINE BESYLATE 5 MG PO TABS: 5.0000 mg | ORAL_TABLET | Freq: Every day | ORAL | 0 refills | Status: DC

## 2015-10-30 NOTE — MAU Provider Note (Signed)
Chief Complaint: Possible Pregnancy   First Provider Initiated Contact with Patient 10/30/15 2049        SUBJECTIVE HPI Sharon Johnston is a 25 y.o. [phone] at Unknown by LMP who presents to maternity admissions reporting headaches, no energy and is worried she is pregnant.  Has not been using any contraception, but does not want to be pregnant.  Unsure dates.  Declines STD testing.  Does not want anything for headache now. Wants pregnancy test.  She denies vaginal bleeding, vaginal itching/burning, urinary symptoms, h/a, dizziness, n/v, or fever/chills.    RN Note: LMP 09/19/15. Having bad headaches, no energy and think I may be pregnant. Has not done upt. Some white d/c. No pain except for headache. L breast itches around nipple   Past Medical History:  Diagnosis Date  . Chlamydia   . Depression    hospitalized for 2 weeks after suicide att  . Hx of gonorrhea   . Hypertension    gestational HTN  . Kidney infection    Past Surgical History:  Procedure Laterality Date  . NO PAST SURGERIES     Social History   Social History  . Marital status: Single    Spouse name: N/A  . Number of children: N/A  . Years of education: N/A   Occupational History  . Not on file.   Social History Main Topics  . Smoking status: Current Every Day Smoker    Packs/day: 0.25  . Smokeless tobacco: Never Used  . Alcohol use No  . Drug use: No  . Sexual activity: Yes    Birth control/ protection: None   Other Topics Concern  . Not on file   Social History Narrative  . No narrative on file   No current facility-administered medications on file prior to encounter.    Current Outpatient Prescriptions on File Prior to Encounter  Medication Sig Dispense Refill  . amLODipine (NORVASC) 5 MG tablet Take 1 tablet (5 mg total) by mouth daily. (Patient not taking: Reported on 10/30/2015) 30 tablet 12   No Known Allergies  I have reviewed patient's Past Medical Hx, Surgical Hx, Family Hx, Social  Hx, medications and allergies.   ROS:  Review of Systems   Constitutional: Negative for fever and chills. Has no energy Gastrointestinal: Negative for nausea, vomiting, abdominal pain, diarrhea and constipation.   Genitourinary: Negative for dysuria. negative for bleeding   Missed menses Musculoskeletal: Negative for back pain.  Neurological: Negative for dizziness and weakness. Daily headaches.   Other systems negative  Physical Exam  Patient Vitals for the past 24 hrs:  BP Temp Pulse Resp Height Weight  10/30/15 2011 137/89 98.3 F (36.8 C) 75 18 5\' 3"  (1.6 m) 123 lb 9.6 oz (56.1 kg)   Vitals:   10/30/15 2011 10/30/15 2111 10/30/15 2112  BP: 137/89 (!) 151/110 154/98  Pulse: 75 78 65  Resp: 18  18  Temp: 98.3 F (36.8 C)  98.6 F (37 C)  TempSrc:   Oral  Weight: 123 lb 9.6 oz (56.1 kg)    Height: 5\' 3"  (1.6 m)      Physical Exam  Constitutional: Well-developed, well-nourished female in no acute distress.  Cardiovascular: normal rate Respiratory: normal effort GI: Abd soft, non-tender. Pos BS x 4 MS: Extremities nontender, no edema, normal ROM Neurologic: Alert and oriented x 4.  GU: Neg CVAT.  PELVIC EXAM: Declines    LAB RESULTS Results for orders placed or performed during the hospital encounter of 10/30/15 (  from the past 24 hour(s))  Pregnancy, urine POC     Status: Abnormal   Collection Time: 10/30/15  8:32 PM  Result Value Ref Range   Preg Test, Ur POSITIVE (A) NEGATIVE       IMAGING Bedside US done Possible early gestational sac seen, no visible embryo yet Suspect 4-5 week pregnancy  MAU Management/MDM: Early pregnancy workup not indicated in absence of bleeding or pain Declines headache med, not really hurting now Declines STD testing  Discussed elevated BPs Was on Norvasc after last pregnancy 2016, but stopped taking it when it ran out Consulted Dr Shawnie Pons who recommends she restart her Norvasc   ASSESSMENT Pregnancy at Unknown [place],  probably 4-5 weeks Headaches, likely due to hypertension Hypertension, chronic   PLAN Discharge home Rx sent for Norvasc  qd  Advised to come in soon for prenatal care, may want to come back to our clinic Message sent to clinic Pregnancy episode created [facility] box added to problem list  Pt stable at time of discharge. Encouraged to return here or to other Urgent Care/ED if she develops worsening of symptoms, increase in pain, fever, or other concerning symptoms.    Wynelle Bourgeois CNM, MSN Certified Nurse-Midwife 10/30/2015  9:07 PM

## 2015-10-30 NOTE — Discharge Instructions (Signed)
First Trimester of Pregnancy The first trimester of pregnancy is from week 1 until the end of week 12 (months 1 through 3). A week after a sperm fertilizes an egg, the egg will implant on the wall of the uterus. This embryo will begin to develop into a baby. Genes from you and your partner are forming the baby. The female genes determine whether the baby is a boy or a girl. At 6-8 weeks, the eyes and face are formed, and the heartbeat can be seen on ultrasound. At the end of 12 weeks, all the baby's organs are formed.  Now that you are pregnant, you will want to do everything you can to have a healthy baby. Two of the most important things are to get good prenatal care and to follow your health care provider's instructions. Prenatal care is all the medical care you receive before the baby's birth. This care will help prevent, find, and treat any problems during the pregnancy and childbirth. BODY CHANGES Your body goes through many changes during pregnancy. The changes vary from woman to woman.   You may gain or lose a couple of pounds at first.  You may feel sick to your stomach (nauseous) and throw up (vomit). If the vomiting is uncontrollable, call your health care provider.  You may tire easily.  You may develop headaches that can be relieved by medicines approved by your health care provider.  You may urinate more often. Painful urination may mean you have a bladder infection.  You may develop heartburn as a result of your pregnancy.  You may develop constipation because certain hormones are causing the muscles that push waste through your intestines to slow down.  You may develop hemorrhoids or swollen, bulging veins (varicose veins).  Your breasts may begin to grow larger and become tender. Your nipples may stick out more, and the tissue that surrounds them (areola) may become darker.  Your gums may bleed and may be sensitive to brushing and flossing.  Dark spots or blotches (chloasma,  mask of pregnancy) may develop on your face. This will likely fade after the baby is born.  Your menstrual periods will stop.  You may have a loss of appetite.  You may develop cravings for certain kinds of food.  You may have changes in your emotions from day to day, such as being excited to be pregnant or being concerned that something may go wrong with the pregnancy and baby.  You may have more vivid and strange dreams.  You may have changes in your hair. These can include thickening of your hair, rapid growth, and changes in texture. Some women also have hair loss during or after pregnancy, or hair that feels dry or thin. Your hair will most likely return to normal after your baby is born. WHAT TO EXPECT AT YOUR PRENATAL VISITS During a routine prenatal visit:  You will be weighed to make sure you and the baby are growing normally.  Your blood pressure will be taken.  Your abdomen will be measured to track your baby's growth.  The fetal heartbeat will be listened to starting around week 10 or 12 of your pregnancy.  Test results from any previous visits will be discussed. Your health care provider may ask you:  How you are feeling.  If you are feeling the baby move.  If you have had any abnormal symptoms, such as leaking fluid, bleeding, severe headaches, or abdominal cramping.  If you are using any tobacco products,   including cigarettes, chewing tobacco, and electronic cigarettes.  If you have any questions. Other tests that may be performed during your first trimester include:  Blood tests to find your blood type and to check for the presence of any previous infections. They will also be used to check for low iron levels (anemia) and Rh antibodies. Later in the pregnancy, blood tests for diabetes will be done along with other tests if problems develop.  Urine tests to check for infections, diabetes, or protein in the urine.  An ultrasound to confirm the proper growth  and development of the baby.  An amniocentesis to check for possible genetic problems.  Fetal screens for spina bifida and Down syndrome.  You may need other tests to make sure you and the baby are doing well.  HIV (human immunodeficiency virus) testing. Routine prenatal testing includes screening for HIV, unless you choose not to have this test. HOME CARE INSTRUCTIONS  Medicines  Follow your health care provider's instructions regarding medicine use. Specific medicines may be either safe or unsafe to take during pregnancy.  Take your prenatal vitamins as directed.  If you develop constipation, try taking a stool softener if your health care provider approves. Diet  Eat regular, well-balanced meals. Choose a variety of foods, such as meat or vegetable-based protein, fish, milk and low-fat dairy products, vegetables, fruits, and whole grain breads and cereals. Your health care provider will help you determine the amount of weight gain that is right for you.  Avoid raw meat and uncooked cheese. These carry germs that can cause birth defects in the baby.  Eating four or five small meals rather than three large meals a day may help relieve nausea and vomiting. If you start to feel nauseous, eating a few soda crackers can be helpful. Drinking liquids between meals instead of during meals also seems to help nausea and vomiting.  If you develop constipation, eat more high-fiber foods, such as fresh vegetables or fruit and whole grains. Drink enough fluids to keep your urine clear or pale yellow. Activity and Exercise  Exercise only as directed by your health care provider. Exercising will help you:  Control your weight.  Stay in shape.  Be prepared for labor and delivery.  Experiencing pain or cramping in the lower abdomen or low back is a good sign that you should stop exercising. Check with your health care provider before continuing normal exercises.  Try to avoid standing for long  periods of time. Move your legs often if you must stand in one place for a long time.  Avoid heavy lifting.  Wear low-heeled shoes, and practice good posture.  You may continue to have sex unless your health care provider directs you otherwise. Relief of Pain or Discomfort  Wear a good support bra for breast tenderness.   Take warm sitz baths to soothe any pain or discomfort caused by hemorrhoids. Use hemorrhoid cream if your health care provider approves.   Rest with your legs elevated if you have leg cramps or low back pain.  If you develop varicose veins in your legs, wear support hose. Elevate your feet for 15 minutes, 3-4 times a day. Limit salt in your diet. Prenatal Care  Schedule your prenatal visits by the twelfth week of pregnancy. They are usually scheduled monthly at first, then more often in the last 2 months before delivery.  Write down your questions. Take them to your prenatal visits.  Keep all your prenatal visits as directed by your   health care provider. Safety  Wear your seat belt at all times when driving.  Make a list of emergency phone numbers, including numbers for family, friends, the hospital, and police and fire departments. General Tips  Ask your health care provider for a referral to a local prenatal education class. Begin classes no later than at the beginning of month 6 of your pregnancy.  Ask for help if you have counseling or nutritional needs during pregnancy. Your health care provider can offer advice or refer you to specialists for help with various needs.  Do not use hot tubs, steam rooms, or saunas.  Do not douche or use tampons or scented sanitary pads.  Do not cross your legs for long periods of time.  Avoid cat litter boxes and soil used by cats. These carry germs that can cause birth defects in the baby and possibly loss of the fetus by miscarriage or stillbirth.  Avoid all smoking, herbs, alcohol, and medicines not prescribed by  your health care provider. Chemicals in these affect the formation and growth of the baby.  Do not use any tobacco products, including cigarettes, chewing tobacco, and electronic cigarettes. If you need help quitting, ask your health care provider. You may receive counseling support and other resources to help you quit.  Schedule a dentist appointment. At home, brush your teeth with a soft toothbrush and be gentle when you floss. SEEK MEDICAL CARE IF:   You have dizziness.  You have mild pelvic cramps, pelvic pressure, or nagging pain in the abdominal area.  You have persistent nausea, vomiting, or diarrhea.  You have a bad smelling vaginal discharge.  You have pain with urination.  You notice increased swelling in your face, hands, legs, or ankles. SEEK IMMEDIATE MEDICAL CARE IF:   You have a fever.  You are leaking fluid from your vagina.  You have spotting or bleeding from your vagina.  You have severe abdominal cramping or pain.  You have rapid weight gain or loss.  You vomit blood or material that looks like coffee grounds.  You are exposed to German measles and have never had them.  You are exposed to fifth disease or chickenpox.  You develop a severe headache.  You have shortness of breath.  You have any kind of trauma, such as from a fall or a car accident.   This information is not intended to replace advice given to you by your health care provider. Make sure you discuss any questions you have with your health care provider.   Document Released: 03/01/2001 Document Revised: 03/28/2014 Document Reviewed: 01/15/2013 Elsevier Interactive Patient Education 2016 Else[employer] Inc[employer]  

## 2015-10-30 NOTE — MAU Note (Addendum)
LMP 09/19/15. Having bad headaches, no energy and think I may be pregnant. Has not done upt. Some white d/c. No pain except for headache. L breast itches around nipple

## 2015-10-31 ENCOUNTER — Encounter (HOSPITAL_COMMUNITY): Payer: Self-pay | Admitting: Advanced Practice Midwife

## 2015-10-31 DIAGNOSIS — I10 Essential (primary) hypertension: Secondary | ICD-10-CM | POA: Diagnosis present

## 2015-11-10 ENCOUNTER — Encounter: Payer: Self-pay | Admitting: Obstetrics & Gynecology

## 2015-11-13 ENCOUNTER — Ambulatory Visit (HOSPITAL_COMMUNITY): Payer: Medicaid Other | Attending: Advanced Practice Midwife

## 2015-11-29 ENCOUNTER — Encounter (HOSPITAL_COMMUNITY): Payer: Self-pay

## 2015-11-29 ENCOUNTER — Inpatient Hospital Stay (HOSPITAL_COMMUNITY): Payer: Medicaid Other

## 2015-11-29 ENCOUNTER — Inpatient Hospital Stay (HOSPITAL_COMMUNITY)
Admission: AD | Admit: 2015-11-29 | Discharge: 2015-11-29 | Disposition: A | Discharge status: Home / Self Care | Payer: Medicaid Other | Source: Ambulatory Visit | Attending: Obstetrics and Gynecology | Admitting: Obstetrics and Gynecology

## 2015-11-29 DIAGNOSIS — O2341 Unspecified infection of urinary tract in pregnancy, first trimester: Secondary | ICD-10-CM | POA: Diagnosis not present

## 2015-11-29 DIAGNOSIS — R51 Headache: Secondary | ICD-10-CM | POA: Diagnosis not present

## 2015-11-29 DIAGNOSIS — R300 Dysuria: Secondary | ICD-10-CM | POA: Insufficient documentation

## 2015-11-29 DIAGNOSIS — O99341 Other mental disorders complicating pregnancy, first trimester: Secondary | ICD-10-CM | POA: Insufficient documentation

## 2015-11-29 DIAGNOSIS — B9689 Other specified bacterial agents as the cause of diseases classified elsewhere: Secondary | ICD-10-CM

## 2015-11-29 DIAGNOSIS — Z3A09 9 weeks gestation of pregnancy: Secondary | ICD-10-CM | POA: Diagnosis not present

## 2015-11-29 DIAGNOSIS — O26891 Other specified pregnancy related conditions, first trimester: Secondary | ICD-10-CM | POA: Diagnosis not present

## 2015-11-29 DIAGNOSIS — N760 Acute vaginitis: Secondary | ICD-10-CM | POA: Diagnosis not present

## 2015-11-29 DIAGNOSIS — O23591 Infection of other part of genital tract in pregnancy, first trimester: Secondary | ICD-10-CM | POA: Diagnosis not present

## 2015-11-29 DIAGNOSIS — O26899 Other specified pregnancy related conditions, unspecified trimester: Secondary | ICD-10-CM

## 2015-11-29 DIAGNOSIS — O99331 Smoking (tobacco) complicating pregnancy, first trimester: Secondary | ICD-10-CM | POA: Diagnosis not present

## 2015-11-29 DIAGNOSIS — R109 Unspecified abdominal pain: Secondary | ICD-10-CM | POA: Insufficient documentation

## 2015-11-29 LAB — URINALYSIS, ROUTINE W REFLEX MICROSCOPIC
Bilirubin Urine: NEGATIVE
GLUCOSE, UA: NEGATIVE mg/dL
Ketones, ur: 15 mg/dL — AB
Nitrite: POSITIVE — AB
PH: 6.5 (ref 5.0–8.0)
PROTEIN: NEGATIVE mg/dL
Specific Gravity, Urine: 1.02 (ref 1.005–1.030)

## 2015-11-29 LAB — WET PREP, GENITAL
SPERM: NONE SEEN
TRICH WET PREP: NONE SEEN
YEAST WET PREP: NONE SEEN

## 2015-11-29 LAB — URINE MICROSCOPIC-ADD ON

## 2015-11-29 MED ORDER — LABETALOL HCL 200 MG PO TABS: 200.0000 mg | ORAL_TABLET | Freq: Two times a day (BID) | ORAL | 4 refills | Status: DC

## 2015-11-29 MED ORDER — ACETAMINOPHEN 500 MG PO TABS
1000.0000 mg | ORAL_TABLET | Freq: Four times a day (QID) | ORAL | Status: DC | PRN
  Administered 2015-11-29: 1000 mg via ORAL
  Filled 2015-11-29: qty 2

## 2015-11-29 MED ORDER — CEPHALEXIN 500 MG PO CAPS: 500.0000 mg | ORAL_CAPSULE | Freq: Three times a day (TID) | ORAL | 0 refills | Status: DC

## 2015-11-29 MED ORDER — METRONIDAZOLE 500 MG PO TABS: 500.0000 mg | ORAL_TABLET | Freq: Two times a day (BID) | ORAL | 0 refills | Status: DC

## 2015-11-29 NOTE — MAU Provider Note (Signed)
History     CSN: [id number]  Arrival date and time: 11/29/15 1846   None     Chief Complaint  Patient presents with  . Abdominal Pain  . Headache  . Vaginal Discharge   [id number] @[redacted]w[redacted]d  by LMP c/o lower abdominal cramping x1 hour. She denies VB. She reports yellow, malodorous vaginal discharge x3-4 days. She reports new partner x1 week. She also reports dysuria for about 1 week. She denies polyuria, urgency, and hematuria. She also c/o temporal HA since this am. She has not tried Tylenol yet. She reports no water intake today, only soda. She has hx of CMT. She had US 1 mo ago that showed possible IUGS and no FP. [facility].   OB History    Gravida Para Term Preterm AB Living   5 3 3  0 1 3   SAB TAB Ectopic Multiple Live Births   1 0 0 0 3      Past Medical History:  Diagnosis Date  . Chlamydia   . Depression    hospitalized for 2 weeks after suicide att  . Hx of gonorrhea   . Hypertension    gestational HTN  . Kidney infection     Past Surgical History:  Procedure Laterality Date  . NO PAST SURGERIES      Family History  Problem Relation Age of Onset  . Hypertension Mother   . Hypertension Maternal Grandmother   . Hypertension Maternal Aunt   . Depression Cousin     Social History  Substance Use Topics  . Smoking status: Current Every Day Smoker    Packs/day: 0.25  . Smokeless tobacco: Never Used  . Alcohol use No    Allergies: No Known Allergies  Prescriptions Prior to Admission  Medication Sig Dispense Refill Last Dose  . amLODipine (NORVASC) 5 MG tablet Take 1 tablet (5 mg total) by mouth daily. 30 tablet 0     Review of Systems  Constitutional: Negative.   Gastrointestinal: Positive for abdominal pain.  Genitourinary: Positive for dysuria. Negative for flank pain, frequency, hematuria and urgency.  Neurological: Positive for headaches.   Physical Exam   Blood pressure 117/72, pulse 77, temperature 98.8 F (37.1 C),  temperature source Oral, resp. rate 18, height 5\' 3"  (1.6 m), weight 57.6 kg (127 lb), last menstrual period 09/19/2015, not currently breastfeeding.  Physical Exam  Constitutional: She is oriented to person, place, and time. She appears well-developed and well-nourished.  HENT:  Head: Normocephalic and atraumatic.  Neck: Normal range of motion.  Cardiovascular: Normal rate.   Respiratory: Effort normal.  GI: Soft. She exhibits no distension. There is no tenderness.  Genitourinary:  Genitourinary Comments: External: no lesions Vagina: rugated, parous, thin yellow discharge Uterus: enlarged, anteverted, non tender, no CMT Adnexae: no masses, no tenderness left, no tenderness right   Musculoskeletal: Normal range of motion.  Neurological: She is alert and oriented to person, place, and time.  Skin: Skin is warm and dry.  Psychiatric: She has a normal mood and affect.   MAU Course  Procedures Tylenol 1g po x1 Po hydration  Results for orders placed or performed during the hospital encounter of 11/29/15 (from the past 24 hour(s))  Urinalysis, Routine w reflex microscopic (not [facility])     Status: Abnormal   Collection Time: 11/29/15  7:01 PM  Result Value Ref Range   Color, Urine YELLOW YELLOW   APPearance HAZY (A) CLEAR   Specific Gravity, Urine 1.020 1.005 -  1.030   pH 6.5 5.0 - 8.0   Glucose, UA NEGATIVE NEGATIVE mg/dL   Hgb urine dipstick MODERATE (A) NEGATIVE   Bilirubin Urine NEGATIVE NEGATIVE   Ketones, ur 15 (A) NEGATIVE mg/dL   Protein, ur NEGATIVE NEGATIVE mg/dL   Nitrite POSITIVE (A) NEGATIVE   Leukocytes, UA SMALL (A) NEGATIVE  Urine microscopic-add on     Status: Abnormal   Collection Time: 11/29/15  7:01 PM  Result Value Ref Range   Squamous Epithelial / LPF 6-30 (A) NONE SEEN   WBC, UA 6-30 0 - 5 WBC/hpf   RBC / HPF 0-5 0 - 5 RBC/hpf   Bacteria, UA MANY (A) NONE SEEN  Wet prep, genital     Status: Abnormal   Collection Time: 11/29/15  7:40 PM  Result Value  Ref Range   Yeast Wet Prep HPF POC NONE SEEN NONE SEEN   Trich, Wet Prep NONE SEEN NONE SEEN   Clue Cells Wet Prep HPF POC PRESENT (A) NONE SEEN   WBC, Wet Prep HPF POC MANY (A) NONE SEEN   Sperm NONE SEEN     MDM Heart Rate: 170  bpm  CRL:  25.7  mm   9 w   2 d                  [place] EDC: 07/01/2016  Maternal uterus/adnexae:  Subchorionic hemorrhage: None  Right ovary: Normal  Left ovary: Normal  Other :None  Free fluid:  None  IMPRESSION: 1. Single living intrauterine gestation. 2. Estimated gestational age is 9 weeks and 2 days. No complications identified.  Assessment and Plan  25 y.o. [id number] at [redacted]w[redacted]d IUP Bacterial Vaginosis UTI in Pregnancy  Plan: Discharge home RX Flagyl 500 mg BID x 7 days RX Keflex 500 mg TID x 7 days Schedule prenatal care appointment  Marlis Edelson, CNM

## 2015-11-29 NOTE — MAU Note (Signed)
Pt came in by EMS with C/O lower abd cramping & HA.  Has been cramping for the past hour.  Denies bleeding, has yellow discharge with odor & itching.

## 2015-11-29 NOTE — Discharge Instructions (Signed)

## 2015-11-30 LAB — GC/CHLAMYDIA PROBE AMP (~~LOC~~) NOT AT ARMC
Chlamydia: NEGATIVE
Neisseria Gonorrhea: NEGATIVE

## 2015-12-01 ENCOUNTER — Encounter: Payer: Self-pay | Admitting: Obstetrics & Gynecology

## 2015-12-02 LAB — CULTURE, OB URINE

## 2016-01-03 ENCOUNTER — Ambulatory Visit (HOSPITAL_COMMUNITY)
Admission: EM | Admit: 2016-01-03 | Discharge: 2016-01-03 | Disposition: A | Discharge status: Home / Self Care | Payer: Medicaid Other | Attending: Internal Medicine | Admitting: Internal Medicine

## 2016-01-03 ENCOUNTER — Encounter (HOSPITAL_COMMUNITY): Payer: Self-pay | Admitting: *Deleted

## 2016-01-03 DIAGNOSIS — L245 Irritant contact dermatitis due to other chemical products: Secondary | ICD-10-CM | POA: Diagnosis not present

## 2016-01-03 DIAGNOSIS — L309 Dermatitis, unspecified: Secondary | ICD-10-CM | POA: Diagnosis not present

## 2016-01-03 MED ORDER — TRIAMCINOLONE ACETONIDE 0.1 % EX CREA: 1.0000 "application " | TOPICAL_CREAM | Freq: Two times a day (BID) | CUTANEOUS | 0 refills | Status: DC

## 2016-01-03 MED ORDER — HYDROXYZINE HCL 25 MG PO TABS: 25.0000 mg | ORAL_TABLET | Freq: Four times a day (QID) | ORAL | 0 refills | Status: DC

## 2016-01-03 NOTE — ED Triage Notes (Signed)
Pt  Reports  Symptoms  Of a  persistant  Rash  That  She  Has  Had  For  Over  1  Month  The  Rash  Itches    She  Displays   No  Angioedema   And  Is  Sitting  Upright on the  Exam table  In no acute  Distress      She  Is  Alert  And  Oriented

## 2016-01-03 NOTE — Discharge Instructions (Signed)
Avoid triggers or things that will dry the skin Avoid the sun with the cream Do not use the cream if you are pregnant.  May use Aquaphor to help with the areas

## 2016-01-03 NOTE — ED Provider Notes (Signed)
CSN: [id number]     Arrival date & time 01/03/16  1419 History   First MD Initiated Contact with Patient 01/03/16 1506     Chief Complaint  Patient presents with  . Rash   (Consider location/radiation/quality/duration/timing/severity/associated sxs/prior Treatment) Pt has chronic eczema. Use to have a cream that helped but has not been back to doc to have refilled. Pt is having itching to bil legs abd and neck area. Thick areas with no erythema or open areas. No fevers. Has tried lotions but it has not helped.       Past Medical History:  Diagnosis Date  . Chlamydia   . Depression    hospitalized for 2 weeks after suicide att  . Hx of gonorrhea   . Hypertension    gestational HTN  . Kidney infection    Past Surgical History:  Procedure Laterality Date  . NO PAST SURGERIES     Family History  Problem Relation Age of Onset  . Hypertension Mother   . Hypertension Maternal Grandmother   . Hypertension Maternal Aunt   . Depression Cousin    Social History  Substance Use Topics  . Smoking status: Current Every Day Smoker    Packs/day: 0.25  . Smokeless tobacco: Never Used  . Alcohol use No   OB History    Gravida Para Term Preterm AB Living   5 3 3  0 1 3   SAB TAB Ectopic Multiple Live Births   1 0 0 0 3     Review of Systems  Constitutional: Negative.   Respiratory: Negative.   Cardiovascular: Negative.   Skin: Positive for rash.       Thick raised darken rash that is itching to bil legs, abd, and arms.     Allergies  Review of patient's allergies indicates no known allergies.  Home Medications   Prior to Admission medications   Medication Sig Start Date End Date Taking? Authorizing Provider  cephALEXin (KEFLEX) 500 MG capsule Take 1 capsule (500 mg total) by mouth 3 (three) times daily. 11/29/15   Marlis Edelson, CNM  hydrOXYzine (ATARAX/VISTARIL) 25 MG tablet Take 1 tablet (25 mg total) by mouth every 6 (six) hours. 01/03/16   Tobi Bastos, NP   labetalol (NORMODYNE) 200 MG tablet Take 1 tablet (200 mg total) by mouth 2 (two) times daily. 11/29/15   Marlis Edelson, CNM  metroNIDAZOLE (FLAGYL) 500 MG tablet Take 1 tablet (500 mg total) by mouth 2 (two) times daily. 11/29/15   Marlis Edelson, CNM  triamcinolone cream (KENALOG) 0.1 % Apply 1 application topically 2 (two) times daily. 01/03/16   Tobi Bastos, NP   Meds Ordered and Administered this Visit  Medications - No data to display  LMP 09/19/2015  No data found.   Physical Exam  Constitutional: She is oriented to person, place, and time. She appears well-developed.  Cardiovascular: Normal rate and regular rhythm.   Pulmonary/Chest: Effort normal and breath sounds normal.  Abdominal: Soft.  Neurological: She is alert and oriented to person, place, and time.  Skin: Rash noted.  Thick, urticaria to bil upper thighs, bil fa, abd. No pustula's,      Urgent Care Course   Clinical Course    Procedures (including critical care time)  Labs Review Labs Reviewed - No data to display  Imaging Review No results found.           MDM   1. Eczema, unspecified type   2. Irritant contact  dermatitis due to other chemical products    Avoid triggers or things that will dry the skin Avoid the sun with the cream Do not use the cream if you are pregnant.  May use Aquaphor to help with the areas  Will need to see a dermatology or pcp     Tobi BastosMelanie A Keaundre Thelin, NP 01/03/16 1746

## 2016-04-14 ENCOUNTER — Other Ambulatory Visit (HOSPITAL_COMMUNITY): Payer: Self-pay | Admitting: Family

## 2016-04-15 MED ORDER — METRONIDAZOLE 500 MG PO TABS: 500.0000 mg | ORAL_TABLET | Freq: Two times a day (BID) | ORAL | 0 refills | Status: DC

## 2016-08-15 ENCOUNTER — Encounter (HOSPITAL_COMMUNITY): Payer: Self-pay

## 2016-08-15 ENCOUNTER — Inpatient Hospital Stay (HOSPITAL_COMMUNITY)
Admission: AD | Admit: 2016-08-15 | Discharge: 2016-08-15 | Disposition: A | Discharge status: Home / Self Care | Payer: Medicaid Other | Source: Ambulatory Visit | Attending: Obstetrics and Gynecology | Admitting: Obstetrics and Gynecology

## 2016-08-15 DIAGNOSIS — I10 Essential (primary) hypertension: Secondary | ICD-10-CM | POA: Diagnosis not present

## 2016-08-15 DIAGNOSIS — F17210 Nicotine dependence, cigarettes, uncomplicated: Secondary | ICD-10-CM | POA: Insufficient documentation

## 2016-08-15 DIAGNOSIS — N920 Excessive and frequent menstruation with regular cycle: Secondary | ICD-10-CM

## 2016-08-15 DIAGNOSIS — Z3202 Encounter for pregnancy test, result negative: Secondary | ICD-10-CM | POA: Diagnosis not present

## 2016-08-15 DIAGNOSIS — F329 Major depressive disorder, single episode, unspecified: Secondary | ICD-10-CM | POA: Diagnosis not present

## 2016-08-15 DIAGNOSIS — R102 Pelvic and perineal pain: Secondary | ICD-10-CM | POA: Insufficient documentation

## 2016-08-15 DIAGNOSIS — Z79899 Other long term (current) drug therapy: Secondary | ICD-10-CM | POA: Diagnosis not present

## 2016-08-15 LAB — URINALYSIS, ROUTINE W REFLEX MICROSCOPIC
BILIRUBIN URINE: NEGATIVE
Bacteria, UA: NONE SEEN
GLUCOSE, UA: NEGATIVE mg/dL
KETONES UR: 5 mg/dL — AB
Leukocytes, UA: NEGATIVE
Nitrite: NEGATIVE
PH: 6 (ref 5.0–8.0)
Protein, ur: NEGATIVE mg/dL
Specific Gravity, Urine: 1.028 (ref 1.005–1.030)

## 2016-08-15 MED ORDER — IBUPROFEN 800 MG PO TABS
800.0000 mg | ORAL_TABLET | Freq: Once | ORAL | Status: AC
  Administered 2016-08-15: 800 mg via ORAL
  Filled 2016-08-15: qty 1

## 2016-08-15 NOTE — MAU Note (Addendum)
G5P3. R/O pregnancy. States abdominal pain since yesterday. Denies bleeding. Pain 4/10 intermittent that started yesterday. Last period 5/4 2018.   Hx of chronic HTN. States forgets to take medications.   1757: went to give discharge instructions. Pt not in room.

## 2016-08-15 NOTE — MAU Provider Note (Signed)
History     CSN: [id number]  Arrival date and time: 08/15/16 1643   None     Chief Complaint  Patient presents with  . Abdominal Pain   Patient is a 26 y/o G5P3 who presents to MAU with suprapubic pain. She does reports several episodes of loose stools. She reports she is sexually active with a single partner and was recently tested for STDs. She declines any further pelvic exam or STD testing. She was concerned that she is pregnant. She states that she has had no sick contacts. She has no other complaints.     OB History as of 06/25/16    Gravida Para Term Preterm AB Living   5 3 3  0 1 3   SAB TAB Ectopic Multiple Live Births   1 0 0 0 3      Past Medical History:  Diagnosis Date  . Chlamydia   . Depression    hospitalized for 2 weeks after suicide att  . Hx of gonorrhea   . Hypertension    gestational HTN  . Kidney infection     Past Surgical History:  Procedure Laterality Date  . NO PAST SURGERIES      Family History  Problem Relation Age of Onset  . Hypertension Mother   . Hypertension Maternal Grandmother   . Hypertension Maternal Aunt   . Depression Cousin     Social History  Substance Use Topics  . Smoking status: Current Every Day Smoker    Packs/day: 0.25  . Smokeless tobacco: Never Used  . Alcohol use No    Allergies: No Known Allergies  Prescriptions Prior to Admission  Medication Sig Dispense Refill Last Dose  . hydrOXYzine (ATARAX/VISTARIL) 25 MG tablet Take 1 tablet (25 mg total) by mouth every 6 (six) hours. (Patient not taking: Reported on 08/15/2016) 12 tablet 0 Not Taking at Unknown time  . labetalol (NORMODYNE) 200 MG tablet Take 1 tablet (200 mg total) by mouth 2 (two) times daily. (Patient not taking: Reported on 08/15/2016) 60 tablet 4 Not Taking at Unknown time  . metroNIDAZOLE (FLAGYL) 500 MG tablet Take 1 tablet (500 mg total) by mouth 2 (two) times daily. (Patient not taking: Reported on 08/15/2016) 14 tablet 0 Completed Course at  Unknown time    Review of Systems  Constitutional: Negative for chills and fever.  HENT: Negative for congestion and rhinorrhea.   Respiratory: Negative for cough and shortness of breath.   Cardiovascular: Negative for chest pain and palpitations.  Gastrointestinal: Positive for abdominal pain and diarrhea. Negative for abdominal distention, constipation, nausea and vomiting.  Genitourinary: Negative for difficulty urinating, dysuria, flank pain and frequency.  Musculoskeletal: Negative for back pain and neck pain.  Neurological: Negative for dizziness, weakness and headaches.   Physical Exam   Blood pressure (!) 157/93, pulse 71, temperature 98.7 F (37.1 C), temperature source Oral, resp. rate 16, last menstrual period 09/19/2015, unknown if currently breastfeeding.  Physical Exam  Vitals reviewed. Constitutional: She is oriented to person, place, and time. She appears well-developed and well-nourished.  HENT:  Head: Normocephalic and atraumatic.  Neck: Normal range of motion. Neck supple.  Cardiovascular: Normal rate, regular rhythm and intact distal pulses.   No murmur heard. Respiratory: Effort normal and breath sounds normal. No respiratory distress.  GI: Soft. Bowel sounds are normal. She exhibits no distension. There is tenderness. There is no rebound.  Musculoskeletal: Normal range of motion. She exhibits no edema.  Neurological: She is alert and oriented to  person, place, and time.  Skin: Skin is warm and dry.  Psychiatric: She has a normal mood and affect. Her behavior is normal.    MAU Course  Procedures  MDM  IN MAU patient refused pelvic exam.   Vital were reviewed and within normal limits.   UA was negative. Pregnancy test negative  Likely patient is starting her period as she is due to start 6/1.  Patient is noted ot be hypertensive recommended follow up with PCP.  Assessment and Plan  #1: normal period: supportive care recommended. IBU 600mg  q8 hrs  and heating bad/warm shower or bath.  Ernestina Penna 08/15/2016, 5:33 PM

## 2016-08-15 NOTE — Discharge Instructions (Signed)
Dysmenorrhea Dysmenorrhea means painful cramps during your period (menstrual period). You will have pain in your lower belly (abdomen). The pain is caused by the tightening (contracting) of the muscles of the womb (uterus). The pain may be mild or very bad. With this condition, you may:  Have a headache.  Feel sick to your stomach (nauseous).  Throw up (vomit).  Have lower back pain. Follow these instructions at home: Helping pain and cramping   Put heat on your lower back or belly when you have pain or cramps. Use the heat source that your doctor tells you to use.  Place a towel between your skin and the heat.  Leave the heat on for 20-30 minutes.  Remove the heat if your skin turns bright red. This is especially important if you cannot feel pain, heat, or cold.  Do not have a heating pad on during sleep.  Do aerobic exercises. These include walking, swimming, or biking. These may help with cramps.  Massage your lower back or belly. This may help lessen pain. General instructions   Take over-the-counter and prescription medicines only as told by your doctor.  Do not drive or use heavy machinery while taking prescription pain medicine.  Avoid alcohol and caffeine during and right before your period. These can make cramps worse.  Do not use any products that have nicotine or tobacco. These include cigarettes and e-cigarettes. If you need help quitting, ask your doctor.  Keep all follow-up visits as told by your doctor. This is important. Contact a doctor if:  You have pain that gets worse.  You have pain that does not get better with medicine.  You have pain during sex.  You feel sick to your stomach or you throw up during your period, and medicine does not help. Get help right away if:  You pass out (faint). Summary  Dysmenorrhea means painful cramps during your period (menstrual period).  Put heat on your lower back or belly when you have pain or cramps.  Do  exercises like walking, swimming, or biking to help with cramps.  Contact a doctor if you have pain during sex. This information is not intended to replace advice given to you by your health care provider. Make sure you discuss any questions you have with your health care provider. Document Released: 06/03/2008 Document Revised: 03/24/2016 Document Reviewed: 03/24/2016 Else[employer] Interactive Patient Education  2017 [employer]  

## 2016-08-16 LAB — POCT PREGNANCY, URINE: PREG TEST UR: NEGATIVE

## 2016-09-01 ENCOUNTER — Inpatient Hospital Stay (HOSPITAL_COMMUNITY)
Admission: AD | Admit: 2016-09-01 | Discharge: 2016-09-02 | Disposition: A | Discharge status: Home / Self Care | Payer: Medicaid Other | Source: Ambulatory Visit | Attending: Obstetrics & Gynecology | Admitting: Obstetrics & Gynecology

## 2016-09-01 ENCOUNTER — Encounter (HOSPITAL_COMMUNITY): Payer: Self-pay | Admitting: *Deleted

## 2016-09-01 DIAGNOSIS — Z79899 Other long term (current) drug therapy: Secondary | ICD-10-CM | POA: Insufficient documentation

## 2016-09-01 DIAGNOSIS — F17210 Nicotine dependence, cigarettes, uncomplicated: Secondary | ICD-10-CM | POA: Insufficient documentation

## 2016-09-01 DIAGNOSIS — R102 Pelvic and perineal pain: Secondary | ICD-10-CM | POA: Diagnosis present

## 2016-09-01 DIAGNOSIS — O26891 Other specified pregnancy related conditions, first trimester: Secondary | ICD-10-CM | POA: Insufficient documentation

## 2016-09-01 DIAGNOSIS — R109 Unspecified abdominal pain: Secondary | ICD-10-CM | POA: Diagnosis not present

## 2016-09-01 DIAGNOSIS — O131 Gestational [pregnancy-induced] hypertension without significant proteinuria, first trimester: Secondary | ICD-10-CM | POA: Insufficient documentation

## 2016-09-01 DIAGNOSIS — O99341 Other mental disorders complicating pregnancy, first trimester: Secondary | ICD-10-CM | POA: Insufficient documentation

## 2016-09-01 DIAGNOSIS — F329 Major depressive disorder, single episode, unspecified: Secondary | ICD-10-CM | POA: Diagnosis not present

## 2016-09-01 DIAGNOSIS — O99331 Smoking (tobacco) complicating pregnancy, first trimester: Secondary | ICD-10-CM | POA: Insufficient documentation

## 2016-09-01 DIAGNOSIS — Z3490 Encounter for supervision of normal pregnancy, unspecified, unspecified trimester: Secondary | ICD-10-CM

## 2016-09-01 DIAGNOSIS — Z3A01 Less than 8 weeks gestation of pregnancy: Secondary | ICD-10-CM | POA: Diagnosis not present

## 2016-09-01 DIAGNOSIS — O9989 Other specified diseases and conditions complicating pregnancy, childbirth and the puerperium: Secondary | ICD-10-CM | POA: Diagnosis not present

## 2016-09-01 NOTE — MAU Note (Addendum)
Pt says she has  Cramps  - started today .   Did  HPT   On Tuesday -  Positive.   No vag bleeding.     No BC.  Last sex-   2  Weeks.   No meds for cramps

## 2016-09-02 ENCOUNTER — Inpatient Hospital Stay (HOSPITAL_COMMUNITY): Payer: Medicaid Other

## 2016-09-02 DIAGNOSIS — O9989 Other specified diseases and conditions complicating pregnancy, childbirth and the puerperium: Secondary | ICD-10-CM

## 2016-09-02 DIAGNOSIS — R109 Unspecified abdominal pain: Secondary | ICD-10-CM

## 2016-09-02 LAB — URINALYSIS, ROUTINE W REFLEX MICROSCOPIC
Bilirubin Urine: NEGATIVE
GLUCOSE, UA: NEGATIVE mg/dL
Ketones, ur: NEGATIVE mg/dL
NITRITE: NEGATIVE
Protein, ur: NEGATIVE mg/dL
SPECIFIC GRAVITY, URINE: 1.025 (ref 1.005–1.030)
pH: 6 (ref 5.0–8.0)

## 2016-09-02 LAB — WET PREP, GENITAL
Clue Cells Wet Prep HPF POC: NONE SEEN
Sperm: NONE SEEN
TRICH WET PREP: NONE SEEN
YEAST WET PREP: NONE SEEN

## 2016-09-02 LAB — GC/CHLAMYDIA PROBE AMP (~~LOC~~) NOT AT ARMC
Chlamydia: NEGATIVE
Neisseria Gonorrhea: NEGATIVE

## 2016-09-02 LAB — POCT PREGNANCY, URINE: Preg Test, Ur: POSITIVE — AB

## 2016-09-02 LAB — CBC
HCT: 33.2 % — ABNORMAL LOW (ref 36.0–46.0)
Hemoglobin: 10.9 g/dL — ABNORMAL LOW (ref 12.0–15.0)
MCH: 27.5 pg (ref 26.0–34.0)
MCHC: 32.8 g/dL (ref 30.0–36.0)
MCV: 83.8 fL (ref 78.0–100.0)
PLATELETS: 325 10*3/uL (ref 150–400)
RBC: 3.96 MIL/uL (ref 3.87–5.11)
RDW: 13.8 % (ref 11.5–15.5)
WBC: 4.9 10*3/uL (ref 4.0–10.5)

## 2016-09-02 LAB — RPR: RPR: NONREACTIVE

## 2016-09-02 LAB — HIV ANTIBODY (ROUTINE TESTING W REFLEX): HIV Screen 4th Generation wRfx: NONREACTIVE

## 2016-09-02 LAB — HCG, QUANTITATIVE, PREGNANCY: hCG, Beta Chain, Quant, S: 9078 m[IU]/mL — ABNORMAL HIGH (ref <5)

## 2016-09-02 MED ORDER — PRENATAL VITAMIN AND MINERAL 28-0.8 MG PO TABS: 1.0000 | ORAL_TABLET | Freq: Every day | ORAL | 11 refills | Status: DC

## 2016-09-02 NOTE — Discharge Instructions (Signed)
First Trimester of Pregnancy The first trimester of pregnancy is from week 1 until the end of week 13 (months 1 through 3). A week after a sperm fertilizes an egg, the egg will implant on the wall of the uterus. This embryo will begin to develop into a baby. Genes from you and your partner will form the baby. The female genes will determine whether the baby will be a boy or a girl. At 6-8 weeks, the eyes and face will be formed, and the heartbeat can be seen on ultrasound. At the end of 12 weeks, all the baby's organs will be formed. Now that you are pregnant, you will want to do everything you can to have a healthy baby. Two of the most important things are to get good prenatal care and to follow your health care provider's instructions. Prenatal care is all the medical care you receive before the baby's birth. This care will help prevent, find, and treat any problems during the pregnancy and childbirth. Body changes during your first trimester Your body goes through many changes during pregnancy. The changes vary from woman to woman.  You may gain or lose a couple of pounds at first.  You may feel sick to your stomach (nauseous) and you may throw up (vomit). If the vomiting is uncontrollable, call your health care provider.  You may tire easily.  You may develop headaches that can be relieved by medicines. All medicines should be approved by your health care provider.  You may urinate more often. Painful urination may mean you have a bladder infection.  You may develop heartburn as a result of your pregnancy.  You may develop constipation because certain hormones are causing the muscles that push stool through your intestines to slow down.  You may develop hemorrhoids or swollen veins (varicose veins).  Your breasts may begin to grow larger and become tender. Your nipples may stick out more, and the tissue that surrounds them (areola) may become darker.  Your gums may bleed and may be  sensitive to brushing and flossing.  Dark spots or blotches (chloasma, mask of pregnancy) may develop on your face. This will likely fade after the baby is born.  Your menstrual periods will stop.  You may have a loss of appetite.  You may develop cravings for certain kinds of food.  You may have changes in your emotions from day to day, such as being excited to be pregnant or being concerned that something may go wrong with the pregnancy and baby.  You may have more vivid and strange dreams.  You may have changes in your hair. These can include thickening of your hair, rapid growth, and changes in texture. Some women also have hair loss during or after pregnancy, or hair that feels dry or thin. Your hair will most likely return to normal after your baby is born.  What to expect at prenatal visits During a routine prenatal visit:  You will be weighed to make sure you and the baby are growing normally.  Your blood pressure will be taken.  Your abdomen will be measured to track your baby's growth.  The fetal heartbeat will be listened to between weeks 10 and 14 of your pregnancy.  Test results from any previous visits will be discussed.  Your health care provider may ask you:  How you are feeling.  If you are feeling the baby move.  If you have had any abnormal symptoms, such as leaking fluid, bleeding, severe headaches,   or abdominal cramping.  If you are using any tobacco products, including cigarettes, chewing tobacco, and electronic cigarettes.  If you have any questions.  Other tests that may be performed during your first trimester include:  Blood tests to find your blood type and to check for the presence of any previous infections. The tests will also be used to check for low iron levels (anemia) and protein on red blood cells (Rh antibodies). Depending on your risk factors, or if you previously had diabetes during pregnancy, you may have tests to check for high blood  sugar that affects pregnant women (gestational diabetes).  Urine tests to check for infections, diabetes, or protein in the urine.  An ultrasound to confirm the proper growth and development of the baby.  Fetal screens for spinal cord problems (spina bifida) and Down syndrome.  HIV (human immunodeficiency virus) testing. Routine prenatal testing includes screening for HIV, unless you choose not to have this test.  You may need other tests to make sure you and the baby are doing well.  Follow these instructions at home: Medicines  Follow your health care provider's instructions regarding medicine use. Specific medicines may be either safe or unsafe to take during pregnancy.  Take a prenatal vitamin that contains at least 600 micrograms (mcg) of folic acid.  If you develop constipation, try taking a stool softener if your health care provider approves. Eating and drinking  Eat a balanced diet that includes fresh fruits and vegetables, whole grains, good sources of protein such as meat, eggs, or tofu, and low-fat dairy. Your health care provider will help you determine the amount of weight gain that is right for you.  Avoid raw meat and uncooked cheese. These carry germs that can cause birth defects in the baby.  Eating four or five small meals rather than three large meals a day may help relieve nausea and vomiting. If you start to feel nauseous, eating a few soda crackers can be helpful. Drinking liquids between meals, instead of during meals, also seems to help ease nausea and vomiting.  Limit foods that are high in fat and processed sugars, such as fried and sweet foods.  To prevent constipation: ? Eat foods that are high in fiber, such as fresh fruits and vegetables, whole grains, and beans. ? Drink enough fluid to keep your urine clear or pale yellow. Activity  Exercise only as directed by your health care provider. Most women can continue their usual exercise routine during  pregnancy. Try to exercise for 30 minutes at least 5 days a week. Exercising will help you: ? Control your weight. ? Stay in shape. ? Be prepared for labor and delivery.  Experiencing pain or cramping in the lower abdomen or lower back is a good sign that you should stop exercising. Check with your health care provider before continuing with normal exercises.  Try to avoid standing for long periods of time. Move your legs often if you must stand in one place for a long time.  Avoid heavy lifting.  Wear low-heeled shoes and practice good posture.  You may continue to have sex unless your health care provider tells you not to. Relieving pain and discomfort  Wear a good support bra to relieve breast tenderness.  Take warm sitz baths to soothe any pain or discomfort caused by hemorrhoids. Use hemorrhoid cream if your health care provider approves.  Rest with your legs elevated if you have leg cramps or low back pain.  If you develop   varicose veins in your legs, wear support hose. Elevate your feet for 15 minutes, 3-4 times a day. Limit salt in your diet. Prenatal care  Schedule your prenatal visits by the twelfth week of pregnancy. They are usually scheduled monthly at first, then more often in the last 2 months before delivery.  Write down your questions. Take them to your prenatal visits.  Keep all your prenatal visits as told by your health care provider. This is important. Safety  Wear your seat belt at all times when driving.  Make a list of emergency phone numbers, including numbers for family, friends, the hospital, and police and fire departments. General instructions  Ask your health care provider for a referral to a local prenatal education class. Begin classes no later than the beginning of month 6 of your pregnancy.  Ask for help if you have counseling or nutritional needs during pregnancy. Your health care provider can offer advice or refer you to specialists for help  with various needs.  Do not use hot tubs, steam rooms, or saunas.  Do not douche or use tampons or scented sanitary pads.  Do not cross your legs for long periods of time.  Avoid cat litter boxes and soil used by cats. These carry germs that can cause birth defects in the baby and possibly loss of the fetus by miscarriage or stillbirth.  Avoid all smoking, herbs, alcohol, and medicines not prescribed by your health care provider. Chemicals in these products affect the formation and growth of the baby.  Do not use any products that contain nicotine or tobacco, such as cigarettes and e-cigarettes. If you need help quitting, ask your health care provider. You may receive counseling support and other resources to help you quit.  Schedule a dentist appointment. At home, brush your teeth with a soft toothbrush and be gentle when you floss. Contact a health care provider if:  You have dizziness.  You have mild pelvic cramps, pelvic pressure, or nagging pain in the abdominal area.  You have persistent nausea, vomiting, or diarrhea.  You have a bad smelling vaginal discharge.  You have pain when you urinate.  You notice increased swelling in your face, hands, legs, or ankles.  You are exposed to fifth disease or chickenpox.  You are exposed to German measles (rubella) and have never had it. Get help right away if:  You have a fever.  You are leaking fluid from your vagina.  You have spotting or bleeding from your vagina.  You have severe abdominal cramping or pain.  You have rapid weight gain or loss.  You vomit blood or material that looks like coffee grounds.  You develop a severe headache.  You have shortness of breath.  You have any kind of trauma, such as from a fall or a car accident. Summary  The first trimester of pregnancy is from week 1 until the end of week 13 (months 1 through 3).  Your body goes through many changes during pregnancy. The changes vary from  woman to woman.  You will have routine prenatal visits. During those visits, your health care provider will examine you, discuss any test results you may have, and talk with you about how you are feeling. This information is not intended to replace advice given to you by your health care provider. Make sure you discuss any questions you have with your health care provider. Document Released: 03/01/2001 Document Revised: 02/17/2016 Document Reviewed: 02/17/2016 [employer] Interactive Patient Education  2017 [employer]  

## 2016-09-02 NOTE — MAU Provider Note (Signed)
History     CSN: [id number]  Arrival date and time: 09/01/16 2300   First Provider Initiated Contact with Patient 09/02/16 0021      Chief Complaint  Patient presents with  . Pelvic Pain   Pelvic Pain  The patient's primary symptoms include pelvic pain. The patient's pertinent negatives include no vaginal discharge. This is a new problem. The current episode started today. The problem occurs constantly. The problem has been unchanged. Pain severity now: 4/10. The problem affects both sides. She is pregnant. Pertinent negatives include no chills, dysuria, fever, frequency, nausea, urgency or vomiting. The vaginal discharge was normal. There has been no bleeding. Nothing aggravates the symptoms. She has tried nothing for the symptoms. Her menstrual history has been regular (LMP 07/22/16 ).      Past Medical History:  Diagnosis Date  . Chlamydia   . Depression    hospitalized for 2 weeks after suicide att  . Hx of gonorrhea   . Hypertension    gestational HTN  . Kidney infection     Past Surgical History:  Procedure Laterality Date  . NO PAST SURGERIES      Family History  Problem Relation Age of Onset  . Hypertension Mother   . Hypertension Maternal Grandmother   . Hypertension Maternal Aunt   . Depression Cousin     Social History  Substance Use Topics  . Smoking status: Current Every Day Smoker    Packs/day: 0.25  . Smokeless tobacco: Never Used  . Alcohol use No    Allergies: No Known Allergies  Prescriptions Prior to Admission  Medication Sig Dispense Refill Last Dose  . hydrOXYzine (ATARAX/VISTARIL) 25 MG tablet Take 1 tablet (25 mg total) by mouth every 6 (six) hours. (Patient not taking: Reported on 08/15/2016) 12 tablet 0 Not Taking at Unknown time  . labetalol (NORMODYNE) 200 MG tablet Take 1 tablet (200 mg total) by mouth 2 (two) times daily. (Patient not taking: Reported on 08/15/2016) 60 tablet 4 Not Taking at Unknown time    Review of Systems   Constitutional: Negative for chills and fever.  Gastrointestinal: Negative for nausea and vomiting.  Genitourinary: Positive for pelvic pain. Negative for dysuria, frequency, urgency, vaginal bleeding and vaginal discharge.   Physical Exam   Blood pressure (!) 144/84, pulse 73, temperature 98.6 F (37 C), temperature source Oral, resp. rate 17, height 5\' 2"  (1.575 m), weight 121 lb (54.9 kg), last menstrual period 07/22/2016, unknown if currently breastfeeding.  Physical Exam  Nursing note and vitals reviewed. Constitutional: She is oriented to person, place, and time. She appears well-developed and well-nourished. No distress.  HENT:  Head: Normocephalic.  Cardiovascular: Normal rate.   Respiratory: Effort normal.  GI: Soft. There is no tenderness. There is no rebound.  Neurological: She is alert and oriented to person, place, and time.  Skin: Skin is warm and dry.  Psychiatric: She has a normal mood and affect.   Results for orders placed or performed during the hospital encounter of 09/01/16 (from the past 24 hour(s))  Urinalysis, Routine w reflex microscopic     Status: Abnormal   Collection Time: 09/01/16 11:10 PM  Result Value Ref Range   Color, Urine YELLOW YELLOW   APPearance HAZY (A) CLEAR   Specific Gravity, Urine 1.025 1.005 - 1.030   pH 6.0 5.0 - 8.0   Glucose, UA NEGATIVE NEGATIVE mg/dL   Hgb urine dipstick SMALL (A) NEGATIVE   Bilirubin Urine NEGATIVE NEGATIVE   Ketones, ur NEGATIVE  NEGATIVE mg/dL   Protein, ur NEGATIVE NEGATIVE mg/dL   Nitrite NEGATIVE NEGATIVE   Leukocytes, UA TRACE (A) NEGATIVE   RBC / HPF 0-5 0 - 5 RBC/hpf   WBC, UA 6-30 0 - 5 WBC/hpf   Bacteria, UA RARE (A) NONE SEEN   Squamous Epithelial / LPF 6-30 (A) NONE SEEN   Mucous PRESENT   Pregnancy, urine POC     Status: Abnormal   Collection Time: 09/02/16 12:05 AM  Result Value Ref Range   Preg Test, Ur POSITIVE (A) NEGATIVE  Wet prep, genital     Status: Abnormal   Collection Time:  09/02/16 12:38 AM  Result Value Ref Range   Yeast Wet Prep HPF POC NONE SEEN NONE SEEN   Trich, Wet Prep NONE SEEN NONE SEEN   Clue Cells Wet Prep HPF POC NONE SEEN NONE SEEN   WBC, Wet Prep HPF POC FEW (A) NONE SEEN   Sperm NONE SEEN   CBC     Status: Abnormal   Collection Time: 09/02/16 12:52 AM  Result Value Ref Range   WBC 4.9 4.0 - 10.5 K/uL   RBC 3.96 3.87 - 5.11 MIL/uL   Hemoglobin 10.9 (L) 12.0 - 15.0 g/dL   HCT 09.833.2 (L) 11.936.0 - 14.746.0 %   MCV 83.8 78.0 - 100.0 fL   MCH 27.5 26.0 - 34.0 pg   MCHC 32.8 30.0 - 36.0 g/dL   RDW 82.913.8 56.211.5 - 13.015.5 %   Platelets 325 150 - 400 K/uL  hCG, quantitative, pregnancy     Status: Abnormal   Collection Time: 09/02/16 12:54 AM  Result Value Ref Range   hCG, Beta Chain, Quant, S 9,078 (H) <5 mIU/mL   [place] Ob Comp Less 14 Wks  Result Date: 09/02/2016 CLINICAL DATA:  Pain in cramping for 1 day EXAM: OBSTETRIC <14 WK US AND TRANSVAGINAL OB US TECHNIQUE: Both transabdominal and transvaginal ultrasound examinations were performed for complete evaluation of the gestation as well as the maternal uterus, adnexal regions, and pelvic cul-de-sac. Transvaginal technique was performed to assess early pregnancy. COMPARISON:  None. FINDINGS: Intrauterine gestational sac: Single intrauterine gestation Yolk sac:  Visualized Embryo:  Not visualized MSD: 6.6  mm   5 w   2  d Subchorionic hemorrhage:  Small subchorionic hemorrhage inferiorly. Maternal uterus/adnexae: Ovaries are within normal limits. The right ovary measures 4.8 x 2.1 by 3.8 cm. The left ovary measures 4.4 by 4.4 x 1.9 cm. Small amount of anechoic fluid adjacent to the left ovary. IMPRESSION: 1. Intrauterine pregnancy as above with visualization of gestational sac and yolk sac but no fetal pole. Follow-up ultrasound in 10-14 days could be obtained to confirm viability. 2. Small amount of subchorionic hemorrhage 3. Small amount of free fluid in the pelvis. Electronically Signed   By: Jasmine PangKim  Fujinaga M.D.   On:  09/02/2016 02:12   [place] Ob Transvaginal  Result Date: 09/02/2016 CLINICAL DATA:  Pain in cramping for 1 day EXAM: OBSTETRIC <14 WK US AND TRANSVAGINAL OB US TECHNIQUE: Both transabdominal and transvaginal ultrasound examinations were performed for complete evaluation of the gestation as well as the maternal uterus, adnexal regions, and pelvic cul-de-sac. Transvaginal technique was performed to assess early pregnancy. COMPARISON:  None. FINDINGS: Intrauterine gestational sac: Single intrauterine gestation Yolk sac:  Visualized Embryo:  Not visualized MSD: 6.6  mm   5 w   2  d Subchorionic hemorrhage:  Small subchorionic hemorrhage inferiorly. Maternal uterus/adnexae: Ovaries are within normal limits. The right ovary measures  4.8 x 2.1 by 3.8 cm. The left ovary measures 4.4 by 4.4 x 1.9 cm. Small amount of anechoic fluid adjacent to the left ovary. IMPRESSION: 1. Intrauterine pregnancy as above with visualization of gestational sac and yolk sac but no fetal pole. Follow-up ultrasound in 10-14 days could be obtained to confirm viability. 2. Small amount of subchorionic hemorrhage 3. Small amount of free fluid in the pelvis. Electronically Signed   By: Jasmine Pang M.D.   On: 09/02/2016 02:12    MAU Course  Procedures  MDM  Assessment and Plan   1. Intrauterine pregnancy   2. Pelvic pain in pregnancy, antepartum, first trimester    DC home Comfort measures reviewed  1st Trimester precautions  Bleeding precautions RX: none  Return to MAU as needed FU with OB as planned  Follow-up Information    [facility]. Schedule an appointment as soon as possible for a visit.   Specialty:  Obstetrics and Gynecology Contact information: [address] [place] [address] [phone]           Thressa Sheller 09/02/2016, 12:28 AM

## 2016-09-02 NOTE — MAU Note (Signed)
Wet prep & GC culture obtained via vagina, labeled and sent to lab.  Pt. Tolerated procedure well.

## 2016-09-07 ENCOUNTER — Emergency Department (HOSPITAL_COMMUNITY)
Admission: EM | Admit: 2016-09-07 | Discharge: 2016-09-07 | Disposition: A | Discharge status: Home / Self Care | Payer: Medicaid Other | Attending: Emergency Medicine | Admitting: Emergency Medicine

## 2016-09-07 ENCOUNTER — Emergency Department (HOSPITAL_COMMUNITY): Payer: Medicaid Other

## 2016-09-07 ENCOUNTER — Encounter (HOSPITAL_COMMUNITY): Payer: Self-pay

## 2016-09-07 DIAGNOSIS — I10 Essential (primary) hypertension: Secondary | ICD-10-CM | POA: Insufficient documentation

## 2016-09-07 DIAGNOSIS — H538 Other visual disturbances: Secondary | ICD-10-CM | POA: Diagnosis not present

## 2016-09-07 DIAGNOSIS — R0602 Shortness of breath: Secondary | ICD-10-CM | POA: Diagnosis not present

## 2016-09-07 DIAGNOSIS — F17200 Nicotine dependence, unspecified, uncomplicated: Secondary | ICD-10-CM | POA: Diagnosis not present

## 2016-09-07 DIAGNOSIS — R0789 Other chest pain: Secondary | ICD-10-CM | POA: Diagnosis not present

## 2016-09-07 DIAGNOSIS — R079 Chest pain, unspecified: Secondary | ICD-10-CM | POA: Diagnosis present

## 2016-09-07 LAB — CBC WITH DIFFERENTIAL/PLATELET
Basophils Absolute: 0 10*3/uL (ref 0.0–0.1)
Basophils Relative: 0 %
EOS ABS: 0.1 10*3/uL (ref 0.0–0.7)
EOS PCT: 3 %
HCT: 31.2 % — ABNORMAL LOW (ref 36.0–46.0)
HEMOGLOBIN: 10 g/dL — AB (ref 12.0–15.0)
LYMPHS ABS: 1.7 10*3/uL (ref 0.7–4.0)
LYMPHS PCT: 36 %
MCH: 26.9 pg (ref 26.0–34.0)
MCHC: 32.1 g/dL (ref 30.0–36.0)
MCV: 83.9 fL (ref 78.0–100.0)
MONOS PCT: 7 %
Monocytes Absolute: 0.3 10*3/uL (ref 0.1–1.0)
Neutro Abs: 2.5 10*3/uL (ref 1.7–7.7)
Neutrophils Relative %: 54 %
PLATELETS: 291 10*3/uL (ref 150–400)
RBC: 3.72 MIL/uL — ABNORMAL LOW (ref 3.87–5.11)
RDW: 13.5 % (ref 11.5–15.5)
WBC: 4.7 10*3/uL (ref 4.0–10.5)

## 2016-09-07 LAB — BASIC METABOLIC PANEL
Anion gap: 7 (ref 5–15)
BUN: 15 mg/dL (ref 6–20)
CHLORIDE: 106 mmol/L (ref 101–111)
CO2: 24 mmol/L (ref 22–32)
CREATININE: 0.63 mg/dL (ref 0.44–1.00)
Calcium: 8.7 mg/dL — ABNORMAL LOW (ref 8.9–10.3)
GFR calc Af Amer: >60 mL/min (ref >60)
GFR calc non Af Amer: >60 mL/min (ref >60)
Glucose, Bld: 90 mg/dL (ref 65–99)
Potassium: 3.4 mmol/L — ABNORMAL LOW (ref 3.5–5.1)
Sodium: 137 mmol/L (ref 135–145)

## 2016-09-07 LAB — HCG, QUANTITATIVE, PREGNANCY: HCG, BETA CHAIN, QUANT, S: 22305 m[IU]/mL — AB (ref <5)

## 2016-09-07 MED ORDER — ACETAMINOPHEN 325 MG PO TABS
650.0000 mg | ORAL_TABLET | Freq: Once | ORAL | Status: AC
  Administered 2016-09-07: 650 mg via ORAL
  Filled 2016-09-07: qty 2

## 2016-09-07 NOTE — Discharge Instructions (Signed)
As discussed, your evaluation today has been largely reassuring.  But, it is important that you monitor your condition carefully, and do not hesitate to return to the ED if you develop new, or concerning changes in your condition. ? ?Otherwise, please follow-up with your physician for appropriate ongoing care. ? ?

## 2016-09-07 NOTE — ED Provider Notes (Signed)
[facility]-EMERGENCY DEPT Provider Note   CSN: [id number] Arrival date & time: 09/07/16  0503     History   Chief Complaint Chief Complaint  Patient presents with  . Chest Pain  . Anxiety    HPI Sharon Johnston is a 26 y.o. female.  HPI Patient presents with concern of chest pain. Pain began possibly about 8 hours ago, since onset has been persistent, mild, sore, sharp, and the left inframammary area. No new syncope no dyspnea, no fever, no chills. Patient denies history of cardiac disease, does have history of hypertension, though she takes no medication. Patient has 3 children, and last week found out that she was pregnant, pregnancy #5. She is G5, P3. She has not yet seen an obstetrician. She thinks that her last menstrual period was 2 months ago. She denies abdominal pain, vaginal bleeding. She has had occasional cramping, though not sustained.    Past Medical History:  Diagnosis Date  . Chlamydia   . Depression    hospitalized for 2 weeks after suicide att  . Hx of gonorrhea   . Hypertension    gestational HTN  . Kidney infection     Patient Active Problem List   Diagnosis Date Noted  . Chronic hypertension 10/31/2015  . Chronic hypertension complicating or reason for care during pregnancy 10/31/2015  . Depression 06/15/2013    Past Surgical History:  Procedure Laterality Date  . NO PAST SURGERIES      OB History    Gravida Para Term Preterm AB Living   5 3 3  0 1 3   SAB TAB Ectopic Multiple Live Births   1 0 0 0 3       Home Medications    Prior to Admission medications   Medication Sig Start Date End Date Taking? Authorizing Provider  hydrOXYzine (ATARAX/VISTARIL) 25 MG tablet Take 1 tablet (25 mg total) by mouth every 6 (six) hours. Patient not taking: Reported on 08/15/2016 01/03/16   Maple Mirza A, NP  labetalol (NORMODYNE) 200 MG tablet Take 1 tablet (200 mg total) by mouth 2 (two) times daily. Patient not taking: Reported on 08/15/2016  11/29/15   Marlis Edelson, CNM  Prenatal Vit-Fe Fumarate-FA (PRENATAL VITAMIN AND MINERAL) 28-0.8 MG TABS Take 1 tablet by mouth daily. 09/02/16   Armando Reichert, CNM    Family History Family History  Problem Relation Age of Onset  . Hypertension Mother   . Hypertension Maternal Grandmother   . Hypertension Maternal Aunt   . Depression Cousin     Social History Social History  Substance Use Topics  . Smoking status: Current Every Day Smoker    Packs/day: 0.25  . Smokeless tobacco: Never Used  . Alcohol use No     Allergies   Patient has no known allergies.   Review of Systems Review of Systems  Constitutional:       Per HPI, otherwise negative  HENT:       Per HPI, otherwise negative  Respiratory:       Per HPI, otherwise negative  Cardiovascular:       Per HPI, otherwise negative  Gastrointestinal: Negative for vomiting.  Endocrine:       Negative aside from HPI  Genitourinary:       Neg aside from HPI   Musculoskeletal:       Per HPI, otherwise negative  Skin: Negative.   Neurological: Negative for syncope.     Physical Exam Updated Vital Signs BP 122/81 (BP  Location: Right Arm)   Pulse (!) 56   Temp 98.5 F (36.9 C) (Oral)   Resp 14   Ht 5\' 3"  (1.6 m)   Wt 54.4 kg (120 lb)   SpO2 98%   BMI 21.26 kg/m   Physical Exam  Constitutional: She is oriented to person, place, and time. She appears well-developed and well-nourished. No distress.  HENT:  Head: Normocephalic and atraumatic.  Eyes: Conjunctivae and EOM are normal.  Cardiovascular: Normal rate and regular rhythm.   Pulmonary/Chest: Effort normal and breath sounds normal. No stridor. No respiratory distress.  Abdominal: She exhibits no distension. There is no tenderness. There is no rigidity and no guarding.  Musculoskeletal: She exhibits no edema.  Neurological: She is alert and oriented to person, place, and time. No cranial nerve deficit.  Skin: Skin is warm and dry.  Psychiatric: She  has a normal mood and affect.  Nursing note and vitals reviewed.    ED Treatments / Results  Labs (all labs ordered are listed, but only abnormal results are displayed) Labs Reviewed  BASIC METABOLIC PANEL - Abnormal; Notable for the following:       Result Value   Potassium 3.4 (*)    Calcium 8.7 (*)    All other components within normal limits  CBC WITH DIFFERENTIAL/PLATELET - Abnormal; Notable for the following:    RBC 3.72 (*)    Hemoglobin 10.0 (*)    HCT 31.2 (*)    All other components within normal limits  HCG, QUANTITATIVE, PREGNANCY - Abnormal; Notable for the following:    hCG, Beta Chain, Quant, S 22,305 (*)    All other components within normal limits    EKG  EKG Interpretation  Date/Time:  Wednesday September 07 2016 05:19:46 EDT Ventricular Rate:  65 PR Interval:    QRS Duration: 82 QT Interval:  414 QTC Calculation: 431 R Axis:   67 Text Interpretation:  Sinus rhythm ST elev, probable normal early repol pattern When compared with ECG of 04/25/2015, No significant change was found Confirmed by Dione Booze ([id number]) on 09/07/2016 5:38:00 AM       Radiology Dg Chest 2 View  Result Date: 09/07/2016 CLINICAL DATA:  Chest pain and shortness of breath EXAM: CHEST  2 VIEW COMPARISON:  June 13, 2004 FINDINGS: Lungs are clear. Heart size and pulmonary vascularity are normal. No adenopathy. No bone lesions. No pneumothorax. IMPRESSION: No edema or consolidation. Electronically Signed   By: Bretta Bang III M.D.   On: 09/07/2016 07:53    Procedures Procedures (including critical care time)  Medications Ordered in ED Medications  acetaminophen (TYLENOL) tablet 650 mg (650 mg Oral Given 09/07/16 0759)    10:24 AM On repeat exam patient is awake and alert, in no distress. I discussed all findings with her at length also reviewed her [facility] evaluation from within the past week, and ultrasound results. With evidence for IUP, and prior recommendation for  ultrasound repeat within 10/14 days, I discussed the importance of this with the patient. Otherwise reassuring findings, no evidence for ACS, PE, pneumonia, pneumothorax or other acute new findings, the patient was discharged with outpatient follow-up. Initial Impression / Assessment and Plan / ED Course  I have reviewed the triage vital signs and the nursing notes.  Pertinent labs & imaging results that were available during my care of the patient were reviewed by me and considered in my medical decision making (see chart for details).    Final Clinical Impressions(s) / ED  Diagnoses   Final diagnoses:  Atypical chest pain     Gerhard MunchLockwood, Myrissa Chipley, MD 09/07/16 1025

## 2016-09-07 NOTE — ED Notes (Signed)
Patient is alert and oriented x3.  She was given DC instructions and follow up visit instructions.  Patient gave verbal understanding. She was DC ambulatory under her own power to home.  V/S stable.  He was not showing any signs of distress on DC 

## 2016-09-07 NOTE — ED Notes (Signed)
Patient is alert and oriented x4.  She is complaining of chest pain that started early this morning and work her up from sleep.  Patient adds that since February she has had difficulty sleeping due to witnessing her baby daddy being killed.  Patient states that is when her panic attacks started

## 2016-09-07 NOTE — ED Notes (Signed)
Bed: [facility] Expected date:  Expected time:  Means of arrival:  Comments: 

## 2016-09-07 NOTE — ED Notes (Addendum)
Pt woke up 45 minutes ago with 6/10 chest pain and SOB. Hurts more with movement. States had blurry vision. Believes that it could be anxiety. Confirmed pregnancy test last week.

## 2016-09-22 ENCOUNTER — Inpatient Hospital Stay (HOSPITAL_COMMUNITY)
Admission: AD | Admit: 2016-09-22 | Discharge: 2016-09-22 | Disposition: A | Discharge status: Home / Self Care | Payer: Medicaid Other | Source: Ambulatory Visit | Attending: Obstetrics & Gynecology | Admitting: Obstetrics & Gynecology

## 2016-09-22 ENCOUNTER — Encounter (HOSPITAL_COMMUNITY): Payer: Self-pay

## 2016-09-22 ENCOUNTER — Inpatient Hospital Stay (HOSPITAL_COMMUNITY): Payer: Medicaid Other

## 2016-09-22 DIAGNOSIS — R109 Unspecified abdominal pain: Secondary | ICD-10-CM

## 2016-09-22 DIAGNOSIS — O26891 Other specified pregnancy related conditions, first trimester: Secondary | ICD-10-CM | POA: Diagnosis not present

## 2016-09-22 DIAGNOSIS — O468X1 Other antepartum hemorrhage, first trimester: Secondary | ICD-10-CM

## 2016-09-22 DIAGNOSIS — N760 Acute vaginitis: Secondary | ICD-10-CM

## 2016-09-22 DIAGNOSIS — O23591 Infection of other part of genital tract in pregnancy, first trimester: Secondary | ICD-10-CM | POA: Insufficient documentation

## 2016-09-22 DIAGNOSIS — B9689 Other specified bacterial agents as the cause of diseases classified elsewhere: Secondary | ICD-10-CM | POA: Insufficient documentation

## 2016-09-22 DIAGNOSIS — O208 Other hemorrhage in early pregnancy: Secondary | ICD-10-CM | POA: Diagnosis not present

## 2016-09-22 DIAGNOSIS — F17210 Nicotine dependence, cigarettes, uncomplicated: Secondary | ICD-10-CM | POA: Diagnosis not present

## 2016-09-22 DIAGNOSIS — O418X10 Other specified disorders of amniotic fluid and membranes, first trimester, not applicable or unspecified: Secondary | ICD-10-CM

## 2016-09-22 DIAGNOSIS — O99331 Smoking (tobacco) complicating pregnancy, first trimester: Secondary | ICD-10-CM | POA: Insufficient documentation

## 2016-09-22 DIAGNOSIS — Z3A08 8 weeks gestation of pregnancy: Secondary | ICD-10-CM | POA: Diagnosis not present

## 2016-09-22 LAB — URINALYSIS, ROUTINE W REFLEX MICROSCOPIC
BILIRUBIN URINE: NEGATIVE
GLUCOSE, UA: NEGATIVE mg/dL
KETONES UR: NEGATIVE mg/dL
Nitrite: NEGATIVE
PH: 5 (ref 5.0–8.0)
PROTEIN: NEGATIVE mg/dL
Specific Gravity, Urine: 1.025 (ref 1.005–1.030)

## 2016-09-22 LAB — WET PREP, GENITAL
Sperm: NONE SEEN
Trich, Wet Prep: NONE SEEN
Yeast Wet Prep HPF POC: NONE SEEN

## 2016-09-22 MED ORDER — ACETAMINOPHEN 325 MG PO TABS
650.0000 mg | ORAL_TABLET | Freq: Once | ORAL | Status: AC
  Administered 2016-09-22: 650 mg via ORAL
  Filled 2016-09-22: qty 2

## 2016-09-22 MED ORDER — METRONIDAZOLE 500 MG PO TABS: 500.0000 mg | ORAL_TABLET | Freq: Two times a day (BID) | ORAL | 0 refills | Status: DC

## 2016-09-22 NOTE — MAU Note (Signed)
Pt. Here for abdominal cramping, pt. Not sure when cramping started.  Pt. Is 1832w6d gestation.  Denies vaginal bleeding, but has a mod. Discharge, clear, with a foul odor.

## 2016-09-22 NOTE — MAU Provider Note (Signed)
History     CSN: [id number]  Arrival date and time: 09/22/16 0440   First Provider Initiated Contact with Patient 09/22/16 [phone]      Chief Complaint  Patient presents with  . Abdominal Cramping   HPI  [name] is a 26 y.o. [phone] at [redacted]w[redacted]d who presents to MAU today with complaint of abdominal cramping since earlier this morning. She rates her pain at 5/10 upon arrival in MAU. She has not taken anything for pain. She denies vaginal bleeding, UTI symptoms, N/V/D or constipation or fever today. She has had a clear discharge with foul odor.   OB History    Gravida Para Term Preterm AB Living   6 3 3  0 2 3   SAB TAB Ectopic Multiple Live Births   1 1 0 0 3      Past Medical History:  Diagnosis Date  . Chlamydia   . Depression    hospitalized for 2 weeks after suicide att  . Hx of gonorrhea   . Hypertension    gestational HTN  . Kidney infection     Past Surgical History:  Procedure Laterality Date  . NO PAST SURGERIES      Family History  Problem Relation Age of Onset  . Hypertension Mother   . Hypertension Maternal Grandmother   . Hypertension Maternal Aunt   . Depression Cousin     Social History  Substance Use Topics  . Smoking status: Current Every Day Smoker    Packs/day: 0.25  . Smokeless tobacco: Never Used  . Alcohol use No    Allergies: No Known Allergies  Prescriptions Prior to Admission  Medication Sig Dispense Refill Last Dose  . hydrOXYzine (ATARAX/VISTARIL) 25 MG tablet Take 1 tablet (25 mg total) by mouth every 6 (six) hours. (Patient not taking: Reported on 08/15/2016) 12 tablet 0 Not Taking at Unknown time  . labetalol (NORMODYNE) 200 MG tablet Take 1 tablet (200 mg total) by mouth 2 (two) times daily. (Patient not taking: Reported on 08/15/2016) 60 tablet 4 Not Taking at Unknown time  . Prenatal Vit-Fe Fumarate-FA (PRENATAL VITAMIN AND MINERAL) 28-0.8 MG TABS Take 1 tablet by mouth daily. (Patient not taking: Reported on 09/07/2016) 30  tablet 11 Not Taking at Unknown time    Review of Systems  Constitutional: Negative for fever.  Gastrointestinal: Positive for abdominal pain. Negative for constipation, diarrhea, nausea and vomiting.  Genitourinary: Positive for vaginal discharge. Negative for dysuria, frequency, urgency and vaginal bleeding.   Physical Exam   Blood pressure 126/79, pulse 75, resp. rate 17, height 5\' 2"  (1.575 m), weight 127 lb (57.6 kg), last menstrual period 07/22/2016, SpO2 99 %, not currently breastfeeding.  Physical Exam  Nursing note and vitals reviewed. Constitutional: She is oriented to person, place, and time. She appears well-developed and well-nourished. No distress.  HENT:  Head: Normocephalic and atraumatic.  Cardiovascular: Normal rate.   Respiratory: Effort normal.  GI: Soft. She exhibits no distension and no mass. There is no tenderness. There is no rebound and no guarding.  Neurological: She is alert and oriented to person, place, and time.  Skin: Skin is warm and dry. No erythema.  Psychiatric: She has a normal mood and affect.    Results for orders placed or performed during the hospital encounter of 09/22/16 (from the past 24 hour(s))  Urinalysis, Routine w reflex microscopic     Status: Abnormal   Collection Time: 09/22/16  4:58 AM  Result Value Ref Range  Color, Urine YELLOW YELLOW   APPearance HAZY (A) CLEAR   Specific Gravity, Urine 1.025 1.005 - 1.030   pH 5.0 5.0 - 8.0   Glucose, UA NEGATIVE NEGATIVE mg/dL   Hgb urine dipstick MODERATE (A) NEGATIVE   Bilirubin Urine NEGATIVE NEGATIVE   Ketones, ur NEGATIVE NEGATIVE mg/dL   Protein, ur NEGATIVE NEGATIVE mg/dL   Nitrite NEGATIVE NEGATIVE   Leukocytes, UA TRACE (A) NEGATIVE   RBC / HPF 0-5 0 - 5 RBC/hpf   WBC, UA 6-30 0 - 5 WBC/hpf   Bacteria, UA RARE (A) NONE SEEN   Squamous Epithelial / LPF 6-30 (A) NONE SEEN   Mucous PRESENT   Wet prep, genital     Status: Abnormal   Collection Time: 09/22/16  5:17 AM  Result  Value Ref Range   Yeast Wet Prep HPF POC NONE SEEN NONE SEEN   Trich, Wet Prep NONE SEEN NONE SEEN   Clue Cells Wet Prep HPF POC PRESENT (A) NONE SEEN   WBC, Wet Prep HPF POC MODERATE (A) NONE SEEN   Sperm NONE SEEN     [place] Ob Transvaginal  Result Date: 09/22/2016 CLINICAL DATA:  Acute onset of abdominal cramping. Initial encounter. EXAM: TRANSVAGINAL OB ULTRASOUND TECHNIQUE: Transvaginal ultrasound was performed for complete evaluation of the gestation as well as the maternal uterus, adnexal regions, and pelvic cul-de-sac. COMPARISON:  Pelvic ultrasound performed 09/02/2016 FINDINGS: Intrauterine gestational sac: Single; visualized and normal in shape. Yolk sac:  Yes Embryo:  Yes Cardiac Activity: Yes Heart Rate: 161 bpm CRL:   15.1 mm   7 w 6 d                  US EDC: 05/05/2017 Subchorionic hemorrhage: A small amount of subchorionic hemorrhage is noted. Maternal uterus/adnexae: The uterus is otherwise unremarkable in appearance. The ovaries are within normal limits. The right ovary measures 4.5 x 3.3 x 2.8 cm, while the left ovary measures 4.5 x 1.7 x 3.2 cm. No suspicious adnexal masses are seen; there is no evidence for ovarian torsion. No free fluid is seen within the pelvic cul-de-sac. IMPRESSION: 1. Single live intrauterine pregnancy noted, with a crown-rump length of 1.5 cm, corresponding to a gestational age of [redacted] weeks 6 days. This matches the gestational age of [redacted] weeks 6 days by LMP, reflecting an estimated date of delivery of April 28, 2017. 2. Small amount of subchorionic hemorrhage noted. Electronically Signed   By: Roanna RaiderJeffery  Chang M.D.   On: 09/22/2016 05:54    MAU Course  Procedures None  MDM UA, Wet prep and US today  Tylenol given in MAU. Pain resolved.  Assessment and Plan  A: SIUP at 5826w6d Small subchorionic hemorrhage  Abdominal pain in pregnancy, first trimester  Bacterial Vaginosis  P: Discharge home Tylenol PRN for pain Rx for Flagyl given to patient  Bleeding  precautions discussed Patient advised to follow-up with [facility] as planned to start prenatal care  Patient may return to MAU as needed or if her condition were to change or worsen  Vonzella NippleJulie Wenzel, PA-C 09/22/2016, 6:07 AM

## 2016-09-22 NOTE — Discharge Instructions (Signed)
Bacterial Vaginosis °Bacterial vaginosis is an infection of the vagina. It happens when too many germs (bacteria) grow in the vagina. This infection puts you at risk for infections from sex (STIs). Treating this infection can lower your risk for some STIs. You should also treat this if you are pregnant. It can cause your baby to be born early. °Follow these instructions at home: °Medicines °· Take over-the-counter and prescription medicines only as told by your doctor. °· Take or use your antibiotic medicine as told by your doctor. Do not stop taking or using it even if you start to feel better. °General instructions °· If you your sexual partner is a woman, tell her that you have this infection. She needs to get treatment if she has symptoms. If you have a female partner, he does not need to be treated. °· During treatment: °? Avoid sex. °? Do not douche. °? Avoid alcohol as told. °? Avoid breastfeeding as told. °· Drink enough fluid to keep your pee (urine) clear or pale yellow. °· Keep your vagina and butt (rectum) clean. °? Wash the area with warm water every day. °? Wipe from front to back after you use the toilet. °· Keep all follow-up visits as told by your doctor. This is important. °Preventing this condition °· Do not douche. °· Use only warm water to wash around your vagina. °· Use protection when you have sex. This includes: °? Latex condoms. °? Dental dams. °· Limit how many people you have sex with. It is best to only have sex with the same person (be monogamous). °· Get tested for STIs. Have your partner get tested. °· Wear underwear that is cotton or lined with cotton. °· Avoid tight pants and pantyhose. This is most important in summer. °· Do not use any products that have nicotine or tobacco in them. These include cigarettes and e-cigarettes. If you need help quitting, ask your doctor. °· Do not use illegal drugs. °· Limit how much alcohol you drink. °Contact a doctor if: °· Your symptoms do not get  better, even after you are treated. °· You have more discharge or pain when you pee (urinate). °· You have a fever. °· You have pain in your belly (abdomen). °· You have pain with sex. °· Your bleed from your vagina between periods. °Summary °· This infection happens when too many germs (bacteria) grow in the vagina. °· Treating this condition can lower your risk for some infections from sex (STIs). °· You should also treat this if you are pregnant. It can cause early (premature) birth. °· Do not stop taking or using your antibiotic medicine even if you start to feel better. °This information is not intended to replace advice given to you by your health care provider. Make sure you discuss any questions you have with your health care provider. °Document Released: 12/15/2007 Document Revised: 11/21/2015 Document Reviewed: 11/21/2015 °Elsevier Interactive Patient Education © 2017 [employer] °Subchorionic Hematoma °A subchorionic hematoma is a gathering of blood between the outer wall of the placenta and the inner wall of the womb (uterus). The placenta is the organ that connects the fetus to the wall of the uterus. The placenta performs the feeding, breathing (oxygen to the fetus), and waste removal (excretory work) of the fetus. °Subchorionic hematoma is the most common abnormality found on a result from ultrasonography done during the first trimester or early second trimester of pregnancy. If there has been little or no vaginal bleeding, early small hematomas usually   shrink on their own and do not affect your baby or pregnancy. The blood is gradually absorbed over 1-2 weeks. When bleeding starts later in pregnancy or the hematoma is larger or occurs in an older pregnant woman, the outcome may not be as good. Larger hematomas may get bigger, which increases the chances for miscarriage. Subchorionic hematoma also increases the risk of premature detachment of the placenta from the uterus, preterm (premature) labor,  and stillbirth. °Follow these instructions at home: °· Stay on bed rest if your health care provider recommends this. Although bed rest will not prevent more bleeding or prevent a miscarriage, your health care provider may recommend bed rest until you are advised otherwise. °· Avoid heavy lifting (more than 10 lb [4.5 kg]), exercise, sexual intercourse, or douching as directed by your health care provider. °· Keep track of the number of pads you use each day and how soaked (saturated) they are. Write down this information. °· Do not use tampons. °· Keep all follow-up appointments as directed by your health care provider. Your health care provider may ask you to have follow-up blood tests or ultrasound tests or both. °Get help right away if: °· You have severe cramps in your stomach, back, abdomen, or pelvis. °· You have a fever. °· You pass large clots or tissue. Save any tissue for your health care provider to look at. °· Your bleeding increases or you become lightheaded, feel weak, or have fainting episodes. °This information is not intended to replace advice given to you by your health care provider. Make sure you discuss any questions you have with your health care provider. °Document Released: 06/22/2006 Document Revised: 08/13/2015 Document Reviewed: 10/04/2012 °Elsevier Interactive Patient Education © 2017 [employer] Inc. ° °

## 2016-09-24 LAB — CULTURE, OB URINE

## 2016-09-25 ENCOUNTER — Other Ambulatory Visit: Payer: Self-pay | Admitting: Student

## 2016-09-25 ENCOUNTER — Telehealth: Payer: Self-pay | Admitting: Student

## 2016-09-25 MED ORDER — CEPHALEXIN 500 MG PO CAPS: 500.0000 mg | ORAL_CAPSULE | Freq: Four times a day (QID) | ORAL | 0 refills | Status: AC

## 2016-09-25 NOTE — Telephone Encounter (Signed)
Left message on patient's cell phone to call [facility] office for test results.

## 2016-09-25 NOTE — Telephone Encounter (Signed)
Left message x2 for patient, requesting call back.  Rx for keflex sent to pharmacy this AM  Cleone SlimCaroline Parris Signer, Student-MidWife 09/25/16 8:21 PM

## 2016-11-07 ENCOUNTER — Encounter: Payer: Self-pay | Admitting: Obstetrics

## 2016-11-10 ENCOUNTER — Encounter (HOSPITAL_COMMUNITY): Payer: Self-pay | Admitting: *Deleted

## 2016-11-10 ENCOUNTER — Inpatient Hospital Stay (HOSPITAL_COMMUNITY)
Admission: AD | Admit: 2016-11-10 | Discharge: 2016-11-10 | Disposition: A | Discharge status: Home / Self Care | Payer: Medicaid Other | Source: Ambulatory Visit | Attending: Obstetrics & Gynecology | Admitting: Obstetrics & Gynecology

## 2016-11-10 DIAGNOSIS — F17210 Nicotine dependence, cigarettes, uncomplicated: Secondary | ICD-10-CM | POA: Insufficient documentation

## 2016-11-10 DIAGNOSIS — N898 Other specified noninflammatory disorders of vagina: Secondary | ICD-10-CM | POA: Diagnosis present

## 2016-11-10 DIAGNOSIS — B373 Candidiasis of vulva and vagina: Secondary | ICD-10-CM

## 2016-11-10 DIAGNOSIS — O26892 Other specified pregnancy related conditions, second trimester: Secondary | ICD-10-CM | POA: Insufficient documentation

## 2016-11-10 DIAGNOSIS — O99332 Smoking (tobacco) complicating pregnancy, second trimester: Secondary | ICD-10-CM | POA: Diagnosis not present

## 2016-11-10 DIAGNOSIS — B3731 Acute candidiasis of vulva and vagina: Secondary | ICD-10-CM

## 2016-11-10 DIAGNOSIS — O23592 Infection of other part of genital tract in pregnancy, second trimester: Secondary | ICD-10-CM | POA: Diagnosis not present

## 2016-11-10 DIAGNOSIS — O9989 Other specified diseases and conditions complicating pregnancy, childbirth and the puerperium: Secondary | ICD-10-CM | POA: Diagnosis not present

## 2016-11-10 DIAGNOSIS — R030 Elevated blood-pressure reading, without diagnosis of hypertension: Secondary | ICD-10-CM | POA: Insufficient documentation

## 2016-11-10 DIAGNOSIS — R51 Headache: Secondary | ICD-10-CM | POA: Insufficient documentation

## 2016-11-10 DIAGNOSIS — Z3A15 15 weeks gestation of pregnancy: Secondary | ICD-10-CM | POA: Diagnosis not present

## 2016-11-10 LAB — URINALYSIS, ROUTINE W REFLEX MICROSCOPIC
Bilirubin Urine: NEGATIVE
GLUCOSE, UA: 50 mg/dL — AB
KETONES UR: NEGATIVE mg/dL
Leukocytes, UA: NEGATIVE
NITRITE: POSITIVE — AB
PH: 5 (ref 5.0–8.0)
Protein, ur: NEGATIVE mg/dL
SPECIFIC GRAVITY, URINE: 1.029 (ref 1.005–1.030)

## 2016-11-10 LAB — WET PREP, GENITAL
Clue Cells Wet Prep HPF POC: NONE SEEN
SPERM: NONE SEEN
TRICH WET PREP: NONE SEEN
YEAST WET PREP: NONE SEEN

## 2016-11-10 LAB — CBC WITH DIFFERENTIAL/PLATELET
BASOS PCT: 0 %
Basophils Absolute: 0 10*3/uL (ref 0.0–0.1)
EOS ABS: 0.2 10*3/uL (ref 0.0–0.7)
EOS PCT: 3 %
HCT: 31.9 % — ABNORMAL LOW (ref 36.0–46.0)
HEMOGLOBIN: 10.6 g/dL — AB (ref 12.0–15.0)
Lymphocytes Relative: 20 %
Lymphs Abs: 1.4 10*3/uL (ref 0.7–4.0)
MCH: 28 pg (ref 26.0–34.0)
MCHC: 33.2 g/dL (ref 30.0–36.0)
MCV: 84.2 fL (ref 78.0–100.0)
MONO ABS: 0.2 10*3/uL (ref 0.1–1.0)
Monocytes Relative: 2 %
Neutro Abs: 5.5 10*3/uL (ref 1.7–7.7)
Neutrophils Relative %: 75 %
PLATELETS: 292 10*3/uL (ref 150–400)
RBC: 3.79 MIL/uL — ABNORMAL LOW (ref 3.87–5.11)
RDW: 14.2 % (ref 11.5–15.5)
WBC: 7.2 10*3/uL (ref 4.0–10.5)

## 2016-11-10 MED ORDER — TERCONAZOLE 0.4 % VA CREA: 1.0000 | TOPICAL_CREAM | Freq: Every day | VAGINAL | 0 refills | Status: DC

## 2016-11-10 NOTE — Discharge Instructions (Signed)

## 2016-11-10 NOTE — MAU Note (Signed)
Urine in lab 

## 2016-11-10 NOTE — MAU Note (Signed)
States having headaches with floaters--rating 8/10 +lower abdominal pain--rating pain 5/10--cramping Has not taken any medication for this pain States she feels like her blood pressure is up Odorous discharge--clear and watery and patient expresses concern for STDs

## 2016-11-10 NOTE — [facility] Provider Note (Signed)
History  Chief Complaint:  Vaginal Discharge; Headache; and Abdominal Pain  Sharon Johnston is a 26 y.o. [id number] female at [redacted]w[redacted]d presenting with vaginal discharge, headaches and eye spots.  She previously took HCTZ, but has not been taking it for 61yr. She reports seeing some black dots in her eyes, which is not a new symptom for her. She reports headaches yesterday, that has gotten worse. She has not taken anything for the HA.   She also reports watery yellow/white discharge vaginal discharge with an odor. She reports like a fishy odor. No vaginal bleeding. Most recent sexual encounter was 3 months ago. She denies dizziness, fatigue, chest pain, SOB, n/v/d/c, urinary symptoms.  History is significant for HTN in pregnancy and required IOL for HTN with severe features in 3rd pregnancy.  She plans to establish prenatal care at the [facility] on Monday.  Obstetrical History: OB History    Gravida Para Term Preterm AB Living   6 3 3  0 2 3   SAB TAB Ectopic Multiple Live Births   1 1 0 0 3      Past Medical History: Past Medical History:  Diagnosis Date  . Chlamydia   . Depression    hospitalized for 2 weeks after suicide att  . Hx of gonorrhea   . Hypertension    gestational HTN  . Kidney infection     Past Surgical History: Past Surgical History:  Procedure Laterality Date  . NO PAST SURGERIES      Social History: Social History   Social History  . Marital status: Single    Spouse name: N/A  . Number of children: N/A  . Years of education: N/A   Social History Main Topics  . Smoking status: Current Every Day Smoker    Packs/day: 0.25  . Smokeless tobacco: Never Used  . Alcohol use No  . Drug use: No  . Sexual activity: Yes    Birth control/ protection: None   Other Topics Concern  . None   Social History Narrative  . None    Allergies: No Known Allergies  Prescriptions Prior to Admission  Medication Sig Dispense Refill Last Dose  . metroNIDAZOLE (FLAGYL) 500  MG tablet Take 1 tablet (500 mg total) by mouth 2 (two) times daily. 14 tablet 0 More than a month at Unknown time    Review of Systems  Pertinent pos/neg as indicated in HPI  Physical Exam  Blood pressure 125/69, pulse 88, temperature 98.4 F (36.9 C), temperature source Oral, resp. rate 16, weight 62.2 kg (137 lb 1.9 oz), last menstrual period 07/22/2016, SpO2 100 %. General appearance: alert, cooperative and no distress  Neuro: PERRL, EOMI, CN I-XII intact, no focal deficits Lungs: normal work of breathing Abdomen: gravid, soft, non-tender Extremities: no edema  Pelvic exam: VULVA: normal appearing vulva with no masses, tenderness or lesions,  VAGINA: vaginal tenderness with speculum insertion, vaginal discharge - white, creamy, curdlike discharge on vaginal sidewalls  CERVIX: cervical discharge present - white, creamy and mucoid, posteriorly oriented- unable to fully visualize, no CMT.   UTERUS: uterus is normal size, shape, consistency and nontender,  ADNEXA: normal adnexa in size, nontender and no masses.  Cultures/Specimens: G/C & Wet Prep blind swabs Dilation: Closed Cervical Position: Posterior Exam by:: Luna Kitchens, CN  MAU Course  MDM CBC Pelvic exam with wet mount and G/C swabs MAU observation Repeat BP checks Patient verbalized agreement and understanding with the assessment and plan.   Labs:  Results for orders  placed or performed during the hospital encounter of 11/10/16 (from the past 24 hour(s))  CBC with Differential/Platelet     Status: Abnormal   Collection Time: 11/10/16  6:06 PM  Result Value Ref Range   WBC 7.2 4.0 - 10.5 K/uL   RBC 3.79 (L) 3.87 - 5.11 MIL/uL   Hemoglobin 10.6 (L) 12.0 - 15.0 g/dL   HCT 93.8 (L) 18.2 - 99.3 %   MCV 84.2 78.0 - 100.0 fL   MCH 28.0 26.0 - 34.0 pg   MCHC 33.2 30.0 - 36.0 g/dL   RDW 71.6 96.7 - 89.3 %   Platelets 292 150 - 400 K/uL   Neutrophils Relative % 75 %   Neutro Abs 5.5 1.7 - 7.7 K/uL   Lymphocytes  Relative 20 %   Lymphs Abs 1.4 0.7 - 4.0 K/uL   Monocytes Relative 2 %   Monocytes Absolute 0.2 0.1 - 1.0 K/uL   Eosinophils Relative 3 %   Eosinophils Absolute 0.2 0.0 - 0.7 K/uL   Basophils Relative 0 %   Basophils Absolute 0.0 0.0 - 0.1 K/uL  Wet prep, genital     Status: Abnormal   Collection Time: 11/10/16  6:44 PM  Result Value Ref Range   Yeast Wet Prep HPF POC NONE SEEN NONE SEEN   Trich, Wet Prep NONE SEEN NONE SEEN   Clue Cells Wet Prep HPF POC NONE SEEN NONE SEEN   WBC, Wet Prep HPF POC FEW (A) NONE SEEN   Sperm NONE SEEN     Imaging:  none Assessment and Plan   A: Sharon Johnston is a 25yo [id number] at [redacted]w[redacted]d presenting with elevated BP, HA and vaginal discharge. Initial BP was elevated, but wnl on recheck. Differential for HA is broad, but unlikely to be related to BP given normal value on repeat. Normal neurological exam reassuring, etiology of HA is likely benign. Eye spots are likely eye floaters given benign neuro exam and chronic nature. History and physical exam suggestive of candidal vaginitis. No yeast visualized on wet prep, but this was a blind swab and clinical suspicion is still high. G/C swab pending.    P: Candidal vaginitis: Prescribed topical terazol cream. Pt advised to have pelvic rest until resolution of symptoms. Return precautions given regarding new, worsening symptoms or no resolution of symptoms. Follow up with [facility] on Monday.  Headaches: Patient advised to take tylenol for pain relief. Pt instructed to stay adequately hydrated. Return precautions given regarding new or worsening symptoms. Patient advised to establish care with PCP for HTN management. Pt advised to follow up with opthamologist if persistent complaint of eye floaters.   Dannette Barbara, Medical Student 8/23/20187:15 PM  I confirm that I have verified the information documented in the student's note and that I have also personally reperformed the physical exam and all medical decision making  activities.  Luna Kitchens CNM

## 2016-11-11 LAB — HIV ANTIBODY (ROUTINE TESTING W REFLEX): HIV Screen 4th Generation wRfx: NONREACTIVE

## 2016-11-11 LAB — GC/CHLAMYDIA PROBE AMP (~~LOC~~) NOT AT ARMC
Chlamydia: NEGATIVE
Neisseria Gonorrhea: NEGATIVE

## 2016-11-16 IMAGING — US US OB COMP LESS 14 WK
1 series · 14 of 28 positions shown · non-contrast
Comparison: 02/17/2014

CLINICAL DATA: Positive pregnancy test, abdominal pain

EXAM:
OBSTETRIC <14 WK US AND TRANSVAGINAL OB US
TECHNIQUE: Both transabdominal and transvaginal ultrasound examinations were
performed for complete evaluation of the gestation as well as the
maternal uterus, adnexal regions, and pelvic cul-de-sac.
Transvaginal technique was performed to assess early pregnancy.

[Series 1: us ob comp less 14 wk · 0.22mm/px · 14 of 43 slices shown]
[im 2/43]
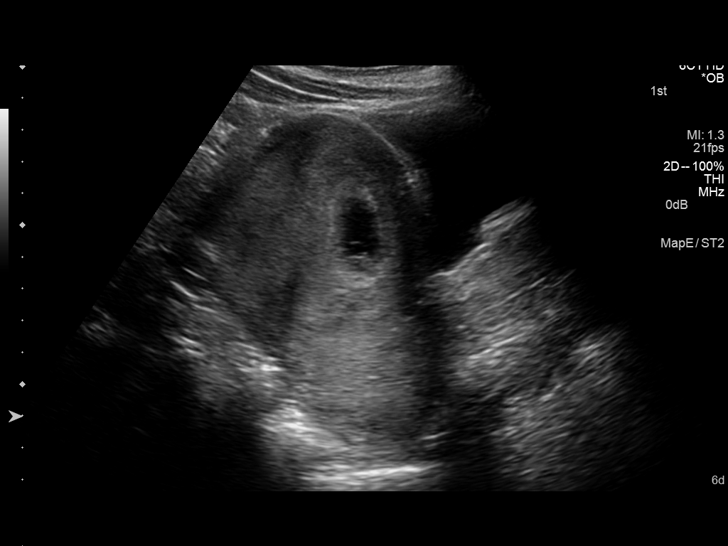
[im 5/43]
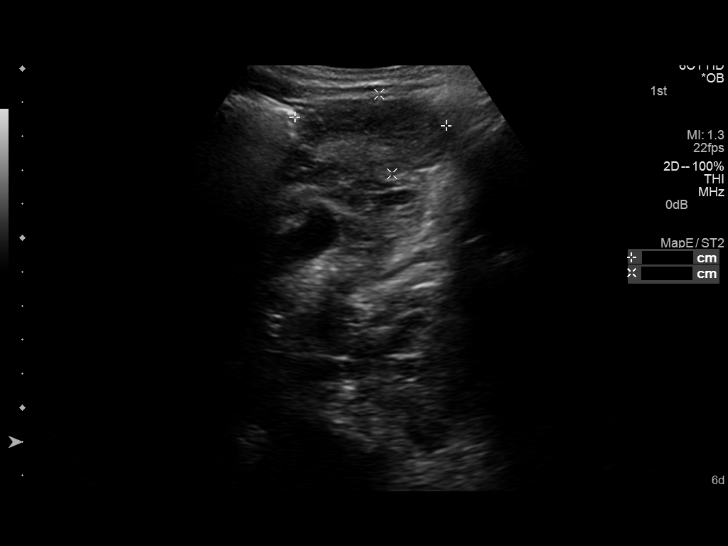
[im 8/43]
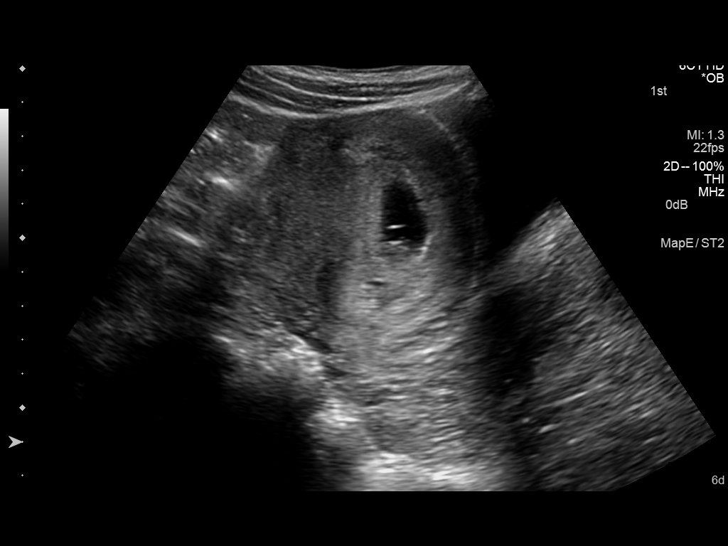
[im 11/43]
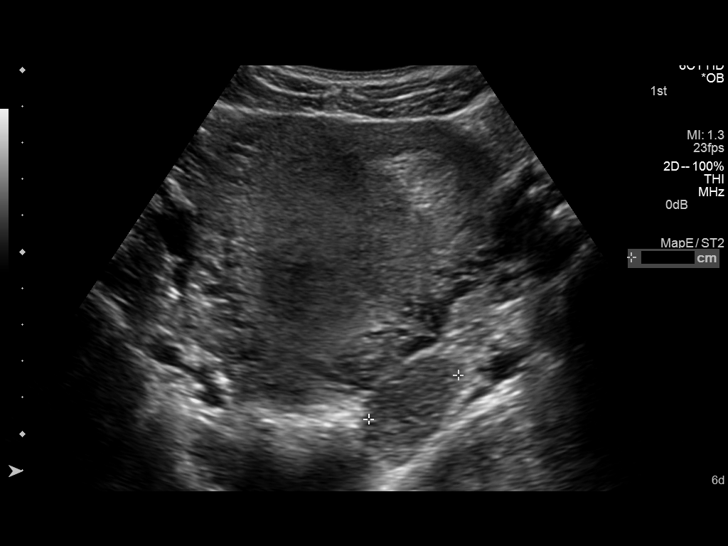
[im 15/43]
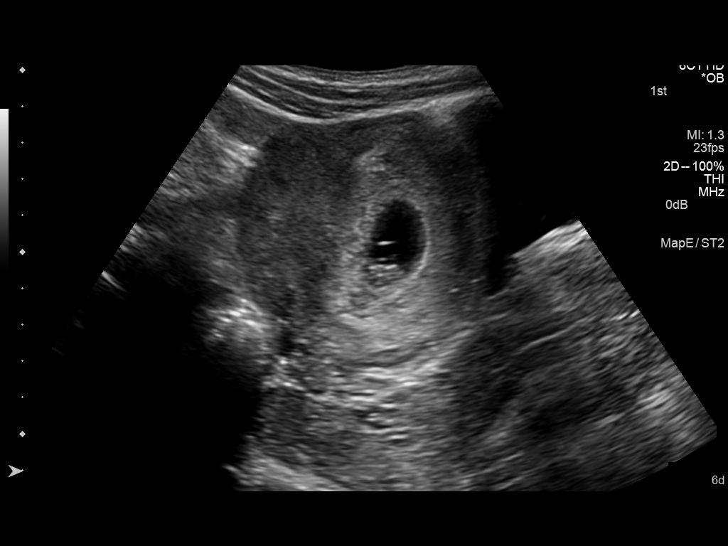
[im 18/43]
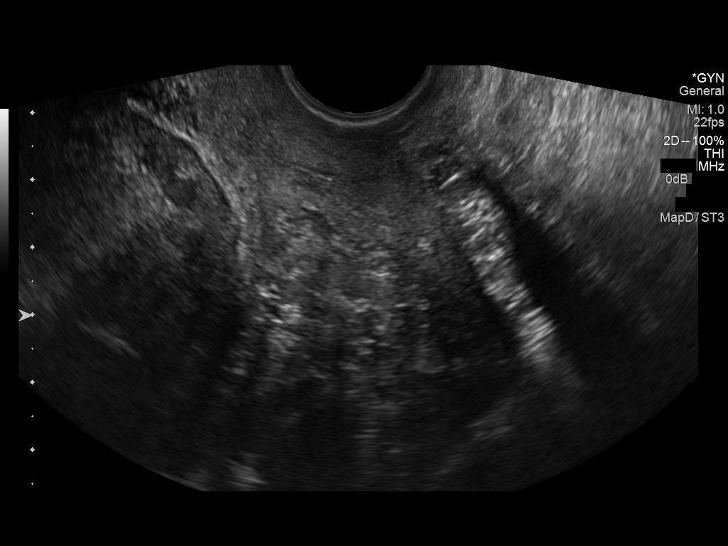
[im 21/43]
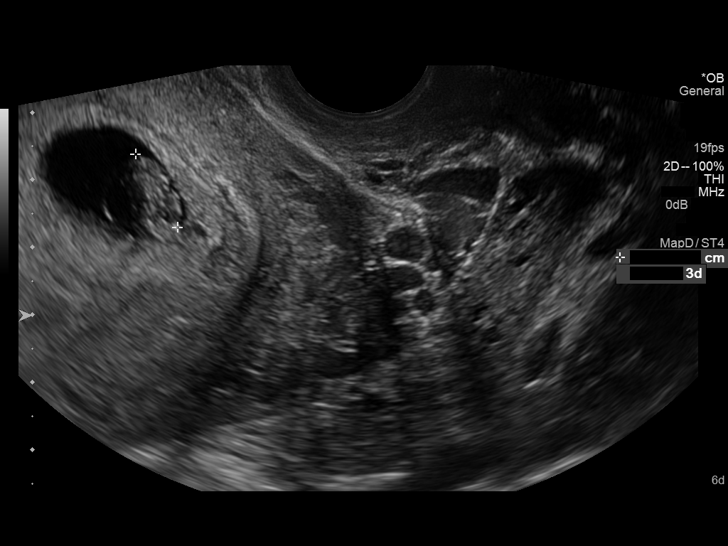
[im 24/43]
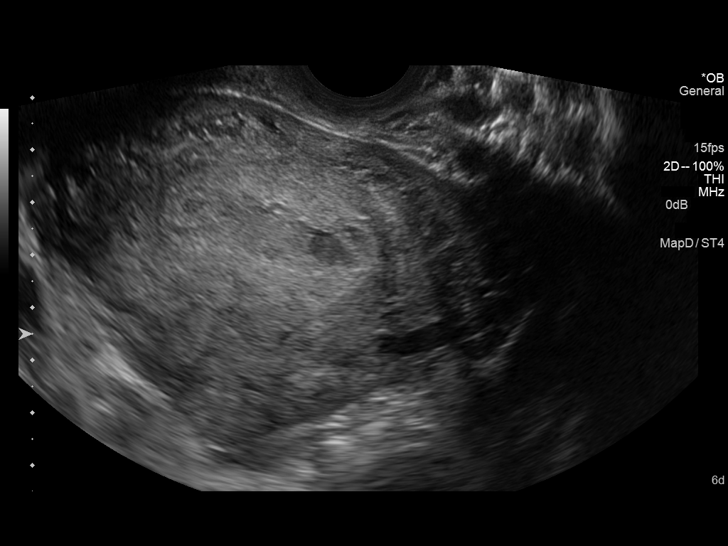
[im 27/43]
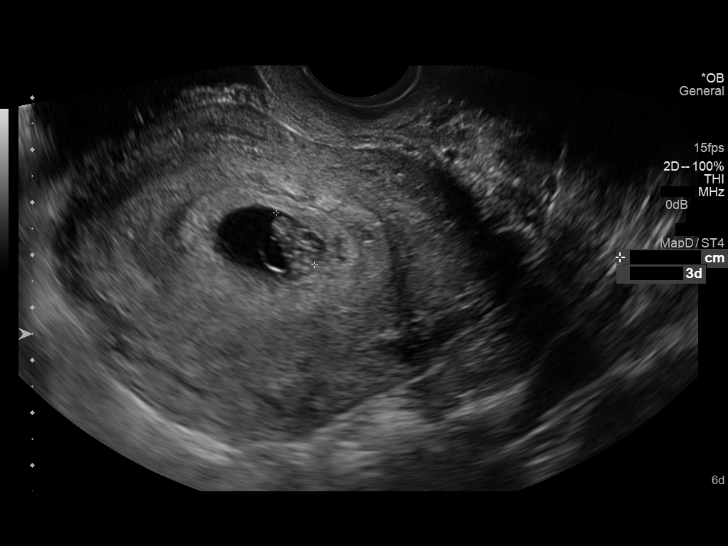
[im 30/43]
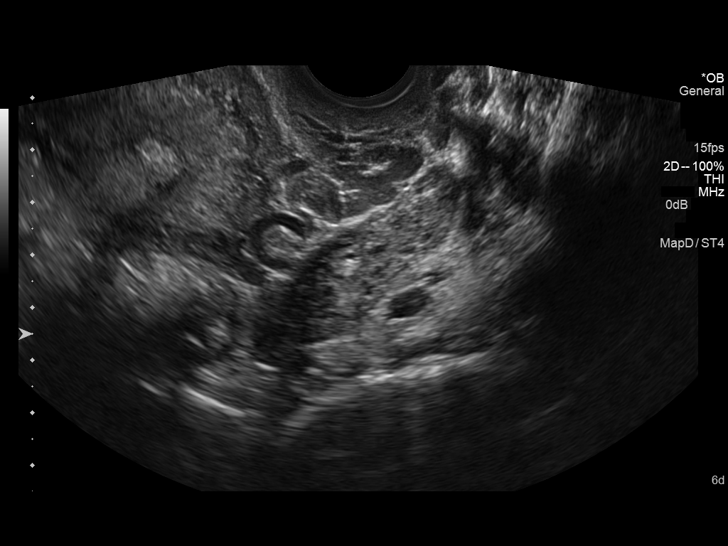
[im 33/43]
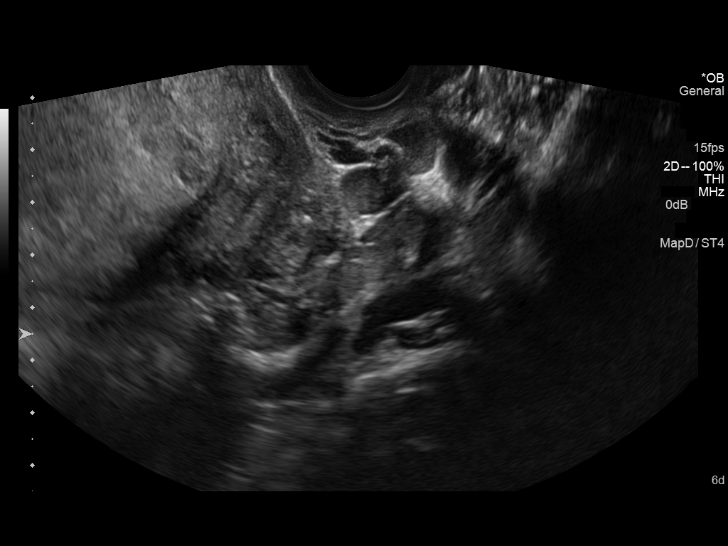
[im 36/43]
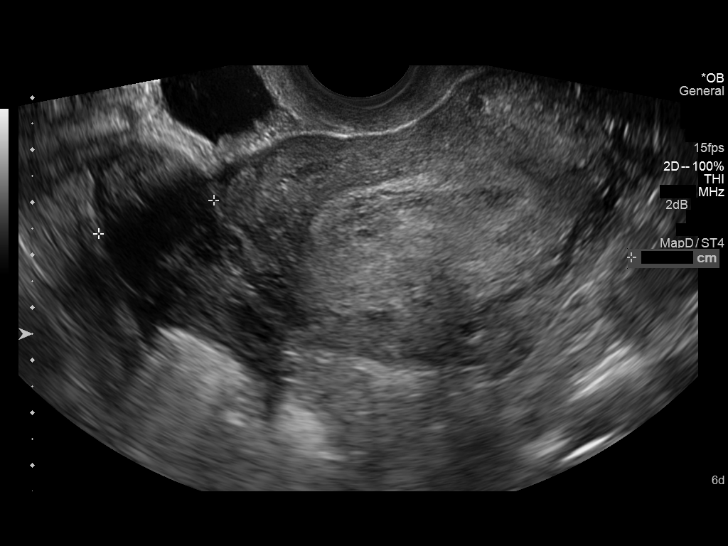
[im 39/43]
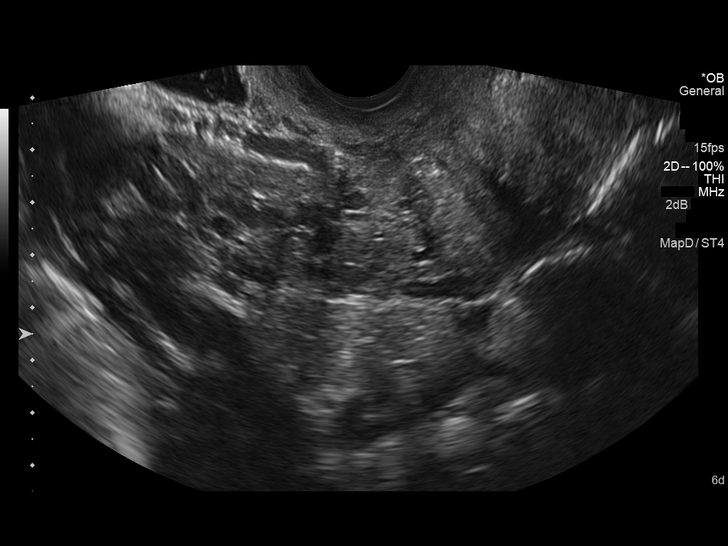
[im 43/43]
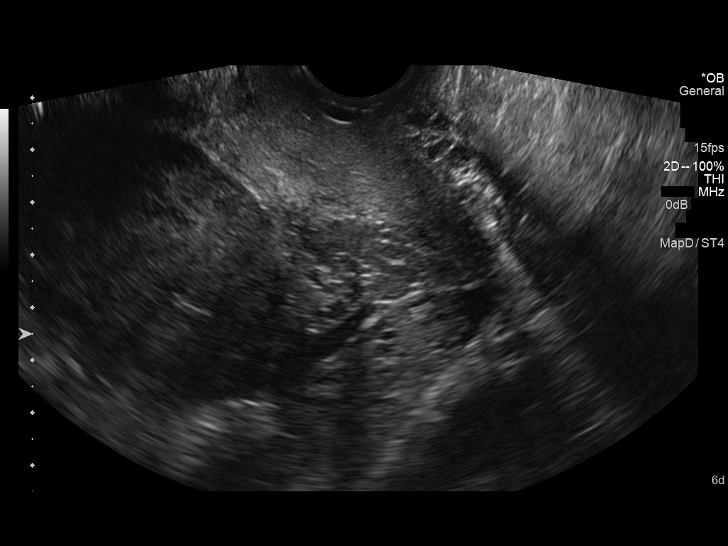

[14 of 28 positions shown; findings below may reference images not displayed]

FINDINGS: Intrauterine gestational sac: Visualized/normal in shape.

Yolk sac:  Visualized

Embryo:  Visualized

Cardiac Activity: Visualized

Heart Rate:  137 bpm

CRL:   12  mm   7 w 3d                  US EDC: 10/24/14

Maternal uterus/adnexae: The ovaries are normal.  No free fluid.
IMPRESSION: Intrauterine gestational sac, yolk sac, and fetal pole identified
with best assigned gestational age by today's crown-rump length of 7
weeks 3 days. No acute abnormality.

## 2016-11-21 ENCOUNTER — Inpatient Hospital Stay (HOSPITAL_COMMUNITY)
Admission: AD | Admit: 2016-11-21 | Discharge: 2016-11-21 | Disposition: A | Discharge status: Home / Self Care | Payer: Medicaid Other | Source: Ambulatory Visit | Attending: Obstetrics & Gynecology | Admitting: Obstetrics & Gynecology

## 2016-11-21 ENCOUNTER — Encounter (HOSPITAL_COMMUNITY): Payer: Self-pay | Admitting: *Deleted

## 2016-11-21 DIAGNOSIS — G43909 Migraine, unspecified, not intractable, without status migrainosus: Secondary | ICD-10-CM | POA: Diagnosis not present

## 2016-11-21 DIAGNOSIS — R51 Headache: Secondary | ICD-10-CM

## 2016-11-21 DIAGNOSIS — Z3A17 17 weeks gestation of pregnancy: Secondary | ICD-10-CM | POA: Diagnosis not present

## 2016-11-21 DIAGNOSIS — O26892 Other specified pregnancy related conditions, second trimester: Secondary | ICD-10-CM | POA: Diagnosis not present

## 2016-11-21 DIAGNOSIS — O219 Vomiting of pregnancy, unspecified: Secondary | ICD-10-CM | POA: Insufficient documentation

## 2016-11-21 DIAGNOSIS — O2342 Unspecified infection of urinary tract in pregnancy, second trimester: Secondary | ICD-10-CM | POA: Diagnosis not present

## 2016-11-21 DIAGNOSIS — O162 Unspecified maternal hypertension, second trimester: Secondary | ICD-10-CM | POA: Diagnosis not present

## 2016-11-21 DIAGNOSIS — O99332 Smoking (tobacco) complicating pregnancy, second trimester: Secondary | ICD-10-CM | POA: Insufficient documentation

## 2016-11-21 LAB — URINALYSIS, ROUTINE W REFLEX MICROSCOPIC
Bilirubin Urine: NEGATIVE
GLUCOSE, UA: NEGATIVE mg/dL
Ketones, ur: NEGATIVE mg/dL
Nitrite: POSITIVE — AB
PH: 5 (ref 5.0–8.0)
PROTEIN: NEGATIVE mg/dL
Specific Gravity, Urine: 1.028 (ref 1.005–1.030)

## 2016-11-21 MED ORDER — DIPHENHYDRAMINE HCL 50 MG/ML IJ SOLN
25.0000 mg | Freq: Once | INTRAMUSCULAR | Status: AC
  Administered 2016-11-21: 25 mg via INTRAVENOUS
  Filled 2016-11-21: qty 1

## 2016-11-21 MED ORDER — SODIUM CHLORIDE 0.9 % IV BOLUS (SEPSIS)
1000.0000 mL | Freq: Once | INTRAVENOUS | Status: AC
  Administered 2016-11-21: 1000 mL via INTRAVENOUS

## 2016-11-21 MED ORDER — METOCLOPRAMIDE HCL 5 MG/ML IJ SOLN
10.0000 mg | Freq: Once | INTRAMUSCULAR | Status: AC
  Administered 2016-11-21: 10 mg via INTRAVENOUS
  Filled 2016-11-21: qty 2

## 2016-11-21 MED ORDER — CEPHALEXIN 500 MG PO CAPS: 500.0000 mg | ORAL_CAPSULE | Freq: Four times a day (QID) | ORAL | 0 refills | Status: AC

## 2016-11-21 MED ORDER — DEXAMETHASONE SODIUM PHOSPHATE 10 MG/ML IJ SOLN
10.0000 mg | Freq: Once | INTRAMUSCULAR | Status: AC
  Administered 2016-11-21: 10 mg via INTRAVENOUS
  Filled 2016-11-21: qty 1

## 2016-11-21 MED ORDER — METOCLOPRAMIDE HCL 10 MG PO TABS: 10.0000 mg | ORAL_TABLET | Freq: Four times a day (QID) | ORAL | 0 refills | Status: DC | PRN

## 2016-11-21 NOTE — Discharge Instructions (Signed)
Migraine Headache A migraine headache is an intense, throbbing pain on one side or both sides of the head. Migraines may also cause other symptoms, such as nausea, vomiting, and sensitivity to light and noise. What are the causes? Doing or taking certain things may also trigger migraines, such as:  Alcohol.  Smoking.  Medicines, such as: ? Medicine used to treat chest pain (nitroglycerine). ? Birth control pills. ? Estrogen pills. ? Certain blood pressure medicines.  Aged cheeses, chocolate, or caffeine.  Foods or drinks that contain nitrates, glutamate, aspartame, or tyramine.  Physical activity.  Other things that may trigger a migraine include:  Menstruation.  Pregnancy.  Hunger.  Stress, lack of sleep, too much sleep, or fatigue.  Weather changes.  What increases the risk? The following factors may make you more likely to experience migraine headaches:  Age. Risk increases with age.  Family history of migraine headaches.  Being Caucasian.  Depression and anxiety.  Obesity.  Being a woman.  Having a hole in the heart (patent foramen ovale) or other heart problems.  What are the signs or symptoms? The main symptom of this condition is pulsating or throbbing pain. Pain may:  Happen in any area of the head, such as on one side or both sides.  Interfere with daily activities.  Get worse with physical activity.  Get worse with exposure to bright lights or loud noises.  Other symptoms may include:  Nausea.  Vomiting.  Dizziness.  General sensitivity to bright lights, loud noises, or smells.  Before you get a migraine, you may get warning signs that a migraine is developing (aura). An aura may include:  Seeing flashing lights or having blind spots.  Seeing bright spots, halos, or zigzag lines.  Having tunnel vision or blurred vision.  Having numbness or a tingling feeling.  Having trouble talking.  Having muscle weakness.  How is this  diagnosed? A migraine headache can be diagnosed based on:  Your symptoms.  A physical exam.  Tests, such as CT scan or MRI of the head. These imaging tests can help rule out other causes of headaches.  Taking fluid from the spine (lumbar puncture) and analyzing it (cerebrospinal fluid analysis, or CSF analysis).  How is this treated? A migraine headache is usually treated with medicines that:  Relieve pain.  Relieve nausea.  Prevent migraines from coming back.  Treatment may also include:  Acupuncture.  Lifestyle changes like avoiding foods that trigger migraines.  Follow these instructions at home: Medicines  Take over-the-counter and prescription medicines only as told by your health care provider.  Do not drive or use heavy machinery while taking prescription pain medicine.  To prevent or treat constipation while you are taking prescription pain medicine, your health care provider may recommend that you: ? Drink enough fluid to keep your urine clear or pale yellow. ? Take over-the-counter or prescription medicines. ? Eat foods that are high in fiber, such as fresh fruits and vegetables, whole grains, and beans. ? Limit foods that are high in fat and processed sugars, such as fried and sweet foods. Lifestyle  Avoid alcohol use.  Do not use any products that contain nicotine or tobacco, such as cigarettes and e-cigarettes. If you need help quitting, ask your health care provider.  Get at least 8 hours of sleep every night.  Limit your stress. General instructions   Keep a journal to find out what may trigger your migraine headaches. For example, write down: ? What you eat and  drink. ? How much sleep you get. ? Any change to your diet or medicines.  If you have a migraine: ? Avoid things that make your symptoms worse, such as bright lights. ? It may help to lie down in a dark, quiet room. ? Do not drive or use heavy machinery. ? Ask your health care provider  what activities are safe for you while you are experiencing symptoms.  Keep all follow-up visits as told by your health care provider. This is important. Contact a health care provider if:  You develop symptoms that are different or more severe than your usual migraine symptoms. Get help right away if:  Your migraine becomes severe.  You have a fever.  You have a stiff neck.  You have vision loss.  Your muscles feel weak or like you cannot control them.  You start to lose your balance often.  You develop trouble walking.  You faint. This information is not intended to replace advice given to you by your health care provider. Make sure you discuss any questions you have with your health care provider. Document Released: 03/07/2005 Document Revised: 09/25/2015 Document Reviewed: 08/24/2015 Elsevier Interactive Patient Education  2017 [employer] Inc[employer] Morning Sickness Morning sickness is when you feel sick to your stomach (nauseous) during pregnancy. You may feel sick to your stomach and throw up (vomit). You may feel sick in the morning, but you can feel this way any time of day. Some women feel very sick to their stomach and cannot stop throwing up (hyperemesis gravidarum). Follow these instructions at home:  Only take medicines as told by your doctor.  Take multivitamins as told by your doctor. Taking multivitamins before getting pregnant can stop or lessen the harshness of morning sickness.  Eat dry toast or unsalted crackers before getting out of bed.  Eat 5 to 6 small meals a day.  Eat dry and bland foods like rice and baked potatoes.  Do not drink liquids with meals. Drink between meals.  Do not eat greasy, fatty, or spicy foods.  Have someone cook for you if the smell of food causes you to feel sick or throw up.  If you feel sick to your stomach after taking prenatal vitamins, take them at night or with a snack.  Eat protein when you need a snack (nuts, yogurt,  cheese).  Eat unsweetened gelatins for dessert.  Wear a bracelet used for sea sickness (acupressure wristband).  Go to a doctor that puts thin needles into certain body points (acupuncture) to improve how you feel.  Do not smoke.  Use a humidifier to keep the air in your house free of odors.  Get lots of fresh air. Contact a doctor if:  You need medicine to feel better.  You feel dizzy or lightheaded.  You are losing weight. Get help right away if:  You feel very sick to your stomach and cannot stop throwing up.  You pass out (faint). This information is not intended to replace advice given to you by your health care provider. Make sure you discuss any questions you have with your health care provider. Document Released: 04/14/2004 Document Revised: 08/13/2015 Document Reviewed: 08/22/2012 Elsevier Interactive Patient Education  2017 [employer]

## 2016-11-21 NOTE — MAU Provider Note (Signed)
Chief Complaint: Headache and Hypertension   First Provider Initiated Contact with Patient 11/21/16 1919      SUBJECTIVE HPI: Sharon Johnston is a 26 y.o. [id number] at 866w3d by LMP who presents to maternity admissions reporting onset of severe h/a today, associated with nausea and vomiting.  She has hx of HTN but has not taken medications as prescribed, so she worried that her h/a was caused by BP. She has not taken any pain medication for her h/a. She reports she is drinking enough fluids.  She [facility] with her intake visit but they plan to transfer [facility] for high risk pregnancy due to chronic hypertension.  She has hx of migraines and reports this pain is similar to previous migraines. She denies vaginal bleeding, vaginal itching/burning, urinary symptoms, dizziness, n/v, or fever/chills.     HPI  Past Medical History:  Diagnosis Date  . Chlamydia   . Depression    hospitalized for 2 weeks after suicide att  . Hx of gonorrhea   . Hypertension    gestational HTN  . Kidney infection    Past Surgical History:  Procedure Laterality Date  . NO PAST SURGERIES     Social History   Social History  . Marital status: Single    Spouse name: N/A  . Number of children: N/A  . Years of education: N/A   Occupational History  . Not on file.   Social History Main Topics  . Smoking status: Current Every Day Smoker    Packs/day: 0.25  . Smokeless tobacco: Never Used  . Alcohol use No  . Drug use: No  . Sexual activity: Yes    Birth control/ protection: None   Other Topics Concern  . Not on file   Social History Narrative  . No narrative on file   No current facility-administered medications on file prior to encounter.    No current outpatient prescriptions on file prior to encounter.   No Known Allergies  ROS:  Review of Systems  Constitutional: Negative for chills, fatigue and fever.  Eyes: Negative for visual disturbance.  Respiratory:  Negative for shortness of breath.   Cardiovascular: Negative for chest pain.  Gastrointestinal: Negative for abdominal pain, nausea and vomiting.  Genitourinary: Negative for difficulty urinating, dysuria, flank pain, pelvic pain, vaginal bleeding, vaginal discharge and vaginal pain.  Neurological: Positive for headaches. Negative for dizziness.  Psychiatric/Behavioral: Negative.      I have reviewed patient's Past Medical Hx, Surgical Hx, Family Hx, Social Hx, medications and allergies.   Physical Exam   Patient Vitals for the past 24 hrs:  BP Temp Temp src Pulse Resp SpO2  11/21/16 2105 119/67 - - 75 16 -  11/21/16 1853 119/65 98.3 F (36.8 C) Oral 78 16 97 %   Constitutional: Well-developed, well-nourished female in no acute distress.  Cardiovascular: normal rate Respiratory: normal effort GI: Abd soft, non-tender. Pos BS x 4 MS: Extremities nontender, no edema, normal ROM Neurologic: Alert and oriented x 4.  GU: Neg CVAT.  FHT 145 by doppler  Fundal height c/w LMP dates of 17 weeks  LAB RESULTS Results for orders placed or performed during the hospital encounter of 11/21/16 (from the past 24 hour(s))  Urinalysis, Routine w reflex microscopic     Status: Abnormal   Collection Time: 11/21/16  6:45 PM  Result Value Ref Range   Color, Urine YELLOW YELLOW   APPearance HAZY (A) CLEAR   Specific Gravity, Urine  1.028 1.005 - 1.030   pH 5.0 5.0 - 8.0   Glucose, UA NEGATIVE NEGATIVE mg/dL   Hgb urine dipstick MODERATE (A) NEGATIVE   Bilirubin Urine NEGATIVE NEGATIVE   Ketones, ur NEGATIVE NEGATIVE mg/dL   Protein, ur NEGATIVE NEGATIVE mg/dL   Nitrite POSITIVE (A) NEGATIVE   Leukocytes, UA TRACE (A) NEGATIVE   RBC / HPF 0-5 0 - 5 RBC/hpf   WBC, UA 6-30 0 - 5 WBC/hpf   Bacteria, UA MANY (A) NONE SEEN   Squamous Epithelial / LPF 6-30 (A) NONE SEEN   Mucus PRESENT    MDM BP wnl today without meds.  Will treat for migraine with h/a cocktail (NS x 1000 ml, Reglan 10 mg IV,  Decadron 10 mg IV, Benadryl 25 mg IV).  Urine c/w with UTI in pregnancy. UC sent. Report to Donette Larry, CNM.  Sharen Counter Certified Nurse-Midwife 11/21/2016  9:34 PM  HA completely resolved. No emesis. Stable for discharge home.   A/P:  1. Migraine without status migrainosus, not intractable, unspecified migraine type   2. Headache in pregnancy, antepartum, second trimester   3. UTI (urinary tract infection) during pregnancy, second trimester   4. [redacted] weeks gestation of pregnancy    Discharge home Follow up at [facility] Rx Reglan Rx Keflex Return for worsening sx  Allergies as of 11/21/2016   No Known Allergies     Medication List    STOP taking these medications   terconazole 0.4 % vaginal cream Commonly known as:  TERAZOL 7     TAKE these medications   cephALEXin 500 MG capsule Commonly known as:  KEFLEX Take 1 capsule (500 mg total) by mouth 4 (four) times daily.   metoCLOPramide 10 MG tablet Commonly known as:  REGLAN Take 1 tablet (10 mg total) by mouth every 6 (six) hours as needed for nausea.            Discharge Care Instructions        Start     Ordered   11/21/16 0000  Discharge patient    Question Answer Comment  Discharge disposition 01-Home or Self Care   Discharge patient date 11/21/2016      11/21/16 2058   11/21/16 0000  metoCLOPramide (REGLAN) 10 MG tablet  Every 6 hours PRN    Question:  Supervising Provider  Answer:  Lazaro Arms   11/21/16 2058   11/21/16 0000  cephALEXin (KEFLEX) 500 MG capsule  4 times daily    Question:  Supervising Provider  Answer:  Lazaro Arms   11/21/16 2058     Donette Larry, CNM  11/21/2016 9:36 PM

## 2016-11-21 NOTE — MAU Note (Signed)
Patient has had a headache since this morning and states "I can't eat, I can't lay down, it feels like I'm about to fall out."  Pt ate a meal about an hour ago at a cookout and felt nauseated, but did not vomit.  Pt has not taken any medication to help the pain.  Denies pain anywhere else other than head.  Pt states she has high blood pressure, but ran out of her medication.  Last took meds last year.

## 2016-11-24 LAB — CULTURE, OB URINE

## 2016-12-06 ENCOUNTER — Encounter: Payer: Self-pay | Admitting: Obstetrics

## 2016-12-06 ENCOUNTER — Other Ambulatory Visit (HOSPITAL_COMMUNITY)
Admission: RE | Admit: 2016-12-06 | Discharge: 2016-12-06 | Disposition: A | Discharge status: Home / Self Care | Payer: Medicaid Other | Source: Ambulatory Visit | Attending: Obstetrics | Admitting: Obstetrics

## 2016-12-06 ENCOUNTER — Ambulatory Visit (INDEPENDENT_AMBULATORY_CARE_PROVIDER_SITE_OTHER): Payer: Medicaid Other | Admitting: Obstetrics

## 2016-12-06 VITALS — BP 122/78 | HR 84 | Wt 137.2 lb

## 2016-12-06 DIAGNOSIS — R12 Heartburn: Secondary | ICD-10-CM

## 2016-12-06 DIAGNOSIS — Z3480 Encounter for supervision of other normal pregnancy, unspecified trimester: Secondary | ICD-10-CM | POA: Diagnosis not present

## 2016-12-06 DIAGNOSIS — Z3482 Encounter for supervision of other normal pregnancy, second trimester: Secondary | ICD-10-CM | POA: Diagnosis not present

## 2016-12-06 DIAGNOSIS — O26892 Other specified pregnancy related conditions, second trimester: Secondary | ICD-10-CM

## 2016-12-06 DIAGNOSIS — O26899 Other specified pregnancy related conditions, unspecified trimester: Secondary | ICD-10-CM

## 2016-12-06 MED ORDER — OMEPRAZOLE 20 MG PO CPDR: 20.0000 mg | DELAYED_RELEASE_CAPSULE | Freq: Two times a day (BID) | ORAL | 5 refills | Status: DC

## 2016-12-06 MED ORDER — PRENATE MINI 29-0.6-0.4-350 MG PO CAPS: 1.0000 | ORAL_CAPSULE | Freq: Every day | ORAL | 3 refills | Status: DC

## 2016-12-06 NOTE — Addendum Note (Signed)
Addended by: Coral Ceo A on: 12/06/2016 03:42 PM   Modules accepted: Level of Service, SmartSet

## 2016-12-06 NOTE — Progress Notes (Signed)
Subjective:    Sharon Johnston is being seen today for her first obstetrical visit.  This is not a planned pregnancy. She is at [redacted]w[redacted]d gestation. Her obstetrical history is significant for smoker. Relationship with FOB: significant other, not living together. Patient does intend to breast feed. Pregnancy history fully reviewed.  The information documented in the HPI was reviewed and verified.  Menstrual History: OB History    Gravida Para Term Preterm AB Living   0 2 3   SAB TAB Ectopic Multiple Live Births   1 1 0 0 3       Patient's last menstrual period was 07/22/2016 (exact date).    Past Medical History:  Diagnosis Date  . Chlamydia   . Depression    hospitalized for 2 weeks after suicide att  . Hx of gonorrhea   . Hypertension    gestational HTN  . Kidney infection     Past Surgical History:  Procedure Laterality Date  . NO PAST SURGERIES       (Not in a hospital admission) No Known Allergies  Social History  Substance Use Topics  . Smoking status: Current Every Day Smoker    Packs/day: 0.25    Types: Cigarettes  . Smokeless tobacco: Never Used  . Alcohol use No    Family History  Problem Relation Age of Onset  . Hypertension Mother   . Hypertension Maternal Grandmother   . Hypertension Maternal Aunt   . Depression Cousin      Review of Systems Constitutional: negative for weight loss Gastrointestinal: negative for vomiting Genitourinary:negative for genital lesions and vaginal discharge and dysuria Musculoskeletal:negative for back pain Behavioral/Psych: negative for abusive relationship, depression, illegal drug usage and tobacco use    Objective:    BP 122/78   Pulse 84   Wt 137 lb 3.2 oz (62.2 kg)   LMP 07/22/2016 (Exact Date)   BMI 25.09 kg/m  General Appearance:    Alert, cooperative, no distress, appears stated age  Head:    Normocephalic, without obvious abnormality, atraumatic  Eyes:    PERRL, conjunctiva/corneas clear, EOM's  intact, fundi    benign, both eyes  Ears:    Normal TM's and external ear canals, both ears  Nose:   Nares normal, septum midline, mucosa normal, no drainage    or sinus tenderness  Throat:   Lips, mucosa, and tongue normal; teeth and gums normal  Neck:   Supple, symmetrical, trachea midline, no adenopathy;    thyroid:  no enlargement/tenderness/nodules; no carotid   bruit or JVD  Back:     Symmetric, no curvature, ROM normal, no CVA tenderness  Lungs:     Clear to auscultation bilaterally, respirations unlabored  Chest Wall:    No tenderness or deformity   Heart:    Regular rate and rhythm, S1 and S2 normal, no murmur, rub   or gallop  Breast Exam:    No tenderness, masses, or nipple abnormality  Abdomen:     Soft, non-tender, bowel sounds active all four quadrants,    no masses, no organomegaly  Genitalia:    Normal female without lesion, discharge or tenderness  Extremities:   Extremities normal, atraumatic, no cyanosis or edema  Pulses:   2+ and symmetric all extremities  Skin:   Skin color, texture, turgor normal, no rashes or lesions  Lymph nodes:   Cervical, supraclavicular, and axillary nodes normal  Neurologic:   CNII-XII intact, normal strength, sensation and reflexes  throughout      Lab Review Urine pregnancy test Labs reviewed yes Radiologic studies reviewed no  Assessment:    Pregnancy at [redacted]w[redacted]d weeks    Plan:     1. Supervision of other normal pregnancy, antepartum Rx: - Cytology - PAP - Cervicovaginal ancillary only - Hemoglobinopathy evaluation - Hemoglobin A1c - Obstetric Panel, Including HIV - Culture, OB Urine - Vitamin D (25 hydroxy) - [place] MFM OB COMP + 14 WK; Future - AFP TETRA - Prenat w/o A-FeCbn-Meth-FA-DHA (PRENATE MINI) 29-0.6-0.4-350 MG CAPS; Take 1 capsule by mouth daily before breakfast.  Dispense: 90 capsule; Refill: 3 - Enroll Patient in Babyscripts  2. Heartburn during pregnancy, antepartum Rx: - omeprazole (PRILOSEC) 20 MG capsule;  Take 1 capsule (20 mg total) by mouth 2 (two) times daily before a meal.  Dispense: 60 capsule; Refill: 5  Prenatal vitamins.  Counseling provided regarding continued use of seat belts, cessation of alcohol consumption, smoking or use of illicit drugs; infection precautions i.e., influenza/TDAP immunizations, toxoplasmosis,CMV, parvovirus, listeria and varicella; workplace safety, exercise during pregnancy; routine dental care, safe medications, sexual activity, hot tubs, saunas, pools, travel, caffeine use, fish and methlymercury, potential toxins, hair treatments, varicose veins Weight gain recommendations per IOM guidelines reviewed: underweight/BMI< 18.5--> gain 28 - 40 lbs; normal weight/BMI 18.5 - 24.9--> gain 25 - 35 lbs; overweight/BMI 25 - 29.9--> gain 15 - 25 lbs; obese/BMI >30->gain  11 - 20 lbs Problem list reviewed and updated. FIRST/CF mutation testing/NIPT/QUAD SCREEN/fragile X/[name] population testing/Spinal muscular atrophy discussed: requested. Role of ultrasound in pregnancy discussed; fetal survey: requested. Amniocentesis discussed: not indicated.   Meds ordered this encounter  Medications  . Prenat w/o A-FeCbn-Meth-FA-DHA (PRENATE MINI) 29-0.6-0.4-350 MG CAPS    Sig: Take 1 capsule by mouth daily before breakfast.    Dispense:  90 capsule    Refill:  3  . omeprazole (PRILOSEC) 20 MG capsule    Sig: Take 1 capsule (20 mg total) by mouth 2 (two) times daily before a meal.    Dispense:  60 capsule    Refill:  5   Orders Placed This Encounter  Procedures  . Culture, OB Urine  . [place] MFM OB COMP + 14 WK    Standing Status:   Future    Standing Expiration Date:   02/05/2018    Order Specific Question:   Reason for Exam (SYMPTOM  OR DIAGNOSIS REQUIRED)    Answer:   Anatomy    Order Specific Question:   Preferred imaging location?    Answer:   [facility]-Ul[facility]  . Hemoglobinopathy evaluation  . Hemoglobin A1c  . Obstetric Panel, Including HIV  . Vitamin D (25  hydroxy)  . AFP TETRA    Order Specific Question:   Is patient insulin dependent?    Answer:   No    Order Specific Question:   Weight (lbs)    Answer:   45    Order Specific Question:   Gestational Age (GA), weeks    Answer:   19.4    Order Specific Question:   Date on which patient was at this GA    Answer:   12/06/2016    Order Specific Question:   GA Calculation Method    Answer:   LMP    Order Specific Question:   GA Date    Answer:   04/28/2017    Order Specific Question:   Number of fetuses    Answer:   1    Follow up in  4 weeks. 50% of 20 min visit spent on counseling and coordination of care.

## 2016-12-06 NOTE — Addendum Note (Signed)
Addended by: Natale Milch D on: 12/06/2016 02:40 PM   Modules accepted: Orders
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
## 2016-12-07 LAB — CYTOLOGY - PAP
ADEQUACY: ABSENT
Diagnosis: NEGATIVE
HPV: DETECTED — AB

## 2016-12-07 LAB — CERVICOVAGINAL ANCILLARY ONLY
Bacterial vaginitis: NEGATIVE
Candida vaginitis: NEGATIVE
Chlamydia: NEGATIVE
Neisseria Gonorrhea: NEGATIVE
Trichomonas: NEGATIVE

## 2016-12-08 ENCOUNTER — Ambulatory Visit (HOSPITAL_COMMUNITY)
Admission: RE | Admit: 2016-12-08 | Discharge: 2016-12-08 | Disposition: A | Discharge status: Home / Self Care | Payer: Medicaid Other | Source: Ambulatory Visit | Attending: Obstetrics | Admitting: Obstetrics

## 2016-12-08 ENCOUNTER — Other Ambulatory Visit: Payer: Self-pay | Admitting: Obstetrics

## 2016-12-08 DIAGNOSIS — Z3A19 19 weeks gestation of pregnancy: Secondary | ICD-10-CM | POA: Insufficient documentation

## 2016-12-08 DIAGNOSIS — O99332 Smoking (tobacco) complicating pregnancy, second trimester: Secondary | ICD-10-CM | POA: Diagnosis not present

## 2016-12-08 DIAGNOSIS — O368390 Maternal care for abnormalities of the fetal heart rate or rhythm, unspecified trimester, not applicable or unspecified: Secondary | ICD-10-CM

## 2016-12-08 DIAGNOSIS — Z3689 Encounter for other specified antenatal screening: Secondary | ICD-10-CM

## 2016-12-08 DIAGNOSIS — O368320 Maternal care for abnormalities of the fetal heart rate or rhythm, second trimester, not applicable or unspecified: Secondary | ICD-10-CM | POA: Insufficient documentation

## 2016-12-08 DIAGNOSIS — Z3480 Encounter for supervision of other normal pregnancy, unspecified trimester: Secondary | ICD-10-CM

## 2016-12-08 DIAGNOSIS — O0932 Supervision of pregnancy with insufficient antenatal care, second trimester: Secondary | ICD-10-CM

## 2016-12-08 DIAGNOSIS — E559 Vitamin D deficiency, unspecified: Secondary | ICD-10-CM

## 2016-12-08 LAB — OBSTETRIC PANEL, INCLUDING HIV
Antibody Screen: NEGATIVE
BASOS ABS: 0 10*3/uL (ref 0.0–0.2)
Basos: 0 %
EOS (ABSOLUTE): 0.1 10*3/uL (ref 0.0–0.4)
EOS: 2 %
HEMOGLOBIN: 11.2 g/dL (ref 11.1–15.9)
HEP B S AG: NEGATIVE
HIV Screen 4th Generation wRfx: NONREACTIVE
Hematocrit: 33.4 % — ABNORMAL LOW (ref 34.0–46.6)
IMMATURE GRANS (ABS): 0 10*3/uL (ref 0.0–0.1)
IMMATURE GRANULOCYTES: 0 %
LYMPHS: 22 %
Lymphocytes Absolute: 1.3 10*3/uL (ref 0.7–3.1)
MCH: 27.3 pg (ref 26.6–33.0)
MCHC: 33.5 g/dL (ref 31.5–35.7)
MCV: 82 fL (ref 79–97)
MONOS ABS: 0.4 10*3/uL (ref 0.1–0.9)
Monocytes: 8 %
NEUTROS PCT: 68 %
Neutrophils Absolute: 4 10*3/uL (ref 1.4–7.0)
Platelets: 350 10*3/uL (ref 150–379)
RBC: 4.1 x10E6/uL (ref 3.77–5.28)
RDW: 14.9 % (ref 12.3–15.4)
RH TYPE: POSITIVE
RPR Ser Ql: NONREACTIVE
Rubella Antibodies, IGG: 1.82 index (ref >0.99)
WBC: 5.8 10*3/uL (ref 3.4–10.8)

## 2016-12-08 LAB — HEMOGLOBINOPATHY EVALUATION
HGB C: 0 %
HGB S: 0 %
HGB VARIANT: 0 %
Hemoglobin A2 Quantitation: 2.5 % (ref 1.8–3.2)
Hemoglobin F Quantitation: 0 % (ref 0.0–2.0)
Hgb A: 97.5 % (ref 96.4–98.8)

## 2016-12-08 LAB — HEMOGLOBIN A1C
Est. average glucose Bld gHb Est-mCnc: 97 mg/dL
HEMOGLOBIN A1C: 5 % (ref 4.8–5.6)

## 2016-12-08 LAB — URINE CULTURE, OB REFLEX

## 2016-12-08 LAB — VITAMIN D 25 HYDROXY (VIT D DEFICIENCY, FRACTURES): VIT D 25 HYDROXY: 21.1 ng/mL — AB (ref 30.0–100.0)

## 2016-12-08 LAB — CULTURE, OB URINE

## 2016-12-08 MED ORDER — VITAMIN D 50 MCG (2000 UT) PO CAPS: 1.0000 | ORAL_CAPSULE | Freq: Every day | ORAL | 5 refills | Status: DC

## 2016-12-09 ENCOUNTER — Encounter: Payer: Self-pay | Admitting: *Deleted

## 2016-12-09 ENCOUNTER — Other Ambulatory Visit (HOSPITAL_COMMUNITY): Payer: Self-pay | Admitting: *Deleted

## 2016-12-09 DIAGNOSIS — O358XX0 Maternal care for other (suspected) fetal abnormality and damage, not applicable or unspecified: Secondary | ICD-10-CM

## 2016-12-09 DIAGNOSIS — O35BXX0 Maternal care for other (suspected) fetal abnormality and damage, fetal cardiac anomalies, not applicable or unspecified: Secondary | ICD-10-CM

## 2016-12-14 LAB — AFP TETRA
DIA MOM VALUE: 0.83
DIA VALUE (EIA): 164.22 pg/mL
DSR (By Age)    1 IN: 1067
DSR (Second Trimester) 1 IN: 10000
GESTATIONAL AGE AFP: 19.6 wk
MATERNAL AGE AT EDD: 24.1 a
MSAFP Mom: 0.51
MSAFP: 31.8 ng/mL
MSHCG MOM: 0.37
MSHCG: 9396 m[IU]/mL
Osb Risk: 10000
T18 (By Age): 1:4157 {titer}
Test Results:: NEGATIVE
UE3 MOM: 1.3
UE3 VALUE: 2.26 ng/mL
Weight: 137 [lb_av]

## 2016-12-14 LAB — SPECIMEN STATUS REPORT

## 2016-12-17 LAB — SPECIMEN STATUS REPORT

## 2016-12-17 LAB — VARICELLA ZOSTER ANTIBODY, IGG: VARICELLA: 2924 {index} (ref >165)

## 2017-01-03 ENCOUNTER — Encounter: Payer: Self-pay | Admitting: Obstetrics

## 2017-01-05 ENCOUNTER — Ambulatory Visit (HOSPITAL_COMMUNITY)
Admission: RE | Admit: 2017-01-05 | Discharge: 2017-01-05 | Disposition: A | Discharge status: Home / Self Care | Payer: Medicaid Other | Source: Ambulatory Visit | Attending: Obstetrics | Admitting: Obstetrics

## 2017-01-05 ENCOUNTER — Encounter (HOSPITAL_COMMUNITY): Payer: Self-pay

## 2017-01-27 ENCOUNTER — Encounter: Payer: Medicaid Other | Admitting: Obstetrics

## 2017-01-27 ENCOUNTER — Other Ambulatory Visit: Payer: Self-pay

## 2017-02-01 ENCOUNTER — Other Ambulatory Visit: Payer: Medicaid Other

## 2017-02-01 ENCOUNTER — Encounter: Payer: Medicaid Other | Admitting: Obstetrics

## 2017-02-03 ENCOUNTER — Ambulatory Visit (INDEPENDENT_AMBULATORY_CARE_PROVIDER_SITE_OTHER): Payer: Medicaid Other | Admitting: Obstetrics

## 2017-02-03 ENCOUNTER — Other Ambulatory Visit: Payer: Medicaid Other

## 2017-02-03 ENCOUNTER — Encounter: Payer: Self-pay | Admitting: Obstetrics

## 2017-02-03 VITALS — BP 127/80 | HR 98 | Wt 156.2 lb

## 2017-02-03 DIAGNOSIS — Z3483 Encounter for supervision of other normal pregnancy, third trimester: Secondary | ICD-10-CM

## 2017-02-03 DIAGNOSIS — Z3480 Encounter for supervision of other normal pregnancy, unspecified trimester: Secondary | ICD-10-CM

## 2017-02-03 DIAGNOSIS — B373 Candidiasis of vulva and vagina: Secondary | ICD-10-CM

## 2017-02-03 DIAGNOSIS — B3731 Acute candidiasis of vulva and vagina: Secondary | ICD-10-CM

## 2017-02-03 MED ORDER — FLUCONAZOLE 150 MG PO TABS: 150.0000 mg | ORAL_TABLET | Freq: Once | ORAL | 0 refills | Status: AC

## 2017-02-03 MED ORDER — VITAFOL ULTRA 29-0.6-0.4-200 MG PO CAPS: 1.0000 | ORAL_CAPSULE | Freq: Every day | ORAL | 3 refills | Status: DC

## 2017-02-03 NOTE — Progress Notes (Signed)
Subjective:  Sharon Johnston is a 26 y.o. [phone] at 3669w0d being seen today for ongoing prenatal care.  She is currently monitored for the following issues for this low-risk pregnancy and has Depression; Chronic hypertension; Chronic hypertension complicating or reason for care during pregnancy; and Supervision of other normal pregnancy, antepartum on their problem list.  Patient reports vaginal irritation.  Contractions: Not present. Vag. Bleeding: None.   . Denies leaking of fluid.   The following portions of the patient's history were reviewed and updated as appropriate: allergies, current medications, past family history, past medical history, past social history, past surgical history and problem list. Problem list updated.  Objective:   Vitals:   02/03/17 0857  BP: 127/80  Pulse: 98  Weight: 156 lb 3.2 oz (70.9 kg)    Fetal Status: Fetal Heart Rate (bpm): 140         General:  Alert, oriented and cooperative. Patient is in no acute distress.  Skin: Skin is warm and dry. No rash noted.   Cardiovascular: Normal heart rate noted  Respiratory: Normal respiratory effort, no problems with respiration noted  Abdomen: Soft, gravid, appropriate for gestational age. Pain/Pressure: Present     Pelvic:  Cervical exam deferred        Extremities: Normal range of motion.  Edema: Trace  Mental Status: Normal mood and affect. Normal behavior. Normal judgment and thought content.   Urinalysis:      Assessment and Plan:  Pregnancy: [id number] at 5169w0d  1. Supervision of other normal pregnancy, antepartum Rx: - CBC - Glucose Tolerance, 2 Hours w/1 Hour - HIV antibody - RPR - Prenat-Fe Poly-Methfol-FA-DHA (VITAFOL ULTRA) 29-0.6-0.4-200 MG CAPS; Take 1 capsule daily before breakfast by mouth.  Dispense: 90 capsule; Refill: 3  2. Candida vaginitis Rx: - fluconazole (DIFLUCAN) 150 MG tablet; Take 1 tablet (150 mg total) once for 1 dose by mouth.  Dispense: 1 tablet; Refill: 0  Preterm labor  symptoms and general obstetric precautions including but not limited to vaginal bleeding, contractions, leaking of fluid and fetal movement were reviewed in detail with the patient. Please refer to After Visit Summary for other counseling recommendations.  Return in about 2 weeks (around 02/17/2017) for ROB.   Brock BadHarper, Charles A, MD

## 2017-02-03 NOTE — Progress Notes (Signed)
Pt c/o yeast infection and would like rx for PNV

## 2017-02-04 LAB — RPR: RPR Ser Ql: NONREACTIVE

## 2017-02-04 LAB — GLUCOSE TOLERANCE, 2 HOURS W/ 1HR
Glucose, 1 hour: 77 mg/dL (ref 65–179)
Glucose, 2 hour: 67 mg/dL (ref 65–152)
Glucose, Fasting: 69 mg/dL (ref 65–91)

## 2017-02-04 LAB — CBC
HEMATOCRIT: 30.6 % — AB (ref 34.0–46.6)
Hemoglobin: 9.8 g/dL — ABNORMAL LOW (ref 11.1–15.9)
MCH: 27.4 pg (ref 26.6–33.0)
MCHC: 32 g/dL (ref 31.5–35.7)
MCV: 86 fL (ref 79–97)
PLATELETS: 292 10*3/uL (ref 150–379)
RBC: 3.58 x10E6/uL — ABNORMAL LOW (ref 3.77–5.28)
RDW: 14.8 % (ref 12.3–15.4)
WBC: 5.4 10*3/uL (ref 3.4–10.8)

## 2017-02-04 LAB — HIV ANTIBODY (ROUTINE TESTING W REFLEX): HIV Screen 4th Generation wRfx: NONREACTIVE

## 2017-02-17 ENCOUNTER — Encounter: Payer: Medicaid Other | Admitting: Obstetrics

## 2017-02-20 ENCOUNTER — Emergency Department (HOSPITAL_COMMUNITY): Payer: Self-pay

## 2017-02-20 ENCOUNTER — Other Ambulatory Visit: Payer: Self-pay

## 2017-02-20 ENCOUNTER — Encounter (HOSPITAL_COMMUNITY): Payer: Self-pay | Admitting: Emergency Medicine

## 2017-02-20 ENCOUNTER — Emergency Department (HOSPITAL_COMMUNITY)
Admission: EM | Admit: 2017-02-20 | Discharge: 2017-02-20 | Disposition: A | Discharge status: Home / Self Care | Payer: Self-pay | Attending: Emergency Medicine | Admitting: Emergency Medicine

## 2017-02-20 DIAGNOSIS — Y998 Other external cause status: Secondary | ICD-10-CM | POA: Insufficient documentation

## 2017-02-20 DIAGNOSIS — Y9389 Activity, other specified: Secondary | ICD-10-CM | POA: Insufficient documentation

## 2017-02-20 DIAGNOSIS — R51 Headache: Secondary | ICD-10-CM | POA: Insufficient documentation

## 2017-02-20 DIAGNOSIS — O9A212 Injury, poisoning and certain other consequences of external causes complicating pregnancy, second trimester: Secondary | ICD-10-CM | POA: Insufficient documentation

## 2017-02-20 DIAGNOSIS — Z3A24 24 weeks gestation of pregnancy: Secondary | ICD-10-CM | POA: Insufficient documentation

## 2017-02-20 DIAGNOSIS — R109 Unspecified abdominal pain: Secondary | ICD-10-CM | POA: Insufficient documentation

## 2017-02-20 DIAGNOSIS — Y92411 Interstate highway as the place of occurrence of the external cause: Secondary | ICD-10-CM | POA: Insufficient documentation

## 2017-02-20 DIAGNOSIS — I10 Essential (primary) hypertension: Secondary | ICD-10-CM | POA: Insufficient documentation

## 2017-02-20 LAB — COMPREHENSIVE METABOLIC PANEL
ALK PHOS: 58 U/L (ref 38–126)
ALT: 11 U/L — AB (ref 14–54)
ANION GAP: 7 (ref 5–15)
AST: 18 U/L (ref 15–41)
Albumin: 2.8 g/dL — ABNORMAL LOW (ref 3.5–5.0)
BILIRUBIN TOTAL: 0.6 mg/dL (ref 0.3–1.2)
BUN: 12 mg/dL (ref 6–20)
CALCIUM: 8.5 mg/dL — AB (ref 8.9–10.3)
CO2: 21 mmol/L — AB (ref 22–32)
CREATININE: 0.73 mg/dL (ref 0.44–1.00)
Chloride: 107 mmol/L (ref 101–111)
GFR calc non Af Amer: >60 mL/min (ref >60)
Glucose, Bld: 88 mg/dL (ref 65–99)
Potassium: 3.3 mmol/L — ABNORMAL LOW (ref 3.5–5.1)
SODIUM: 135 mmol/L (ref 135–145)
TOTAL PROTEIN: 6.3 g/dL — AB (ref 6.5–8.1)

## 2017-02-20 LAB — PROTIME-INR
INR: 1.6
Prothrombin Time: 18.9 seconds — ABNORMAL HIGH (ref 11.4–15.2)

## 2017-02-20 LAB — CBC
HCT: 31.5 % — ABNORMAL LOW (ref 36.0–46.0)
HEMOGLOBIN: 10 g/dL — AB (ref 12.0–15.0)
MCH: 27.5 pg (ref 26.0–34.0)
MCHC: 31.7 g/dL (ref 30.0–36.0)
MCV: 86.8 fL (ref 78.0–100.0)
PLATELETS: 275 10*3/uL (ref 150–400)
RBC: 3.63 MIL/uL — AB (ref 3.87–5.11)
RDW: 13.8 % (ref 11.5–15.5)
WBC: 5.7 10*3/uL (ref 4.0–10.5)

## 2017-02-20 LAB — I-STAT CHEM 8, ED
BUN: 13 mg/dL (ref 6–20)
CALCIUM ION: 1.11 mmol/L — AB (ref 1.15–1.40)
CHLORIDE: 105 mmol/L (ref 101–111)
CREATININE: 0.7 mg/dL (ref 0.44–1.00)
GLUCOSE: 90 mg/dL (ref 65–99)
HCT: 32 % — ABNORMAL LOW (ref 36.0–46.0)
Hemoglobin: 10.9 g/dL — ABNORMAL LOW (ref 12.0–15.0)
POTASSIUM: 3.5 mmol/L (ref 3.5–5.1)
Sodium: 138 mmol/L (ref 135–145)
TCO2: 23 mmol/L (ref 22–32)

## 2017-02-20 LAB — SAMPLE TO BLOOD BANK

## 2017-02-20 LAB — CDS SEROLOGY

## 2017-02-20 LAB — I-STAT CG4 LACTIC ACID, ED: LACTIC ACID, VENOUS: 1.61 mmol/L (ref 0.5–1.9)

## 2017-02-20 LAB — ETHANOL

## 2017-02-20 MED ORDER — SODIUM CHLORIDE 0.9 % IV BOLUS (SEPSIS)
1000.0000 mL | Freq: Once | INTRAVENOUS | Status: AC
  Administered 2017-02-20: 1000 mL via INTRAVENOUS

## 2017-02-20 MED ORDER — MORPHINE SULFATE (PF) 4 MG/ML IV SOLN
4.0000 mg | Freq: Once | INTRAVENOUS | Status: AC
  Administered 2017-02-20: 4 mg via INTRAVENOUS
  Filled 2017-02-20: qty 1

## 2017-02-20 MED ORDER — IOPAMIDOL (ISOVUE-300) INJECTION 61%
INTRAVENOUS | Status: AC
  Administered 2017-02-20: 100 mL
  Filled 2017-02-20: qty 100

## 2017-02-20 MED ORDER — LACTATED RINGERS IV BOLUS (SEPSIS): 1000.0000 mL | Freq: Once | INTRAVENOUS | Status: DC

## 2017-02-20 MED ORDER — ONDANSETRON HCL 4 MG/2ML IJ SOLN
4.0000 mg | Freq: Once | INTRAMUSCULAR | Status: AC
  Administered 2017-02-20: 4 mg via INTRAVENOUS
  Filled 2017-02-20: qty 2

## 2017-02-20 NOTE — Progress Notes (Signed)
Spoke with Dr. Alysia PennaErvin. Pt is OB cleared and can be dc'd home. ED staff notified.

## 2017-02-20 NOTE — Progress Notes (Signed)
Pt eating a sandwich.No complaints
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
## 2017-02-20 NOTE — Progress Notes (Signed)
Spoke to Dr. Alysia PennaErvin. Pt is having irregular uc's that she is not feeling. CT scans neg. FHR is a category 1. Okay to give pt a liter of IVF.
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
## 2017-02-20 NOTE — Progress Notes (Signed)
I have reviewed the pt's prenatal record. She has an EDC of 04/28/2017 which puts her at 30 3/[redacted] weeks gestation. It is documented that she has chronic hypertension.
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
## 2017-02-20 NOTE — Discharge Instructions (Signed)
Take tylenol 1000mg(2 extra strength) four times a day.  ° °

## 2017-02-20 NOTE — ED Notes (Signed)
Family updated as to patient's status.

## 2017-02-20 NOTE — Progress Notes (Signed)
Orthopedic Tech Progress Note Patient Details:  Adriana Reamslexus Faria [birth year] [id number]  Patient ID: Adriana ReamsAlexus Orourke, female   DOB: [birth year], 26 y.o.   MRN: [id number]   Nikki DomCrawford, Tykee Heideman 02/20/2017, 9:38 AM Made level 2 trauma visit

## 2017-02-20 NOTE — ED Notes (Signed)
Patient returned from CT. No complaints at this time.
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
## 2017-02-21 ENCOUNTER — Encounter: Payer: Self-pay | Admitting: Obstetrics

## 2017-02-21 ENCOUNTER — Encounter (HOSPITAL_COMMUNITY): Payer: Self-pay

## 2017-02-23 ENCOUNTER — Encounter: Payer: Self-pay | Admitting: Obstetrics

## 2017-02-23 ENCOUNTER — Other Ambulatory Visit: Payer: Self-pay

## 2017-02-23 ENCOUNTER — Ambulatory Visit (INDEPENDENT_AMBULATORY_CARE_PROVIDER_SITE_OTHER): Payer: Self-pay | Admitting: Obstetrics

## 2017-02-23 DIAGNOSIS — Z3483 Encounter for supervision of other normal pregnancy, third trimester: Secondary | ICD-10-CM

## 2017-02-23 DIAGNOSIS — Z3480 Encounter for supervision of other normal pregnancy, unspecified trimester: Secondary | ICD-10-CM

## 2017-02-23 NOTE — Progress Notes (Signed)
Subjective:  Sharon Johnston is a 26 y.o. [phone] [id number] at 4821w6d being seen today for ongoing prenatal care.  She is currently monitored for the following issues for this low-risk pregnancy and has Depression; Chronic hypertension; Chronic hypertension complicating or reason for care during pregnancy; and Supervision of other normal pregnancy, antepartum on their problem list.  Patient reports no complaints.  Contractions: Irritability. Vag. Bleeding: None.  Movement: Present. Denies leaking of fluid.   The following portions of the patient's history were reviewed and updated as appropriate: allergies, current medications, past family history, past medical history, past social history, past surgical history and problem list. Problem list updated.  Objective:   Vitals:   02/23/17 1407  BP: (!) 106/53  Pulse: 84  Weight: 157 lb 9.6 oz (71.5 kg)    Fetal Status:     Movement: Present     General:  Alert, oriented and cooperative. Patient is in no acute distress.  Skin: Skin is warm and dry. No rash noted.   Cardiovascular: Normal heart rate noted  Respiratory: Normal respiratory effort, no problems with respiration noted  Abdomen: Soft, gravid, appropriate for gestational age. Pain/Pressure: Absent     Pelvic:  Cervical exam deferred        Extremities: Normal range of motion.  Edema: None  Mental Status: Normal mood and affect. Normal behavior. Normal judgment and thought content.   Urinalysis:      Assessment and Plan:  Pregnancy: [id number] at 1621w6d  1. Supervision of other normal pregnancy, antepartum   Preterm labor symptoms and general obstetric precautions including but not limited to vaginal bleeding, contractions, leaking of fluid and fetal movement were reviewed in detail with the patient. Please refer to After Visit Summary for other counseling recommendations.  Return in about 2 weeks (around 03/09/2017) for ROB.   Brock BadHarper, Aydien Majette A, MD

## 2017-03-09 ENCOUNTER — Ambulatory Visit (INDEPENDENT_AMBULATORY_CARE_PROVIDER_SITE_OTHER): Payer: Self-pay | Admitting: Obstetrics

## 2017-03-09 ENCOUNTER — Encounter: Payer: Self-pay | Admitting: Obstetrics

## 2017-03-09 VITALS — BP 131/72 | HR 79 | Wt 160.0 lb

## 2017-03-09 DIAGNOSIS — N898 Other specified noninflammatory disorders of vagina: Secondary | ICD-10-CM

## 2017-03-09 DIAGNOSIS — Z3480 Encounter for supervision of other normal pregnancy, unspecified trimester: Secondary | ICD-10-CM

## 2017-03-09 DIAGNOSIS — O26899 Other specified pregnancy related conditions, unspecified trimester: Secondary | ICD-10-CM

## 2017-03-09 DIAGNOSIS — O26893 Other specified pregnancy related conditions, third trimester: Secondary | ICD-10-CM

## 2017-03-09 MED ORDER — METRONIDAZOLE 500 MG PO TABS: 500.0000 mg | ORAL_TABLET | Freq: Two times a day (BID) | ORAL | 2 refills | Status: DC

## 2017-03-09 MED ORDER — FLUCONAZOLE 150 MG PO TABS: 150.0000 mg | ORAL_TABLET | Freq: Once | ORAL | 0 refills | Status: AC

## 2017-03-09 NOTE — Progress Notes (Signed)
Subjective:  Sharon Johnston is a 26 y.o. [phone] at 3747w6d being seen today for ongoing prenatal care.  She is currently monitored for the following issues for this low-risk pregnancy and has Depression; Chronic hypertension; Chronic hypertension complicating or reason for care during pregnancy; and Supervision of other normal pregnancy, antepartum on their problem list.  Patient reports vaginal irritation.  Contractions: Not present. Vag. Bleeding: None.  Movement: Present. Denies leaking of fluid.   The following portions of the patient's history were reviewed and updated as appropriate: allergies, current medications, past family history, past medical history, past social history, past surgical history and problem list. Problem list updated.  Objective:   Vitals:   03/09/17 0859  BP: 131/72  Pulse: 79  Weight: 160 lb (72.6 kg)    Fetal Status: Fetal Heart Rate (bpm): 140   Movement: Present     General:  Alert, oriented and cooperative. Patient is in no acute distress.  Skin: Skin is warm and dry. No rash noted.   Cardiovascular: Normal heart rate noted  Respiratory: Normal respiratory effort, no problems with respiration noted  Abdomen: Soft, gravid, appropriate for gestational age. Pain/Pressure: Present     Pelvic:  Cervical exam deferred        Extremities: Normal range of motion.  Edema: None  Mental Status: Normal mood and affect. Normal behavior. Normal judgment and thought content.   Urinalysis:      Assessment and Plan:  Pregnancy: [id number] at 8847w6d  1. Supervision of other normal pregnancy, antepartum   2. Vaginal discharge during pregnancy, antepartum Rx: - Cervicovaginal ancillary only - metroNIDAZOLE (FLAGYL) 500 MG tablet; Take 1 tablet (500 mg total) by mouth 2 (two) times daily.  Dispense: 14 tablet; Refill: 2 - fluconazole (DIFLUCAN) 150 MG tablet; Take 1 tablet (150 mg total) by mouth once for 1 dose.  Dispense: 1 tablet; Refill: 0  Preterm labor symptoms  and general obstetric precautions including but not limited to vaginal bleeding, contractions, leaking of fluid and fetal movement were reviewed in detail with the patient. Please refer to After Visit Summary for other counseling recommendations.  Return in about 2 weeks (around 03/23/2017) for ROB.   Brock BadHarper, Jaedyn Marrufo A, MD

## 2017-03-09 NOTE — Progress Notes (Signed)
ROB w/ complaint of possible yeast infection or BV wants to be checked.  Also mentioned work restrictions. 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

## 2017-03-10 LAB — CERVICOVAGINAL ANCILLARY ONLY
Bacterial vaginitis: NEGATIVE
CANDIDA VAGINITIS: NEGATIVE
Trichomonas: NEGATIVE

## 2017-03-13 ENCOUNTER — Inpatient Hospital Stay (HOSPITAL_COMMUNITY)
Admission: AD | Admit: 2017-03-13 | Discharge: 2017-03-13 | Disposition: A | Discharge status: Home / Self Care | Payer: Self-pay | Source: Ambulatory Visit | Attending: Obstetrics & Gynecology | Admitting: Obstetrics & Gynecology

## 2017-03-13 ENCOUNTER — Encounter (HOSPITAL_COMMUNITY): Payer: Self-pay | Admitting: Emergency Medicine

## 2017-03-13 DIAGNOSIS — O9989 Other specified diseases and conditions complicating pregnancy, childbirth and the puerperium: Secondary | ICD-10-CM

## 2017-03-13 DIAGNOSIS — O163 Unspecified maternal hypertension, third trimester: Secondary | ICD-10-CM | POA: Insufficient documentation

## 2017-03-13 DIAGNOSIS — O99343 Other mental disorders complicating pregnancy, third trimester: Secondary | ICD-10-CM | POA: Insufficient documentation

## 2017-03-13 DIAGNOSIS — Z79899 Other long term (current) drug therapy: Secondary | ICD-10-CM | POA: Insufficient documentation

## 2017-03-13 DIAGNOSIS — O468X3 Other antepartum hemorrhage, third trimester: Secondary | ICD-10-CM | POA: Insufficient documentation

## 2017-03-13 DIAGNOSIS — O99333 Smoking (tobacco) complicating pregnancy, third trimester: Secondary | ICD-10-CM | POA: Insufficient documentation

## 2017-03-13 DIAGNOSIS — F17210 Nicotine dependence, cigarettes, uncomplicated: Secondary | ICD-10-CM | POA: Insufficient documentation

## 2017-03-13 DIAGNOSIS — Z3A33 33 weeks gestation of pregnancy: Secondary | ICD-10-CM | POA: Insufficient documentation

## 2017-03-13 DIAGNOSIS — F329 Major depressive disorder, single episode, unspecified: Secondary | ICD-10-CM | POA: Insufficient documentation

## 2017-03-13 DIAGNOSIS — N930 Postcoital and contact bleeding: Secondary | ICD-10-CM | POA: Diagnosis present

## 2017-03-13 LAB — URINALYSIS, ROUTINE W REFLEX MICROSCOPIC
BILIRUBIN URINE: NEGATIVE
GLUCOSE, UA: NEGATIVE mg/dL
KETONES UR: NEGATIVE mg/dL
Nitrite: NEGATIVE
PH: 6 (ref 5.0–8.0)
PROTEIN: NEGATIVE mg/dL
Specific Gravity, Urine: 1.013 (ref 1.005–1.030)

## 2017-03-13 MED ORDER — ACETAMINOPHEN 500 MG PO TABS
1000.0000 mg | ORAL_TABLET | Freq: Once | ORAL | Status: AC
  Administered 2017-03-13: 1000 mg via ORAL
  Filled 2017-03-13: qty 2
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
## 2017-03-13 NOTE — MAU Provider Note (Signed)
History     CSN: [id number]  Arrival date and time: 03/13/17 0455   First Provider Initiated Contact with Patient 03/13/17 0542      Chief Complaint  Patient presents with  . Contractions  . Vaginal Bleeding   HPI  HPI: Sharon Johnston is a 26 y.o. year old [phone] female at 1040w3d weeks gestation who presents to MAU reporting vaginal bleeding after having SI at 0300 today. She [facility]   Past Medical History:  Diagnosis Date  . Chlamydia   . Depression    hospitalized for 2 weeks after suicide att  . Hx of gonorrhea   . Hypertension    gestational HTN  . Hypertension   . Kidney infection     Past Surgical History:  Procedure Laterality Date  . NO PAST SURGERIES      Family History  Problem Relation Age of Onset  . Hypertension Mother   . Hypertension Maternal Grandmother   . Hypertension Maternal Aunt   . Depression Cousin     Social History   Tobacco Use  . Smoking status: Current Every Day Smoker    Packs/day: 0.25    Types: Cigarettes  . Smokeless tobacco: Current User  Substance Use Topics  . Alcohol use: No    Frequency: Never  . Drug use: No    Allergies: No Known Allergies  Medications Prior to Admission  Medication Sig Dispense Refill Last Dose  . Cholecalciferol (VITAMIN D) 2000 units CAPS Take 1 capsule (2,000 Units total) by mouth daily. (Patient not taking: Reported on 03/09/2017) 30 capsule 5 Not Taking  . metoCLOPramide (REGLAN) 10 MG tablet Take 1 tablet (10 mg total) by mouth every 6 (six) hours as needed for nausea. (Patient not taking: Reported on 02/03/2017) 30 tablet 0 Not Taking  . metroNIDAZOLE (FLAGYL) 500 MG tablet Take 1 tablet (500 mg total) by mouth 2 (two) times daily. 14 tablet 2   . omeprazole (PRILOSEC) 20 MG capsule Take 1 capsule (20 mg total) by mouth 2 (two) times daily before a meal. (Patient not taking: Reported on 03/09/2017) 60 capsule 5 Not Taking  . Prenat w/o  A-FeCbn-Meth-FA-DHA (PRENATE MINI) 29-0.6-0.4-350 MG CAPS Take 1 capsule by mouth daily before breakfast. (Patient not taking: Reported on 03/09/2017) 90 capsule 3 Not Taking  . Prenat-Fe Poly-Methfol-FA-DHA (VITAFOL ULTRA) 29-0.6-0.4-200 MG CAPS Take 1 capsule daily before breakfast by mouth. (Patient not taking: Reported on 03/09/2017) 90 capsule 3 Not Taking    Review of Systems Physical Exam   Blood pressure 132/70, pulse 78, temperature 97.8 F (36.6 C), temperature source Oral, resp. rate 17, height 5\' 2"  (1.575 m), weight 160 lb (72.6 kg), last menstrual period 07/22/2016, SpO2 97 %.  Physical Exam  MAU Course  Procedures  MDM CCUA SSE  Tylenol 1000 mg -- improved pain  Results for orders placed or performed during the hospital encounter of 03/13/17 (from the past 24 hour(s))  Urinalysis, Routine w reflex microscopic     Status: Abnormal   Collection Time: 03/13/17  5:10 AM  Result Value Ref Range   Color, Urine YELLOW YELLOW   APPearance CLEAR CLEAR   Specific Gravity, Urine 1.013 1.005 - 1.030   pH 6.0 5.0 - 8.0   Glucose, UA NEGATIVE NEGATIVE mg/dL   Hgb urine dipstick MODERATE (A) NEGATIVE   Bilirubin Urine NEGATIVE NEGATIVE   Ketones, ur NEGATIVE NEGATIVE mg/dL   Protein, ur NEGATIVE NEGATIVE mg/dL  Nitrite NEGATIVE NEGATIVE   Leukocytes, UA TRACE (A) NEGATIVE   RBC / HPF 0-5 0 - 5 RBC/hpf   WBC, UA 0-5 0 - 5 WBC/hpf   Bacteria, UA FEW (A) NONE SEEN   Squamous Epithelial / LPF 0-5 (A) NONE SEEN   Mucus PRESENT     Assessment and Plan  Postcoital bleeding - Information provided on vaginal bleeding in 2nd trimester pregnancy - Discharge home - Keep scheduled appointment with [facility] on 03/24/2017 - Patient verbalized an understanding of the plan of care and agrees.   [name], MSN, CNM 03/13/2017, 5:58 AM

## 2017-03-18 ENCOUNTER — Emergency Department (HOSPITAL_COMMUNITY)
Admission: EM | Admit: 2017-03-18 | Discharge: 2017-03-18 | Disposition: A | Discharge status: Home / Self Care | Payer: Self-pay | Attending: Emergency Medicine | Admitting: Emergency Medicine

## 2017-03-18 ENCOUNTER — Encounter (HOSPITAL_COMMUNITY): Payer: Self-pay | Admitting: *Deleted

## 2017-03-18 ENCOUNTER — Other Ambulatory Visit: Payer: Self-pay

## 2017-03-18 DIAGNOSIS — Y929 Unspecified place or not applicable: Secondary | ICD-10-CM | POA: Insufficient documentation

## 2017-03-18 DIAGNOSIS — Z79899 Other long term (current) drug therapy: Secondary | ICD-10-CM | POA: Insufficient documentation

## 2017-03-18 DIAGNOSIS — Y998 Other external cause status: Secondary | ICD-10-CM | POA: Insufficient documentation

## 2017-03-18 DIAGNOSIS — L089 Local infection of the skin and subcutaneous tissue, unspecified: Secondary | ICD-10-CM | POA: Insufficient documentation

## 2017-03-18 DIAGNOSIS — F17210 Nicotine dependence, cigarettes, uncomplicated: Secondary | ICD-10-CM | POA: Insufficient documentation

## 2017-03-18 DIAGNOSIS — O99333 Smoking (tobacco) complicating pregnancy, third trimester: Secondary | ICD-10-CM | POA: Insufficient documentation

## 2017-03-18 DIAGNOSIS — Y939 Activity, unspecified: Secondary | ICD-10-CM | POA: Insufficient documentation

## 2017-03-18 DIAGNOSIS — O9989 Other specified diseases and conditions complicating pregnancy, childbirth and the puerperium: Secondary | ICD-10-CM | POA: Insufficient documentation

## 2017-03-18 DIAGNOSIS — O133 Gestational [pregnancy-induced] hypertension without significant proteinuria, third trimester: Secondary | ICD-10-CM | POA: Insufficient documentation

## 2017-03-18 DIAGNOSIS — W57XXXA Bitten or stung by nonvenomous insect and other nonvenomous arthropods, initial encounter: Secondary | ICD-10-CM | POA: Insufficient documentation

## 2017-03-18 DIAGNOSIS — S80861A Insect bite (nonvenomous), right lower leg, initial encounter: Secondary | ICD-10-CM | POA: Insufficient documentation

## 2017-03-18 DIAGNOSIS — Z3A33 33 weeks gestation of pregnancy: Secondary | ICD-10-CM | POA: Insufficient documentation

## 2017-03-18 MED ORDER — CEPHALEXIN 500 MG PO CAPS: 500.0000 mg | ORAL_CAPSULE | Freq: Three times a day (TID) | ORAL | 0 refills | Status: DC

## 2017-03-18 MED ORDER — ACETAMINOPHEN 500 MG PO TABS: 500.0000 mg | ORAL_TABLET | Freq: Four times a day (QID) | ORAL | 0 refills | Status: DC | PRN

## 2017-03-18 NOTE — ED Provider Notes (Signed)
[facility] EMERGENCY [facility]PARTMENT Provider Note   CSN: [id number] Arrival date & time: 03/18/17  1259     History   Chief Complaint Chief Complaint  Patient presents with  . Leg Pain    HPI Sharon Johnston is a 26 y.o. female.  HPI  26 year old female who is [redacted] weeks pregnant presenting for evaluation of skin infection to her leg.  Patient noticed multiple bumps to her right lower leg ongoing for the past 1 week.  Reports some mild itching and tenderness to the affected location.  She noticed increased redness and pain that has spread throughout her lower leg.  She believes she may have been bitten by some kind of insect although she did not see it.  She denies any associated fever, chills, abdominal pain, or other OB/GYN related complaint.  Denies any specific treatment tried.  No recent travel.  Last tetanus as 08/12/2013  Past Medical History:  Diagnosis Date  . Chlamydia   . Depression    hospitalized for 2 weeks after suicide att  . Hx of gonorrhea   . Hypertension    gestational HTN  . Hypertension   . Kidney infection     Patient Active Problem List   Diagnosis Date Noted  . Postcoital bleeding 03/13/2017  . Supervision of other normal pregnancy, antepartum 12/06/2016  . Chronic hypertension 10/31/2015  . Chronic hypertension complicating or reason for care during pregnancy 10/31/2015  . Depression 06/15/2013    Past Surgical History:  Procedure Laterality Date  . NO PAST SURGERIES      OB History    Gravida Para Term Preterm AB Living   6 3 3  0 2 3   SAB TAB Ectopic Multiple Live Births   1 1 0   3       Home Medications    Prior to Admission medications   Medication Sig Start Date End Date Taking? Authorizing Provider  metroNIDAZOLE (FLAGYL) 500 MG tablet Take 1 tablet (500 mg total) by mouth 2 (two) times daily. 03/09/17  Yes Brock BadHarper, Charles A, MD  Cholecalciferol (VITAMIN D) 2000 units CAPS Take 1 capsule (2,000 Units total) by  mouth daily. Patient not taking: Reported on 03/09/2017 12/08/16   Brock BadHarper, Charles A, MD  metoCLOPramide (REGLAN) 10 MG tablet Take 1 tablet (10 mg total) by mouth every 6 (six) hours as needed for nausea. Patient not taking: Reported on 02/03/2017 11/21/16   Donette LarryBhambri, Melanie, CNM  omeprazole (PRILOSEC) 20 MG capsule Take 1 capsule (20 mg total) by mouth 2 (two) times daily before a meal. Patient not taking: Reported on 03/09/2017 12/06/16   Brock BadHarper, Charles A, MD  Prenat w/o A-FeCbn-Meth-FA-DHA (PRENATE MINI) 29-0.6-0.4-350 MG CAPS Take 1 capsule by mouth daily before breakfast. Patient not taking: Reported on 03/09/2017 12/06/16   Brock BadHarper, Charles A, MD  Prenat-Fe Poly-Methfol-FA-DHA (VITAFOL ULTRA) 29-0.6-0.4-200 MG CAPS Take 1 capsule daily before breakfast by mouth. Patient not taking: Reported on 03/09/2017 02/03/17   Brock BadHarper, Charles A, MD    Family History Family History  Problem Relation Age of Onset  . Hypertension Mother   . Hypertension Maternal Grandmother   . Hypertension Maternal Aunt   . Depression Cousin     Social History Social History   Tobacco Use  . Smoking status: Current Every Day Smoker    Packs/day: 0.25    Types: Cigarettes  . Smokeless tobacco: Current User  Substance Use Topics  . Alcohol use: No    Frequency: Never  .  Drug use: No     Allergies   Patient has no known allergies.   Review of Systems Review of Systems  Constitutional: Negative for fever.  Skin: Positive for rash.     Physical Exam Updated Vital Signs BP 117/80 (BP Location: Right Arm)   Pulse 72   Temp 98.9 F (37.2 C) (Oral)   Resp 16   LMP 07/22/2016 (Exact Date)   SpO2 100%   Physical Exam  Constitutional: She appears well-developed and well-nourished. No distress.  HENT:  Head: Atraumatic.  Eyes: Conjunctivae are normal.  Neck: Neck supple.  Abdominal: Soft. There is no tenderness.  Gravid abdomen  Neurological: She is alert.  Skin: Rash noted.  RLE: 3 localize  bumps each measuring 3mm with overlying scab, mild induration with surrounding skin erythema but no fluctuance.  Ttp.  Locate to anterior lower leg.    Psychiatric: She has a normal mood and affect.  Nursing note and vitals reviewed.    ED Treatments / Results  Labs (all labs ordered are listed, but only abnormal results are displayed) Labs Reviewed - No data to display  EKG  EKG Interpretation None       Radiology No results found.  Procedures Procedures (including critical care time)  Medications Ordered in ED Medications - No data to display   Initial Impression / Assessment and Plan / ED Course  I have reviewed the triage vital signs and the nursing notes.  Pertinent labs & imaging results that were available during my care of the patient were reviewed by me and considered in my medical decision making (see chart for details).     BP 131/61   Pulse 73   Temp 98.9 F (37.2 C) (Oral)   Resp 16   LMP 07/22/2016 (Exact Date)   SpO2 100%    Final Clinical Impressions(s) / ED Diagnoses   Final diagnoses:  Infected insect bite of right leg, initial encounter    ED Discharge Orders        Ordered    acetaminophen (TYLENOL) 500 MG tablet  Every 6 hours PRN     03/18/17 1648    cephALEXin (KEFLEX) 500 MG capsule  3 times daily     03/18/17 1648      4:43 PM Pt with multiple lesions to RLE which appears to be infected insect bite with surrounding cellulitic skin changes.  No appreciable abscess.  No lymphangitis.  Will treat with clindamycin and tylenol.  She is also [redacted] weeks pregnant. No complication of pregnancy.  Rapid OB nurse were contacted and recommend checking FHT.  Fetal heart rate is 143.  Report mild headache.  No nuchal rigidity to suggest meningitis.  No hypertension concerning for preeclampsia. Encourage return in 48 hrs for recheck if no improvement. May need to switch to clinda if keflex doesn't provide relief.       Fayrene Helperran, Tareva Leske, PA-C 03/18/17  1650    Arby BarrettePfeiffer, Marcy, MD 03/19/17 [id number]

## 2017-03-18 NOTE — Discharge Instructions (Signed)
Please continue to apply warm moist compress to affected area several times daily to aid with healing.  Take antibiotic as prescribed for the full duration.  Take tylenol as needed for pain.  Return in 48 hrs if you notice no improvement.

## 2017-03-18 NOTE — ED Triage Notes (Signed)
Pt is approx [redacted] weeks pregnant. Has multiple bumps to right lower leg x 1 week. Reports having swelling to entire leg with pain and headache. No ob/gyn complaints. No acute distress is noted at triage.

## 2017-03-18 NOTE — ED Notes (Signed)
Declined W/C at D/C and was escorted to lobby by RN. 

## 2017-03-18 NOTE — Progress Notes (Signed)
Spoke with Dr. Erin FullingHarraway-Smith. [facility] since she has no OB complaints.

## 2017-03-18 NOTE — ED Triage Notes (Signed)
TC to rapid response nurse. The rapid response nurse reported the plan to contact Pt's OB for recommendations .

## 2017-03-18 NOTE — ED Triage Notes (Signed)
[facility] , information provided was [facility].

## 2017-03-21 NOTE — L&D Delivery Note (Signed)
Delivery Note At  a viable female was delivered via  (Presentation:vertex ;LOA  ).  APGAR:9 ,9 ; weight  .   Placenta status: spont,duncans.  Cord:3vc  with the following complications: none.  Cord pH: n/a  Anesthesia:  epidural Episiotomy:  none Lacerations:  none Suture Repair: n/a Est. Blood Loss 300 (mL):    Mom to postpartum.  Baby to Couplet care / Skin to Skin.  Sharon Johnston 04/23/2017, 9:18 AM

## 2017-03-24 ENCOUNTER — Encounter: Payer: Self-pay | Admitting: Obstetrics

## 2017-03-30 ENCOUNTER — Other Ambulatory Visit: Payer: Self-pay

## 2017-03-30 ENCOUNTER — Encounter: Payer: Self-pay | Admitting: Obstetrics

## 2017-03-30 ENCOUNTER — Ambulatory Visit (INDEPENDENT_AMBULATORY_CARE_PROVIDER_SITE_OTHER): Payer: Medicaid Other | Admitting: Obstetrics

## 2017-03-30 ENCOUNTER — Other Ambulatory Visit (HOSPITAL_COMMUNITY)
Admission: RE | Admit: 2017-03-30 | Discharge: 2017-03-30 | Disposition: A | Discharge status: Home / Self Care | Payer: Medicaid Other | Source: Ambulatory Visit | Attending: Obstetrics | Admitting: Obstetrics

## 2017-03-30 VITALS — BP 122/62 | HR 79 | Wt 161.0 lb

## 2017-03-30 DIAGNOSIS — N898 Other specified noninflammatory disorders of vagina: Secondary | ICD-10-CM | POA: Insufficient documentation

## 2017-03-30 DIAGNOSIS — Z3483 Encounter for supervision of other normal pregnancy, third trimester: Secondary | ICD-10-CM

## 2017-03-30 DIAGNOSIS — O9989 Other specified diseases and conditions complicating pregnancy, childbirth and the puerperium: Secondary | ICD-10-CM

## 2017-03-30 DIAGNOSIS — Z3480 Encounter for supervision of other normal pregnancy, unspecified trimester: Secondary | ICD-10-CM

## 2017-03-30 NOTE — Progress Notes (Signed)
Had bright red discharge x 2 days last week. Complains of malodorous discharge x 1 week with lower abdominal pain.

## 2017-03-30 NOTE — Progress Notes (Signed)
Subjective:  Sharon Johnston is a 27 y.o. [id number] at 10537w6d being seen today for ongoing prenatal care.  She is currently monitored for the following issues for this low-risk pregnancy and has Depression; Chronic hypertension; Chronic hypertension complicating or reason for care during pregnancy; Supervision of other normal pregnancy, antepartum; and Postcoital bleeding on their problem list.  Patient reports malodorous vaginal discharge.  Contractions: Irritability. Vag. Bleeding: None.  Movement: Present. Denies leaking of fluid.   The following portions of the patient's history were reviewed and updated as appropriate: allergies, current medications, past family history, past medical history, past social history, past surgical history and problem list. Problem list updated.  Objective:   Vitals:   03/30/17 1101  BP: 122/62  Pulse: 79  Weight: 161 lb (73 kg)    Fetal Status: Fetal Heart Rate (bpm): 140   Movement: Present     General:  Alert, oriented and cooperative. Patient is in no acute distress.  Skin: Skin is warm and dry. No rash noted.   Cardiovascular: Normal heart rate noted  Respiratory: Normal respiratory effort, no problems with respiration noted  Abdomen: Soft, gravid, appropriate for gestational age. Pain/Pressure: Present     Pelvic:  Cervical exam deferred        Extremities: Normal range of motion.  Edema: None  Mental Status: Normal mood and affect. Normal behavior. Normal judgment and thought content.   Urinalysis:      Assessment and Plan:  Pregnancy: [id number] at 3837w6d  1. Supervision of other normal pregnancy, antepartum  2. Vaginal discharge Rx: - Strep Gp B NAA - Cervicovaginal ancillary only  Preterm labor symptoms and general obstetric precautions including but not limited to vaginal bleeding, contractions, leaking of fluid and fetal movement were reviewed in detail with the patient. Please refer to After Visit Summary for other counseling  recommendations.  Return in about 1 week (around 04/06/2017) for ROB.   Brock BadHarper, Somaya Grassi A, MD

## 2017-03-31 LAB — CERVICOVAGINAL ANCILLARY ONLY
Bacterial vaginitis: NEGATIVE
CHLAMYDIA, DNA PROBE: NEGATIVE
Candida vaginitis: NEGATIVE
NEISSERIA GONORRHEA: NEGATIVE
Trichomonas: NEGATIVE

## 2017-04-01 LAB — STREP GP B NAA: Strep Gp B NAA: NEGATIVE

## 2017-04-06 ENCOUNTER — Ambulatory Visit (INDEPENDENT_AMBULATORY_CARE_PROVIDER_SITE_OTHER): Payer: Medicaid Other | Admitting: Obstetrics

## 2017-04-06 ENCOUNTER — Encounter: Payer: Self-pay | Admitting: Obstetrics

## 2017-04-06 VITALS — BP 129/79 | HR 76 | Wt 160.6 lb

## 2017-04-06 DIAGNOSIS — Z3483 Encounter for supervision of other normal pregnancy, third trimester: Secondary | ICD-10-CM

## 2017-04-06 DIAGNOSIS — Z3480 Encounter for supervision of other normal pregnancy, unspecified trimester: Secondary | ICD-10-CM

## 2017-04-06 NOTE — Progress Notes (Signed)
Subjective:  Sharon Johnston is a 27 y.o. [phone] at 4010w6d being seen today for ongoing prenatal care.  She is currently monitored for the following issues for this low-risk pregnancy and has Depression; Chronic hypertension; Chronic hypertension complicating or reason for care during pregnancy; Supervision of other normal pregnancy, antepartum; and Postcoital bleeding on their problem list.  Patient reports no complaints.  Contractions: Not present. Vag. Bleeding: None.  Movement: Present. Denies leaking of fluid.   The following portions of the patient's history were reviewed and updated as appropriate: allergies, current medications, past family history, past medical history, past social history, past surgical history and problem list. Problem list updated.  Objective:   Vitals:   04/06/17 1037 04/06/17 1039  BP: (!) 143/76 129/79  Pulse: 84 76  Weight: 160 lb 9.6 oz (72.8 kg)     Fetal Status: Fetal Heart Rate (bpm): 140   Movement: Present     General:  Alert, oriented and cooperative. Patient is in no acute distress.  Skin: Skin is warm and dry. No rash noted.   Cardiovascular: Normal heart rate noted  Respiratory: Normal respiratory effort, no problems with respiration noted  Abdomen: Soft, gravid, appropriate for gestational age. Pain/Pressure: Present     Pelvic:  Cervical exam deferred        Extremities: Normal range of motion.  Edema: Trace  Mental Status: Normal mood and affect. Normal behavior. Normal judgment and thought content.   Urinalysis:      Assessment and Plan:  Pregnancy: [id number] at 6010w6d  1. Supervision of other normal pregnancy, antepartum   Preterm labor symptoms and general obstetric precautions including but not limited to vaginal bleeding, contractions, leaking of fluid and fetal movement were reviewed in detail with the patient. Please refer to After Visit Summary for other counseling recommendations.  Return in about 1 week (around 04/13/2017) for  ROB.   Brock BadHarper, Charles A, MD

## 2017-04-13 ENCOUNTER — Ambulatory Visit (INDEPENDENT_AMBULATORY_CARE_PROVIDER_SITE_OTHER): Payer: Medicaid Other | Admitting: Obstetrics

## 2017-04-13 ENCOUNTER — Encounter: Payer: Self-pay | Admitting: Obstetrics

## 2017-04-13 ENCOUNTER — Other Ambulatory Visit: Payer: Self-pay

## 2017-04-13 DIAGNOSIS — Z3480 Encounter for supervision of other normal pregnancy, unspecified trimester: Secondary | ICD-10-CM

## 2017-04-13 DIAGNOSIS — Z3483 Encounter for supervision of other normal pregnancy, third trimester: Secondary | ICD-10-CM

## 2017-04-13 NOTE — Progress Notes (Signed)
Subjective:  Donnetta [name] Kurtzman is a 27 y.o. [phone] at 2482w6d being seen today for ongoing prenatal care.  She is currently monitored for the following issues for this low-risk pregnancy and has Depression; Chronic hypertension; Chronic hypertension complicating or reason for care during pregnancy; Supervision of other normal pregnancy, antepartum; and Postcoital bleeding on their problem list.  Patient reports no complaints.  Contractions: Not present. Vag. Bleeding: None.  Movement: Present. Denies leaking of fluid.   The following portions of the patient's history were reviewed and updated as appropriate: allergies, current medications, past family history, past medical history, past social history, past surgical history and problem list. Problem list updated.  Objective:   Vitals:   04/13/17 1037  BP: 128/85  Pulse: 79  Weight: 164 lb 3.2 oz (74.5 kg)    Fetal Status: Fetal Heart Rate (bpm): 140   Movement: Present     General:  Alert, oriented and cooperative. Patient is in no acute distress.  Skin: Skin is warm and dry. No rash noted.   Cardiovascular: Normal heart rate noted  Respiratory: Normal respiratory effort, no problems with respiration noted  Abdomen: Soft, gravid, appropriate for gestational age. Pain/Pressure: Present     Pelvic:  Cervical exam deferred        Extremities: Normal range of motion.  Edema: Trace  Mental Status: Normal mood and affect. Normal behavior. Normal judgment and thought content.   Urinalysis:      Assessment and Plan:  Pregnancy: [id number] at 882w6d  1. Supervision of other normal pregnancy, antepartum - DOING WELL  Term labor symptoms and general obstetric precautions including but not limited to vaginal bleeding, contractions, leaking of fluid and fetal movement were reviewed in detail with the patient. Please refer to After Visit Summary for other counseling recommendations.  Return in about 1 week (around 04/20/2017) for ROB.   Brock BadHarper,  Charles A, MD

## 2017-04-20 ENCOUNTER — Ambulatory Visit (INDEPENDENT_AMBULATORY_CARE_PROVIDER_SITE_OTHER): Payer: Medicaid Other | Admitting: Obstetrics

## 2017-04-20 ENCOUNTER — Encounter: Payer: Self-pay | Admitting: Obstetrics

## 2017-04-20 ENCOUNTER — Encounter (HOSPITAL_COMMUNITY): Payer: Self-pay | Admitting: *Deleted

## 2017-04-20 ENCOUNTER — Inpatient Hospital Stay (HOSPITAL_COMMUNITY)
Admission: AD | Admit: 2017-04-20 | Discharge: 2017-04-20 | Disposition: A | Discharge status: Home / Self Care | Payer: Medicaid Other | Source: Ambulatory Visit | Attending: Obstetrics and Gynecology | Admitting: Obstetrics and Gynecology

## 2017-04-20 VITALS — BP 164/100 | HR 88 | Wt 164.0 lb

## 2017-04-20 DIAGNOSIS — O99333 Smoking (tobacco) complicating pregnancy, third trimester: Secondary | ICD-10-CM | POA: Insufficient documentation

## 2017-04-20 DIAGNOSIS — Z8249 Family history of ischemic heart disease and other diseases of the circulatory system: Secondary | ICD-10-CM | POA: Insufficient documentation

## 2017-04-20 DIAGNOSIS — O10913 Unspecified pre-existing hypertension complicating pregnancy, third trimester: Secondary | ICD-10-CM | POA: Diagnosis not present

## 2017-04-20 DIAGNOSIS — Z3A38 38 weeks gestation of pregnancy: Secondary | ICD-10-CM | POA: Diagnosis not present

## 2017-04-20 DIAGNOSIS — Z3480 Encounter for supervision of other normal pregnancy, unspecified trimester: Secondary | ICD-10-CM

## 2017-04-20 DIAGNOSIS — Z915 Personal history of self-harm: Secondary | ICD-10-CM | POA: Diagnosis not present

## 2017-04-20 DIAGNOSIS — O163 Unspecified maternal hypertension, third trimester: Secondary | ICD-10-CM | POA: Diagnosis not present

## 2017-04-20 DIAGNOSIS — O10919 Unspecified pre-existing hypertension complicating pregnancy, unspecified trimester: Secondary | ICD-10-CM

## 2017-04-20 DIAGNOSIS — Z87448 Personal history of other diseases of urinary system: Secondary | ICD-10-CM | POA: Insufficient documentation

## 2017-04-20 DIAGNOSIS — Z8619 Personal history of other infectious and parasitic diseases: Secondary | ICD-10-CM | POA: Diagnosis not present

## 2017-04-20 DIAGNOSIS — F17210 Nicotine dependence, cigarettes, uncomplicated: Secondary | ICD-10-CM | POA: Insufficient documentation

## 2017-04-20 DIAGNOSIS — Z818 Family history of other mental and behavioral disorders: Secondary | ICD-10-CM | POA: Diagnosis not present

## 2017-04-20 DIAGNOSIS — R51 Headache: Secondary | ICD-10-CM | POA: Diagnosis present

## 2017-04-20 LAB — CBC
HCT: 29.7 % — ABNORMAL LOW (ref 36.0–46.0)
Hemoglobin: 9.5 g/dL — ABNORMAL LOW (ref 12.0–15.0)
MCH: 26.4 pg (ref 26.0–34.0)
MCHC: 32 g/dL (ref 30.0–36.0)
MCV: 82.5 fL (ref 78.0–100.0)
PLATELETS: 243 10*3/uL (ref 150–400)
RBC: 3.6 MIL/uL — ABNORMAL LOW (ref 3.87–5.11)
RDW: 13.3 % (ref 11.5–15.5)
WBC: 6.2 10*3/uL (ref 4.0–10.5)

## 2017-04-20 LAB — POCT URINALYSIS DIPSTICK
BILIRUBIN UA: NEGATIVE
Glucose, UA: NEGATIVE
KETONES UA: NEGATIVE
Leukocytes, UA: NEGATIVE
NITRITE UA: NEGATIVE
PH UA: 6.5 (ref 5.0–8.0)
Spec Grav, UA: 1.015 (ref 1.010–1.025)

## 2017-04-20 LAB — COMPREHENSIVE METABOLIC PANEL
ALBUMIN: 3 g/dL — AB (ref 3.5–5.0)
ALT: 11 U/L — ABNORMAL LOW (ref 14–54)
ANION GAP: 6 (ref 5–15)
AST: 18 U/L (ref 15–41)
Alkaline Phosphatase: 141 U/L — ABNORMAL HIGH (ref 38–126)
BUN: 11 mg/dL (ref 6–20)
CALCIUM: 8.3 mg/dL — AB (ref 8.9–10.3)
CO2: 22 mmol/L (ref 22–32)
Chloride: 108 mmol/L (ref 101–111)
Creatinine, Ser: 0.62 mg/dL (ref 0.44–1.00)
GFR calc non Af Amer: >60 mL/min (ref >60)
GLUCOSE: 113 mg/dL — AB (ref 65–99)
POTASSIUM: 3.3 mmol/L — AB (ref 3.5–5.1)
SODIUM: 136 mmol/L (ref 135–145)
Total Bilirubin: 1.2 mg/dL (ref 0.3–1.2)
Total Protein: 7.1 g/dL (ref 6.5–8.1)

## 2017-04-20 LAB — PROTEIN / CREATININE RATIO, URINE
Creatinine, Urine: 156 mg/dL
Protein Creatinine Ratio: 0.21 mg/mg{Cre} — ABNORMAL HIGH (ref 0.00–0.15)
TOTAL PROTEIN, URINE: 33 mg/dL

## 2017-04-20 MED ORDER — BUTALBITAL-APAP-CAFFEINE 50-325-40 MG PO TABS
2.0000 | ORAL_TABLET | Freq: Once | ORAL | Status: AC
  Administered 2017-04-20: 2 via ORAL
  Filled 2017-04-20: qty 2

## 2017-04-20 NOTE — Discharge Instructions (Signed)
Preeclampsia and Eclampsia °Preeclampsia is a serious condition that develops only during pregnancy. It is also called toxemia of pregnancy. This condition causes high blood pressure along with other symptoms, such as swelling and headaches. These symptoms may develop as the condition gets worse. Preeclampsia may occur at 20 weeks of pregnancy or later. °Diagnosing and treating preeclampsia early is very important. If not treated early, it can cause serious problems for you and your baby. One problem it can lead to is eclampsia, which is a condition that causes muscle jerking or shaking (convulsions or seizures) in the mother. Delivering your baby is the best treatment for preeclampsia or eclampsia. Preeclampsia and eclampsia symptoms usually go away after your baby is born. °What are the causes? °The cause of preeclampsia is not known. °What increases the risk? °The following risk factors make you more likely to develop preeclampsia: °· Being pregnant for the first time. °· Having had preeclampsia during a past pregnancy. °· Having a family history of preeclampsia. °· Having high blood pressure. °· Being pregnant with twins or triplets. °· Being 35 or older. °· Being African-American. °· Having kidney disease or diabetes. °· Having medical conditions such as lupus or blood diseases. °· Being very overweight (obese). ° °What are the signs or symptoms? °The earliest signs of preeclampsia are: °· High blood pressure. °· Increased protein in your urine. Your health care provider will check for this at every visit before you give birth (prenatal visit). ° °Other symptoms that may develop as the condition gets worse include: °· Severe headaches. °· Sudden weight gain. °· Swelling of the hands, face, legs, and feet. °· Nausea and vomiting. °· Vision problems, such as blurred or double vision. °· Numbness in the face, arms, legs, and feet. °· Urinating less than usual. °· Dizziness. °· Slurred speech. °· Abdominal pain,  especially upper abdominal pain. °· Convulsions or seizures. ° °Symptoms generally go away after giving birth. °How is this diagnosed? °There are no screening tests for preeclampsia. Your health care provider will ask you about symptoms and check for signs of preeclampsia during your prenatal visits. You may also have tests that include: °· Urine tests. °· Blood tests. °· Checking your blood pressure. °· Monitoring your baby’s heart rate. °· Ultrasound. ° °How is this treated? °You and your health care provider will determine the treatment approach that is best for you. Treatment may include: °· Having more frequent prenatal exams to check for signs of preeclampsia, if you have an increased risk for preeclampsia. °· Bed rest. °· Reducing how much salt (sodium) you eat. °· Medicine to lower your blood pressure. °· Staying in the hospital, if your condition is severe. There, treatment will focus on controlling your blood pressure and the amount of fluids in your body (fluid retention). °· You may need to take medicine (magnesium sulfate) to prevent seizures. This medicine may be given as an injection or through an IV tube. °· Delivering your baby early, if your condition gets worse. You may have your labor started with medicine (induced), or you may have a cesarean delivery. ° °Follow these instructions at home: °Eating and drinking ° °· Drink enough fluid to keep your urine clear or pale yellow. °· Eat a healthy diet that is low in sodium. Do not add salt to your food. Check nutrition labels to see how much sodium a food or beverage contains. °· Avoid caffeine. °Lifestyle °· Do not use any products that contain nicotine or tobacco, such as cigarettes   and e-cigarettes. If you need help quitting, ask your health care provider. °· Do not use alcohol or drugs. °· Avoid stress as much as possible. Rest and get plenty of sleep. °General instructions °· Take over-the-counter and prescription medicines only as told by your  health care provider. °· When lying down, lie on your side. This keeps pressure off of your baby. °· When sitting or lying down, raise (elevate) your feet. Try putting some pillows underneath your lower legs. °· Exercise regularly. Ask your health care provider what kinds of exercise are best for you. °· Keep all follow-up and prenatal visits as told by your health care provider. This is important. °How is this prevented? °To prevent preeclampsia or eclampsia from developing during another pregnancy: °· Get proper medical care during pregnancy. Your health care provider may be able to prevent preeclampsia or diagnose and treat it early. °· Your health care provider may have you take a low-dose aspirin or a calcium supplement during your next pregnancy. °· You may have tests of your blood pressure and kidney function after giving birth. °· Maintain a healthy weight. Ask your health care provider for help managing weight gain during pregnancy. °· Work with your health care provider to manage any long-term (chronic) health conditions you have, such as diabetes or kidney problems. ° °Contact a health care provider if: °· You gain more weight than expected. °· You have headaches. °· You have nausea or vomiting. °· You have abdominal pain. °· You feel dizzy or light-headed. °Get help right away if: °· You develop sudden or severe swelling anywhere in your body. This usually happens in the legs. °· You gain 5 lbs (2.3 kg) or more during one week. °· You have severe: °? Abdominal pain. °? Headaches. °? Dizziness. °? Vision problems. °? Confusion. °? Nausea or vomiting. °· You have a seizure. °· You have trouble moving any part of your body. °· You develop numbness in any part of your body. °· You have trouble speaking. °· You have any abnormal bleeding. °· You pass out. °This information is not intended to replace advice given to you by your health care provider. Make sure you discuss any questions you have with your health  care provider. °Document Released: 03/04/2000 Document Revised: 11/03/2015 Document Reviewed: 10/12/2015 °Elsevier Interactive Patient Education © 2018 [employer] ° °

## 2017-04-20 NOTE — MAU Note (Signed)
Pt sent from office , reports a headache at this time, b/p elevated in office

## 2017-04-20 NOTE — Progress Notes (Signed)
Subjective:  Sharon Johnston is a 27 y.o. [phone] at 4866w6d being seen today for ongoing prenatal care.  She is currently monitored for the following issues for this low-risk pregnancy and has Depression; Chronic hypertension; Chronic hypertension complicating or reason for care during pregnancy; Supervision of other normal pregnancy, antepartum; and Postcoital bleeding on their problem list.  Patient reports no complaints.  Contractions: Not present. Vag. Bleeding: None.  Movement: Present. Denies leaking of fluid.   The following portions of the patient's history were reviewed and updated as appropriate: allergies, current medications, past family history, past medical history, past social history, past surgical history and problem list. Problem list updated.  Objective:   Vitals:   04/20/17 1116 04/20/17 1147  BP: (!) 161/98 (!) 164/100  Pulse: 88   Weight: 164 lb (74.4 kg)     Fetal Status: Fetal Heart Rate (bpm): 140   Movement: Present     General:  Alert, oriented and cooperative. Patient is in no acute distress.  Skin: Skin is warm and dry. No rash noted.   Cardiovascular: Normal heart rate noted  Respiratory: Normal respiratory effort, no problems with respiration noted  Abdomen: Soft, gravid, appropriate for gestational age. Pain/Pressure: Present     Pelvic:  Cervical exam deferred        Extremities: Normal range of motion.  Edema: Trace  Mental Status: Normal mood and affect. Normal behavior. Normal judgment and thought content.   Urinalysis:      Assessment and Plan:  Pregnancy: [id number] at 1066w6d  1. Supervision of other normal pregnancy, antepartum   2. Elevated blood pressure affecting pregnancy in third trimester, antepartum Rx: - POCT Urinalysis Dipstick Results for Ollen BargesDAVIS, Rhondalyn N (MRN [id number]) as of 04/20/2017 11:53  Ref. Range 04/20/2017 11:27  Bilirubin, UA Unknown NEGATIVE  Clarity, UA Unknown cloudy  Color, UA Unknown yellow  Glucose Unknown NEGATIVE   Ketones, UA Unknown NEGATIVE  Leukocytes, UA Latest Ref Range: Negative  Negative  Nitrite, UA Unknown Neg  pH, UA Latest Ref Range: 5.0 - 8.0  6.5  Protein, UA Unknown Mod  Specific Gravity, UA Latest Ref Range: 1.010 - 1.025  1.015  RBC, UA Unknown LARGE   [facility]  Term labor symptoms and general obstetric precautions including but not limited to vaginal bleeding, contractions, leaking of fluid and fetal movement were reviewed in detail with the patient. Please refer to After Visit Summary for other counseling recommendations.  Return in about 1 week (around 04/27/2017) for ROB.   Brock BadHarper, Charles A, MD

## 2017-04-20 NOTE — MAU Provider Note (Signed)
History     CSN: [id number]  Arrival date and time: 04/20/17 1315   First Provider Initiated Contact with Patient 04/20/17 1345     Chief Complaint  Patient presents with  . Hypertension  . Headache   HPI Sharon Johnston is a 27 y.o. [phone] at 5232w6d who presents from the clinic for evaluation of elevated blood pressures. She has CHTN, no meds, and had blood pressures in the 160s/100s in the office. She reports a headache that she rates a 8/10 and has not tried anything for the pain. She denies any epigastric pain or visual changes.   OB History    Gravida Para Term Preterm AB Living   6 3 3  0 2 3   SAB TAB Ectopic Multiple Live Births   1 1 0   3      Past Medical History:  Diagnosis Date  . Chlamydia   . Depression    hospitalized for 2 weeks after suicide att  . Hx of gonorrhea   . Hypertension    gestational HTN  . Hypertension   . Kidney infection     Past Surgical History:  Procedure Laterality Date  . NO PAST SURGERIES      Family History  Problem Relation Age of Onset  . Hypertension Mother   . Hypertension Maternal Grandmother   . Hypertension Maternal Aunt   . Depression Cousin     Social History   Tobacco Use  . Smoking status: Current Every Day Smoker    Packs/day: 0.25    Types: Cigarettes  . Smokeless tobacco: Current User  Substance Use Topics  . Alcohol use: No    Frequency: Never  . Drug use: No    Allergies: No Known Allergies  Medications Prior to Admission  Medication Sig Dispense Refill Last Dose  . acetaminophen (TYLENOL) 500 MG tablet Take 1 tablet (500 mg total) by mouth every 6 (six) hours as needed. (Patient not taking: Reported on 04/13/2017) 30 tablet 0 Not Taking    Review of Systems  Constitutional: Negative.  Negative for fatigue and fever.  HENT: Negative.   Eyes: Negative for visual disturbance.  Respiratory: Negative.  Negative for shortness of breath.   Cardiovascular: Negative.  Negative for chest pain.   Gastrointestinal: Negative.  Negative for abdominal pain, constipation, diarrhea, nausea and vomiting.  Genitourinary: Negative.  Negative for dysuria, vaginal bleeding and vaginal discharge.  Neurological: Positive for headaches. Negative for dizziness.   Physical Exam   Blood pressure 135/85, pulse 97, temperature 98.5 F (36.9 C), temperature source Oral, resp. rate 16, height 5\' 2"  (1.575 m), weight 166 lb (75.3 kg), last menstrual period 07/22/2016, SpO2 99 %.  Physical Exam  Nursing note and vitals reviewed. Constitutional: She is oriented to person, place, and time. She appears well-developed and well-nourished. No distress.  HENT:  Head: Normocephalic.  Eyes: Pupils are equal, round, and reactive to light.  Cardiovascular: Normal rate, regular rhythm and normal heart sounds.  Respiratory: Effort normal and breath sounds normal. No respiratory distress.  GI: Soft. Bowel sounds are normal. She exhibits no distension. There is no tenderness.  Neurological: She is alert and oriented to person, place, and time. She has normal reflexes.  Skin: Skin is warm and dry.  Psychiatric: She has a normal mood and affect. Her behavior is normal. Judgment and thought content normal.   Patient Vitals for the past 24 hrs:  BP Temp Temp src Pulse Resp SpO2 Height Weight  04/20/17 1437  135/77 - - 86 - - - -  04/20/17 1430 (!) 126/103 - - (!) 105 - - - -  04/20/17 1415 133/72 - - 75 - - - -  04/20/17 1401 (!) 146/96 - - 83 - - - -  04/20/17 1346 (!) 141/80 - - 81 - - - -  04/20/17 1342 (!) 143/79 - - 79 - - - -  04/20/17 1321 135/85 98.5 F (36.9 C) Oral 97 16 99 % 5\' 2"  (1.575 m) 166 lb (75.3 kg)     MAU Course  Procedures Results for orders placed or performed during the hospital encounter of 04/20/17 (from the past 24 hour(s))  CBC     Status: Abnormal   Collection Time: 04/20/17  1:23 PM  Result Value Ref Range   WBC 6.2 4.0 - 10.5 K/uL   RBC 3.60 (L) 3.87 - 5.11 MIL/uL    Hemoglobin 9.5 (L) 12.0 - 15.0 g/dL   HCT 16.1 (L) 09.6 - 04.5 %   MCV 82.5 78.0 - 100.0 fL   MCH 26.4 26.0 - 34.0 pg   MCHC 32.0 30.0 - 36.0 g/dL   RDW 40.9 81.1 - 91.4 %   Platelets 243 150 - 400 K/uL  Comprehensive metabolic panel     Status: Abnormal   Collection Time: 04/20/17  1:23 PM  Result Value Ref Range   Sodium 136 135 - 145 mmol/L   Potassium 3.3 (L) 3.5 - 5.1 mmol/L   Chloride 108 101 - 111 mmol/L   CO2 22 22 - 32 mmol/L   Glucose, Bld 113 (H) 65 - 99 mg/dL   BUN 11 6 - 20 mg/dL   Creatinine, Ser 7.82 0.44 - 1.00 mg/dL   Calcium 8.3 (L) 8.9 - 10.3 mg/dL   Total Protein 7.1 6.5 - 8.1 g/dL   Albumin 3.0 (L) 3.5 - 5.0 g/dL   AST 18 15 - 41 U/L   ALT 11 (L) 14 - 54 U/L   Alkaline Phosphatase 141 (H) 38 - 126 U/L   Total Bilirubin 1.2 0.3 - 1.2 mg/dL   GFR calc non Af Amer >60 >60 mL/min   GFR calc Af Amer >60 >60 mL/min   Anion gap 6 5 - 15   MDM UA CBC, CMP, protein/creat ratio Consulted with Dr. Jolayne Panther- ok to discharge patient home with follow up in office Monday for BP check and NST Will schedule induction at 40 weeks.   Assessment and Plan   1. Chronic hypertension during pregnancy, antepartum    -Discharge home in stable condition -Preeclampsia precautions discussed -Patient advised to follow-up with [facility] on Monday for a BP check and NST -IOL scheduled for 2/8 at 0700 -Patient may return to MAU as needed or if her condition were to change or worsen  Rolm Bookbinder CNM 04/20/2017, 1:58 PM

## 2017-04-20 NOTE — Progress Notes (Signed)
Elevated B/P Pt denies any visual changes , no complaints of dizziness and no HA's Urine obtained   Repeat B/P manual :164/100 Left arm

## 2017-04-21 ENCOUNTER — Encounter (HOSPITAL_COMMUNITY): Payer: Self-pay | Admitting: *Deleted

## 2017-04-21 ENCOUNTER — Telehealth (HOSPITAL_COMMUNITY): Payer: Self-pay | Admitting: *Deleted

## 2017-04-21 NOTE — Telephone Encounter (Signed)
Preadmission screen  

## 2017-04-22 ENCOUNTER — Encounter (HOSPITAL_COMMUNITY): Payer: Self-pay

## 2017-04-22 ENCOUNTER — Other Ambulatory Visit: Payer: Self-pay | Admitting: Obstetrics and Gynecology

## 2017-04-22 ENCOUNTER — Other Ambulatory Visit: Payer: Self-pay

## 2017-04-22 ENCOUNTER — Inpatient Hospital Stay (HOSPITAL_COMMUNITY)
Admission: AD | Admit: 2017-04-22 | Discharge: 2017-04-25 | DRG: 807 | Disposition: A | Discharge status: Home / Self Care | Payer: Medicaid Other | Source: Ambulatory Visit | Attending: Obstetrics & Gynecology | Admitting: Obstetrics & Gynecology

## 2017-04-22 DIAGNOSIS — D649 Anemia, unspecified: Secondary | ICD-10-CM | POA: Diagnosis present

## 2017-04-22 DIAGNOSIS — I10 Essential (primary) hypertension: Secondary | ICD-10-CM | POA: Diagnosis present

## 2017-04-22 DIAGNOSIS — O10913 Unspecified pre-existing hypertension complicating pregnancy, third trimester: Secondary | ICD-10-CM

## 2017-04-22 DIAGNOSIS — Z87891 Personal history of nicotine dependence: Secondary | ICD-10-CM

## 2017-04-22 DIAGNOSIS — N930 Postcoital and contact bleeding: Secondary | ICD-10-CM

## 2017-04-22 DIAGNOSIS — O1002 Pre-existing essential hypertension complicating childbirth: Principal | ICD-10-CM | POA: Diagnosis present

## 2017-04-22 DIAGNOSIS — Z3A39 39 weeks gestation of pregnancy: Secondary | ICD-10-CM

## 2017-04-22 DIAGNOSIS — O9902 Anemia complicating childbirth: Secondary | ICD-10-CM | POA: Diagnosis present

## 2017-04-22 LAB — POCT FERN TEST: POCT FERN TEST: NEGATIVE

## 2017-04-22 NOTE — Progress Notes (Addendum)
G6P3 @ 39.[redacted] wksga. Arrived via EMS for r/o labor. Here dt ROM at 2045 clear. Denies bleeding. +FM  EFM applied   SVE: ft/40/ballotable. White discharge noted only on glove. Fern test done [employer] [facility] [employer] pt stating possibly ROM.   Hx of PIH and current pregn being monitored for increasing BP  2314: [facility]

## 2017-04-22 NOTE — MAU Provider Note (Signed)
Chief Complaint:  Labor Eval and Rupture of Membranes   First Provider Initiated Contact with Patient 04/22/17 2353      HPI: Sharon Johnston is a 27 y.o. [id number] at 6w1dwho presents to maternity admissions reporting leakage of fluid and contractions with onset today.  She reports clear vaginal discharge/fluid at 2045, not enough to require a pad but enough to soak her underwear.  Contractions start low in abdomen and radiate to low back as tightening pain and occur every 10 minutes.  She has CHTN with hx of GHTN in previous pregnancies but has not required medications during this pregnancy.  She has planned IOL at 40 weeks for [facility] without meds.  She was sent from the office on 1/31 for severe range BPs but had borderline BP and normal labs in MAU at 38 weeks so was discharged. She denies any h/a, epigastric pain, or visual disturbances today.  There are no other associated symptoms. She has not tried any treatments for pain, LOF, or HTN. She reports good fetal movement, denies vaginal bleeding, vaginal itching/burning, urinary symptoms, dizziness, n/v, or fever/chills.    HPI  Past Medical History: Past Medical History:  Diagnosis Date  . Chlamydia   . Depression    hospitalized for 2 weeks after suicide att was post partum  . Hx of gonorrhea   . Hypertension    gestational HTN  . Hypertension   . Kidney infection     Past obstetric history: OB History  Gravida Para Term Preterm AB Living  6 3 3  0 2 3  SAB TAB Ectopic Multiple Live Births  1 1 0   3    # Outcome Date GA Lbr Len/2nd Weight Sex Delivery Anes PTL Lv  6 Current           5 TAB 2017 [redacted]w[redacted]d         4 Term 10/08/14 [redacted]w[redacted]d 02:40 / 00:04 5 lb 14.4 oz (2.676 kg) F Vag-Spont None  LIV  3 Term 08/11/13 [redacted]w[redacted]d 13:48 / 00:08 7 lb 3.7 oz (3.28 kg) F Vag-Spont EPI  LIV  2 Term 10/18/11 [redacted]w[redacted]d 12:30 / 00:30 7 lb 8 oz (3.402 kg) M Vag-Spont EPI  LIV  1 SAB 2013 [redacted]w[redacted]d            Birth Comments: System Generated. Please review and  update pregnancy details.      Past Surgical History: Past Surgical History:  Procedure Laterality Date  . NO PAST SURGERIES      Family History: Family History  Problem Relation Age of Onset  . Hypertension Mother   . Hypertension Maternal Grandmother   . Hypertension Maternal Aunt   . Depression Cousin     Social History: Social History   Tobacco Use  . Smoking status: Former Smoker    Packs/day: 0.25    Types: Cigarettes    Last attempt to quit: 03/21/2017    Years since quitting: 0.0  . Smokeless tobacco: Never Used  Substance Use Topics  . Alcohol use: No    Frequency: Never  . Drug use: No    Allergies: No Known Allergies  Meds:  No medications prior to admission.    ROS:  Review of Systems  Constitutional: Negative for chills, fatigue and fever.  Eyes: Negative for visual disturbance.  Respiratory: Negative for shortness of breath.   Cardiovascular: Negative for chest pain.  Gastrointestinal: Positive for abdominal pain. Negative for nausea and vomiting.  Genitourinary: Positive for pelvic pain and vaginal  discharge. Negative for difficulty urinating, dysuria, flank pain, vaginal bleeding and vaginal pain.  Musculoskeletal: Positive for back pain.  Neurological: Negative for dizziness and headaches.  Psychiatric/Behavioral: Negative.      I have reviewed patient's Past Medical Hx, Surgical Hx, Family Hx, Social Hx, medications and allergies.   Physical Exam   Patient Vitals for the past 24 hrs:  BP Temp Temp src Pulse Resp SpO2 Height Weight  04/23/17 0019 - - - - - 100 % - -  04/23/17 0015 (!) 147/111 - - 78 - - - -  04/23/17 0014 - - - - - 100 % - -  04/23/17 0000 (!) 153/93 - - 93 - - - -  04/22/17 2345 (!) 152/89 - - 74 - - - -  04/22/17 2330 (!) 148/86 - - 72 - - - -  04/22/17 2325 - - - - - 100 % - -  04/22/17 2320 - - - - - 99 % - -  04/22/17 2316 140/89 - - 84 - - - -  04/22/17 2315 - - - - - 100 % - -  04/22/17 2259 134/76 - - 69 -  99 % - -  04/22/17 2252 (!) 142/82 - - 87 - 100 % - -  04/22/17 2248 (!) 151/89 - - 75 - - - -  04/22/17 2247 - - - - - 100 % - -  04/22/17 2242 - - - - - 100 % - -  04/22/17 2235 - - - - - 100 % - -  04/22/17 2233 - 98.7 F (37.1 C) Oral - 18 - 5\' 2"  (1.575 m) 165 lb (74.8 kg)   Constitutional: Well-developed, well-nourished female in no acute distress.  HEART: normal rate, heart sounds, regular rhythm RESP: normal effort, lung sounds clear and equal bilaterally GI: Abd soft, non-tender, gravid appropriate for gestational age.  MS: Extremities nontender, no edema, normal ROM Neurologic: Alert and oriented x 4.  GU: Neg CVAT.  PELVIC EXAM: Cervix pink, visually closed, without lesion, moderate amount white creamy discharge, no pooling of fluid with valsalva, vaginal walls and external genitalia normal  Fern slide collected  Dilation: Fingertip Effacement (%): 50 Cervical Position: Posterior Station: High Presentation: Vertex Exam by:: Leftwich-Kirby, CNM  FHT:  Baseline 140 , moderate variability, accelerations present, no decelerations Contractions: None on toco or to palpation   Labs: Results for orders placed or performed during the hospital encounter of 04/22/17 (from the past 24 hour(s))  Fern Test     Status: Normal   Collection Time: 04/22/17 11:02 PM  Result Value Ref Range   POCT Fern Test Negative = intact amniotic membranes   CBC     Status: Abnormal   Collection Time: 04/23/17 12:03 AM  Result Value Ref Range   WBC 7.2 4.0 - 10.5 K/uL   RBC 3.38 (L) 3.87 - 5.11 MIL/uL   Hemoglobin 8.9 (L) 12.0 - 15.0 g/dL   HCT 14.728.0 (L) 82.936.0 - 56.246.0 %   MCV 82.8 78.0 - 100.0 fL   MCH 26.3 26.0 - 34.0 pg   MCHC 31.8 30.0 - 36.0 g/dL   RDW 13.013.3 86.511.5 - 78.415.5 %   Platelets 220 150 - 400 K/uL   A/Positive/-- (09/18 1005)  Imaging:  No results found.  MAU Course/MDM: HTN again noted on today's visit with BPs 140s/80s and 150s/80s Preeclampsia labs ordered with CBC, CMP,  P/C ratio and UA NST reviewed and reactive Pt with CHTN but has not required  meds for BP during this pregnancy. She has new onset HTN since last week.  BPs are borderline, not severe range but she is consistently hypertensive at [redacted]w[redacted]d. No evidence of labor with negative pooling/ferning and cervix FT/50/high Consult Dr Vergie Living with presentation, exam findings and test results.  Admit for IOL for CHTN, ? Superimposed preeclampsia with labs pending   Assessment: 1. Chronic hypertension in obstetric context in third trimester   2. GBS negative on 03/30/17  Plan: Admit for IOL for CHTN, ? Superimposed preeclampsia Cervical ripening with Cytotec Anticipate NSVD  Allergies as of 04/23/2017   No Known Allergies     Medication List    You have not been prescribed any medications.     Sharen Counter Certified Nurse-Midwife 04/23/2017 12:32 AM

## 2017-04-22 NOTE — MAU Note (Signed)
Took over care of patient at this time

## 2017-04-23 ENCOUNTER — Inpatient Hospital Stay (HOSPITAL_COMMUNITY): Payer: Medicaid Other | Admitting: Anesthesiology

## 2017-04-23 ENCOUNTER — Encounter (HOSPITAL_COMMUNITY): Payer: Self-pay | Admitting: *Deleted

## 2017-04-23 DIAGNOSIS — O134 Gestational [pregnancy-induced] hypertension without significant proteinuria, complicating childbirth: Secondary | ICD-10-CM

## 2017-04-23 DIAGNOSIS — O9902 Anemia complicating childbirth: Secondary | ICD-10-CM | POA: Diagnosis present

## 2017-04-23 DIAGNOSIS — O1002 Pre-existing essential hypertension complicating childbirth: Secondary | ICD-10-CM | POA: Diagnosis present

## 2017-04-23 DIAGNOSIS — Z87891 Personal history of nicotine dependence: Secondary | ICD-10-CM | POA: Diagnosis not present

## 2017-04-23 DIAGNOSIS — Z3A39 39 weeks gestation of pregnancy: Secondary | ICD-10-CM

## 2017-04-23 DIAGNOSIS — D649 Anemia, unspecified: Secondary | ICD-10-CM | POA: Diagnosis present

## 2017-04-23 LAB — URINALYSIS, ROUTINE W REFLEX MICROSCOPIC
Bilirubin Urine: NEGATIVE
Glucose, UA: NEGATIVE mg/dL
Hgb urine dipstick: NEGATIVE
KETONES UR: NEGATIVE mg/dL
Nitrite: NEGATIVE
PH: 7 (ref 5.0–8.0)
PROTEIN: NEGATIVE mg/dL
Specific Gravity, Urine: 1.012 (ref 1.005–1.030)

## 2017-04-23 LAB — COMPREHENSIVE METABOLIC PANEL
ALBUMIN: 2.8 g/dL — AB (ref 3.5–5.0)
ALK PHOS: 136 U/L — AB (ref 38–126)
ALK PHOS: 144 U/L — AB (ref 38–126)
ALT: 10 U/L — AB (ref 14–54)
ALT: 10 U/L — ABNORMAL LOW (ref 14–54)
ANION GAP: 6 (ref 5–15)
AST: 16 U/L (ref 15–41)
AST: 24 U/L (ref 15–41)
Albumin: 2.9 g/dL — ABNORMAL LOW (ref 3.5–5.0)
Anion gap: 7 (ref 5–15)
BILIRUBIN TOTAL: 0.7 mg/dL (ref 0.3–1.2)
BILIRUBIN TOTAL: 0.9 mg/dL (ref 0.3–1.2)
BUN: 10 mg/dL (ref 6–20)
BUN: 7 mg/dL (ref 6–20)
CALCIUM: 8.5 mg/dL — AB (ref 8.9–10.3)
CALCIUM: 8.5 mg/dL — AB (ref 8.9–10.3)
CO2: 21 mmol/L — ABNORMAL LOW (ref 22–32)
CO2: 22 mmol/L (ref 22–32)
CREATININE: 0.61 mg/dL (ref 0.44–1.00)
Chloride: 108 mmol/L (ref 101–111)
Chloride: 108 mmol/L (ref 101–111)
Creatinine, Ser: 0.6 mg/dL (ref 0.44–1.00)
GFR calc Af Amer: >60 mL/min (ref >60)
GFR calc non Af Amer: >60 mL/min (ref >60)
GLUCOSE: 79 mg/dL (ref 65–99)
GLUCOSE: 85 mg/dL (ref 65–99)
POTASSIUM: 3.4 mmol/L — AB (ref 3.5–5.1)
Potassium: 3.6 mmol/L (ref 3.5–5.1)
SODIUM: 137 mmol/L (ref 135–145)
Sodium: 135 mmol/L (ref 135–145)
TOTAL PROTEIN: 6.3 g/dL — AB (ref 6.5–8.1)
TOTAL PROTEIN: 6.6 g/dL (ref 6.5–8.1)

## 2017-04-23 LAB — TYPE AND SCREEN
ABO/RH(D): A POS
ANTIBODY SCREEN: NEGATIVE

## 2017-04-23 LAB — CBC
HCT: 27.1 % — ABNORMAL LOW (ref 36.0–46.0)
HEMATOCRIT: 28 % — AB (ref 36.0–46.0)
HEMOGLOBIN: 8.9 g/dL — AB (ref 12.0–15.0)
Hemoglobin: 8.8 g/dL — ABNORMAL LOW (ref 12.0–15.0)
MCH: 26.3 pg (ref 26.0–34.0)
MCH: 26.6 pg (ref 26.0–34.0)
MCHC: 31.8 g/dL (ref 30.0–36.0)
MCHC: 32.5 g/dL (ref 30.0–36.0)
MCV: 81.9 fL (ref 78.0–100.0)
MCV: 82.8 fL (ref 78.0–100.0)
PLATELETS: 197 10*3/uL (ref 150–400)
Platelets: 220 10*3/uL (ref 150–400)
RBC: 3.31 MIL/uL — ABNORMAL LOW (ref 3.87–5.11)
RBC: 3.38 MIL/uL — ABNORMAL LOW (ref 3.87–5.11)
RDW: 13.2 % (ref 11.5–15.5)
RDW: 13.3 % (ref 11.5–15.5)
WBC: 10.6 10*3/uL — ABNORMAL HIGH (ref 4.0–10.5)
WBC: 7.2 10*3/uL (ref 4.0–10.5)

## 2017-04-23 LAB — PROTEIN / CREATININE RATIO, URINE
CREATININE, URINE: 101 mg/dL
CREATININE, URINE: 42 mg/dL
PROTEIN CREATININE RATIO: 0.14 mg/mg{creat} (ref 0.00–0.15)
Protein Creatinine Ratio: 0.17 mg/mg{Cre} — ABNORMAL HIGH (ref 0.00–0.15)
Total Protein, Urine: 14 mg/dL
Total Protein, Urine: 7 mg/dL

## 2017-04-23 LAB — RPR: RPR Ser Ql: NONREACTIVE

## 2017-04-23 MED ORDER — EPHEDRINE 5 MG/ML INJ
10.0000 mg | INTRAVENOUS | Status: DC | PRN
  Filled 2017-04-23: qty 2

## 2017-04-23 MED ORDER — TERBUTALINE SULFATE 1 MG/ML IJ SOLN
0.2500 mg | Freq: Once | INTRAMUSCULAR | Status: DC | PRN
  Filled 2017-04-23: qty 1

## 2017-04-23 MED ORDER — SIMETHICONE 80 MG PO CHEW: 80.0000 mg | CHEWABLE_TABLET | ORAL | Status: DC | PRN

## 2017-04-23 MED ORDER — ONDANSETRON HCL 4 MG/2ML IJ SOLN: 4.0000 mg | INTRAMUSCULAR | Status: DC | PRN

## 2017-04-23 MED ORDER — MISOPROSTOL 200 MCG PO TABS
800.0000 ug | ORAL_TABLET | Freq: Once | ORAL | Status: AC
  Administered 2017-04-23: 800 ug via BUCCAL

## 2017-04-23 MED ORDER — MISOPROSTOL 50MCG HALF TABLET
50.0000 ug | ORAL_TABLET | ORAL | Status: DC | PRN
  Administered 2017-04-23: 50 ug via BUCCAL
  Filled 2017-04-23 (×3): qty 1

## 2017-04-23 MED ORDER — LACTATED RINGERS IV SOLN: 500.0000 mL | Freq: Once | INTRAVENOUS | Status: DC

## 2017-04-23 MED ORDER — OXYTOCIN 40 UNITS IN LACTATED RINGERS INFUSION - SIMPLE MED
2.5000 [IU]/h | INTRAVENOUS | Status: DC
  Filled 2017-04-23: qty 1000

## 2017-04-23 MED ORDER — SOD CITRATE-CITRIC ACID 500-334 MG/5ML PO SOLN: 30.0000 mL | ORAL | Status: DC | PRN

## 2017-04-23 MED ORDER — MISOPROSTOL 200 MCG PO TABS
ORAL_TABLET | ORAL | Status: AC
  Filled 2017-04-23: qty 4

## 2017-04-23 MED ORDER — DIPHENHYDRAMINE HCL 25 MG PO CAPS: 25.0000 mg | ORAL_CAPSULE | Freq: Four times a day (QID) | ORAL | Status: DC | PRN

## 2017-04-23 MED ORDER — ONDANSETRON HCL 4 MG/2ML IJ SOLN: 4.0000 mg | Freq: Four times a day (QID) | INTRAMUSCULAR | Status: DC | PRN

## 2017-04-23 MED ORDER — OXYCODONE-ACETAMINOPHEN 5-325 MG PO TABS: 2.0000 | ORAL_TABLET | ORAL | Status: DC | PRN

## 2017-04-23 MED ORDER — WITCH HAZEL-GLYCERIN EX PADS: 1.0000 "application " | MEDICATED_PAD | CUTANEOUS | Status: DC | PRN

## 2017-04-23 MED ORDER — DIPHENHYDRAMINE HCL 50 MG/ML IJ SOLN: 12.5000 mg | INTRAMUSCULAR | Status: DC | PRN

## 2017-04-23 MED ORDER — ACETAMINOPHEN 325 MG PO TABS: 650.0000 mg | ORAL_TABLET | ORAL | Status: DC | PRN

## 2017-04-23 MED ORDER — OXYTOCIN BOLUS FROM INFUSION
500.0000 mL | Freq: Once | INTRAVENOUS | Status: AC
  Administered 2017-04-23: 500 mL via INTRAVENOUS

## 2017-04-23 MED ORDER — HYDRALAZINE HCL 20 MG/ML IJ SOLN: 10.0000 mg | Freq: Once | INTRAMUSCULAR | Status: DC

## 2017-04-23 MED ORDER — FENTANYL 2.5 MCG/ML BUPIVACAINE 1/10 % EPIDURAL INFUSION (WH - ANES)
14.0000 mL/h | INTRAMUSCULAR | Status: DC | PRN
  Administered 2017-04-23: 12 mL/h via EPIDURAL

## 2017-04-23 MED ORDER — LACTATED RINGERS IV SOLN
INTRAVENOUS | Status: DC
  Administered 2017-04-23 (×2): via INTRAVENOUS

## 2017-04-23 MED ORDER — INFLUENZA VAC SPLIT QUAD 0.5 ML IM SUSY: 0.5000 mL | PREFILLED_SYRINGE | INTRAMUSCULAR | Status: DC

## 2017-04-23 MED ORDER — LABETALOL HCL 5 MG/ML IV SOLN: 20.0000 mg | INTRAVENOUS | Status: DC | PRN

## 2017-04-23 MED ORDER — IBUPROFEN 600 MG PO TABS
600.0000 mg | ORAL_TABLET | Freq: Four times a day (QID) | ORAL | Status: DC
  Administered 2017-04-23 – 2017-04-25 (×8): 600 mg via ORAL
  Filled 2017-04-23 (×8): qty 1

## 2017-04-23 MED ORDER — LACTATED RINGERS IV SOLN: 500.0000 mL | INTRAVENOUS | Status: DC | PRN

## 2017-04-23 MED ORDER — PHENYLEPHRINE 40 MCG/ML (10ML) SYRINGE FOR IV PUSH (FOR BLOOD PRESSURE SUPPORT)
80.0000 ug | PREFILLED_SYRINGE | INTRAVENOUS | Status: DC | PRN
  Filled 2017-04-23: qty 5

## 2017-04-23 MED ORDER — COCONUT OIL OIL: 1.0000 "application " | TOPICAL_OIL | Status: DC | PRN

## 2017-04-23 MED ORDER — DIBUCAINE 1 % RE OINT: 1.0000 "application " | TOPICAL_OINTMENT | RECTAL | Status: DC | PRN

## 2017-04-23 MED ORDER — HYDRALAZINE HCL 20 MG/ML IJ SOLN: 10.0000 mg | Freq: Once | INTRAMUSCULAR | Status: DC | PRN

## 2017-04-23 MED ORDER — SODIUM CHLORIDE 0.9 % IV SOLN: 250.0000 mL | INTRAVENOUS | Status: DC | PRN

## 2017-04-23 MED ORDER — ACETAMINOPHEN 500 MG PO TABS
1000.0000 mg | ORAL_TABLET | Freq: Once | ORAL | Status: AC
  Administered 2017-04-23: 1000 mg via ORAL
  Filled 2017-04-23: qty 2

## 2017-04-23 MED ORDER — LIDOCAINE HCL (PF) 1 % IJ SOLN
30.0000 mL | INTRAMUSCULAR | Status: DC | PRN
  Filled 2017-04-23: qty 30

## 2017-04-23 MED ORDER — FLEET ENEMA 7-19 GM/118ML RE ENEM: 1.0000 | ENEMA | RECTAL | Status: DC | PRN

## 2017-04-23 MED ORDER — PRENATAL MULTIVITAMIN CH
1.0000 | ORAL_TABLET | Freq: Every day | ORAL | Status: DC
  Administered 2017-04-24 – 2017-04-25 (×2): 1 via ORAL
  Filled 2017-04-23 (×2): qty 1

## 2017-04-23 MED ORDER — ONDANSETRON HCL 4 MG PO TABS: 4.0000 mg | ORAL_TABLET | ORAL | Status: DC | PRN

## 2017-04-23 MED ORDER — OXYCODONE-ACETAMINOPHEN 5-325 MG PO TABS: 1.0000 | ORAL_TABLET | ORAL | Status: DC | PRN

## 2017-04-23 MED ORDER — PHENYLEPHRINE 40 MCG/ML (10ML) SYRINGE FOR IV PUSH (FOR BLOOD PRESSURE SUPPORT)
PREFILLED_SYRINGE | INTRAVENOUS | Status: AC
  Filled 2017-04-23: qty 20

## 2017-04-23 MED ORDER — TETANUS-DIPHTH-ACELL PERTUSSIS 5-2.5-18.5 LF-MCG/0.5 IM SUSP: 0.5000 mL | Freq: Once | INTRAMUSCULAR | Status: DC

## 2017-04-23 MED ORDER — AMLODIPINE BESYLATE 10 MG PO TABS
10.0000 mg | ORAL_TABLET | Freq: Every day | ORAL | Status: DC
  Administered 2017-04-24 – 2017-04-25 (×2): 10 mg via ORAL
  Filled 2017-04-23 (×3): qty 1

## 2017-04-23 MED ORDER — SODIUM CHLORIDE 0.9% FLUSH: 3.0000 mL | INTRAVENOUS | Status: DC | PRN

## 2017-04-23 MED ORDER — AMLODIPINE BESYLATE 5 MG PO TABS
5.0000 mg | ORAL_TABLET | Freq: Every day | ORAL | Status: DC
  Administered 2017-04-23: 5 mg via ORAL
  Filled 2017-04-23 (×2): qty 1

## 2017-04-23 MED ORDER — SENNOSIDES-DOCUSATE SODIUM 8.6-50 MG PO TABS
2.0000 | ORAL_TABLET | ORAL | Status: DC
  Administered 2017-04-23: 2 via ORAL
  Filled 2017-04-23 (×2): qty 2

## 2017-04-23 MED ORDER — ZOLPIDEM TARTRATE 5 MG PO TABS: 5.0000 mg | ORAL_TABLET | Freq: Every evening | ORAL | Status: DC | PRN

## 2017-04-23 MED ORDER — SODIUM CHLORIDE 0.9% FLUSH: 3.0000 mL | Freq: Two times a day (BID) | INTRAVENOUS | Status: DC

## 2017-04-23 MED ORDER — FENTANYL CITRATE (PF) 100 MCG/2ML IJ SOLN
100.0000 ug | INTRAMUSCULAR | Status: DC | PRN
  Administered 2017-04-23: 100 ug via INTRAVENOUS
  Filled 2017-04-23: qty 2

## 2017-04-23 MED ORDER — FENTANYL 2.5 MCG/ML BUPIVACAINE 1/10 % EPIDURAL INFUSION (WH - ANES)
INTRAMUSCULAR | Status: AC
  Filled 2017-04-23: qty 100

## 2017-04-23 MED ORDER — LIDOCAINE HCL (PF) 1 % IJ SOLN
INTRAMUSCULAR | Status: DC | PRN
  Administered 2017-04-23: 3 mL via EPIDURAL
  Administered 2017-04-23: 5 mL via EPIDURAL
  Administered 2017-04-23: 2 mL via EPIDURAL

## 2017-04-23 MED ORDER — MEASLES, MUMPS & RUBELLA VAC ~~LOC~~ INJ
0.5000 mL | INJECTION | Freq: Once | SUBCUTANEOUS | Status: DC
  Filled 2017-04-23: qty 0.5

## 2017-04-23 MED ORDER — BENZOCAINE-MENTHOL 20-0.5 % EX AERO: 1.0000 "application " | INHALATION_SPRAY | CUTANEOUS | Status: DC | PRN

## 2017-04-23 MED ORDER — ACETAMINOPHEN 325 MG PO TABS
650.0000 mg | ORAL_TABLET | ORAL | Status: DC | PRN
  Administered 2017-04-23 – 2017-04-25 (×4): 650 mg via ORAL
  Filled 2017-04-23 (×4): qty 2

## 2017-04-23 NOTE — H&P (Signed)
LABOR AND DELIVERY ADMISSION HISTORY AND PHYSICAL NOTE  Sharon Johnston is a 27 y.o. female [phone] with IUP at [redacted]w[redacted]d by LMP presenting for IOL for chronic HTN. BP elevated in MAU warranting admission for induction. Labs drawn for [facility] were negative. No more pressures in the severe range since admission.  She reports positive fetal movement. She denies leakage of fluid or vaginal bleeding. She denies HA, vision changes, RUQ pain, or LE edema.   Prenatal History/Complications: PNC at [place] Pregnancy complications:  - chronic HTN   - depression   Sono: @[redacted]w[redacted]d , CWD, normal anatomy-echogenic focus in LV, cephalic presentation, placenta posterior above cervical os, 263g, 30% EFW  Past Medical History: Past Medical History:  Diagnosis Date  . Chlamydia   . Depression    hospitalized for 2 weeks after suicide att was post partum  . Hx of gonorrhea   . Hypertension    gestational HTN  . Hypertension   . Kidney infection     Past Surgical History: Past Surgical History:  Procedure Laterality Date  . NO PAST SURGERIES      Obstetrical History: OB History    Gravida Para Term Preterm AB Living   6 3 3  0 2 3   SAB TAB Ectopic Multiple Live Births   1 1 0   3      Social History: Social History   Socioeconomic History  . Marital status: Single    Spouse name: None  . Number of children: None  . Years of education: None  . Highest education level: None  Social Needs  . Financial resource strain: None  . Food insecurity - worry: None  . Food insecurity - inability: None  . Transportation needs - medical: None  . Transportation needs - non-medical: None  Occupational History  . None  Tobacco Use  . Smoking status: Former Smoker    Packs/day: 0.25    Types: Cigarettes    Last attempt to quit: 03/21/2017    Years since quitting: 0.0  . Smokeless tobacco: Never Used  Substance and Sexual Activity  . Alcohol use: No    Frequency: Never  . Drug use: No  . Sexual activity:  Yes    Birth control/protection: None  Other Topics Concern  . None  Social History Narrative   ** Merged History Encounter **        Family History: Family History  Problem Relation Age of Onset  . Hypertension Mother   . Hypertension Maternal Grandmother   . Hypertension Maternal Aunt   . Depression Cousin     Allergies: No Known Allergies  No medications prior to admission.     Review of Systems  All systems reviewed and negative except as stated in HPI  Physical Exam Blood pressure (!) 151/99, pulse 89, temperature 98.5 F (36.9 C), temperature source Oral, resp. rate 16, height 5\' 2"  (1.575 m), weight 74.8 kg (165 lb), last menstrual period 07/22/2016, SpO2 100 %. General appearance: alert, oriented, NAD Lungs: normal respiratory effort Heart: regular rate Abdomen: soft, non-tender; gravid, FH appropriate for GA Extremities: No calf swelling or tenderness Presentation: cephalic, confirmed with ultrasound  Fetal monitoring: FHR 140, moderate variability, +accel, +early decel Uterine activity: irregular, every 1-4 min Dilation: Fingertip Effacement (%): 50 Station: Ballotable Exam by:: Leftwich-Kirby, CNM  Prenatal labs: ABO, Rh: --/--/A POS (02/03 0003) Antibody: NEG (02/03 0003) Rubella: 1.82 (09/18 1005) RPR: Non Reactive (11/16 1052)  HBsAg: Negative (09/18 1005)  HIV: Non Reactive (11/16 1052)  GC/Chlamydia: negative 03/30/17 GBS: Negative (01/10 1515)  2-hr GTT: 69/77/68 (normal) Genetic screening:  Late to care Anatomy US: female, echogenic focus in LV  Prenatal Transfer Tool  Maternal Diabetes: No Genetic Screening: late to care Maternal Ultrasounds/Referrals: Normal Fetal Ultrasounds or other Referrals:  None Maternal Substance Abuse:  Yes:  Type: Smoker, 1 cigarette a week  Significant Maternal Medications:  None Significant Maternal Lab Results: Lab values include: Group B Strep negative  Results for orders placed or performed during the  hospital encounter of 04/22/17 (from the past 24 hour(s))  Fern Test   Collection Time: 04/22/17 11:02 PM  Result Value Ref Range   POCT Fern Test Negative = intact amniotic membranes   CBC   Collection Time: 04/23/17 12:03 AM  Result Value Ref Range   WBC 7.2 4.0 - 10.5 K/uL   RBC 3.38 (L) 3.87 - 5.11 MIL/uL   Hemoglobin 8.9 (L) 12.0 - 15.0 g/dL   HCT 16.1 (L) 09.6 - 04.5 %   MCV 82.8 78.0 - 100.0 fL   MCH 26.3 26.0 - 34.0 pg   MCHC 31.8 30.0 - 36.0 g/dL   RDW 40.9 81.1 - 91.4 %   Platelets 220 150 - 400 K/uL  Comprehensive metabolic panel   Collection Time: 04/23/17 12:03 AM  Result Value Ref Range   Sodium 135 135 - 145 mmol/L   Potassium 3.4 (L) 3.5 - 5.1 mmol/L   Chloride 108 101 - 111 mmol/L   CO2 21 (L) 22 - 32 mmol/L   Glucose, Bld 85 65 - 99 mg/dL   BUN 10 6 - 20 mg/dL   Creatinine, Ser 7.82 0.44 - 1.00 mg/dL   Calcium 8.5 (L) 8.9 - 10.3 mg/dL   Total Protein 6.3 (L) 6.5 - 8.1 g/dL   Albumin 2.9 (L) 3.5 - 5.0 g/dL   AST 16 15 - 41 U/L   ALT 10 (L) 14 - 54 U/L   Alkaline Phosphatase 144 (H) 38 - 126 U/L   Total Bilirubin 0.7 0.3 - 1.2 mg/dL   GFR calc non Af Amer >60 >60 mL/min   GFR calc Af Amer >60 >60 mL/min   Anion gap 6 5 - 15  Type and screen   Collection Time: 04/23/17 12:03 AM  Result Value Ref Range   ABO/RH(D) A POS    Antibody Screen NEG    Sample Expiration      04/26/2017 Performed at [facility], [address] [place], [place] [address]     Patient Active Problem List   Diagnosis Date Noted  . Postcoital bleeding 03/13/2017  . Supervision of other normal pregnancy, antepartum 12/06/2016  . Chronic hypertension 10/31/2015  . Chronic hypertension complicating or reason for care during pregnancy 10/31/2015  . Depression 06/15/2013    Assessment: Sharon Johnston is a 27 y.o. [id number] at [redacted]w[redacted]d here for IOL for chronic HTN not on medications.   #Labor: Induction with cytotec. Place FB when able.  #Pain: Per patient request, planning on  epidural  #FWB: Category 1  #ID: GBS [id number]  #MOF: bottle  #MOC: depo, wanted a BTL but unsure if she signed papers  #Circ:  Outpatient   Oralia Manis, DO 04/23/2017, 2:35 AM  OB FELLOW HISTORY AND PHYSICAL ATTESTATION  I confirm that I have verified the information documented in the resident's note and that I have also personally reperformed the physical exam and all medical decision making activities. I agree with above documentation and have made edits as needed.  Caryl AdaJazma Yoneko Talerico, DO

## 2017-04-23 NOTE — Anesthesia Postprocedure Evaluation (Signed)
Anesthesia Post Note  Patient: Sharon Johnston  Procedure(s) Performed: AN AD HOC LABOR EPIDURAL     Patient location during evaluation: Mother Baby Anesthesia Type: Epidural Level of consciousness: awake Pain management: satisfactory to patient Vital Signs Assessment: post-procedure vital signs reviewed and stable Respiratory status: spontaneous breathing Cardiovascular status: stable Anesthetic complications: no    Last Vitals:  Vitals:   04/23/17 1309 04/23/17 1345  BP: (!) 160/83 (!) 154/71  Pulse: (!) 114 (!) 105  Resp: 20 18  Temp:  37.1 C  SpO2:      Last Pain:  Vitals:   04/23/17 1345  TempSrc: Oral  PainSc:    Pain Goal: Patients Stated Pain Goal: 5 (04/22/17 2235)               Cephus ShellingBURGER,[name]

## 2017-04-23 NOTE — Anesthesia Preprocedure Evaluation (Signed)
Anesthesia Evaluation  Patient identified by MRN, date of birth, ID band Patient awake    Reviewed: Allergy & Precautions, NPO status , Patient's Chart, lab work & pertinent test results  Airway Mallampati: II  TM Distance: >3 FB Neck ROM: Full    Dental  (+) Teeth Intact, Dental Advisory Given   Pulmonary former smoker,    Pulmonary exam normal breath sounds clear to auscultation       Cardiovascular hypertension, negative cardio ROS Normal cardiovascular exam Rhythm:Regular Rate:Normal     Neuro/Psych PSYCHIATRIC DISORDERS Depression negative neurological ROS     GI/Hepatic negative GI ROS, Neg liver ROS,   Endo/Other  negative endocrine ROSObesity   Renal/GU negative Renal ROS     Musculoskeletal negative musculoskeletal ROS (+)   Abdominal   Peds  Hematology  (+) Blood dyscrasia, anemia , Plt 220k   Anesthesia Other Findings Day of surgery medications reviewed with the patient.  Reproductive/Obstetrics (+) Pregnancy                             Anesthesia Physical Anesthesia Plan  ASA: III  Anesthesia Plan: Epidural   Post-op Pain Management:    Induction:   PONV Risk Score and Plan: 2 and Treatment may vary due to age or medical condition  Airway Management Planned:   Additional Equipment:   Intra-op Plan:   Post-operative Plan:   Informed Consent: I have reviewed the patients History and Physical, chart, labs and discussed the procedure including the risks, benefits and alternatives for the proposed anesthesia with the patient or authorized representative who has indicated his/her understanding and acceptance.   Dental advisory given  Plan Discussed with:   Anesthesia Plan Comments: (Patient identified. Risks/Benefits/Options discussed with patient including but not limited to bleeding, infection, nerve damage, paralysis, failed block, incomplete pain control,  headache, blood pressure changes, nausea, vomiting, reactions to medication both or allergic, itching and postpartum back pain. Confirmed with bedside nurse the patient's most recent platelet count. Confirmed with patient that they are not currently taking any anticoagulation, have any bleeding history or any family history of bleeding disorders. Patient expressed understanding and wished to proceed. All questions were answered. )        Anesthesia Quick Evaluation

## 2017-04-23 NOTE — Progress Notes (Signed)
Sharon Johnston is a 27 y.o. [id number] at 9310w2d by LMP admitted for induction of labor due to Hypertension.  Subjective: Doing well. No complaints of RUQ pain, LE edema, vision change, or headache. States she feels much better after epidural.   Objective: BP (!) 143/91   Pulse 81   Temp 98.6 F (37 C) (Oral)   Resp 16   Ht 5\' 2"  (1.575 m)   Wt 74.8 kg (165 lb)   LMP 07/22/2016 (Exact Date)   SpO2 100%   BMI 30.18 kg/m  No intake/output data recorded. No intake/output data recorded.  FHT:  FHR: 130 bpm, variability: moderate,  accelerations:  Present,  decelerations:  Present early UC:   regular, every 2-4 minutes SVE:   Dilation: 8.5 Effacement (%): 90 Station: 0 Exam by:: amwlkaer,rn  Labs: Lab Results  Component Value Date   WBC 7.2 04/23/2017   HGB 8.9 (L) 04/23/2017   HCT 28.0 (L) 04/23/2017   MCV 82.8 04/23/2017   PLT 220 04/23/2017    Assessment / Plan: IOL for chronic HTN  Labor: Progressing normally, anticipate NSVD Preeclampsia:  no signs or symptoms of toxicity and labs stable Fetal Wellbeing:  Category I Pain Control:  Epidural I/D:  n/a Anticipated MOD:  NSVD  Leandro Berkowitz 04/23/2017, 6:13 AM

## 2017-04-23 NOTE — Progress Notes (Signed)
Pt shivering

## 2017-04-23 NOTE — Progress Notes (Signed)
[facility] temp and blood pressure.  L&D nurse calls CNM and gets order for Tylenol and Hydralazine.  Tylenol given by L&D nurse.  Pt does not have IV.  Blood pressure rechecked and had decreased. Sharon Johnston notified and said to hold Hydralazine for now and continue to monitor blood pressure.  CNM advised not to start Hypertension protocol. Will update at needed.

## 2017-04-23 NOTE — Progress Notes (Addendum)
Sharon Johnston called and updated on pt's blood pressures.  New orders received to start Norvasc 5 mg to start tonight and then once daily in the am. CNM said not to give the Hydralazine. Will continue to monitor and notify CNM as needed.

## 2017-04-23 NOTE — Anesthesia Procedure Notes (Signed)
Epidural Patient location during procedure: OB Start time: 04/23/2017 5:35 AM End time: 04/23/2017 5:40 AM  Staffing Anesthesiologist: Cecile Hearingurk, Gilmore List Edward, MD Performed: anesthesiologist   Preanesthetic Checklist Completed: patient identified, pre-op evaluation, timeout performed, IV checked, risks and benefits discussed and monitors and equipment checked  Epidural Patient position: sitting Prep: DuraPrep Patient monitoring: blood pressure and continuous pulse ox Approach: midline Location: L3-L4 Injection technique: LOR air  Needle:  Needle type: Tuohy  Needle gauge: 17 G Needle length: 9 cm Needle insertion depth: 5 cm Catheter size: 19 Gauge Catheter at skin depth: 10 cm Test dose: negative and Other (1% Lidocaine)  Additional Notes Patient identified.  Risk benefits discussed including failed block, incomplete pain control, headache, nerve damage, paralysis, blood pressure changes, nausea, vomiting, reactions to medication both toxic or allergic, and postpartum back pain.  Patient expressed understanding and wished to proceed.  All questions were answered.  Sterile technique used throughout procedure and epidural site dressed with sterile barrier dressing. No paresthesia or other complications noted. The patient did not experience any signs of intravascular injection such as tinnitus or metallic taste in mouth nor signs of intrathecal spread such as rapid motor block. Please see nursing notes for vital signs. Reason for block:procedure for pain

## 2017-04-24 ENCOUNTER — Encounter: Payer: Self-pay | Admitting: Nurse Practitioner

## 2017-04-24 ENCOUNTER — Ambulatory Visit: Payer: Self-pay

## 2017-04-24 LAB — CBC
HCT: 27.1 % — ABNORMAL LOW (ref 36.0–46.0)
HEMOGLOBIN: 8.8 g/dL — AB (ref 12.0–15.0)
MCH: 26.4 pg (ref 26.0–34.0)
MCHC: 32.5 g/dL (ref 30.0–36.0)
MCV: 81.4 fL (ref 78.0–100.0)
Platelets: 190 10*3/uL (ref 150–400)
RBC: 3.33 MIL/uL — AB (ref 3.87–5.11)
RDW: 13.2 % (ref 11.5–15.5)
WBC: 9.1 10*3/uL (ref 4.0–10.5)

## 2017-04-24 MED ORDER — FERROUS SULFATE 325 (65 FE) MG PO TABS
325.0000 mg | ORAL_TABLET | Freq: Two times a day (BID) | ORAL | Status: DC
  Administered 2017-04-24 – 2017-04-25 (×3): 325 mg via ORAL
  Filled 2017-04-24 (×3): qty 1

## 2017-04-24 MED ORDER — PHENOL 1.4 % MT LIQD
1.0000 | OROMUCOSAL | Status: DC | PRN
  Administered 2017-04-24: 1 via OROMUCOSAL
  Filled 2017-04-24: qty 177

## 2017-04-24 MED ORDER — GUAIFENESIN-DM 100-10 MG/5ML PO SYRP
5.0000 mL | ORAL_SOLUTION | ORAL | Status: DC | PRN
  Administered 2017-04-24 – 2017-04-25 (×2): 5 mL via ORAL
  Filled 2017-04-24 (×3): qty 5

## 2017-04-24 MED ORDER — SALINE SPRAY 0.65 % NA SOLN
1.0000 | NASAL | Status: DC | PRN
  Administered 2017-04-24: 1 via NASAL
  Filled 2017-04-24: qty 44
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
## 2017-04-24 NOTE — Progress Notes (Signed)
Post Partum Day 1 SVD with c HTN Subjective: Patient doing fairly well today. Still having a good amount of pain and cramping. She is bottle feeding. Patient was started on Norvasc 10mg  yesterday. Still having elevated BP no HA or blurry vision.   Objective: Blood pressure (!) 150/84, pulse 67, temperature 97.7 F (36.5 C), temperature source Oral, resp. rate 16, height 5\' 2"  (1.575 m), weight 165 lb (74.8 kg), last menstrual period 07/22/2016, SpO2 100 %, unknown if currently breastfeeding.  Physical Exam:  General: alert, cooperative and appears stated age Lochia: appropriate Uterine Fundus: firm Incision: none  DVT Evaluation: No evidence of DVT seen on physical exam. Negative Homan's sign. No cords or calf tenderness.  Recent Labs    04/23/17 1925 04/24/17 0526  HGB 8.8* 8.8*  HCT 27.1* 27.1*    Assessment/Plan: Plan for discharge tomorrow  PPD 1 from SVD has c HTN Started on Norvasc 10mg  yesterday, still having elevated BP Pain with still cramping.  Discharge tomorrow.    LOS: 1 day   Sharon Johnston 04/24/2017, 10:07 AM

## 2017-04-25 MED ORDER — IBUPROFEN 600 MG PO TABS: 600.0000 mg | ORAL_TABLET | Freq: Four times a day (QID) | ORAL | 0 refills | Status: DC

## 2017-04-25 MED ORDER — AMLODIPINE BESYLATE 10 MG PO TABS: 10.0000 mg | ORAL_TABLET | Freq: Every day | ORAL | 1 refills | Status: DC

## 2017-04-25 MED ORDER — FERROUS SULFATE 325 (65 FE) MG PO TABS: 325.0000 mg | ORAL_TABLET | Freq: Two times a day (BID) | ORAL | 0 refills | Status: DC

## 2017-04-25 MED ORDER — PRENATAL MULTIVITAMIN CH: 1.0000 | ORAL_TABLET | Freq: Every day | ORAL | 1 refills | Status: DC

## 2017-04-25 MED ORDER — SENNOSIDES-DOCUSATE SODIUM 8.6-50 MG PO TABS: 2.0000 | ORAL_TABLET | Freq: Every day | ORAL | 1 refills | Status: DC

## 2017-04-25 NOTE — Discharge Instructions (Signed)

## 2017-04-25 NOTE — Discharge Summary (Signed)
OB Discharge Summary     Patient Name: Sharon Johnston DOB: Jan 31, 1991 MRN: [id number]  Date of admission: 04/22/2017 Delivering MD: Laurence Compton   Date of discharge: 04/25/2017  Admitting diagnosis: 39 WKS, WATER BROKE, CTXS Intrauterine pregnancy: [redacted]w[redacted]d     Secondary diagnosis:  Principal Problem:   SVD (spontaneous vaginal delivery) Active Problems:   Chronic hypertension  Additional problems:  - chronic HTN   - depression      Discharge diagnosis: Term Pregnancy Delivered and CHTN                                                                                                Post partum procedures:None  Augmentation: Cytotec  Complications: None  Hospital course:  Induction of Labor With Vaginal Delivery   27 y.o. yo [phone] at [redacted]w[redacted]d was admitted to the hospital 04/22/2017 for induction of labor.  Indication for induction: cHTN.  Patient had an uncomplicated labor course as follows: Membrane Rupture Time/Date: 7:34 AM ,04/23/2017   Intrapartum Procedures: Episiotomy: None [1]                                         Lacerations:  None [1]  Patient had delivery of a Viable infant.  Information for the patient's newborn:  Teya, Otterson [[id number]]  Delivery Method: Vag-Spont   04/23/2017  Details of delivery can be found in separate delivery note.  Patient had a routine postpartum course. Started on Norvasc for cHTN. BPs were stable on medication. Will have postpartum BP check in a week. Patient is discharged home 04/25/17.  Physical exam  Vitals:   04/23/17 2336 04/24/17 0526 04/24/17 1822 04/25/17 0719  BP: (!) 144/87 (!) 150/84 140/84 129/87  Pulse: 62 67 88 74  Resp:  16 18 16   Temp:  97.7 F (36.5 C) 98 F (36.7 C) 99.6 F (37.6 C)  TempSrc:  Oral Oral Oral  SpO2:   100%   Weight:      Height:       General: alert, cooperative and no distress Lochia: appropriate Uterine Fundus: firm Incision: N/A DVT Evaluation: No evidence of DVT seen on  physical exam. Labs: Lab Results  Component Value Date   WBC 9.1 04/24/2017   HGB 8.8 (L) 04/24/2017   HCT 27.1 (L) 04/24/2017   MCV 81.4 04/24/2017   PLT 190 04/24/2017   CMP Latest Ref Rng & Units 04/23/2017  Glucose 65 - 99 mg/dL 79  BUN 6 - 20 mg/dL 7  Creatinine 6.96 - 2.95 mg/dL 2.84  Sodium 132 - 440 mmol/L 137  Potassium 3.5 - 5.1 mmol/L 3.6  Chloride 101 - 111 mmol/L 108  CO2 22 - 32 mmol/L 22  Calcium 8.9 - 10.3 mg/dL 1.0(U)  Total Protein 6.5 - 8.1 g/dL 6.6  Total Bilirubin 0.3 - 1.2 mg/dL 0.9  Alkaline Phos 38 - 126 U/L 136(H)  AST 15 - 41 U/L 24  ALT 14 - 54 U/L 10(L)    Discharge  instruction: per After Visit Summary and "Baby and Me Booklet".  After visit meds:  Allergies as of 04/25/2017   No Known Allergies     Medication List    TAKE these medications   amLODipine 10 MG tablet Commonly known as:  NORVASC Take 1 tablet (10 mg total) by mouth daily.   ferrous sulfate 325 (65 FE) MG tablet Take 1 tablet (325 mg total) by mouth 2 (two) times daily with a meal.   ibuprofen 600 MG tablet Commonly known as:  ADVIL,MOTRIN Take 1 tablet (600 mg total) by mouth every 6 (six) hours.   prenatal multivitamin Tabs tablet Take 1 tablet by mouth daily at 12 noon.   senna-docusate 8.6-50 MG tablet Commonly known as:  Senokot-S Take 2 tablets by mouth at bedtime.       Diet: routine diet  Activity: Advance as tolerated. Pelvic rest for 6 weeks.   Outpatient follow up:4 weeks Follow up Appt: Future Appointments  Date Time Provider Department Center  05/01/2017  1:00 PM [facility] NURSE [facility] None  05/23/2017  1:00 PM Adam PhenixArnold, James G, MD [facility] None   Follow up Visit:No Follow-up on file.  Postpartum contraception: Undecided; options discussed and handout given  Newborn Data: Live born female  Birth Weight: 7 lb 8.3 oz (3410 g) APGAR: 8, 9  Newborn Delivery   Birth date/time:  04/23/2017 08:58:00 Delivery type:  Vaginal, Spontaneous     Baby  Feeding: Bottle Disposition:home with mother   04/25/2017 Caryl AdaJazma Justus Droke, DO

## 2017-04-28 ENCOUNTER — Inpatient Hospital Stay (HOSPITAL_COMMUNITY): Payer: No Typology Code available for payment source

## 2017-05-01 ENCOUNTER — Ambulatory Visit: Payer: Medicaid Other

## 2017-05-01 NOTE — Progress Notes (Signed)
Patient is one week post partum here for BP check.  Blood pressure is great. Normal postpartum depression screen.  She did sign tubal consent forms today.  She has her four week postpartum scheduled.  She will call the office back this week to schedule her son's circumcision.

## 2017-05-23 ENCOUNTER — Ambulatory Visit: Payer: Medicaid Other | Admitting: Obstetrics & Gynecology

## 2017-05-31 ENCOUNTER — Ambulatory Visit: Payer: Self-pay | Admitting: Obstetrics and Gynecology

## 2017-06-01 ENCOUNTER — Telehealth: Payer: Self-pay | Admitting: Pediatrics

## 2017-06-01 MED ORDER — METRONIDAZOLE 500 MG PO TABS: 500.0000 mg | ORAL_TABLET | Freq: Two times a day (BID) | ORAL | 0 refills | Status: DC

## 2017-06-01 NOTE — Telephone Encounter (Signed)
Pt states she came in office yesterday for nurse visit to self swab.  She c/o having BVag.  She states she was told we would send rx for BVag to pharmacy.  It's not there.  I looked in her chart and see an encounter, but no note or lab order.

## 2017-06-01 NOTE — Telephone Encounter (Addendum)
Reviewed chart. Pt scheduled an appt yesterday but she didn't show.

## 2017-06-01 NOTE — Telephone Encounter (Signed)
I called patient back d/t no one knowing details of visit yesterday.  She states she has vaginal discharge w/ foul odor, itching, and irritation.  She states she has hx of BVag and that is what she currently has.  I sent rx in per standing order.

## 2017-06-05 ENCOUNTER — Ambulatory Visit (INDEPENDENT_AMBULATORY_CARE_PROVIDER_SITE_OTHER): Payer: Medicaid Other | Admitting: Obstetrics & Gynecology

## 2017-06-05 ENCOUNTER — Encounter: Payer: Self-pay | Admitting: Obstetrics & Gynecology

## 2017-06-05 DIAGNOSIS — F53 Postpartum depression: Secondary | ICD-10-CM

## 2017-06-05 DIAGNOSIS — O99345 Other mental disorders complicating the puerperium: Principal | ICD-10-CM

## 2017-06-05 DIAGNOSIS — Z1389 Encounter for screening for other disorder: Secondary | ICD-10-CM

## 2017-06-05 NOTE — Patient Instructions (Signed)
Etonogestrel implant What is this medicine? ETONOGESTREL (et oh noe JES trel) is a contraceptive (birth control) device. It is used to prevent pregnancy. It can be used for up to 3 years. This medicine may be used for other purposes; ask your health care provider or pharmacist if you have questions. COMMON BRAND NAME(S): Implanon, Nexplanon What should I tell my health care provider before I take this medicine? They need to know if you have any of these conditions: -abnormal vaginal bleeding -blood vessel disease or blood clots -cancer of the breast, cervix, or liver -depression -diabetes -gallbladder disease -headaches -heart disease or recent heart attack -high blood pressure -high cholesterol -kidney disease -liver disease -renal disease -seizures -tobacco smoker -an unusual or allergic reaction to etonogestrel, other hormones, anesthetics or antiseptics, medicines, foods, dyes, or preservatives -pregnant or trying to get pregnant -breast-feeding How should I use this medicine? This device is inserted just under the skin on the inner side of your upper arm by a health care professional. Talk to your pediatrician regarding the use of this medicine in children. Special care may be needed. Overdosage: If you think you have taken too much of this medicine contact a poison control center or emergency room at once. NOTE: This medicine is only for you. Do not share this medicine with others. What if I miss a dose? This does not apply. What may interact with this medicine? Do not take this medicine with any of the following medications: -amprenavir -bosentan -fosamprenavir This medicine may also interact with the following medications: -barbiturate medicines for inducing sleep or treating seizures -certain medicines for fungal infections like ketoconazole and itraconazole -grapefruit juice -griseofulvin -medicines to treat seizures like carbamazepine, felbamate, oxcarbazepine,  phenytoin, topiramate -modafinil -phenylbutazone -rifampin -rufinamide -some medicines to treat HIV infection like atazanavir, indinavir, lopinavir, nelfinavir, tipranavir, ritonavir -St. John's wort This list may not describe all possible interactions. Give your health care provider a list of all the medicines, herbs, non-prescription drugs, or dietary supplements you use. Also tell them if you smoke, drink alcohol, or use illegal drugs. Some items may interact with your medicine. What should I watch for while using this medicine? This product does not protect you against HIV infection (AIDS) or other sexually transmitted diseases. You should be able to feel the implant by pressing your fingertips over the skin where it was inserted. Contact your doctor if you cannot feel the implant, and use a non-hormonal birth control method (such as condoms) until your doctor confirms that the implant is in place. If you feel that the implant may have broken or become bent while in your arm, contact your healthcare provider. What side effects may I notice from receiving this medicine? Side effects that you should report to your doctor or health care professional as soon as possible: -allergic reactions like skin rash, itching or hives, swelling of the face, lips, or tongue -breast lumps -changes in emotions or moods -depressed mood -heavy or prolonged menstrual bleeding -pain, irritation, swelling, or bruising at the insertion site -scar at site of insertion -signs of infection at the insertion site such as fever, and skin redness, pain or discharge -signs of pregnancy -signs and symptoms of a blood clot such as breathing problems; changes in vision; chest pain; severe, sudden headache; pain, swelling, warmth in the leg; trouble speaking; sudden numbness or weakness of the face, arm or leg -signs and symptoms of liver injury like dark yellow or brown urine; general ill feeling or flu-like symptoms;  light-colored   stools; loss of appetite; nausea; right upper belly pain; unusually weak or tired; yellowing of the eyes or skin -unusual vaginal bleeding, discharge -signs and symptoms of a stroke like changes in vision; confusion; trouble speaking or understanding; severe headaches; sudden numbness or weakness of the face, arm or leg; trouble walking; dizziness; loss of balance or coordination Side effects that usually do not require medical attention (report to your doctor or health care professional if they continue or are bothersome): -acne -back pain -breast pain -changes in weight -dizziness -general ill feeling or flu-like symptoms -headache -irregular menstrual bleeding -nausea -sore throat -vaginal irritation or inflammation This list may not describe all possible side effects. Call your doctor for medical advice about side effects. You may report side effects to FDA at 1-800-FDA-1088. Where should I keep my medicine? This drug is given in a hospital or clinic and will not be stored at home. NOTE: This sheet is a summary. It may not cover all possible information. If you have questions about this medicine, talk to your doctor, pharmacist, or health care provider.  2018 Elsevier/Gold Standard (2015-09-24 11:19:22)  

## 2017-06-05 NOTE — Progress Notes (Signed)
Post Partum Exam  Sharon Johnston is a 27 y.o. [phone] female who presents for a postpartum visit. She is 6 weeks postpartum following a spontaneous vaginal delivery. I have fully reviewed the prenatal and intrapartum course. The delivery was at 3818w2d gestational weeks.  Anesthesia: epidural. Postpartum course has been unremarkable. Baby's course has been unremarkable. Baby is feeding by bottle - Octavia HeirGerber Soothe.. Bleeding no bleeding. Bowel function is normal. Bladder function is normal. Patient is sexually active. Contraception method is none. Postpartum depression screening:EPDS: 20.   The following portions of the patient's history were reviewed and updated as appropriate: allergies, current medications, past family history, past medical history, past social history, past surgical history and problem list. Last pap smear done 11/2016 and was Abnormal- nl cytology positive HR HPV, routine repeat recommended  Review of Systems Pertinent items are noted in HPI.    Objective:  Last menstrual period 07/22/2016, unknown if currently breastfeeding.  General:  no distress           Abdomen: soft, non-tender; bowel sounds normal; no masses,  no organomegaly   Vulva:  not evaluated  Vagina: not evaluated                    Assessment:    6 week postpartum exam. Pap smear not done at today's visit.   Plan:   1. Contraception: abstinence and Nexplanon 2. Abstain for 2 weeks before Nexplanon   3. Follow up in: 2 weeks or as needed.  4. Referral to integrated behavioral health  Adam PhenixArnold, Briahna Pescador G, MD 06/05/2017

## 2017-06-12 NOTE — BH Specialist Note (Deleted)
Integrated Behavioral Health Initial Visit  MRN: [id number] Name: Sharon Johnston  Number of Integrated Behavioral Health Clinician visits:: 1/6 Session Start time: ***  Session End time: *** Total time: {IBH Total Time:21014050}  Type of Service: Integrated Behavioral Health- Individual/Family Interpretor:No. Interpretor Name and Language: n/a   Warm Hand Off Completed.       SUBJECTIVE: Sharon Johnston is a 27 y.o. female accompanied by n/a *** Patient was referred by Dr Debroah LoopArnold for depression postpartum. Patient reports the following symptoms/concerns: *** Duration of problem: ***; Severity of problem: {Mild/Moderate/Severe:20260}  OBJECTIVE: Mood: {BHH MOOD:22306} and Affect: {BHH AFFECT:22307} Risk of harm to self or others: {CHL AMB BH Suicide Current Mental Status:21022748}  LIFE CONTEXT: Family and Social: *** School/Work: *** Self-Care: *** Life Changes: ***  GOALS ADDRESSED: Patient will: 1. Reduce symptoms of: {IBH Symptoms:21014056} 2. Increase knowledge and/or ability of: {IBH Patient Tools:21014057}  3. Demonstrate ability to: {IBH Goals:21014053}  INTERVENTIONS: Interventions utilized: {IBH Interventions:21014054}  Standardized Assessments completed: {IBH Screening Tools:21014051}  ASSESSMENT: Patient currently experiencing ***.   Patient may benefit from ***.  PLAN: 1. Follow up with behavioral health clinician on : *** 2. Behavioral recommendations: *** 3. Referral(s): {IBH Referrals:21014055} 4. "From scale of 1-10, how likely are you to follow plan?": ***  Rae LipsJamie C Katriel Cutsforth, LCSW  Edinburgh Postnatal Depression Scale - 06/05/17 0946      Edinburgh Postnatal Depression Scale:  In the Past 7 Days   I have been able to laugh and see the funny side of things.  1    I have looked forward with enjoyment to things.  1    I have blamed myself unnecessarily when things went wrong.  2    I have been anxious or worried for no good reason.  2    I have  felt scared or panicky for no good reason.  2    Things have been getting on top of me.  2    I have been so unhappy that I have had difficulty sleeping.  3    I have felt sad or miserable.  3    I have been so unhappy that I have been crying.  3    The thought of harming myself has occurred to me.  1    Edinburgh Postnatal Depression Scale Total  20

## 2017-06-13 ENCOUNTER — Institutional Professional Consult (permissible substitution): Payer: Self-pay

## 2017-06-19 ENCOUNTER — Ambulatory Visit: Payer: Self-pay | Admitting: Obstetrics and Gynecology

## 2017-06-21 ENCOUNTER — Ambulatory Visit (INDEPENDENT_AMBULATORY_CARE_PROVIDER_SITE_OTHER): Payer: Medicaid Other | Admitting: Certified Nurse Midwife

## 2017-06-21 ENCOUNTER — Encounter: Payer: Self-pay | Admitting: Certified Nurse Midwife

## 2017-06-21 ENCOUNTER — Encounter: Payer: Self-pay | Admitting: *Deleted

## 2017-06-21 VITALS — BP 151/91 | HR 54 | Ht 62.0 in | Wt 139.2 lb

## 2017-06-21 DIAGNOSIS — Z3049 Encounter for surveillance of other contraceptives: Secondary | ICD-10-CM

## 2017-06-21 DIAGNOSIS — Z3202 Encounter for pregnancy test, result negative: Secondary | ICD-10-CM | POA: Diagnosis not present

## 2017-06-21 DIAGNOSIS — Z30017 Encounter for initial prescription of implantable subdermal contraceptive: Secondary | ICD-10-CM

## 2017-06-21 LAB — POCT URINE PREGNANCY: Preg Test, Ur: NEGATIVE

## 2017-06-21 MED ORDER — ETONOGESTREL 68 MG ~~LOC~~ IMPL
68.0000 mg | DRUG_IMPLANT | Freq: Once | SUBCUTANEOUS | Status: AC
  Administered 2017-06-21: 68 mg via SUBCUTANEOUS

## 2017-06-21 NOTE — Progress Notes (Signed)
Pt presents for Nexplanon insertion. Last IC yesterday with condoms per pt. Used office supply Nexplanon

## 2017-06-22 DIAGNOSIS — Z30017 Encounter for initial prescription of implantable subdermal contraceptive: Secondary | ICD-10-CM | POA: Insufficient documentation

## 2017-06-22 NOTE — Progress Notes (Signed)
Nexplanon Procedure Note   PRE-OP DIAGNOSIS: desired long-term, reversible contraception  POST-OP DIAGNOSIS: Same  PROCEDURE: Nexplanon  placement Performing Provider: Orvilla Cornwallachelle Marilla Boddy CNM   Patient education prior to procedure, explained risk, benefits of Nexplanon, reviewed alternative options. Patient reported understanding. Gave consent to continue with procedure. LMP 06/18/17.  Is currently breast feeding.   PROCEDURE:  Pregnancy Text :  Negative Site (check):      left arm         Sterile Preparation:   Betadinex3 Lot # [id number]  [id number] Expiration Date Dec 08, 2019  Insertion site was selected 8 - 10 cm from medial epicondyle and marked along with guiding site using sterile marker. Procedure area was prepped and draped in a sterile fashion. 1% Lidocaine 1.5 ml given prior to procedure. Nexplanon  was inserted subcutaneously.Needle was removed from the insertion site. Nexplanon capsule was palpated by provider and patient to assure satisfactory placement. And a bandage applied and the arm was wrapped with gauze bandage.     Followup: The patient tolerated the procedure well without complications.  Instructions:  The patient was instructed to remove the dressing in 24 hours and that some bruising is to be expected.  She was advised to use over the counter analgesics as needed for any pain at the site.  She is to keep the area dry for 24 hours and to call if her hand or arm becomes cold, numb, or blue.  Blood pressure rechecked prior to leaving was in normal range.   Orvilla Cornwallachelle Rhen Dossantos CNM

## 2017-06-26 ENCOUNTER — Other Ambulatory Visit (HOSPITAL_COMMUNITY)
Admission: RE | Admit: 2017-06-26 | Discharge: 2017-06-26 | Disposition: A | Discharge status: Home / Self Care | Payer: Medicaid Other | Source: Ambulatory Visit | Attending: Obstetrics and Gynecology | Admitting: Obstetrics and Gynecology

## 2017-06-26 ENCOUNTER — Ambulatory Visit (INDEPENDENT_AMBULATORY_CARE_PROVIDER_SITE_OTHER): Payer: Medicaid Other | Admitting: Obstetrics and Gynecology

## 2017-06-26 ENCOUNTER — Encounter: Payer: Self-pay | Admitting: Obstetrics and Gynecology

## 2017-06-26 VITALS — BP 149/93 | HR 97 | Temp 100.9°F | Wt 141.0 lb

## 2017-06-26 DIAGNOSIS — N730 Acute parametritis and pelvic cellulitis: Secondary | ICD-10-CM

## 2017-06-26 DIAGNOSIS — R102 Pelvic and perineal pain: Secondary | ICD-10-CM | POA: Diagnosis not present

## 2017-06-26 DIAGNOSIS — Z202 Contact with and (suspected) exposure to infections with a predominantly sexual mode of transmission: Secondary | ICD-10-CM | POA: Diagnosis not present

## 2017-06-26 LAB — POCT URINALYSIS DIPSTICK
Bilirubin, UA: NEGATIVE
Glucose, UA: NEGATIVE
NITRITE UA: POSITIVE
PH UA: 6 (ref 5.0–8.0)
Spec Grav, UA: 1.02 (ref 1.010–1.025)
UROBILINOGEN UA: 0.2 U/dL

## 2017-06-26 MED ORDER — CEFTRIAXONE SODIUM 250 MG IJ SOLR
250.0000 mg | Freq: Once | INTRAMUSCULAR | Status: AC
  Administered 2017-06-26: 250 mg via INTRAMUSCULAR

## 2017-06-26 MED ORDER — DOXYCYCLINE HYCLATE 100 MG PO CAPS: 100.0000 mg | ORAL_CAPSULE | Freq: Two times a day (BID) | ORAL | 0 refills | Status: DC

## 2017-06-26 NOTE — Patient Instructions (Signed)
Pelvic Inflammatory Disease °Pelvic inflammatory disease (PID) is an infection in some or all of the female organs. PID can be in the uterus, ovaries, fallopian tubes, or the surrounding tissues that are inside the lower belly area (pelvis). PID can lead to lasting problems if it is not treated. To check for this disease, your doctor may: °· Do a physical exam. °· Do blood tests, urine tests, or a pregnancy test. °· Look at your vaginal discharge. °· Do tests to look inside the pelvis. °· Test you for other infections. ° °Follow these instructions at home: °· Take over-the-counter and prescription medicines only as told by your doctor. °· If you were prescribed an antibiotic medicine, take it as told by your doctor. Do not stop taking it even if you start to feel better. °· Do not have sex until treatment is done or as told by your doctor. °· Tell your sex partner if you have PID. Your partner may need to be treated. °· Keep all follow-up visits as told by your doctor. This is important. °· Your doctor may test you for infection again 3 months after you are treated. °Contact a doctor if: °· You have more fluid (discharge) coming from your vagina or fluid that is not normal. °· Your pain does not improve. °· You throw up (vomit). °· You have a fever. °· You cannot take your medicines. °· Your partner has a sexually transmitted disease (STD). °· You have pain when you pee (urinate). °Get help right away if: °· You have more belly (abdominal) or lower belly pain. °· You have chills. °· You are not better after 72 hours. °This information is not intended to replace advice given to you by your health care provider. Make sure you discuss any questions you have with your health care provider. °Document Released: 06/03/2008 Document Revised: 08/13/2015 Document Reviewed: 04/14/2014 °Elsevier Interactive Patient Education © 2018 Elsevier Inc. ° °

## 2017-06-26 NOTE — Progress Notes (Signed)
27 yo [id number] here for STI screen. Patient reports onset of pelvic pain for the past few days. The pain is Malcomb Gangemi and located in her lower abdomen. She admits to fever and chills, nausea and emesis. She reports STI exposure but is not certain of which one. She also reports some dysuria and a yellow vaginal discharge with odor. She declines HIV, syphilis and hepatitis testing.  Past Medical History:  Diagnosis Date  . Chlamydia   . Depression    hospitalized for 2 weeks after suicide att was post partum  . Hx of gonorrhea   . Hypertension    gestational HTN  . Hypertension   . Kidney infection    Past Surgical History:  Procedure Laterality Date  . NO PAST SURGERIES     Family History  Problem Relation Age of Onset  . Hypertension Mother   . Hypertension Maternal Grandmother   . Hypertension Maternal Aunt   . Depression Cousin    Social History   Tobacco Use  . Smoking status: Former Smoker    Packs/day: 0.25    Types: Cigarettes    Last attempt to quit: 03/21/2017    Years since quitting: 0.2  . Smokeless tobacco: Never Used  Substance Use Topics  . Alcohol use: No    Frequency: Never  . Drug use: No   ROS See pertinent in HPI  Blood pressure (!) 149/93, pulse 97, temperature (!) 100.9 F (38.3 C), temperature source Oral, weight 141 lb (64 kg), last menstrual period 06/18/2017, not currently breastfeeding. GENERAL: Well-developed, well-nourished female in no acute distress.  ABDOMEN: Soft, nondistended, + lower abdominal tenderness, no rebound, no guarding PELVIC: Normal external female genitalia. Vagina is pink and rugated.  Normal discharge. Normal appearing cervix. Uterus is normal in size. + CMT. No adnexal masses or fullness EXTREMITIES: No cyanosis, clubbing, or edema, 2+ distal pulses.  A/P 27 yo [id number] with clinical findings suspicious for PID - Vaginal cultures collected - Urine culture collected - patient to receive ceftriaxone in office and complete a  14-day course of doxycycline - Patient advised to present to [facility] if emesis prevents her from tolerating po antibiotics - Patient will be contacted with results - Patient declined full STI screen - RTC prn

## 2017-06-26 NOTE — Progress Notes (Signed)
RGYN pt c/o: fever, chills, and pelvic pain possible UTI Per pt recent STD exposure.

## 2017-06-27 LAB — CERVICOVAGINAL ANCILLARY ONLY
BACTERIAL VAGINITIS: POSITIVE — AB
CANDIDA VAGINITIS: NEGATIVE
Chlamydia: POSITIVE — AB
Neisseria Gonorrhea: NEGATIVE
Trichomonas: NEGATIVE

## 2017-06-27 MED ORDER — METRONIDAZOLE 500 MG PO TABS: 500.0000 mg | ORAL_TABLET | Freq: Two times a day (BID) | ORAL | 0 refills | Status: DC

## 2017-06-27 NOTE — Addendum Note (Signed)
Addended by: Catalina AntiguaONSTANT, Jemery Stacey on: 06/27/2017 03:33 PM   Modules accepted: Orders

## 2017-06-28 LAB — URINE CULTURE

## 2017-06-28 NOTE — Progress Notes (Signed)
[facility] notified via fax.

## 2017-07-26 ENCOUNTER — Ambulatory Visit (INDEPENDENT_AMBULATORY_CARE_PROVIDER_SITE_OTHER): Payer: Medicaid Other | Admitting: Obstetrics & Gynecology

## 2017-07-26 ENCOUNTER — Encounter: Payer: Self-pay | Admitting: Obstetrics & Gynecology

## 2017-07-26 VITALS — BP 144/88 | HR 66 | Ht 62.0 in | Wt 134.8 lb

## 2017-07-26 DIAGNOSIS — Z3042 Encounter for surveillance of injectable contraceptive: Secondary | ICD-10-CM

## 2017-07-26 DIAGNOSIS — Z3046 Encounter for surveillance of implantable subdermal contraceptive: Secondary | ICD-10-CM

## 2017-07-26 MED ORDER — MEDROXYPROGESTERONE ACETATE 150 MG/ML IM SUSP: 150.0000 mg | INTRAMUSCULAR | 0 refills | Status: DC

## 2017-07-26 NOTE — Progress Notes (Signed)
Patient ID: Sharon Johnston, female   DOB: August 25, 1990, 27 y.o.   MRN: [id number]  GYNECOLOGY OFFICE PROCEDURE NOTE  Sharon Johnston is a 27 y.o. [id number] here for Nexplanon removal.  Last pap smear was on 11/2016 and was normal.  She requests removal of Nexplanon due to bleeding and headaches.   Nexplanon Removal Patient identified, informed consent performed, consent signed.   Appropriate time out taken. Nexplanon site identified.  Area prepped in usual sterile fashon. One ml of 1% lidocaine was used to anesthetize the area at the distal end of the implant. A small stab incision was made right beside the implant on the distal portion.  The Nexplanon rod was grasped using hemostats and removed without difficulty.  There was minimal blood loss. There were no complications.  3 ml of 1% lidocaine was injected around the incision for post-procedure analgesia.  Steri-strips were applied over the small incision.  A pressure bandage was applied to reduce any bruising.  The patient tolerated the procedure well and was given post procedure instructions.  Patient is planning to use Depo provera for contraception.     Adam Phenix, MD Attending Obstetrician & Gynecologist, [facility]  07/26/2017

## 2017-07-26 NOTE — Patient Instructions (Signed)

## 2017-08-29 ENCOUNTER — Encounter: Payer: Self-pay | Admitting: *Deleted

## 2018-02-01 ENCOUNTER — Other Ambulatory Visit: Payer: Self-pay

## 2018-02-01 ENCOUNTER — Encounter (HOSPITAL_COMMUNITY): Payer: Self-pay

## 2018-02-01 ENCOUNTER — Emergency Department (HOSPITAL_COMMUNITY)
Admission: EM | Admit: 2018-02-01 | Discharge: 2018-02-01 | Disposition: A | Discharge status: Home / Self Care | Payer: Medicaid Other | Attending: Emergency Medicine | Admitting: Emergency Medicine

## 2018-02-01 DIAGNOSIS — Y939 Activity, unspecified: Secondary | ICD-10-CM | POA: Insufficient documentation

## 2018-02-01 DIAGNOSIS — Z79899 Other long term (current) drug therapy: Secondary | ICD-10-CM | POA: Insufficient documentation

## 2018-02-01 DIAGNOSIS — Y929 Unspecified place or not applicable: Secondary | ICD-10-CM | POA: Insufficient documentation

## 2018-02-01 DIAGNOSIS — Y999 Unspecified external cause status: Secondary | ICD-10-CM | POA: Insufficient documentation

## 2018-02-01 DIAGNOSIS — I10 Essential (primary) hypertension: Secondary | ICD-10-CM | POA: Insufficient documentation

## 2018-02-01 DIAGNOSIS — F17290 Nicotine dependence, other tobacco product, uncomplicated: Secondary | ICD-10-CM | POA: Insufficient documentation

## 2018-02-01 DIAGNOSIS — X58XXXA Exposure to other specified factors, initial encounter: Secondary | ICD-10-CM | POA: Insufficient documentation

## 2018-02-01 DIAGNOSIS — S63637A Sprain of interphalangeal joint of left little finger, initial encounter: Secondary | ICD-10-CM | POA: Insufficient documentation

## 2018-02-01 MED ORDER — NAPROXEN 500 MG PO TABS: 500.0000 mg | ORAL_TABLET | Freq: Two times a day (BID) | ORAL | 0 refills | Status: DC

## 2018-02-01 MED ORDER — NAPROXEN 250 MG PO TABS
500.0000 mg | ORAL_TABLET | Freq: Once | ORAL | Status: AC
  Administered 2018-02-01: 500 mg via ORAL
  Filled 2018-02-01: qty 2

## 2018-02-01 NOTE — Discharge Instructions (Signed)
Your finger sprain will take time to heal, please use splint as provided, take Naprosyn twice daily, you can take Tylenol in addition to this, I also encourage you to ice and elevate the finger.  Follow-up with your primary care doctor if symptoms are not improving after 1 week.

## 2018-02-01 NOTE — ED Notes (Signed)
Declined W/C at D/C and was escorted to lobby by RN. 
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
## 2018-02-17 ENCOUNTER — Encounter (HOSPITAL_BASED_OUTPATIENT_CLINIC_OR_DEPARTMENT_OTHER): Payer: Self-pay | Admitting: Emergency Medicine

## 2018-02-17 ENCOUNTER — Emergency Department (HOSPITAL_BASED_OUTPATIENT_CLINIC_OR_DEPARTMENT_OTHER)
Admission: EM | Admit: 2018-02-17 | Discharge: 2018-02-17 | Disposition: A | Discharge status: Home / Self Care | Payer: Self-pay | Attending: Emergency Medicine | Admitting: Emergency Medicine

## 2018-02-17 ENCOUNTER — Other Ambulatory Visit: Payer: Self-pay

## 2018-02-17 DIAGNOSIS — F17210 Nicotine dependence, cigarettes, uncomplicated: Secondary | ICD-10-CM | POA: Insufficient documentation

## 2018-02-17 DIAGNOSIS — I10 Essential (primary) hypertension: Secondary | ICD-10-CM | POA: Insufficient documentation

## 2018-02-17 DIAGNOSIS — R102 Pelvic and perineal pain: Secondary | ICD-10-CM | POA: Insufficient documentation

## 2018-02-17 DIAGNOSIS — Z79899 Other long term (current) drug therapy: Secondary | ICD-10-CM | POA: Insufficient documentation

## 2018-02-17 LAB — CBC
HCT: 38 % (ref 36.0–46.0)
Hemoglobin: 11.7 g/dL — ABNORMAL LOW (ref 12.0–15.0)
MCH: 26.4 pg (ref 26.0–34.0)
MCHC: 30.8 g/dL (ref 30.0–36.0)
MCV: 85.6 fL (ref 80.0–100.0)
PLATELETS: 300 10*3/uL (ref 150–400)
RBC: 4.44 MIL/uL (ref 3.87–5.11)
RDW: 13.2 % (ref 11.5–15.5)
WBC: 5.7 10*3/uL (ref 4.0–10.5)
nRBC: 0 % (ref 0.0–0.2)

## 2018-02-17 LAB — URINALYSIS, ROUTINE W REFLEX MICROSCOPIC
Bilirubin Urine: NEGATIVE
Glucose, UA: NEGATIVE mg/dL
Ketones, ur: NEGATIVE mg/dL
LEUKOCYTES UA: NEGATIVE
NITRITE: NEGATIVE
PROTEIN: NEGATIVE mg/dL
SPECIFIC GRAVITY, URINE: 1.025 (ref 1.005–1.030)
pH: 6 (ref 5.0–8.0)

## 2018-02-17 LAB — COMPREHENSIVE METABOLIC PANEL
ALBUMIN: 4.1 g/dL (ref 3.5–5.0)
ALK PHOS: 47 U/L (ref 38–126)
ALT: 12 U/L (ref 0–44)
ANION GAP: 6 (ref 5–15)
AST: 17 U/L (ref 15–41)
BILIRUBIN TOTAL: 1 mg/dL (ref 0.3–1.2)
BUN: 20 mg/dL (ref 6–20)
CALCIUM: 9 mg/dL (ref 8.9–10.3)
CO2: 24 mmol/L (ref 22–32)
Chloride: 106 mmol/L (ref 98–111)
Creatinine, Ser: 0.88 mg/dL (ref 0.44–1.00)
GFR calc Af Amer: >60 mL/min (ref >60)
Glucose, Bld: 89 mg/dL (ref 70–99)
Potassium: 3.5 mmol/L (ref 3.5–5.1)
Sodium: 136 mmol/L (ref 135–145)
TOTAL PROTEIN: 7.5 g/dL (ref 6.5–8.1)

## 2018-02-17 LAB — PREGNANCY, URINE: PREG TEST UR: NEGATIVE

## 2018-02-17 LAB — URINALYSIS, MICROSCOPIC (REFLEX)

## 2018-02-17 LAB — LIPASE, BLOOD: Lipase: 36 U/L (ref 11–51)

## 2018-02-17 MED ORDER — METRONIDAZOLE 500 MG PO TABS: 500.0000 mg | ORAL_TABLET | Freq: Two times a day (BID) | ORAL | 0 refills | Status: DC

## 2018-02-17 NOTE — Discharge Instructions (Signed)
Establish care with the women's clinic. Establish primary care to discuss blood pressure medications. Return to the emergency department for new or concerning symptoms.

## 2018-02-17 NOTE — ED Triage Notes (Signed)
Patient states that she is having lower pelvic pain x 2 -3 days with vomiting for the last 2 -3 days  - the patient reports that she is having some white discharge as well
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
## 2018-09-06 ENCOUNTER — Emergency Department (HOSPITAL_COMMUNITY)
Admission: EM | Admit: 2018-09-06 | Discharge: 2018-09-06 | Disposition: A | Discharge status: Home / Self Care | Payer: Self-pay | Attending: Emergency Medicine | Admitting: Emergency Medicine

## 2018-09-06 ENCOUNTER — Other Ambulatory Visit: Payer: Self-pay

## 2018-09-06 ENCOUNTER — Encounter (HOSPITAL_COMMUNITY): Payer: Self-pay | Admitting: Emergency Medicine

## 2018-09-06 DIAGNOSIS — R51 Headache: Secondary | ICD-10-CM | POA: Insufficient documentation

## 2018-09-06 DIAGNOSIS — R519 Headache, unspecified: Secondary | ICD-10-CM

## 2018-09-06 DIAGNOSIS — F17290 Nicotine dependence, other tobacco product, uncomplicated: Secondary | ICD-10-CM | POA: Insufficient documentation

## 2018-09-06 DIAGNOSIS — A599 Trichomoniasis, unspecified: Secondary | ICD-10-CM

## 2018-09-06 DIAGNOSIS — I10 Essential (primary) hypertension: Secondary | ICD-10-CM | POA: Insufficient documentation

## 2018-09-06 DIAGNOSIS — Z79899 Other long term (current) drug therapy: Secondary | ICD-10-CM | POA: Insufficient documentation

## 2018-09-06 LAB — BASIC METABOLIC PANEL
Anion gap: 6 (ref 5–15)
BUN: 19 mg/dL (ref 6–20)
CO2: 25 mmol/L (ref 22–32)
Calcium: 9 mg/dL (ref 8.9–10.3)
Chloride: 105 mmol/L (ref 98–111)
Creatinine, Ser: 0.97 mg/dL (ref 0.44–1.00)
GFR calc Af Amer: >60 mL/min (ref >60)
GFR calc non Af Amer: >60 mL/min (ref >60)
Glucose, Bld: 82 mg/dL (ref 70–99)
Potassium: 3.9 mmol/L (ref 3.5–5.1)
Sodium: 136 mmol/L (ref 135–145)

## 2018-09-06 LAB — URINALYSIS, ROUTINE W REFLEX MICROSCOPIC
Bilirubin Urine: NEGATIVE
Glucose, UA: NEGATIVE mg/dL
Ketones, ur: NEGATIVE mg/dL
Nitrite: NEGATIVE
Protein, ur: NEGATIVE mg/dL
Specific Gravity, Urine: 1.027 (ref 1.005–1.030)
pH: 6 (ref 5.0–8.0)

## 2018-09-06 LAB — CBC WITH DIFFERENTIAL/PLATELET
Abs Immature Granulocytes: 0.01 10*3/uL (ref 0.00–0.07)
Basophils Absolute: 0 10*3/uL (ref 0.0–0.1)
Basophils Relative: 1 %
Eosinophils Absolute: 0.1 10*3/uL (ref 0.0–0.5)
Eosinophils Relative: 3 %
HCT: 36.7 % (ref 36.0–46.0)
Hemoglobin: 11.4 g/dL — ABNORMAL LOW (ref 12.0–15.0)
Immature Granulocytes: 0 %
Lymphocytes Relative: 30 %
Lymphs Abs: 1.1 10*3/uL (ref 0.7–4.0)
MCH: 26.5 pg (ref 26.0–34.0)
MCHC: 31.1 g/dL (ref 30.0–36.0)
MCV: 85.3 fL (ref 80.0–100.0)
Monocytes Absolute: 0.4 10*3/uL (ref 0.1–1.0)
Monocytes Relative: 10 %
Neutro Abs: 2.1 10*3/uL (ref 1.7–7.7)
Neutrophils Relative %: 56 %
Platelets: 328 10*3/uL (ref 150–400)
RBC: 4.3 MIL/uL (ref 3.87–5.11)
RDW: 13.5 % (ref 11.5–15.5)
WBC: 3.7 10*3/uL — ABNORMAL LOW (ref 4.0–10.5)
nRBC: 0 % (ref 0.0–0.2)

## 2018-09-06 LAB — WET PREP, GENITAL
Clue Cells Wet Prep HPF POC: NONE SEEN
Sperm: NONE SEEN
Yeast Wet Prep HPF POC: NONE SEEN

## 2018-09-06 LAB — I-STAT BETA HCG BLOOD, ED (MC, WL, AP ONLY): I-stat hCG, quantitative: <5 m[IU]/mL (ref <5)

## 2018-09-06 LAB — PREGNANCY, URINE: Preg Test, Ur: NEGATIVE

## 2018-09-06 MED ORDER — LIDOCAINE HCL (PF) 1 % IJ SOLN
INTRAMUSCULAR | Status: AC
  Administered 2018-09-06: 5 mL
  Filled 2018-09-06: qty 5

## 2018-09-06 MED ORDER — ONDANSETRON HCL 4 MG/2ML IJ SOLN
4.0000 mg | Freq: Once | INTRAMUSCULAR | Status: AC
  Administered 2018-09-06: 4 mg via INTRAVENOUS
  Filled 2018-09-06: qty 2

## 2018-09-06 MED ORDER — METRONIDAZOLE 500 MG PO TABS
2000.0000 mg | ORAL_TABLET | Freq: Once | ORAL | Status: AC
  Administered 2018-09-06: 2000 mg via ORAL
  Filled 2018-09-06: qty 4

## 2018-09-06 MED ORDER — SODIUM CHLORIDE 0.9 % IV BOLUS
1000.0000 mL | Freq: Once | INTRAVENOUS | Status: AC
  Administered 2018-09-06: 1000 mL via INTRAVENOUS

## 2018-09-06 MED ORDER — CEFTRIAXONE SODIUM 250 MG IJ SOLR
250.0000 mg | Freq: Once | INTRAMUSCULAR | Status: AC
  Administered 2018-09-06: 250 mg via INTRAMUSCULAR
  Filled 2018-09-06: qty 250

## 2018-09-06 MED ORDER — AZITHROMYCIN 250 MG PO TABS
1000.0000 mg | ORAL_TABLET | Freq: Once | ORAL | Status: AC
  Administered 2018-09-06: 1000 mg via ORAL
  Filled 2018-09-06: qty 4

## 2018-09-06 NOTE — ED Provider Notes (Signed)
[place] EMERGENCY DEPARTMENT Provider Note   CSN: [id number] Arrival date & time: 09/06/18  1958    History   Chief Complaint Chief Complaint  Patient presents with  . Urinary Frequency  . Headache    HPI Sharon Johnston is a 28 y.o. female.     Pt presents to the ED today with headache, urinary frequency, and vaginal d/c.  The pt said sx started today.  She is sexually active and does not use protection.  No f/c.  No sob or cough.     Past Medical History:  Diagnosis Date  . Chlamydia   . Depression    hospitalized for 2 weeks after suicide att was post partum  . Hx of gonorrhea   . Hypertension    gestational HTN  . Hypertension   . Kidney infection     Patient Active Problem List   Diagnosis Date Noted  . Insertion of implantable subdermal contraceptive 06/22/2017  . Chronic hypertension 10/31/2015  . Depression 06/15/2013    Past Surgical History:  Procedure Laterality Date  . NO PAST SURGERIES       OB History    Gravida  6   Para  4   Term  4   Preterm  0   AB  2   Living  4     SAB  1   TAB  1   Ectopic  0   Multiple  0   Live Births  4            Home Medications    Prior to Admission medications   Medication Sig Start Date End Date Taking? Authorizing Provider  amLODipine (NORVASC) 10 MG tablet Take 1 tablet (10 mg total) by mouth daily. 04/25/17   Katheren Shams, DO  doxycycline (VIBRAMYCIN) 100 MG capsule Take 1 capsule (100 mg total) by mouth 2 (two) times daily. 06/26/17   Constant, Peggy, MD  ferrous sulfate 325 (65 FE) MG tablet Take 1 tablet (325 mg total) by mouth 2 (two) times daily with a meal. 04/25/17   Katheren Shams, DO  ibuprofen (ADVIL,MOTRIN) 600 MG tablet Take 1 tablet (600 mg total) by mouth every 6 (six) hours. Patient not taking: Reported on 06/05/2017 04/25/17   Katheren Shams, DO  medroxyPROGESTERone (DEPO-PROVERA) 150 MG/ML injection Inject 1 mL (150 mg total) into the muscle every  3 (three) months. 07/26/17   Woodroe Mode, MD  metroNIDAZOLE (FLAGYL) 500 MG tablet Take 1 tablet (500 mg total) by mouth 2 (two) times daily. 02/17/18   Robinson, [place] N, PA-C  naproxen (NAPROSYN) 500 MG tablet Take 1 tablet (500 mg total) by mouth 2 (two) times daily. 02/01/18   Jacqlyn Larsen, PA-C  Prenatal Vit-Fe Fumarate-FA (PRENATAL MULTIVITAMIN) TABS tablet Take 1 tablet by mouth daily at 12 noon. Patient not taking: Reported on 06/05/2017 04/25/17   Katheren Shams, DO  senna-docusate (SENOKOT-S) 8.6-50 MG tablet Take 2 tablets by mouth at bedtime. Patient not taking: Reported on 06/05/2017 04/25/17   Katheren Shams, DO    Family History Family History  Problem Relation Age of Onset  . Hypertension Mother   . Hypertension Maternal Grandmother   . Hypertension Maternal Aunt   . Depression Cousin     Social History Social History   Tobacco Use  . Smoking status: Current Some Day Smoker    Packs/day: 0.25    Types: Cigars  . Smokeless tobacco: Never Used  Substance Use Topics  . Alcohol use: No    Frequency: Never  . Drug use: No     Allergies   Patient has no known allergies.   Review of Systems Review of Systems  Genitourinary: Positive for frequency.  Neurological: Positive for headaches.  All other systems reviewed and are negative.    Physical Exam Updated Vital Signs BP (!) 152/95 (BP Location: Right Arm)   Pulse 82   Temp 99.2 F (37.3 C) (Oral)   Resp 19   Ht 5\' 3"  (1.6 m)   Wt 53.1 kg   SpO2 100%   BMI 20.73 kg/m   Physical Exam Vitals signs and nursing note reviewed. Exam conducted with a chaperone present.  Constitutional:      Appearance: She is well-developed.  HENT:     Head: Normocephalic and atraumatic.     Mouth/Throat:     Mouth: Mucous membranes are moist.     Pharynx: Oropharynx is clear.  Eyes:     Extraocular Movements: Extraocular movements intact.     Pupils: Pupils are equal, round, and reactive to light.  Neck:      Musculoskeletal: Normal range of motion and neck supple.  Cardiovascular:     Rate and Rhythm: Normal rate and regular rhythm.     Heart sounds: Normal heart sounds.  Pulmonary:     Effort: Pulmonary effort is normal.     Breath sounds: Normal breath sounds.  Abdominal:     General: Bowel sounds are normal.     Palpations: Abdomen is soft.  Genitourinary:    Vagina: Normal.     Cervix: Discharge present.     Uterus: Normal.      Adnexa: Right adnexa normal and left adnexa normal.  Musculoskeletal: Normal range of motion.  Skin:    General: Skin is warm and dry.     Capillary Refill: Capillary refill takes less than 2 seconds.  Neurological:     Mental Status: She is alert and oriented to person, place, and time.  Psychiatric:        Mood and Affect: Mood normal.        Speech: Speech normal.        Behavior: Behavior normal.      ED Treatments / Results  Labs (all labs ordered are listed, but only abnormal results are displayed) Labs Reviewed  WET PREP, GENITAL - Abnormal; Notable for the following components:      Result Value   Trich, Wet Prep PRESENT (*)    WBC, Wet Prep HPF POC MODERATE (*)    All other components within normal limits  CBC WITH DIFFERENTIAL/PLATELET - Abnormal; Notable for the following components:   WBC 3.7 (*)    Hemoglobin 11.4 (*)    All other components within normal limits  URINALYSIS, ROUTINE W REFLEX MICROSCOPIC - Abnormal; Notable for the following components:   APPearance HAZY (*)    Hgb urine dipstick SMALL (*)    Leukocytes,Ua TRACE (*)    Bacteria, UA MANY (*)    All other components within normal limits  BASIC METABOLIC PANEL  PREGNANCY, URINE  I-STAT BETA HCG BLOOD, ED ([facility], [facility], [facility] ONLY)  GC/CHLAMYDIA PROBE AMP ([place]) NOT [facility]    EKG None  Radiology No results found.  Procedures Procedures (including critical care time)  Medications Ordered in ED Medications  cefTRIAXone (ROCEPHIN) injection 250 mg (has no  administration in time range)  azithromycin (ZITHROMAX) tablet 1,000 mg (has no  administration in time range)  metroNIDAZOLE (FLAGYL) tablet 2,000 mg (has no administration in time range)  sodium chloride 0.9 % bolus 1,000 mL (1,000 mLs Intravenous New Bag/Given 09/06/18 2035)  ondansetron [facility](ZOFRAN) injection 4 mg (4 mg Intravenous Given 09/06/18 2035)     Initial Impression / Assessment and Plan / ED Course  I have reviewed the triage vital signs and the nursing notes.  Pertinent labs & imaging results that were available during my care of the patient were reviewed by me and considered in my medical decision making (see chart for details).    Headache is better after IVFs.  Pt is + for trich.  She requests treatment for gc and chl.  The pt is stable for d/c.  Return if worse. Sexual partners need to be treated.    Final Clinical Impressions(s) / ED Diagnoses   Final diagnoses:  Trichomoniasis  Acute nonintractable headache, unspecified headache type    ED Discharge Orders    None       Jacalyn LefevreHaviland, Monica Zahler, MD 09/06/18 2227

## 2018-09-06 NOTE — ED Triage Notes (Signed)
Reports headache that started earlier today with no relief from ibuprofen as well as urinary frequency, urgency, and burning.

## 2018-09-07 LAB — GC/CHLAMYDIA PROBE AMP (~~LOC~~) NOT AT ARMC
Chlamydia: NEGATIVE
Neisseria Gonorrhea: NEGATIVE

## 2018-10-06 ENCOUNTER — Emergency Department (HOSPITAL_COMMUNITY): Admission: EM | Admit: 2018-10-06 | Discharge: 2018-10-06 | Discharge status: Absent without leave | Payer: Self-pay

## 2018-10-06 ENCOUNTER — Other Ambulatory Visit: Payer: Self-pay

## 2018-11-01 ENCOUNTER — Encounter (HOSPITAL_COMMUNITY): Payer: Self-pay

## 2018-11-01 ENCOUNTER — Other Ambulatory Visit: Payer: Self-pay

## 2018-11-01 ENCOUNTER — Ambulatory Visit (HOSPITAL_COMMUNITY)
Admission: EM | Admit: 2018-11-01 | Discharge: 2018-11-01 | Disposition: A | Discharge status: Home / Self Care | Payer: Self-pay | Attending: Family Medicine | Admitting: Family Medicine

## 2018-11-01 DIAGNOSIS — I10 Essential (primary) hypertension: Secondary | ICD-10-CM

## 2018-11-01 DIAGNOSIS — Z760 Encounter for issue of repeat prescription: Secondary | ICD-10-CM

## 2018-11-01 DIAGNOSIS — N390 Urinary tract infection, site not specified: Secondary | ICD-10-CM

## 2018-11-01 DIAGNOSIS — N898 Other specified noninflammatory disorders of vagina: Secondary | ICD-10-CM

## 2018-11-01 DIAGNOSIS — Z202 Contact with and (suspected) exposure to infections with a predominantly sexual mode of transmission: Secondary | ICD-10-CM

## 2018-11-01 LAB — POCT URINALYSIS DIP (DEVICE)
Bilirubin Urine: NEGATIVE
Glucose, UA: NEGATIVE mg/dL
Ketones, ur: NEGATIVE mg/dL
Nitrite: POSITIVE — AB
Protein, ur: NEGATIVE mg/dL
Specific Gravity, Urine: >=1.03 (ref 1.005–1.030)
Urobilinogen, UA: 0.2 mg/dL (ref 0.0–1.0)
pH: 6 (ref 5.0–8.0)

## 2018-11-01 MED ORDER — AMLODIPINE BESYLATE 10 MG PO TABS: 10.0000 mg | ORAL_TABLET | Freq: Every day | ORAL | 1 refills | Status: DC

## 2018-11-01 MED ORDER — METRONIDAZOLE 500 MG PO TABS: 500.0000 mg | ORAL_TABLET | Freq: Two times a day (BID) | ORAL | 0 refills | Status: DC

## 2018-11-01 MED ORDER — NITROFURANTOIN MONOHYD MACRO 100 MG PO CAPS: 100.0000 mg | ORAL_CAPSULE | Freq: Two times a day (BID) | ORAL | 0 refills | Status: DC

## 2018-11-01 MED ORDER — FLUCONAZOLE 150 MG PO TABS: 150.0000 mg | ORAL_TABLET | Freq: Every day | ORAL | 0 refills | Status: DC

## 2018-11-01 NOTE — ED Triage Notes (Signed)
Pt states she has lower abdominal pain this started this morning. Pt states she needs her B/P medicine refilled.

## 2018-11-01 NOTE — Discharge Instructions (Addendum)
I have refilled your blood pressure medicine.  According to the medical record, you previously took amlodipine 10 mg a day.  You need to call for primary care doctor for ongoing refills.  I have given you the number of [place] internal medicine clinic I am treating you for vaginal discharge.  Metronidazole will treat BV, Diflucan will treat yeast You have a bladder infection.  This requires a second antibiotic, macrobid.  Take the antibiotics with food. I am also testing for possible STD exposure.  If any of these tests are positive, you will be called

## 2018-11-01 NOTE — ED Provider Notes (Signed)
[place]    CSN: [id number] Arrival date & time: 11/01/18  1214      History   Chief Complaint Chief Complaint  Patient presents with  . Abdominal Pain    HPI Kelcey JOHANNE MCGLADE is a 28 y.o. female.   HPI  Patient wants her blood pressure medicine refill.  Chart reveals she was on Norvasc 10 mg a day.  She states she has run out.  The importance of establishing with a primary care doctor for ongoing prescriptions is discussed with the patient.  She is given the name and number of a primary care office that is accepting new patients.  She states that she feels well He also states that she thinks she has a bladder infection.  She has a pressure sensation in her bladder.  Urinary frequency.  She has some vaginal discharge.  Mild odor.  Mild itching.  Possible exposure to STD.  No fever chills.  No nausea vomiting.  Past Medical History:  Diagnosis Date  . Chlamydia   . Depression    hospitalized for 2 weeks after suicide att was post partum  . Hx of gonorrhea   . Hypertension    gestational HTN  . Hypertension   . Kidney infection     Patient Active Problem List   Diagnosis Date Noted  . Insertion of implantable subdermal contraceptive 06/22/2017  . Chronic hypertension 10/31/2015  . Depression 06/15/2013    Past Surgical History:  Procedure Laterality Date  . NO PAST SURGERIES      OB History    Gravida  6   Para  4   Term  4   Preterm  0   AB  2   Living  4     SAB  1   TAB  1   Ectopic  0   Multiple  0   Live Births  4            Home Medications    Prior to Admission medications   Medication Sig Start Date End Date Taking? Authorizing Provider  amLODipine (NORVASC) 10 MG tablet Take 1 tablet (10 mg total) by mouth daily. 11/01/18   Raylene Everts, MD  fluconazole (DIFLUCAN) 150 MG tablet Take 1 tablet (150 mg total) by mouth daily. Repeat in 1 week if needed 11/01/18   Raylene Everts, MD  medroxyPROGESTERone  (DEPO-PROVERA) 150 MG/ML injection Inject 1 mL (150 mg total) into the muscle every 3 (three) months. 07/26/17   Woodroe Mode, MD  metroNIDAZOLE (FLAGYL) 500 MG tablet Take 1 tablet (500 mg total) by mouth 2 (two) times daily. 11/01/18   Raylene Everts, MD  nitrofurantoin, macrocrystal-monohydrate, (MACROBID) 100 MG capsule Take 1 capsule (100 mg total) by mouth 2 (two) times daily. 11/01/18   Raylene Everts, MD  ferrous sulfate 325 (65 FE) MG tablet Take 1 tablet (325 mg total) by mouth 2 (two) times daily with a meal. 04/25/17 11/01/18  Katheren Shams, DO    Family History Family History  Problem Relation Age of Onset  . Hypertension Mother   . Hypertension Maternal Grandmother   . Hypertension Maternal Aunt   . Depression Cousin     Social History Social History   Tobacco Use  . Smoking status: Current Some Day Smoker    Packs/day: 0.25    Types: Cigars  . Smokeless tobacco: Never Used  Substance Use Topics  . Alcohol use: No    Frequency: Never  .  Drug use: No     Allergies   Patient has no known allergies.   Review of Systems Review of Systems  Constitutional: Negative for chills and fever.  HENT: Negative for ear pain and sore throat.   Eyes: Negative for pain and visual disturbance.  Respiratory: Negative for cough and shortness of breath.   Cardiovascular: Negative for chest pain and palpitations.  Gastrointestinal: Negative for abdominal pain and vomiting.  Genitourinary: Positive for dysuria, frequency and vaginal discharge. Negative for hematuria.  Musculoskeletal: Negative for arthralgias and back pain.  Skin: Negative for color change and rash.  Neurological: Negative for seizures and syncope.  All other systems reviewed and are negative.    Physical Exam Triage Vital Signs ED Triage Vitals  Enc Vitals Group     BP 11/01/18 1239 (!) 153/98     Pulse Rate 11/01/18 1239 66     Resp 11/01/18 1239 16     Temp 11/01/18 1239 98.7 F (37.1 C)      Temp src --      SpO2 11/01/18 1239 100 %     Weight 11/01/18 1238 120 lb (54.4 kg)     Height --      Head Circumference --      Peak Flow --      Pain Score 11/01/18 1237 6     Pain Loc --      Pain Edu? --      Excl. in GC? --    No data found.  Updated Vital Signs BP (!) 153/98 (BP Location: Right Arm)   Pulse 66   Temp 98.7 F (37.1 C)   Resp 16   Wt 54.4 kg   LMP 10/30/2018   SpO2 100%   BMI 21.26 kg/m      Physical Exam Constitutional:      General: She is not in acute distress.    Appearance: She is well-developed.  HENT:     Head: Normocephalic and atraumatic.  Eyes:     Conjunctiva/sclera: Conjunctivae normal.     Pupils: Pupils are equal, round, and reactive to light.  Neck:     Musculoskeletal: Normal range of motion.  Cardiovascular:     Rate and Rhythm: Normal rate and regular rhythm.     Heart sounds: Normal heart sounds.  Pulmonary:     Effort: Pulmonary effort is normal. No respiratory distress.     Breath sounds: Normal breath sounds.  Abdominal:     General: Bowel sounds are normal. There is no distension.     Palpations: Abdomen is soft. There is no hepatomegaly or splenomegaly.     Tenderness: There is no abdominal tenderness. There is no right CVA tenderness or left CVA tenderness.  Musculoskeletal: Normal range of motion.  Skin:    General: Skin is warm and dry.  Neurological:     Mental Status: She is alert.  Psychiatric:        Mood and Affect: Mood normal.        Behavior: Behavior normal.      UC Treatments / Results  Labs (all labs ordered are listed, but only abnormal results are displayed) Labs Reviewed  POCT URINALYSIS DIP (DEVICE) - Abnormal; Notable for the following components:      Result Value   Hgb urine dipstick SMALL (*)    Nitrite POSITIVE (*)    Leukocytes,Ua TRACE (*)    All other components within normal limits  URINE CULTURE  CERVICOVAGINAL ANCILLARY ONLY  EKG   Radiology No results found.   Procedures Procedures (including critical care time)  Medications Ordered in UC Medications - No data to display  Initial Impression / Assessment and Plan / UC Course  I have reviewed the triage vital signs and the nursing notes.  Pertinent labs & imaging results that were available during my care of the patient were reviewed by me and considered in my medical decision making (see chart for details).  Clinical Course as of Oct 31 1716  Thu Nov 01, 2018  1344 POCT Urinalysis, Dipstick [YN]    Clinical Course User Index [YN] Eustace MooreNelson, Yvonne Sue, MD    Urinalysis clearly shows a cystitis.  Her symptoms are consistent with bacterial vaginosis.  Number to treat her for vaginitis and tested for STDs. Final Clinical Impressions(s) / UC Diagnoses   Final diagnoses:  Lower urinary tract infectious disease  Essential hypertension  Medicine refill  Vaginal discharge  Possible exposure to STD     Discharge Instructions     I have refilled your blood pressure medicine.  According to the medical record, you previously took amlodipine 10 mg a day.  You need to call for primary care doctor for ongoing refills.  I have given you the number of [place] internal medicine clinic I am treating you for vaginal discharge.  Metronidazole will treat BV, Diflucan will treat yeast You have a bladder infection.  This requires a second antibiotic, macrobid.  Take the antibiotics with food. I am also testing for possible STD exposure.  If any of these tests are positive, you will be called   ED Prescriptions    Medication Sig Dispense Auth. Provider   amLODipine (NORVASC) 10 MG tablet Take 1 tablet (10 mg total) by mouth daily. 30 tablet Eustace MooreNelson, Yvonne Sue, MD   metroNIDAZOLE (FLAGYL) 500 MG tablet Take 1 tablet (500 mg total) by mouth 2 (two) times daily. 14 tablet Eustace MooreNelson, Yvonne Sue, MD   fluconazole (DIFLUCAN) 150 MG tablet Take 1 tablet (150 mg total) by mouth daily. Repeat in 1 week if needed 2  tablet Eustace MooreNelson, Yvonne Sue, MD   nitrofurantoin, macrocrystal-monohydrate, (MACROBID) 100 MG capsule Take 1 capsule (100 mg total) by mouth 2 (two) times daily. 10 capsule Eustace MooreNelson, Yvonne Sue, MD     Controlled Substance Prescriptions [place] Controlled Substance Registry consulted? Not Applicable   Eustace MooreNelson, Yvonne Sue, MD 11/01/18 1721

## 2018-11-03 LAB — CERVICOVAGINAL ANCILLARY ONLY
Chlamydia: NEGATIVE
Neisseria Gonorrhea: NEGATIVE
Trichomonas: NEGATIVE

## 2018-11-03 LAB — URINE CULTURE
Culture: >=100000 — AB
Special Requests: NORMAL

## 2018-12-11 ENCOUNTER — Other Ambulatory Visit: Payer: Self-pay

## 2018-12-11 ENCOUNTER — Other Ambulatory Visit: Payer: Self-pay | Admitting: Student

## 2018-12-11 ENCOUNTER — Encounter (HOSPITAL_COMMUNITY): Payer: Self-pay | Admitting: Emergency Medicine

## 2018-12-11 ENCOUNTER — Emergency Department (HOSPITAL_COMMUNITY)
Admission: EM | Admit: 2018-12-11 | Discharge: 2018-12-11 | Disposition: A | Discharge status: Home / Self Care | Payer: Medicaid - Out of State | Attending: Emergency Medicine | Admitting: Emergency Medicine

## 2018-12-11 ENCOUNTER — Emergency Department (HOSPITAL_COMMUNITY): Payer: Medicaid - Out of State

## 2018-12-11 DIAGNOSIS — F17210 Nicotine dependence, cigarettes, uncomplicated: Secondary | ICD-10-CM | POA: Diagnosis not present

## 2018-12-11 DIAGNOSIS — N739 Female pelvic inflammatory disease, unspecified: Secondary | ICD-10-CM | POA: Insufficient documentation

## 2018-12-11 DIAGNOSIS — N730 Acute parametritis and pelvic cellulitis: Secondary | ICD-10-CM

## 2018-12-11 DIAGNOSIS — R102 Pelvic and perineal pain: Secondary | ICD-10-CM | POA: Diagnosis present

## 2018-12-11 DIAGNOSIS — R10815 Periumbilic abdominal tenderness: Secondary | ICD-10-CM | POA: Diagnosis not present

## 2018-12-11 DIAGNOSIS — Z79899 Other long term (current) drug therapy: Secondary | ICD-10-CM | POA: Diagnosis not present

## 2018-12-11 DIAGNOSIS — I10 Essential (primary) hypertension: Secondary | ICD-10-CM | POA: Diagnosis not present

## 2018-12-11 LAB — URINALYSIS, ROUTINE W REFLEX MICROSCOPIC
Bilirubin Urine: NEGATIVE
Glucose, UA: NEGATIVE mg/dL
Ketones, ur: NEGATIVE mg/dL
Leukocytes,Ua: NEGATIVE
Nitrite: NEGATIVE
Protein, ur: NEGATIVE mg/dL
Specific Gravity, Urine: 1.028 (ref 1.005–1.030)
pH: 5 (ref 5.0–8.0)

## 2018-12-11 LAB — CBC WITH DIFFERENTIAL/PLATELET
Abs Immature Granulocytes: 0.01 10*3/uL (ref 0.00–0.07)
Basophils Absolute: 0 10*3/uL (ref 0.0–0.1)
Basophils Relative: 1 %
Eosinophils Absolute: 0.2 10*3/uL (ref 0.0–0.5)
Eosinophils Relative: 4 %
HCT: 40.3 % (ref 36.0–46.0)
Hemoglobin: 12.6 g/dL (ref 12.0–15.0)
Immature Granulocytes: 0 %
Lymphocytes Relative: 34 %
Lymphs Abs: 1.7 10*3/uL (ref 0.7–4.0)
MCH: 27.9 pg (ref 26.0–34.0)
MCHC: 31.3 g/dL (ref 30.0–36.0)
MCV: 89.4 fL (ref 80.0–100.0)
Monocytes Absolute: 0.5 10*3/uL (ref 0.1–1.0)
Monocytes Relative: 9 %
Neutro Abs: 2.5 10*3/uL (ref 1.7–7.7)
Neutrophils Relative %: 52 %
Platelets: 340 10*3/uL (ref 150–400)
RBC: 4.51 MIL/uL (ref 3.87–5.11)
RDW: 13.4 % (ref 11.5–15.5)
WBC: 4.9 10*3/uL (ref 4.0–10.5)
nRBC: 0 % (ref 0.0–0.2)

## 2018-12-11 LAB — I-STAT BETA HCG BLOOD, ED (MC, WL, AP ONLY): I-stat hCG, quantitative: <5 m[IU]/mL (ref <5)

## 2018-12-11 LAB — COMPREHENSIVE METABOLIC PANEL
ALT: 9 U/L (ref 0–44)
AST: 14 U/L — ABNORMAL LOW (ref 15–41)
Albumin: 4 g/dL (ref 3.5–5.0)
Alkaline Phosphatase: 42 U/L (ref 38–126)
Anion gap: 11 (ref 5–15)
BUN: 19 mg/dL (ref 6–20)
CO2: 24 mmol/L (ref 22–32)
Calcium: 9.1 mg/dL (ref 8.9–10.3)
Chloride: 104 mmol/L (ref 98–111)
Creatinine, Ser: 1.04 mg/dL — ABNORMAL HIGH (ref 0.44–1.00)
GFR calc Af Amer: >60 mL/min (ref >60)
GFR calc non Af Amer: >60 mL/min (ref >60)
Glucose, Bld: 68 mg/dL — ABNORMAL LOW (ref 70–99)
Potassium: 3.6 mmol/L (ref 3.5–5.1)
Sodium: 139 mmol/L (ref 135–145)
Total Bilirubin: 1.1 mg/dL (ref 0.3–1.2)
Total Protein: 7 g/dL (ref 6.5–8.1)

## 2018-12-11 LAB — WET PREP, GENITAL
Sperm: NONE SEEN
Trich, Wet Prep: NONE SEEN
Yeast Wet Prep HPF POC: NONE SEEN

## 2018-12-11 MED ORDER — LIDOCAINE HCL (PF) 1 % IJ SOLN
INTRAMUSCULAR | Status: AC
  Administered 2018-12-11: 0.9 mL
  Filled 2018-12-11: qty 5

## 2018-12-11 MED ORDER — NAPROXEN 500 MG PO TABS: 500.0000 mg | ORAL_TABLET | Freq: Two times a day (BID) | ORAL | 0 refills | Status: DC

## 2018-12-11 MED ORDER — METRONIDAZOLE 500 MG PO TABS: 500.0000 mg | ORAL_TABLET | Freq: Two times a day (BID) | ORAL | 0 refills | Status: DC

## 2018-12-11 MED ORDER — CEFTRIAXONE SODIUM 250 MG IJ SOLR
250.0000 mg | Freq: Once | INTRAMUSCULAR | Status: AC
  Administered 2018-12-11: 15:00:00 250 mg via INTRAMUSCULAR
  Filled 2018-12-11: qty 250

## 2018-12-11 MED ORDER — FENTANYL CITRATE (PF) 100 MCG/2ML IJ SOLN
50.0000 ug | Freq: Once | INTRAMUSCULAR | Status: AC
  Administered 2018-12-11: 12:00:00 50 ug via INTRAVENOUS
  Filled 2018-12-11: qty 2

## 2018-12-11 MED ORDER — ONDANSETRON 4 MG PO TBDP: 4.0000 mg | ORAL_TABLET | Freq: Three times a day (TID) | ORAL | 0 refills | Status: DC | PRN

## 2018-12-11 MED ORDER — ONDANSETRON HCL 4 MG/2ML IJ SOLN
4.0000 mg | Freq: Once | INTRAMUSCULAR | Status: AC
  Administered 2018-12-11: 4 mg via INTRAVENOUS
  Filled 2018-12-11: qty 2

## 2018-12-11 MED ORDER — DOXYCYCLINE HYCLATE 100 MG PO CAPS: 100.0000 mg | ORAL_CAPSULE | Freq: Two times a day (BID) | ORAL | 0 refills | Status: DC

## 2018-12-11 MED ORDER — SODIUM CHLORIDE 0.9 % IV BOLUS
1000.0000 mL | Freq: Once | INTRAVENOUS | Status: AC
  Administered 2018-12-11: 12:00:00 1000 mL via INTRAVENOUS

## 2018-12-11 NOTE — Discharge Instructions (Addendum)
You were seen in the emergency department today for pelvic/abdominal pain.  Your labs were overall reassuring-your kidney function was mildly elevated and your blood sugar was a bit low, blood sugar improved after eating/drinking, there was also some blood in your urine, these should be rechecked by primary care provider within 1 - 2 weeks.  Your ultrasound showed some thickening of your endometrium, please discuss with OB/GYN as you will need a repeat ultrasound within the next 6 to 8 weeks.  You are concerned you may have pelvic inflammatory disease with bacterial vaginosis.  We are sending you home with Flagyl and doxycycline, these are antibiotics, do not drink alcohol when taking Flagyl as it is extremely dangerous.   You are also sending you home with pain and nausea medicine.  - Naproxen is a nonsteroidal anti-inflammatory medication that will help with pain and swelling. Be sure to take this medication as prescribed with food, 1 pill every 12 hours,  It should be taken with food, as it can cause stomach upset, and more seriously, stomach bleeding. Do not take other nonsteroidal anti-inflammatory medications with this such as Advil, Motrin, Aleve, Mobic, Goodie Powder, or Motrin.    -Zofran as a nausea medication he may take every 8 hours as needed for nausea and vomiting.  You make take Tylenol per over the counter dosing with these medications.   We have prescribed you new medication(s) today. Discuss the medications prescribed today with your pharmacist as they can have adverse effects and interactions with your other medicines including over the counter and prescribed medications. Seek medical evaluation if you start to experience new or abnormal symptoms after taking one of these medicines, seek care immediately if you start to experience difficulty breathing, feeling of your throat closing, facial swelling, or rash as these could be indications of a more serious allergic reaction    We  have tested you for gonorrhea, chlamydia, HIV, and syphilis we will call you if any of these results are positive, if positive you will need to inform all sexual partners.  We are covering you for gonorrhea/chlamydia with these antibiotics, if HIV or syphilis are positive you will need to seek care.  Do not have intercourse of any kind until at least 1 week after you have finished antibiotics.  Return to the ER for new or worsening symptoms including but elevated to fever, worsening pain, inability to keep fluids down, dizziness, passing out, or any other concerns.

## 2018-12-11 NOTE — ED Triage Notes (Signed)
Pt in with c/o L lower abd pain and emesis. States LMP 1 mo ago, c/o diarrhea since eysterday

## 2018-12-11 NOTE — ED Notes (Signed)
CBG of 114 .Marland Kitchen not crossing over from glucometer. Checked by this RN.

## 2018-12-11 NOTE — ED Provider Notes (Signed)
[facility] EMERGENCY DEPARTMENT Provider Note   CSN: [id number] Arrival date & time: 12/11/18  0815     History   Chief Complaint Chief Complaint  Patient presents with   Abdominal Pain   Emesis    HPI Sharon Johnston is a 28 y.o. female with a hx of HTN, depression, & prior GC/chlamydia who presents to the ED w/ complaints of abdominal pain that began yesterday.  Patient states the discomfort is in the lower abdomen/pelvic region, it is constant, worse with urination otherwise no alleviating or aggravating factors, no intervention prior to arrival.  She has associated nausea without vomiting.  Also notes white to yellow nonpruritic vaginal discharge as well as urinary frequency.  Denies fever, chills, emesis, hematemesis, melena, hematochezia, dysuria, or vaginal bleeding.  LMP 11/25/2018.  She is currently sexually active with 2 partners w/o protection. No known STD exposures. [id number].     HPI  Past Medical History:  Diagnosis Date   Chlamydia    Depression    hospitalized for 2 weeks after suicide att was post partum   Hx of gonorrhea    Hypertension    gestational HTN   Hypertension    Kidney infection     Patient Active Problem List   Diagnosis Date Noted   Insertion of implantable subdermal contraceptive 06/22/2017   Chronic hypertension 10/31/2015   Depression 06/15/2013    Past Surgical History:  Procedure Laterality Date   NO PAST SURGERIES       OB History    Gravida  6   Para  4   Term  4   Preterm  0   AB  2   Living  4     SAB  1   TAB  1   Ectopic  0   Multiple  0   Live Births  4            Home Medications    Prior to Admission medications   Medication Sig Start Date End Date Taking? Authorizing Provider  amLODipine (NORVASC) 10 MG tablet Take 1 tablet (10 mg total) by mouth daily. 11/01/18   Eustace Moore, MD  fluconazole (DIFLUCAN) 150 MG tablet Take 1 tablet (150 mg total) by mouth  daily. Repeat in 1 week if needed 11/01/18   Eustace Moore, MD  medroxyPROGESTERone (DEPO-PROVERA) 150 MG/ML injection Inject 1 mL (150 mg total) into the muscle every 3 (three) months. 07/26/17   Adam Phenix, MD  metroNIDAZOLE (FLAGYL) 500 MG tablet Take 1 tablet (500 mg total) by mouth 2 (two) times daily. 11/01/18   Eustace Moore, MD  nitrofurantoin, macrocrystal-monohydrate, (MACROBID) 100 MG capsule Take 1 capsule (100 mg total) by mouth 2 (two) times daily. 11/01/18   Eustace Moore, MD  ferrous sulfate 325 (65 FE) MG tablet Take 1 tablet (325 mg total) by mouth 2 (two) times daily with a meal. 04/25/17 11/01/18  Pincus Large, DO    Family History Family History  Problem Relation Age of Onset   Hypertension Mother    Hypertension Maternal Grandmother    Hypertension Maternal Aunt    Depression Cousin     Social History Social History   Tobacco Use   Smoking status: Current Some Day Smoker    Packs/day: 0.25    Types: Cigars   Smokeless tobacco: Never Used  Substance Use Topics   Alcohol use: No    Frequency: Never   Drug use: No  Allergies   Patient has no known allergies.  Review of Systems Review of Systems  Constitutional: Negative for chills and fever.  Respiratory: Negative for shortness of breath.   Cardiovascular: Negative for chest pain.  Gastrointestinal: Positive for abdominal pain and nausea. Negative for anal bleeding, blood in stool, constipation, diarrhea and vomiting.  Genitourinary: Positive for frequency, pelvic pain and vaginal discharge. Negative for dysuria and vaginal bleeding.  Neurological: Negative for syncope.  All other systems reviewed and are negative.   Physical Exam Updated Vital Signs BP 129/84 (BP Location: Left Arm)    Pulse 62    Temp 98.4 F (36.9 C) (Oral)    Resp 18    Wt 53.1 kg    LMP 11/19/2018    SpO2 100%    BMI 20.73 kg/m   Physical Exam Vitals signs and nursing note reviewed. Exam conducted  with a chaperone present.  Constitutional:      General: She is not in acute distress.    Appearance: She is well-developed. She is not toxic-appearing.  HENT:     Head: Normocephalic and atraumatic.  Eyes:     General:        Right eye: No discharge.        Left eye: No discharge.     Conjunctiva/sclera: Conjunctivae normal.  Neck:     Musculoskeletal: Neck supple.  Cardiovascular:     Rate and Rhythm: Normal rate and regular rhythm.  Pulmonary:     Effort: Pulmonary effort is normal. No respiratory distress.     Breath sounds: Normal breath sounds. No wheezing, rhonchi or rales.  Abdominal:     General: There is no distension.     Palpations: Abdomen is soft.     Tenderness: There is abdominal tenderness in the suprapubic area. There is no guarding or rebound. Negative signs include McBurney's sign.  Genitourinary:    Labia:        Right: No lesion.        Left: No lesion.      Vagina: No bleeding.     Cervix: Cervical motion tenderness and discharge (thin white) present. No friability.     Adnexa:        Right: Tenderness present. No mass or fullness.         Left: Tenderness present. No mass or fullness.       Comments: EDT present as chaperone. Diffuse discomfort w/ bimanual exam- most prominent to R adnexa.  Skin:    General: Skin is warm and dry.     Findings: No rash.  Neurological:     Mental Status: She is alert.     Comments: Clear speech.   Psychiatric:        Behavior: Behavior normal.      ED Treatments / Results  Labs (all labs ordered are listed, but only abnormal results are displayed) Labs Reviewed  WET PREP, GENITAL - Abnormal; Notable for the following components:      Result Value   Clue Cells Wet Prep HPF POC PRESENT (*)    WBC, Wet Prep HPF POC MANY (*)    All other components within normal limits  COMPREHENSIVE METABOLIC PANEL - Abnormal; Notable for the following components:   Glucose, Bld 68 (*)    Creatinine, Ser 1.04 (*)    AST 14  (*)    All other components within normal limits  URINALYSIS, ROUTINE W REFLEX MICROSCOPIC - Abnormal; Notable for the following components:   APPearance  HAZY (*)    Hgb urine dipstick MODERATE (*)    Bacteria, UA FEW (*)    All other components within normal limits  CBC WITH DIFFERENTIAL/PLATELET  RPR  HIV ANTIBODY (ROUTINE TESTING W REFLEX)  I-STAT BETA HCG BLOOD, ED ([facility], [facility], [facility] ONLY)  GC/CHLAMYDIA PROBE AMP ([place]) NOT AT [facility]    EKG None  Radiology US Pelvis (transabdominal Only)  Result Date: 12/11/2018 CLINICAL DATA:  Right lower quadrant abdominal pain EXAM: TRANSABDOMINAL AND TRANSVAGINAL ULTRASOUND OF PELVIS DOPPLER ULTRASOUND OF OVARIES TECHNIQUE: Both transabdominal ultrasound examinations of the pelvis were performed. Transabdominal technique was performed for global imaging of the pelvis including uterus, ovaries, adnexal regions, and pelvic cul-de-sac. . Color and duplex Doppler ultrasound was utilized to evaluate blood flow to the ovaries. COMPARISON:  None. FINDINGS: Uterus Measurements: 9.4 x 5.1 x 7.0 = volume: 172 mL. No fibroids or other mass visualized. Endometrium Thickness: Diffusely thickened measuring 19 mm. No focal abnormality visualized. Right ovary Measurements: 2.8 x 3.4 x 3.8 = volume: 19.4 mL. Anechoic follicles are seen with the largest measuring 1.4 x 1.9 x 1.6 cm. Left ovary Measurements: 2.8 x 4.0 x 3.0 = volume: 17.3 mL. Anechoic follicles are seen the largest measuring 1.4 x 1.2 x 1.1 cm Pulsed Doppler evaluation of both ovaries demonstrates normal low-resistance arterial and venous waveforms. Other findings No abnormal free fluid. IMPRESSION: 1. Thickened endometrium measuring 19 mm. Please correlate with the patient's menstrual cycle. Consider follow-up by [place] in 6-8 weeks, during the week immediately following menses (exam timing is critical). 2. Normal appearing ovaries. Electronically Signed   By: Jonna Clark M.D.   On: 12/11/2018 12:32   US  Pelvic Doppler (torsion R/o Or Mass Arterial Flow)  Result Date: 12/11/2018 CLINICAL DATA:  Right lower quadrant abdominal pain EXAM: TRANSABDOMINAL AND TRANSVAGINAL ULTRASOUND OF PELVIS DOPPLER ULTRASOUND OF OVARIES TECHNIQUE: Both transabdominal ultrasound examinations of the pelvis were performed. Transabdominal technique was performed for global imaging of the pelvis including uterus, ovaries, adnexal regions, and pelvic cul-de-sac. . Color and duplex Doppler ultrasound was utilized to evaluate blood flow to the ovaries. COMPARISON:  None. FINDINGS: Uterus Measurements: 9.4 x 5.1 x 7.0 = volume: 172 mL. No fibroids or other mass visualized. Endometrium Thickness: Diffusely thickened measuring 19 mm. No focal abnormality visualized. Right ovary Measurements: 2.8 x 3.4 x 3.8 = volume: 19.4 mL. Anechoic follicles are seen with the largest measuring 1.4 x 1.9 x 1.6 cm. Left ovary Measurements: 2.8 x 4.0 x 3.0 = volume: 17.3 mL. Anechoic follicles are seen the largest measuring 1.4 x 1.2 x 1.1 cm Pulsed Doppler evaluation of both ovaries demonstrates normal low-resistance arterial and venous waveforms. Other findings No abnormal free fluid. IMPRESSION: 1. Thickened endometrium measuring 19 mm. Please correlate with the patient's menstrual cycle. Consider follow-up by [place] in 6-8 weeks, during the week immediately following menses (exam timing is critical). 2. Normal appearing ovaries. Electronically Signed   By: Jonna Clark M.D.   On: 12/11/2018 12:32    Procedures Procedures (including critical care time)  Medications Ordered in ED Medications  cefTRIAXone (ROCEPHIN) injection 250 mg (has no administration in time range)  ondansetron (ZOFRAN) injection 4 mg (4 mg Intravenous Given 12/11/18 1229)  fentaNYL (SUBLIMAZE) injection 50 mcg (50 mcg Intravenous Given 12/11/18 1229)  sodium chloride 0.9 % bolus 1,000 mL (1,000 mLs Intravenous New Bag/Given 12/11/18 1229)     Initial Impression / Assessment and  Plan / ED Course  I have reviewed the  triage vital signs and the nursing notes.  Pertinent labs & imaging results that were available during my care of the patient were reviewed by me and considered in my medical decision making (see chart for details).   Patient presents to the emergency department with complaints of lower abdominal/pelvic pain that began yesterday with associated nausea, urinary frequency, and vaginal discharge.  Patient is nontoxic-appearing, no apparent distress, vitals WNL.  On exam she has suprapubic tenderness as well as diffuse tenderness throughout bimanual exam with no significant area of discomfort being the right adnexa.  No peritoneal signs.  Plan for labs as well as ultrasound.  Pregnancy test: Negative Urinalysis: Hematuria, not consistent with UTI Wet prep: Consistent with bacterial vaginosis, no yeast or trichomoniasis CBC: No leukocytosis/anemia.  CMP: Hypoglycemia @ 68. Creatinine mildly increased from prior.   [place]: 1. Thickened endometrium measuring 19 mm. Please correlate with the patient's menstrual cycle. Consider follow-up by [place] in 6-8 weeks, during the week immediately following menses (exam timing is critical). 2. Normal appearing ovaries.  No torsion or obvious TOA on [place]. Repeat US by GYN outpatient regarding thickened endometrium.  No leukocytosis or point/focal McBurney's point tenderness to suggest acute appendicitis. UA is not overly consistent with UTI. Pregnancy test is negative, doubt ectopic pregnancy.  Primary concern is for PID, she appears appropriate for trial of outpatient treatment, Rocephin given in the ER, doxy/Flagyl prescriptions, naproxen/Zofran for symptomatic management.  Women's health follow-up.  Discussed need to use protection when sexually active and abstinence until least until 1 week following completion of treatment.  We discussed that she has HIV, RPR, gonorrhea, and chlamydia tests pending and is aware that if positive she  will need to inform all sexual partners.  Regarding her mild hyperglycemia at 68, given soda/crackers with improvement to 114, she states she has not had anything to eat today- PCP follow up for this, creatinine, & hematuria. I discussed results, treatment plan, need for follow-up, and return precautions with the patient. Provided opportunity for questions, patient confirmed understanding and is in agreement with plan.      Final Clinical Impressions(s) / ED Diagnoses   Final diagnoses:  PID (acute pelvic inflammatory disease)    ED Discharge Orders         Ordered    doxycycline (VIBRAMYCIN) 100 MG capsule  2 times daily     12/11/18 1436    metroNIDAZOLE (FLAGYL) 500 MG tablet  2 times daily     12/11/18 1436    naproxen (NAPROSYN) 500 MG tablet  2 times daily     12/11/18 1436    ondansetron (ZOFRAN ODT) 4 MG disintegrating tablet  Every 8 hours PRN     12/11/18 1436           Pebbles Zeiders, [place] R, PA-C 12/11/18 1442    Maudie Flakes, MD 12/14/18 1546

## 2018-12-12 LAB — CERVICOVAGINAL ANCILLARY ONLY
Chlamydia: NEGATIVE
Neisseria Gonorrhea: POSITIVE — AB

## 2018-12-12 LAB — RPR: RPR Ser Ql: NONREACTIVE

## 2018-12-12 LAB — HIV ANTIBODY (ROUTINE TESTING W REFLEX): HIV Screen 4th Generation wRfx: NONREACTIVE

## 2018-12-13 LAB — CBG MONITORING, ED: Glucose-Capillary: 114 mg/dL — ABNORMAL HIGH (ref 70–99)

## 2018-12-14 ENCOUNTER — Telehealth (HOSPITAL_COMMUNITY): Payer: Self-pay

## 2018-12-17 ENCOUNTER — Telehealth (HOSPITAL_COMMUNITY): Payer: Self-pay

## 2019-01-22 ENCOUNTER — Other Ambulatory Visit: Payer: Self-pay

## 2019-01-22 ENCOUNTER — Inpatient Hospital Stay (HOSPITAL_COMMUNITY)
Admission: AD | Admit: 2019-01-22 | Discharge: 2019-01-22 | Disposition: A | Discharge status: Home / Self Care | Payer: Medicaid - Out of State | Attending: Family Medicine | Admitting: Family Medicine

## 2019-01-22 DIAGNOSIS — R109 Unspecified abdominal pain: Secondary | ICD-10-CM | POA: Diagnosis present

## 2019-01-22 DIAGNOSIS — Z3202 Encounter for pregnancy test, result negative: Secondary | ICD-10-CM | POA: Diagnosis not present

## 2019-01-22 LAB — POCT PREGNANCY, URINE: Preg Test, Ur: NEGATIVE

## 2019-01-22 NOTE — MAU Note (Signed)
Been cramping, yesterday she was spotting. Thinks she has an STD cause there is an odor, d/c. Doesn't think she is preg.

## 2019-01-22 NOTE — MAU Provider Note (Signed)
First Provider Initiated Contact with Patient 01/22/19 1902     S Ms. Jazia CARLINE DURA is a 28 y.o. [phone] non-pregnant female who presents to MAU today with complaint of abdominal cramping and concern for STD exposure. She endorses LMP of 11/29/2018.   O BP 140/86 (BP Location: Right Arm)   Pulse 74   Temp 98.5 F (36.9 C) (Oral)   Resp 16   Ht 5\' 2"  (1.575 m)   Wt 55.8 kg   LMP 12/22/2018   SpO2 100%   BMI 22.50 kg/m    Physical Exam  Nursing note and vitals reviewed. Constitutional: She is oriented to person, place, and time. She appears well-developed and well-nourished.  Cardiovascular: Normal rate.  Respiratory: Effort normal.  Neurological: She is alert and oriented to person, place, and time.  Skin: Skin is warm and dry.  Psychiatric: She has a normal mood and affect. Her behavior is normal. Judgment and thought content normal.   A Non pregnant female, negative urine test in MAU Medical screening exam complete  P Discharge from MAU in stable condition Patient given the option of transfer to [facility] for further evaluation or seek care in outpatient facility of choice List of options for follow-up given  Warning signs for worsening condition that would warrant emergency follow-up discussed Patient may return to MAU as needed for pregnancy related complaints  Mallie Snooks, CNM 01/22/2019 7:04 PM

## 2019-02-10 ENCOUNTER — Emergency Department (HOSPITAL_COMMUNITY)
Admission: EM | Admit: 2019-02-10 | Discharge: 2019-02-10 | Disposition: A | Discharge status: Home / Self Care | Payer: Medicaid - Out of State | Attending: Emergency Medicine | Admitting: Emergency Medicine

## 2019-02-10 ENCOUNTER — Encounter (HOSPITAL_COMMUNITY): Payer: Self-pay | Admitting: Emergency Medicine

## 2019-02-10 ENCOUNTER — Other Ambulatory Visit: Payer: Self-pay

## 2019-02-10 DIAGNOSIS — N760 Acute vaginitis: Secondary | ICD-10-CM | POA: Diagnosis not present

## 2019-02-10 DIAGNOSIS — N898 Other specified noninflammatory disorders of vagina: Secondary | ICD-10-CM | POA: Diagnosis present

## 2019-02-10 DIAGNOSIS — B349 Viral infection, unspecified: Secondary | ICD-10-CM | POA: Diagnosis not present

## 2019-02-10 DIAGNOSIS — Z202 Contact with and (suspected) exposure to infections with a predominantly sexual mode of transmission: Secondary | ICD-10-CM | POA: Insufficient documentation

## 2019-02-10 DIAGNOSIS — I10 Essential (primary) hypertension: Secondary | ICD-10-CM | POA: Insufficient documentation

## 2019-02-10 DIAGNOSIS — B9689 Other specified bacterial agents as the cause of diseases classified elsewhere: Secondary | ICD-10-CM | POA: Diagnosis not present

## 2019-02-10 DIAGNOSIS — Z20828 Contact with and (suspected) exposure to other viral communicable diseases: Secondary | ICD-10-CM | POA: Insufficient documentation

## 2019-02-10 DIAGNOSIS — F17290 Nicotine dependence, other tobacco product, uncomplicated: Secondary | ICD-10-CM | POA: Diagnosis not present

## 2019-02-10 DIAGNOSIS — Z79899 Other long term (current) drug therapy: Secondary | ICD-10-CM | POA: Insufficient documentation

## 2019-02-10 LAB — URINALYSIS, ROUTINE W REFLEX MICROSCOPIC
Bacteria, UA: NONE SEEN
Bilirubin Urine: NEGATIVE
Glucose, UA: NEGATIVE mg/dL
Ketones, ur: NEGATIVE mg/dL
Nitrite: NEGATIVE
Protein, ur: NEGATIVE mg/dL
Specific Gravity, Urine: 1.02 (ref 1.005–1.030)
pH: 6 (ref 5.0–8.0)

## 2019-02-10 LAB — WET PREP, GENITAL
Sperm: NONE SEEN
Trich, Wet Prep: NONE SEEN
Yeast Wet Prep HPF POC: NONE SEEN

## 2019-02-10 LAB — POC SARS CORONAVIRUS 2 AG -  ED: SARS Coronavirus 2 Ag: NEGATIVE

## 2019-02-10 LAB — PREGNANCY, URINE: Preg Test, Ur: NEGATIVE

## 2019-02-10 MED ORDER — CEFTRIAXONE SODIUM 250 MG IJ SOLR
250.0000 mg | Freq: Once | INTRAMUSCULAR | Status: AC
  Administered 2019-02-10: 250 mg via INTRAMUSCULAR
  Filled 2019-02-10: qty 250

## 2019-02-10 MED ORDER — AZITHROMYCIN 1 G PO PACK
1.0000 g | PACK | Freq: Once | ORAL | Status: AC
  Administered 2019-02-10: 1 g via ORAL
  Filled 2019-02-10: qty 1

## 2019-02-10 MED ORDER — METRONIDAZOLE 500 MG PO TABS: 500.0000 mg | ORAL_TABLET | Freq: Two times a day (BID) | ORAL | 0 refills | Status: DC

## 2019-02-10 NOTE — Discharge Instructions (Signed)
You tested positive for bacterial vaginosis - please pick up medication and take as prescribed. DO NOT DRINK ALCOHOL WHILE ON THIS MEDICATION.   You have been treated for gonorrhea and chlamydia in the ER but the hospital will call you if lab is positive. You were tested for HIV and Syphilis, and the hospital will call you if the lab is positive.   NO SEXUAL INTERCOURSE FOR AT LEAST 10 DAYS AFTER TODAY'S VISIT, THIS WILL INVALIDATE YOUR TREATMENT HERE. DO NOT ENGAGE IN SEXUAL ACTIVITY UNTIL YOU FIND OUT ABOUT YOUR RESULTS AND HAVE PARTNERS TESTED AND TREATED. ALL PARTNERS MUST BE TESTED AND TREATED FOR STD'S. ALWAYS USE CONDOMS WHEN ENGAGING IN INTERCOURSE.   Follow up with [facility] STD clinic for future STD concerns or screenings.  Follow up with your regular doctor in 1 week for recheck of symptoms.   We have also swabbed you for COVID 19 today - stay home and self isolate until you receive your results. If negative you may resume normal daily activity. If positive you need to self isolate for 2 weeks starting today (cleared: 02/25/2019).

## 2019-02-10 NOTE — ED Triage Notes (Signed)
Patient requesting STD screening reports yellowish vaginal discharge this week , patient added intermittent dry cough and chest congestion today , denies fever or chills , respirations unlabored.

## 2019-02-10 NOTE — ED Notes (Signed)
Patient verbalizes understanding of discharge instructions. Opportunity for questioning and answers were provided. Armband removed by staff, pt discharged from ED.  

## 2019-02-10 NOTE — ED Provider Notes (Signed)
[facility] EMERGENCY DEPARTMENT Provider Note   CSN: [id number] Arrival date & time: 02/10/19  1930     History   Chief Complaint Chief Complaint  Patient presents with  . STD screening  . Cough    HPI Sharon Johnston is a 28 y.o. female who presents to the ED today for STD screening.  She reports the last 2 to 3 days she has noticed yellow vaginal discharge.  She is also complaining of some lower abdominal cramping.  Denies pelvic pain.  Reports that a sexual partner of hers tested positive for gonorrhea yesterday.  Patient reports she is sexually active with one female partner and they do not use protection.  Patient does have a history of chlamydia.  Reports her last normal menstrual period was around 11/10.  She is also currently complaining of dysuria for the past 2 to 3 days as well.  She has not been taking anything for her symptoms.  Denies fever, chills, nausea, vomiting, diarrhea, constipation, urinary frequency, pelvic pain, any other associated symptoms.  She also mentions that she has been having a dry cough and chest congestion that started today.  Patient states that she was recently around her cousin who tested positive for COVID-19 1 week ago.  Patient states that she was around her cousin 3 days ago and states that she did not know her cousin was positive until her aunt told her.  Patient denies fever, chills, shortness of breath, chest pain, any other associated symptoms.        Past Medical History:  Diagnosis Date  . Chlamydia   . Depression    hospitalized for 2 weeks after suicide att was post partum  . Hx of gonorrhea   . Hypertension    gestational HTN  . Hypertension   . Kidney infection     Patient Active Problem List   Diagnosis Date Noted  . Insertion of implantable subdermal contraceptive 06/22/2017  . Chronic hypertension 10/31/2015  . Depression 06/15/2013    Past Surgical History:  Procedure Laterality Date  . NO PAST  SURGERIES       OB History    Gravida  6   Para  4   Term  4   Preterm  0   AB  2   Living  4     SAB  1   TAB  1   Ectopic  0   Multiple  0   Live Births  4            Home Medications    Prior to Admission medications   Medication Sig Start Date End Date Taking? Authorizing Provider  amLODipine (NORVASC) 10 MG tablet Take 1 tablet (10 mg total) by mouth daily. 11/01/18   Eustace Moore, MD  doxycycline (VIBRAMYCIN) 100 MG capsule Take 1 capsule (100 mg total) by mouth 2 (two) times daily. 12/11/18   Petrucelli, Samantha R, PA-C  fluconazole (DIFLUCAN) 150 MG tablet Take 1 tablet (150 mg total) by mouth daily. Repeat in 1 week if needed 11/01/18   Eustace Moore, MD  medroxyPROGESTERone (DEPO-PROVERA) 150 MG/ML injection Inject 1 mL (150 mg total) into the muscle every 3 (three) months. 07/26/17   Adam Phenix, MD  metroNIDAZOLE (FLAGYL) 500 MG tablet Take 1 tablet (500 mg total) by mouth 2 (two) times daily. 02/10/19   Hyman Hopes, Yarel Kilcrease, PA-C  naproxen (NAPROSYN) 500 MG tablet Take 1 tablet (500 mg total) by mouth 2 (two)  times daily. 12/11/18   Petrucelli, Pleas KochSamantha R, PA-C  nitrofurantoin, macrocrystal-monohydrate, (MACROBID) 100 MG capsule Take 1 capsule (100 mg total) by mouth 2 (two) times daily. 11/01/18   Eustace MooreNelson, Yvonne Sue, MD  ondansetron (ZOFRAN ODT) 4 MG disintegrating tablet Take 1 tablet (4 mg total) by mouth every 8 (eight) hours as needed for nausea or vomiting. 12/11/18   Petrucelli, Pleas KochSamantha R, PA-C  ferrous sulfate 325 (65 FE) MG tablet Take 1 tablet (325 mg total) by mouth 2 (two) times daily with a meal. 04/25/17 11/01/18  Pincus LargePhelps, Jazma Y, DO    Family History Family History  Problem Relation Age of Onset  . Hypertension Mother   . Hypertension Maternal Grandmother   . Hypertension Maternal Aunt   . Depression Cousin     Social History Social History   Tobacco Use  . Smoking status: Current Some Day Smoker    Packs/day: 0.25    Types:  Cigars  . Smokeless tobacco: Never Used  Substance Use Topics  . Alcohol use: No    Frequency: Never  . Drug use: No     Allergies   Patient has no known allergies.   Review of Systems Review of Systems  Constitutional: Negative for chills and fever.  Respiratory: Positive for cough. Negative for shortness of breath.   Cardiovascular: Negative for chest pain.  Gastrointestinal: Positive for abdominal pain (cramping). Negative for constipation, diarrhea, nausea and vomiting.  Genitourinary: Positive for dysuria and vaginal discharge. Negative for flank pain, menstrual problem, pelvic pain and vaginal bleeding.  Musculoskeletal: Negative for myalgias.     Physical Exam Updated Vital Signs BP 118/83   Pulse 70   Temp 98.4 F (36.9 C) (Oral)   Resp 16   LMP 01/08/2019 (Approximate)   SpO2 100%   Physical Exam Vitals signs and nursing note reviewed.  Constitutional:      Appearance: She is not ill-appearing or diaphoretic.     Comments: Sleeping comfortably as I enter the room  HENT:     Head: Normocephalic and atraumatic.  Eyes:     Conjunctiva/sclera: Conjunctivae normal.  Cardiovascular:     Rate and Rhythm: Normal rate and regular rhythm.     Pulses: Normal pulses.  Pulmonary:     Effort: Pulmonary effort is normal.     Breath sounds: Normal breath sounds. No wheezing, rhonchi or rales.  Abdominal:     General: Abdomen is flat.     Tenderness: There is no abdominal tenderness. There is no right CVA tenderness, left CVA tenderness, guarding or rebound.     Comments: Soft, NTND, +BS throughout, no r/g/r, neg murphy's, neg mcburney's, no CVA TTP  Genitourinary:    Comments: Chaperone present for exam Randa LynnHeath, Mallory G, RN No rashes, lesions, or tenderness to external genitalia. No erythema, injury, or tenderness to vaginal mucosa. Copious amount of thin white vaginal discharge in vault. No adnexal masses, tenderness, or fullness. No CMT, cervical friability, or  discharge from cervical os. Cervical os is closed. Uterus non-deviated, mobile, nonTTP, and without enlargement.  Skin:    General: Skin is warm and dry.     Coloration: Skin is not jaundiced.  Neurological:     Mental Status: She is alert.      ED Treatments / Results  Labs (all labs ordered are listed, but only abnormal results are displayed) Labs Reviewed  WET PREP, GENITAL - Abnormal; Notable for the following components:      Result Value   Clue Cells  Wet Prep HPF POC PRESENT (*)    WBC, Wet Prep HPF POC MODERATE (*)    All other components within normal limits  URINALYSIS, ROUTINE W REFLEX MICROSCOPIC - Abnormal; Notable for the following components:   APPearance HAZY (*)    Hgb urine dipstick MODERATE (*)    Leukocytes,Ua MODERATE (*)    All other components within normal limits  NOVEL CORONAVIRUS, NAA (HOSP ORDER, SEND-OUT TO REF LAB; TAT 18-24 HRS)  PREGNANCY, URINE  RPR  HIV ANTIBODY (ROUTINE TESTING W REFLEX)  POC SARS CORONAVIRUS 2 AG -  ED  GC/CHLAMYDIA PROBE AMP ([place]) NOT AT [facility]    EKG None  Radiology No results found.  Procedures Procedures (including critical care time)  Medications Ordered in ED Medications  cefTRIAXone (ROCEPHIN) injection 250 mg (250 mg Intramuscular Given 02/10/19 2246)  azithromycin (ZITHROMAX) powder 1 g (1 g Oral Given 02/10/19 2246)     Initial Impression / Assessment and Plan / ED Course  I have reviewed the triage vital signs and the nursing notes.  Pertinent labs & imaging results that were available during my care of the patient were reviewed by me and considered in my medical decision making (see chart for details).    28 year old female presents the ED today for STD screening as partner recently told her he tested positive for gonorrhea.  Patient has noticed vaginal discharge described as yellow in color for the past 2 to 3 days with lower abdominal cramping.  Denies any pelvic pain.  She is resting  comfortably in the room as I enter.  Vital signs are stable today.  Patient afebrile without tachycardia or tachypnea.  She has no focal abdominal tenderness on exam -doubt acute abdomen today.  Urinalysis and pregnancy test were obtained prior to being seen.  Patient is not pregnant.  She does have some leuks and 1-20 white blood cells per high-power field.  Will treat for UTI today.  Will need to perform pelvic exam but will empirically treat with Rocephin and Zithromax.  We will also swab for Covid at this time given recent Covid positive exposure with cousin.  If rapid test negative will need to perform send out test.   Wet prep positive for BV. Will treat accordingly. Pt empirically treated for gonorrhea and chlamydia. Her partner has already been tested and treated. Advised to refrain from intercourse for the next 10 days. Advised to not drink any alcohol while on flagyl. Rapid covid test negative - pt advised to self isolate until she receives her covid test results. She is in agreement with plan at this time and stable for discharge home.   This note was prepared using Dragon voice recognition software and may include unintentional dictation errors due to the inherent limitations of voice recognition software.  Sharon Johnston was evaluated in Emergency Department on 02/10/2019 for the symptoms described in the history of present illness. She was evaluated in the context of the global COVID-19 pandemic, which necessitated consideration that the patient might be at risk for infection with the SARS-CoV-2 virus that causes COVID-19. Institutional protocols and algorithms that pertain to the evaluation of patients at risk for COVID-19 are in a state of rapid change based on information released by regulatory bodies including the CDC and federal and state organizations. These policies and algorithms were followed during the patient's care in the ED.       Final Clinical Impressions(s) / ED Diagnoses    Final diagnoses:  Bacterial vaginosis  Vaginal discharge  Exposure to STD  Viral illness    ED Discharge Orders         Ordered    metroNIDAZOLE (FLAGYL) 500 MG tablet  2 times daily     02/10/19 2337           Tanda Rockers, PA-C 02/10/19 2339    Blane Ohara, MD 02/10/19 2342

## 2019-02-11 LAB — HIV ANTIBODY (ROUTINE TESTING W REFLEX): HIV Screen 4th Generation wRfx: NONREACTIVE

## 2019-02-11 LAB — RPR: RPR Ser Ql: NONREACTIVE

## 2019-02-12 LAB — NOVEL CORONAVIRUS, NAA (HOSP ORDER, SEND-OUT TO REF LAB; TAT 18-24 HRS): SARS-CoV-2, NAA: NOT DETECTED

## 2019-02-13 LAB — GC/CHLAMYDIA PROBE AMP (~~LOC~~) NOT AT ARMC
Chlamydia: NEGATIVE
Neisseria Gonorrhea: NEGATIVE

## 2019-03-20 ENCOUNTER — Encounter (HOSPITAL_COMMUNITY): Payer: Self-pay | Admitting: Emergency Medicine

## 2019-03-20 ENCOUNTER — Emergency Department (HOSPITAL_COMMUNITY)
Admission: EM | Admit: 2019-03-20 | Discharge: 2019-03-21 | Disposition: A | Discharge status: Home / Self Care | Payer: Medicaid - Out of State | Attending: Emergency Medicine | Admitting: Emergency Medicine

## 2019-03-20 ENCOUNTER — Other Ambulatory Visit: Payer: Self-pay

## 2019-03-20 DIAGNOSIS — N898 Other specified noninflammatory disorders of vagina: Secondary | ICD-10-CM | POA: Insufficient documentation

## 2019-03-20 DIAGNOSIS — Z79899 Other long term (current) drug therapy: Secondary | ICD-10-CM | POA: Diagnosis not present

## 2019-03-20 DIAGNOSIS — F17210 Nicotine dependence, cigarettes, uncomplicated: Secondary | ICD-10-CM | POA: Diagnosis not present

## 2019-03-20 DIAGNOSIS — I10 Essential (primary) hypertension: Secondary | ICD-10-CM | POA: Insufficient documentation

## 2019-03-20 DIAGNOSIS — Z113 Encounter for screening for infections with a predominantly sexual mode of transmission: Secondary | ICD-10-CM | POA: Insufficient documentation

## 2019-03-20 LAB — COMPREHENSIVE METABOLIC PANEL
ALT: 12 U/L (ref 0–44)
AST: 16 U/L (ref 15–41)
Albumin: 3.9 g/dL (ref 3.5–5.0)
Alkaline Phosphatase: 36 U/L — ABNORMAL LOW (ref 38–126)
Anion gap: 8 (ref 5–15)
BUN: 11 mg/dL (ref 6–20)
CO2: 27 mmol/L (ref 22–32)
Calcium: 9 mg/dL (ref 8.9–10.3)
Chloride: 107 mmol/L (ref 98–111)
Creatinine, Ser: 0.91 mg/dL (ref 0.44–1.00)
GFR calc Af Amer: >60 mL/min (ref >60)
GFR calc non Af Amer: >60 mL/min (ref >60)
Glucose, Bld: 88 mg/dL (ref 70–99)
Potassium: 3.8 mmol/L (ref 3.5–5.1)
Sodium: 142 mmol/L (ref 135–145)
Total Bilirubin: 0.6 mg/dL (ref 0.3–1.2)
Total Protein: 7.1 g/dL (ref 6.5–8.1)

## 2019-03-20 LAB — CBC
HCT: 36.1 % (ref 36.0–46.0)
Hemoglobin: 11 g/dL — ABNORMAL LOW (ref 12.0–15.0)
MCH: 27 pg (ref 26.0–34.0)
MCHC: 30.5 g/dL (ref 30.0–36.0)
MCV: 88.5 fL (ref 80.0–100.0)
Platelets: 304 10*3/uL (ref 150–400)
RBC: 4.08 MIL/uL (ref 3.87–5.11)
RDW: 13.5 % (ref 11.5–15.5)
WBC: 4.4 10*3/uL (ref 4.0–10.5)
nRBC: 0 % (ref 0.0–0.2)

## 2019-03-20 LAB — URINALYSIS, ROUTINE W REFLEX MICROSCOPIC
Bilirubin Urine: NEGATIVE
Glucose, UA: NEGATIVE mg/dL
Ketones, ur: NEGATIVE mg/dL
Nitrite: NEGATIVE
Protein, ur: NEGATIVE mg/dL
Specific Gravity, Urine: 1.018 (ref 1.005–1.030)
pH: 6 (ref 5.0–8.0)

## 2019-03-20 LAB — I-STAT BETA HCG BLOOD, ED (MC, WL, AP ONLY): I-stat hCG, quantitative: <5 m[IU]/mL (ref <5)

## 2019-03-20 LAB — LIPASE, BLOOD: Lipase: 30 U/L (ref 11–51)

## 2019-03-20 MED ORDER — SODIUM CHLORIDE 0.9% FLUSH: 3.0000 mL | Freq: Once | INTRAVENOUS | Status: DC

## 2019-03-20 NOTE — ED Triage Notes (Signed)
Patient reports low abdominal pain onset this week , denies emesis or diarrhea , no fever or chills .

## 2019-03-21 LAB — WET PREP, GENITAL
Sperm: NONE SEEN
Trich, Wet Prep: NONE SEEN
Yeast Wet Prep HPF POC: NONE SEEN

## 2019-03-21 MED ORDER — LIDOCAINE HCL (PF) 1 % IJ SOLN
INTRAMUSCULAR | Status: AC
  Administered 2019-03-21: 5 mL
  Filled 2019-03-21: qty 5

## 2019-03-21 MED ORDER — CEFTRIAXONE SODIUM 250 MG IJ SOLR
250.0000 mg | Freq: Once | INTRAMUSCULAR | Status: AC
  Administered 2019-03-21: 250 mg via INTRAMUSCULAR
  Filled 2019-03-21: qty 250

## 2019-03-21 MED ORDER — AZITHROMYCIN 250 MG PO TABS
1000.0000 mg | ORAL_TABLET | Freq: Once | ORAL | Status: AC
  Administered 2019-03-21: 1000 mg via ORAL
  Filled 2019-03-21: qty 4

## 2019-03-21 MED ORDER — METRONIDAZOLE 500 MG PO TABS: 500.0000 mg | ORAL_TABLET | Freq: Two times a day (BID) | ORAL | 0 refills | Status: DC

## 2019-03-21 NOTE — Discharge Instructions (Signed)
Your swabs today did show some signs of BV.  Take flagyl as directed for treatment of this.  Do not drink alcohol while taking this. Testing for gonorrhea and chlamydia were sent, should come back in the next few days.  You will be contacted if positive, you have already been treated for both today as well. You can follow-up with women's clinic or the health dept for STD screenings.

## 2019-03-21 NOTE — ED Notes (Signed)
Discharge instructions discussed with pt. Pt verbalized understanding. Pt stable and ambulatory. No signature pad available. 

## 2019-03-21 NOTE — ED Provider Notes (Signed)
[facility] EMERGENCY DEPARTMENT Provider Note   CSN: [id number] Arrival date & time: 03/20/19  1904     History Chief Complaint  Patient presents with  . Abdominal Pain    Sharon Johnston is a 28 y.o. female.  The history is provided by the patient and medical records.  Abdominal Pain Associated symptoms: vaginal discharge     28 year old female with history of hypertension and multiple prior episodes of STDs, presenting to the ED with lower abdominal pain.  Reports she had an unprotected sexual encounter during the holidays and is concerned she has another STD.  She reports lower abdominal discomfort, vaginal discharge, odor, and urinary frequency.  Discharge is yellow in color with odor.  She is not having any dysuria or pelvic pain.  She denies any genital lesions, fever, or bodily rash.    Past Medical History:  Diagnosis Date  . Chlamydia   . Depression    hospitalized for 2 weeks after suicide att was post partum  . Hx of gonorrhea   . Hypertension    gestational HTN  . Hypertension   . Kidney infection     Patient Active Problem List   Diagnosis Date Noted  . Insertion of implantable subdermal contraceptive 06/22/2017  . Chronic hypertension 10/31/2015  . Depression 06/15/2013    Past Surgical History:  Procedure Laterality Date  . NO PAST SURGERIES       OB History    Gravida  6   Para  4   Term  4   Preterm  0   AB  2   Living  4     SAB  1   TAB  1   Ectopic  0   Multiple  0   Live Births  4           Family History  Problem Relation Age of Onset  . Hypertension Mother   . Hypertension Maternal Grandmother   . Hypertension Maternal Aunt   . Depression Cousin     Social History   Tobacco Use  . Smoking status: Current Some Day Smoker    Packs/day: 0.25    Types: Cigars  . Smokeless tobacco: Never Used  Substance Use Topics  . Alcohol use: No  . Drug use: No    Home Medications Prior to  Admission medications   Medication Sig Start Date End Date Taking? Authorizing Provider  amLODipine (NORVASC) 10 MG tablet Take 1 tablet (10 mg total) by mouth daily. 11/01/18   Eustace Moore, MD  doxycycline (VIBRAMYCIN) 100 MG capsule Take 1 capsule (100 mg total) by mouth 2 (two) times daily. 12/11/18   Petrucelli, Samantha R, PA-C  fluconazole (DIFLUCAN) 150 MG tablet Take 1 tablet (150 mg total) by mouth daily. Repeat in 1 week if needed 11/01/18   Eustace Moore, MD  medroxyPROGESTERone (DEPO-PROVERA) 150 MG/ML injection Inject 1 mL (150 mg total) into the muscle every 3 (three) months. 07/26/17   Adam Phenix, MD  metroNIDAZOLE (FLAGYL) 500 MG tablet Take 1 tablet (500 mg total) by mouth 2 (two) times daily. 02/10/19   Hyman Hopes, Margaux, PA-C  naproxen (NAPROSYN) 500 MG tablet Take 1 tablet (500 mg total) by mouth 2 (two) times daily. 12/11/18   Petrucelli, Pleas Koch, PA-C  nitrofurantoin, macrocrystal-monohydrate, (MACROBID) 100 MG capsule Take 1 capsule (100 mg total) by mouth 2 (two) times daily. 11/01/18   Eustace Moore, MD  ondansetron (ZOFRAN ODT) 4 MG disintegrating tablet  Take 1 tablet (4 mg total) by mouth every 8 (eight) hours as needed for nausea or vomiting. 12/11/18   Petrucelli, Aldona Bar R, PA-C  ferrous sulfate 325 (65 FE) MG tablet Take 1 tablet (325 mg total) by mouth 2 (two) times daily with a meal. 04/25/17 11/01/18  Katheren Shams, DO    Allergies    Patient has no known allergies.  Review of Systems   Review of Systems  Gastrointestinal: Positive for abdominal pain.  Genitourinary: Positive for vaginal discharge.  All other systems reviewed and are negative.   Physical Exam Updated Vital Signs BP (!) 144/103 (BP Location: Right Arm)   Pulse 64   Temp 98.8 F (37.1 C) (Oral)   Resp 14   LMP 01/30/2019 (Approximate)   SpO2 100%   Physical Exam Vitals and nursing note reviewed.  Constitutional:      Appearance: She is well-developed.     Comments:  Sleeping, awoken for exam  HENT:     Head: Normocephalic and atraumatic.  Eyes:     Conjunctiva/sclera: Conjunctivae normal.     Pupils: Pupils are equal, round, and reactive to light.  Cardiovascular:     Rate and Rhythm: Normal rate and regular rhythm.     Heart sounds: Normal heart sounds.  Pulmonary:     Effort: Pulmonary effort is normal.     Breath sounds: Normal breath sounds.  Abdominal:     General: Bowel sounds are normal.     Palpations: Abdomen is soft.     Tenderness: There is no abdominal tenderness. There is no guarding or rebound.  Genitourinary:    Comments: Exam chaperoned by NT Normal female external genitalia, no visible lesions/rash, moderate amount of thick yellow/green vaginal discharged noted, cervical os closed, no friability noted, no adnexal or CMT Musculoskeletal:        General: Normal range of motion.     Cervical back: Normal range of motion.  Skin:    General: Skin is warm and dry.  Neurological:     Mental Status: She is alert and oriented to person, place, and time.     ED Results / Procedures / Treatments   Labs (all labs ordered are listed, but only abnormal results are displayed) Labs Reviewed  WET PREP, GENITAL - Abnormal; Notable for the following components:      Result Value   Clue Cells Wet Prep HPF POC PRESENT (*)    WBC, Wet Prep HPF POC MODERATE (*)    All other components within normal limits  COMPREHENSIVE METABOLIC PANEL - Abnormal; Notable for the following components:   Alkaline Phosphatase 36 (*)    All other components within normal limits  CBC - Abnormal; Notable for the following components:   Hemoglobin 11.0 (*)    All other components within normal limits  URINALYSIS, ROUTINE W REFLEX MICROSCOPIC - Abnormal; Notable for the following components:   APPearance HAZY (*)    Hgb urine dipstick SMALL (*)    Leukocytes,Ua SMALL (*)    Bacteria, UA RARE (*)    All other components within normal limits  LIPASE, BLOOD   I-STAT BETA HCG BLOOD, ED ([facility], [facility], [facility] ONLY)  GC/CHLAMYDIA PROBE AMP ([place]) NOT AT [facility]    EKG None  Radiology No results found.  Procedures Procedures (including critical care time)  Medications Ordered in ED Medications  sodium chloride flush (NS) 0.9 % injection 3 mL (3 mLs Intravenous Not Given 03/21/19 0452)  cefTRIAXone (ROCEPHIN) injection 250  mg (250 mg Intramuscular Given 03/21/19 0537)  azithromycin (ZITHROMAX) tablet 1,000 mg (1,000 mg Oral Given 03/21/19 0536)  lidocaine (PF) (XYLOCAINE) 1 % injection (5 mLs  Given 03/21/19 0537)    ED Course  I have reviewed the triage vital signs and the nursing notes.  Pertinent labs & imaging results that were available during my care of the patient were reviewed by me and considered in my medical decision making (see chart for details).    MDM Rules/Calculators/A&P  28 year old female here with lower abdominal pain.  She had unprotected sex over the Christmas holiday and is concerned for STD.  She does report some vaginal discharge.  She is afebrile and nontoxic.  She is in no acute distress, sleeping upon entering room and had to be awoken for exam.  Her abdomen is soft and nontender.  Pelvic exam was performed, moderate amount of thick, yellow/green vaginal discharge noted.  Cervical os is closed, no friability.  No adnexal or cervical motion tenderness.  Wet prep with noted clue cells. Gc/chl pending.  Remaining labs are reassuring.  She was treated empirically here with Rocephin and azithromycin, will start on course of Flagyl.  Advised not to drink alcohol while taking this.  She can follow-up with the health department or women's clinic for STD screenings in the future.  She may return here for any new or acute changes.  Final Clinical Impression(s) / ED Diagnoses Final diagnoses:  Vaginal discharge  Screening for STD (sexually transmitted disease)    Rx / DC Orders ED Discharge Orders         Ordered     metroNIDAZOLE (FLAGYL) 500 MG tablet  2 times daily     03/21/19 0550           Garlon HatchetSanders, Christin Moline M, PA-C 03/21/19 0557    Nira Connardama, Pedro Eduardo, MD 03/22/19 [phone]

## 2019-03-25 LAB — GC/CHLAMYDIA PROBE AMP (~~LOC~~) NOT AT ARMC
Chlamydia: NEGATIVE
Neisseria Gonorrhea: NEGATIVE

## 2019-04-15 ENCOUNTER — Emergency Department (HOSPITAL_COMMUNITY)
Admission: EM | Admit: 2019-04-15 | Discharge: 2019-04-16 | Disposition: A | Discharge status: Home / Self Care | Payer: Medicaid - Out of State | Attending: Emergency Medicine | Admitting: Emergency Medicine

## 2019-04-15 ENCOUNTER — Other Ambulatory Visit: Payer: Self-pay

## 2019-04-15 ENCOUNTER — Encounter (HOSPITAL_COMMUNITY): Payer: Self-pay | Admitting: Emergency Medicine

## 2019-04-15 ENCOUNTER — Emergency Department (HOSPITAL_COMMUNITY): Payer: Medicaid - Out of State

## 2019-04-15 DIAGNOSIS — S41132A Puncture wound without foreign body of left upper arm, initial encounter: Secondary | ICD-10-CM | POA: Insufficient documentation

## 2019-04-15 DIAGNOSIS — T1490XA Injury, unspecified, initial encounter: Secondary | ICD-10-CM

## 2019-04-15 DIAGNOSIS — I10 Essential (primary) hypertension: Secondary | ICD-10-CM | POA: Diagnosis not present

## 2019-04-15 DIAGNOSIS — F17290 Nicotine dependence, other tobacco product, uncomplicated: Secondary | ICD-10-CM | POA: Diagnosis not present

## 2019-04-15 DIAGNOSIS — Y929 Unspecified place or not applicable: Secondary | ICD-10-CM | POA: Insufficient documentation

## 2019-04-15 DIAGNOSIS — Z79899 Other long term (current) drug therapy: Secondary | ICD-10-CM | POA: Insufficient documentation

## 2019-04-15 DIAGNOSIS — T07XXXA Unspecified multiple injuries, initial encounter: Secondary | ICD-10-CM

## 2019-04-15 DIAGNOSIS — Z23 Encounter for immunization: Secondary | ICD-10-CM | POA: Insufficient documentation

## 2019-04-15 DIAGNOSIS — Y9389 Activity, other specified: Secondary | ICD-10-CM | POA: Diagnosis not present

## 2019-04-15 DIAGNOSIS — Y999 Unspecified external cause status: Secondary | ICD-10-CM | POA: Insufficient documentation

## 2019-04-15 DIAGNOSIS — S299XXA Unspecified injury of thorax, initial encounter: Secondary | ICD-10-CM | POA: Diagnosis present

## 2019-04-15 DIAGNOSIS — S21139A Puncture wound without foreign body of unspecified front wall of thorax without penetration into thoracic cavity, initial encounter: Secondary | ICD-10-CM | POA: Insufficient documentation

## 2019-04-15 MED ORDER — OXYCODONE-ACETAMINOPHEN 5-325 MG PO TABS
1.0000 | ORAL_TABLET | Freq: Once | ORAL | Status: AC
  Administered 2019-04-15: 1 via ORAL
  Filled 2019-04-15: qty 1

## 2019-04-15 MED ORDER — MORPHINE SULFATE (PF) 4 MG/ML IV SOLN
INTRAVENOUS | Status: AC
  Filled 2019-04-15: qty 1

## 2019-04-15 MED ORDER — TETANUS-DIPHTH-ACELL PERTUSSIS 5-2.5-18.5 LF-MCG/0.5 IM SUSP
0.5000 mL | Freq: Once | INTRAMUSCULAR | Status: AC
  Administered 2019-04-15: 0.5 mL via INTRAMUSCULAR

## 2019-04-15 NOTE — ED Notes (Signed)
Sharon Johnston father [phone] looking for an update on the patient
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
## 2019-04-16 MED ORDER — NAPROXEN 500 MG PO TABS: 500.0000 mg | ORAL_TABLET | Freq: Two times a day (BID) | ORAL | 0 refills | Status: DC
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
## 2019-04-22 ENCOUNTER — Ambulatory Visit (HOSPITAL_COMMUNITY)
Admission: EM | Admit: 2019-04-22 | Discharge: 2019-04-22 | Disposition: A | Discharge status: Home / Self Care | Payer: Self-pay | Attending: Family Medicine | Admitting: Family Medicine

## 2019-04-22 ENCOUNTER — Encounter (HOSPITAL_COMMUNITY): Payer: Self-pay

## 2019-04-22 ENCOUNTER — Other Ambulatory Visit: Payer: Self-pay

## 2019-04-22 DIAGNOSIS — Z202 Contact with and (suspected) exposure to infections with a predominantly sexual mode of transmission: Secondary | ICD-10-CM

## 2019-04-22 DIAGNOSIS — Z3202 Encounter for pregnancy test, result negative: Secondary | ICD-10-CM

## 2019-04-22 DIAGNOSIS — U071 COVID-19: Secondary | ICD-10-CM | POA: Insufficient documentation

## 2019-04-22 DIAGNOSIS — Z20822 Contact with and (suspected) exposure to covid-19: Secondary | ICD-10-CM

## 2019-04-22 DIAGNOSIS — N3001 Acute cystitis with hematuria: Secondary | ICD-10-CM

## 2019-04-22 LAB — POCT URINALYSIS DIP (DEVICE)
Bilirubin Urine: NEGATIVE
Glucose, UA: NEGATIVE mg/dL
Ketones, ur: NEGATIVE mg/dL
Leukocytes,Ua: NEGATIVE
Nitrite: POSITIVE — AB
Protein, ur: NEGATIVE mg/dL
Specific Gravity, Urine: >=1.03 (ref 1.005–1.030)
Urobilinogen, UA: 0.2 mg/dL (ref 0.0–1.0)
pH: 6.5 (ref 5.0–8.0)

## 2019-04-22 LAB — POC URINE PREG, ED: Preg Test, Ur: NEGATIVE

## 2019-04-22 LAB — POCT PREGNANCY, URINE: Preg Test, Ur: NEGATIVE

## 2019-04-22 MED ORDER — BENZONATATE 100 MG PO CAPS: 100.0000 mg | ORAL_CAPSULE | Freq: Three times a day (TID) | ORAL | 0 refills | Status: DC

## 2019-04-22 MED ORDER — CETIRIZINE-PSEUDOEPHEDRINE ER 5-120 MG PO TB12: 1.0000 | ORAL_TABLET | Freq: Every day | ORAL | 0 refills | Status: DC

## 2019-04-22 MED ORDER — CEFTRIAXONE SODIUM 500 MG IJ SOLR
500.0000 mg | Freq: Once | INTRAMUSCULAR | Status: AC
  Administered 2019-04-22: 500 mg via INTRAMUSCULAR

## 2019-04-22 MED ORDER — NITROFURANTOIN MONOHYD MACRO 100 MG PO CAPS: 100.0000 mg | ORAL_CAPSULE | Freq: Two times a day (BID) | ORAL | 0 refills | Status: DC

## 2019-04-22 MED ORDER — CEFTRIAXONE SODIUM 500 MG IJ SOLR
INTRAMUSCULAR | Status: AC
  Filled 2019-04-22: qty 500

## 2019-04-22 NOTE — ED Triage Notes (Signed)
Pt c/o cough, fever, nasal congestion, HA, sore throat, abd pain, n/v, lower back pain. Loss of taste/smell x 3 days.   Also states was exposed to someone with gonorrhea. C/o vag discharge, dysuria sx.

## 2019-04-22 NOTE — ED Provider Notes (Signed)
[facility]    CSN: [id number] Arrival date & time: 04/22/19  0805      History   Chief Complaint Chief Complaint  Patient presents with  . Cough  . Fever  . Exposure to STD    HPI Sharon Johnston is a 29 y.o. female.   Pt is a 29 year old female with PMH of chlamydia, depression, gonorrhea, HTN. She presents today with 3 days of cough, fever, nasal congestion, HA, sore throat, n/v, back pain and loss of taste and smell. Symptoms have been constant. Close exposure to COVID. No chest pain, SOB. Has not taken anything for the symptoms.   She is also having vaginal discharge. Reports that she was exposed to gonorrhea. Having some associated dysuria. Discharge is purulent and odorous. No abdominal pain, pelvic pain. Patient's last menstrual period was 04/08/2019 (approximate).   ROS per HPI    Cough Associated symptoms: fever   Fever Associated symptoms: cough   Exposure to STD    Past Medical History:  Diagnosis Date  . Chlamydia   . Depression    hospitalized for 2 weeks after suicide att was post partum  . Hx of gonorrhea   . Hypertension    gestational HTN  . Hypertension   . Kidney infection     Patient Active Problem List   Diagnosis Date Noted  . Insertion of implantable subdermal contraceptive 06/22/2017  . Chronic hypertension 10/31/2015  . Depression 06/15/2013    Past Surgical History:  Procedure Laterality Date  . NO PAST SURGERIES      OB History    Gravida  6   Para  4   Term  4   Preterm  0   AB  2   Living  4     SAB  1   TAB  1   Ectopic  0   Multiple  0   Live Births  4            Home Medications    Prior to Admission medications   Medication Sig Start Date End Date Taking? Authorizing Provider  amLODipine (NORVASC) 10 MG tablet Take 1 tablet (10 mg total) by mouth daily. 11/01/18   Eustace Moore, MD  benzonatate (TESSALON) 100 MG capsule Take 1 capsule (100 mg total) by mouth every 8 (eight)  hours. 04/22/19   Lusia Greis, Gloris Manchester A, NP  cetirizine-pseudoephedrine (ZYRTEC-D) 5-120 MG tablet Take 1 tablet by mouth daily. 04/22/19   Dahlia Byes A, NP  nitrofurantoin, macrocrystal-monohydrate, (MACROBID) 100 MG capsule Take 1 capsule (100 mg total) by mouth 2 (two) times daily. 04/22/19   Dahlia Byes A, NP  ferrous sulfate 325 (65 FE) MG tablet Take 1 tablet (325 mg total) by mouth 2 (two) times daily with a meal. 04/25/17 11/01/18  Pincus Large, DO  medroxyPROGESTERone (DEPO-PROVERA) 150 MG/ML injection Inject 1 mL (150 mg total) into the muscle every 3 (three) months. 07/26/17 04/22/19  Adam Phenix, MD    Family History Family History  Problem Relation Age of Onset  . Hypertension Mother   . Hypertension Maternal Grandmother   . Hypertension Maternal Aunt   . Depression Cousin     Social History Social History   Tobacco Use  . Smoking status: Current Some Day Smoker    Packs/day: 0.25    Types: Cigars  . Smokeless tobacco: Never Used  Substance Use Topics  . Alcohol use: No  . Drug use: No  Allergies   Patient has no known allergies.   Review of Systems Review of Systems  Constitutional: Positive for fever.  Respiratory: Positive for cough.      Physical Exam Triage Vital Signs ED Triage Vitals  Enc Vitals Group     BP 04/22/19 0830 (!) 161/98     Pulse Rate 04/22/19 0830 71     Resp 04/22/19 0830 16     Temp 04/22/19 0830 98.6 F (37 C)     Temp Source 04/22/19 0830 Oral     SpO2 04/22/19 0830 100 %     Weight --      Height --      Head Circumference --      Peak Flow --      Pain Score 04/22/19 0826 6     Pain Loc --      Pain Edu? --      Excl. in [place]? --    No data found.  Updated Vital Signs BP (!) 161/98 (BP Location: Right Arm)   Pulse 71   Temp 98.6 F (37 C) (Oral)   Resp 16   LMP 04/08/2019 (Approximate)   SpO2 100%   Visual Acuity Right Eye Distance:   Left Eye Distance:   Bilateral Distance:    Right Eye Near:   Left Eye Near:     Bilateral Near:     Physical Exam Vitals and nursing note reviewed.  Constitutional:      General: She is not in acute distress.    Appearance: Normal appearance. She is not ill-appearing, toxic-appearing or diaphoretic.  HENT:     Head: Normocephalic.     Nose: Congestion and rhinorrhea present.     Mouth/Throat:     Pharynx: Oropharynx is clear.  Eyes:     Conjunctiva/sclera: Conjunctivae normal.  Cardiovascular:     Rate and Rhythm: Normal rate and regular rhythm.  Pulmonary:     Effort: Pulmonary effort is normal.     Breath sounds: Normal breath sounds.  Abdominal:     Palpations: Abdomen is soft.     Tenderness: There is no abdominal tenderness.  Musculoskeletal:        General: Normal range of motion.     Cervical back: Normal range of motion.  Skin:    General: Skin is warm and dry.     Findings: No rash.  Neurological:     Mental Status: She is alert.  Psychiatric:        Mood and Affect: Mood normal.      UC Treatments / Results  Labs (all labs ordered are listed, but only abnormal results are displayed) Labs Reviewed  POCT URINALYSIS DIP (DEVICE) - Abnormal; Notable for the following components:      Result Value   Hgb urine dipstick MODERATE (*)    Nitrite POSITIVE (*)    All other components within normal limits  NOVEL CORONAVIRUS, NAA (HOSP ORDER, SEND-OUT TO REF LAB; TAT 18-24 HRS)  URINE CULTURE  POCT PREGNANCY, URINE  POC URINE PREG, ED  CERVICOVAGINAL ANCILLARY ONLY    EKG   Radiology No results found.  Procedures Procedures (including critical care time)  Medications Ordered in UC Medications  cefTRIAXone (ROCEPHIN) injection 500 mg (500 mg Intramuscular Given 04/22/19 0921)    Initial Impression / Assessment and Plan / UC Course  I have reviewed the triage vital signs and the nursing notes.  Pertinent labs & imaging results that were available during my care of  the patient were reviewed by me and considered in my medical  decision making (see chart for details).     COVID exposure- COVID swab pending. meds sent to the pharmacy for symptoms.  quarantine precautions given.   Vaginal discharge- treated here for gonorrhea exposure.  Swab sent for testing.   UTI- urine with positive nitrite and moderate Hgb. Sent for culture.  Treating with Macrobid pending results.  Final Clinical Impressions(s) / UC Diagnoses   Final diagnoses:  Acute cystitis with hematuria  STD exposure  Close exposure to COVID-19 virus     Discharge Instructions     Treating you for a urinary tract infection Treated here in clinic for possible gonorrhea exposure.  Sent some medications to the pharmacy to helps with your upper respiratory symptoms.  COVID swab pending. Recommend quarantine until we get the results.  Follow up as needed for continued or worsening symptoms     ED Prescriptions    Medication Sig Dispense Auth. Provider   nitrofurantoin, macrocrystal-monohydrate, (MACROBID) 100 MG capsule Take 1 capsule (100 mg total) by mouth 2 (two) times daily. 10 capsule Apryll Hinkle A, NP   cetirizine-pseudoephedrine (ZYRTEC-D) 5-120 MG tablet Take 1 tablet by mouth daily. 30 tablet Abrian Hanover A, NP   benzonatate (TESSALON) 100 MG capsule Take 1 capsule (100 mg total) by mouth every 8 (eight) hours. 21 capsule Terrie Haring A, NP     PDMP not reviewed this encounter.   Janace Aris, NP 04/22/19 [phone]

## 2019-04-22 NOTE — Discharge Instructions (Addendum)
Treating you for a urinary tract infection Treated here in clinic for possible gonorrhea exposure.  Sent some medications to the pharmacy to helps with your upper respiratory symptoms.  COVID swab pending. Recommend quarantine until we get the results.  Follow up as needed for continued or worsening symptoms

## 2019-04-24 LAB — CERVICOVAGINAL ANCILLARY ONLY
Bacterial vaginitis: POSITIVE — AB
Candida vaginitis: NEGATIVE
Chlamydia: NEGATIVE
Neisseria Gonorrhea: NEGATIVE
Trichomonas: POSITIVE — AB

## 2019-04-24 LAB — URINE CULTURE: Culture: >=100000 — AB

## 2019-04-24 LAB — NOVEL CORONAVIRUS, NAA (HOSP ORDER, SEND-OUT TO REF LAB; TAT 18-24 HRS): SARS-CoV-2, NAA: DETECTED — AB

## 2019-04-25 ENCOUNTER — Encounter (HOSPITAL_COMMUNITY): Payer: Self-pay

## 2019-04-25 ENCOUNTER — Telehealth (HOSPITAL_COMMUNITY): Payer: Self-pay | Admitting: Emergency Medicine

## 2019-04-25 ENCOUNTER — Emergency Department (HOSPITAL_COMMUNITY): Payer: Medicaid - Out of State

## 2019-04-25 ENCOUNTER — Emergency Department (HOSPITAL_COMMUNITY)
Admission: EM | Admit: 2019-04-25 | Discharge: 2019-04-25 | Disposition: A | Discharge status: Home / Self Care | Payer: Medicaid - Out of State | Attending: Emergency Medicine | Admitting: Emergency Medicine

## 2019-04-25 DIAGNOSIS — A5900 Urogenital trichomoniasis, unspecified: Secondary | ICD-10-CM | POA: Diagnosis not present

## 2019-04-25 DIAGNOSIS — M7918 Myalgia, other site: Secondary | ICD-10-CM | POA: Diagnosis not present

## 2019-04-25 DIAGNOSIS — U071 COVID-19: Secondary | ICD-10-CM | POA: Diagnosis not present

## 2019-04-25 DIAGNOSIS — N760 Acute vaginitis: Secondary | ICD-10-CM | POA: Insufficient documentation

## 2019-04-25 DIAGNOSIS — I10 Essential (primary) hypertension: Secondary | ICD-10-CM | POA: Insufficient documentation

## 2019-04-25 DIAGNOSIS — A599 Trichomoniasis, unspecified: Secondary | ICD-10-CM

## 2019-04-25 DIAGNOSIS — F17210 Nicotine dependence, cigarettes, uncomplicated: Secondary | ICD-10-CM | POA: Insufficient documentation

## 2019-04-25 DIAGNOSIS — R509 Fever, unspecified: Secondary | ICD-10-CM | POA: Diagnosis present

## 2019-04-25 DIAGNOSIS — B9689 Other specified bacterial agents as the cause of diseases classified elsewhere: Secondary | ICD-10-CM

## 2019-04-25 DIAGNOSIS — Z79899 Other long term (current) drug therapy: Secondary | ICD-10-CM | POA: Diagnosis not present

## 2019-04-25 LAB — CBC
HCT: 38.5 % (ref 36.0–46.0)
Hemoglobin: 11.9 g/dL — ABNORMAL LOW (ref 12.0–15.0)
MCH: 27.4 pg (ref 26.0–34.0)
MCHC: 30.9 g/dL (ref 30.0–36.0)
MCV: 88.5 fL (ref 80.0–100.0)
Platelets: 314 10*3/uL (ref 150–400)
RBC: 4.35 MIL/uL (ref 3.87–5.11)
RDW: 13.2 % (ref 11.5–15.5)
WBC: 3.6 10*3/uL — ABNORMAL LOW (ref 4.0–10.5)
nRBC: 0 % (ref 0.0–0.2)

## 2019-04-25 LAB — BASIC METABOLIC PANEL
Anion gap: 9 (ref 5–15)
BUN: 16 mg/dL (ref 6–20)
CO2: 25 mmol/L (ref 22–32)
Calcium: 8.6 mg/dL — ABNORMAL LOW (ref 8.9–10.3)
Chloride: 106 mmol/L (ref 98–111)
Creatinine, Ser: 0.69 mg/dL (ref 0.44–1.00)
GFR calc Af Amer: >60 mL/min (ref >60)
GFR calc non Af Amer: >60 mL/min (ref >60)
Glucose, Bld: 99 mg/dL (ref 70–99)
Potassium: 3.9 mmol/L (ref 3.5–5.1)
Sodium: 140 mmol/L (ref 135–145)

## 2019-04-25 MED ORDER — METRONIDAZOLE 500 MG PO TABS: 500.0000 mg | ORAL_TABLET | Freq: Two times a day (BID) | ORAL | 0 refills | Status: AC

## 2019-04-25 NOTE — Telephone Encounter (Signed)
Pt currently in [facility], notified Dr. Jeraldine Loots of pt recent labs and untreated bv and trich.

## 2019-04-25 NOTE — ED Triage Notes (Addendum)
Patient covid positive yesterday.   Patient arrived via [facility] from hotel.   C/O chills, fever, and shob.  Denies chest pain or N/V   A/ox4 Ambulatory with ems   Temp-101.4 BP-140/88 RR-16 100% RA

## 2019-04-25 NOTE — Discharge Instructions (Addendum)
[name],  Thank you for choosing [facility] for your healthcare needs. During your visit, we addressed your COVID symptoms, and gave you medication for your other conditions. Please take the medications as directed (1 pill every 12 hours for 7 days). Please come back if you begin to experience severe shortness of breath or acute deterioration in your current health.

## 2019-04-25 NOTE — ED Provider Notes (Signed)
[facility]   CSN: [id number] Arrival date & time: 04/25/19  1158     History Chief Complaint  Patient presents with  . COVID POSITIVE  . Fever    [name] is a 29 y/o female with a history of multiple STI visits, hypertension, and + COVID 19 (04/24/2019), who presents to the [facility] ED for COVID 19. Ms. Cragg states that she has had diarrhea, loss of taste, fatgiue, and myalgias since 04/24/2019. She denies any nausea or vomiting.   Patient also presents with new findings of bacterial vaginosis and trichomoniasis from her visit on 04/22/2019.        Past Medical History:  Diagnosis Date  . Chlamydia   . Depression    hospitalized for 2 weeks after suicide att was post partum  . Hx of gonorrhea   . Hypertension    gestational HTN  . Hypertension   . Kidney infection     Patient Active Problem List   Diagnosis Date Noted  . Insertion of implantable subdermal contraceptive 06/22/2017  . Chronic hypertension 10/31/2015  . Depression 06/15/2013    Past Surgical History:  Procedure Laterality Date  . NO PAST SURGERIES       OB History    Gravida  6   Para  4   Term  4   Preterm  0   AB  2   Living  4     SAB  1   TAB  1   Ectopic  0   Multiple  0   Live Births  4           Family History  Problem Relation Age of Onset  . Hypertension Mother   . Hypertension Maternal Grandmother   . Hypertension Maternal Aunt   . Depression Cousin     Social History   Tobacco Use  . Smoking status: Current Some Day Smoker    Packs/day: 0.25    Types: Cigars  . Smokeless tobacco: Never Used  Substance Use Topics  . Alcohol use: No  . Drug use: No    Home Medications Prior to Admission medications   Medication Sig Start Date End Date Taking? Authorizing Provider  amLODipine (NORVASC) 10 MG tablet Take 1 tablet (10 mg total) by mouth daily. 11/01/18  Yes Eustace Moore, MD  benzonatate (TESSALON)  100 MG capsule Take 1 capsule (100 mg total) by mouth every 8 (eight) hours. Patient taking differently: Take 100 mg by mouth 3 (three) times daily as needed for cough.  04/22/19  Yes Bast, Traci A, NP  nitrofurantoin, macrocrystal-monohydrate, (MACROBID) 100 MG capsule Take 1 capsule (100 mg total) by mouth 2 (two) times daily. 04/22/19  Yes Bast, Traci A, NP  cetirizine-pseudoephedrine (ZYRTEC-D) 5-120 MG tablet Take 1 tablet by mouth daily. 04/22/19   Dahlia Byes A, NP  metroNIDAZOLE (FLAGYL) 500 MG tablet Take 1 tablet (500 mg total) by mouth 2 (two) times daily for 7 days. 04/25/19 05/02/19  Dolan Amen, MD  ferrous sulfate 325 (65 FE) MG tablet Take 1 tablet (325 mg total) by mouth 2 (two) times daily with a meal. 04/25/17 11/01/18  Pincus Large, DO  medroxyPROGESTERone (DEPO-PROVERA) 150 MG/ML injection Inject 1 mL (150 mg total) into the muscle every 3 (three) months. 07/26/17 04/22/19  Adam Phenix, MD    Allergies    Patient has no known allergies.  Review of Systems   Review of Systems  Constitutional: Positive  for chills and fatigue. Negative for diaphoresis and fever.  HENT: Negative for rhinorrhea, sinus pain and sore throat.   Respiratory: Positive for shortness of breath. Negative for cough, chest tightness and wheezing.   Cardiovascular: Positive for palpitations. Negative for chest pain and leg swelling.  Gastrointestinal: Positive for diarrhea. Negative for constipation, nausea and vomiting.  Genitourinary: Negative for pelvic pain.  Musculoskeletal: Positive for myalgias. Negative for arthralgias, back pain and joint swelling.  Skin: Negative for rash.  Neurological: Negative for numbness and headaches.    Physical Exam Updated Vital Signs BP 122/65   Pulse 63   Temp 98.1 F (36.7 C) (Oral)   Resp 17   LMP 04/08/2019 (Approximate)   SpO2 98%   Physical Exam  ED Results / Procedures / Treatments   Labs (all labs ordered are listed, but only abnormal results are  displayed) Labs Reviewed  CBC - Abnormal; Notable for the following components:      Result Value   WBC 3.6 (*)    Hemoglobin 11.9 (*)    All other components within normal limits  BASIC METABOLIC PANEL - Abnormal; Notable for the following components:   Calcium 8.6 (*)    All other components within normal limits    EKG None  Radiology DG Chest Portable 1 View  Result Date: 04/25/2019 CLINICAL DATA:  COVID-19 positive yesterday, fever and chills, short of breath EXAM: PORTABLE CHEST 1 VIEW COMPARISON:  04/15/2019 FINDINGS: The heart size and mediastinal contours are within normal limits. Both lungs are clear. The visualized skeletal structures are unremarkable. IMPRESSION: No active disease. Electronically Signed   By: Randa Ngo M.D.   On: 04/25/2019 13:36    Procedures Procedures (including critical care time)  Medications Ordered in ED Medications - No data to display  ED Course  I have reviewed the triage vital signs and the nursing notes.  Pertinent labs & imaging results that were available during my care of the patient were reviewed by me and considered in my medical decision making (see chart for details).  Patient came to the ED with a + COVID 19 test with shortness of breath, fatigue, loss of taste, and diarrhea. During her visit, her chest xray was clear and her labs were unremarkable. Her physical examination and vitals were within normal limits, and she did not appear in acute distress. She does not require O2 supplementation and is on 100% on RA.    Additionally, her labs from her prior ED visit revealed that she has trichomoniasis and bacterial vaginosis. She will be started on Flagyl 500 mg BID for a 7 day course for treatment of both.    Final Clinical Impression(s) / ED Diagnoses Final diagnoses:  COVID-19  Trichimoniasis  Bacterial vaginosis    Rx / DC Orders ED Discharge Orders         Ordered    metroNIDAZOLE (FLAGYL) 500 MG tablet  2 times daily      04/25/19 1515           Maudie Mercury, MD 04/25/19 1518    Carmin Muskrat, MD 04/25/19 [phone]

## 2019-05-01 ENCOUNTER — Emergency Department (HOSPITAL_COMMUNITY)
Admission: EM | Admit: 2019-05-01 | Discharge: 2019-05-01 | Disposition: A | Discharge status: Home / Self Care | Payer: Medicaid - Out of State | Attending: Emergency Medicine | Admitting: Emergency Medicine

## 2019-05-01 ENCOUNTER — Other Ambulatory Visit: Payer: Self-pay

## 2019-05-01 ENCOUNTER — Encounter (HOSPITAL_COMMUNITY): Payer: Self-pay | Admitting: Emergency Medicine

## 2019-05-01 DIAGNOSIS — F410 Panic disorder [episodic paroxysmal anxiety] without agoraphobia: Secondary | ICD-10-CM | POA: Insufficient documentation

## 2019-05-01 DIAGNOSIS — R0602 Shortness of breath: Secondary | ICD-10-CM | POA: Diagnosis present

## 2019-05-01 DIAGNOSIS — F458 Other somatoform disorders: Secondary | ICD-10-CM

## 2019-05-01 DIAGNOSIS — Z79899 Other long term (current) drug therapy: Secondary | ICD-10-CM | POA: Diagnosis not present

## 2019-05-01 DIAGNOSIS — F17290 Nicotine dependence, other tobacco product, uncomplicated: Secondary | ICD-10-CM | POA: Insufficient documentation

## 2019-05-01 DIAGNOSIS — I10 Essential (primary) hypertension: Secondary | ICD-10-CM | POA: Diagnosis not present

## 2019-05-01 NOTE — Discharge Instructions (Addendum)
Contact a health care provider if: °Your symptoms do not improve, or they get worse. °You are not able to take your medicine as prescribed because of side effects. °Get help right away if: °You have serious thoughts about hurting yourself or others. °You have symptoms of a panic attack. Do not drive yourself to the hospital. Have someone else drive you or call an ambulance. °

## 2019-05-01 NOTE — ED Provider Notes (Signed)
[facility]-EMERGENCY DEPT Provider Note   CSN: [id number] Arrival date & time: 05/01/19  0431     History Chief Complaint  Patient presents with  . Shortness of Breath    Sharon Johnston is a 29 y.o. female patient awoke from sleep around 3:00 in the morning with an overwhelming sense of dread, throat closing sensation, panic, and felt as if she could not breathe.  She was hyperventilating.  She states that her hands went numb and she called the ambulance.  She states that she only has these when she is pregnant and she is not sure if she is pregnant.  She denies any active chest pain, shortness of breath.  Patient sleeping soundly when I entered the room to evaluate her with totally normal vital signs.  HPI     Past Medical History:  Diagnosis Date  . Chlamydia   . Depression    hospitalized for 2 weeks after suicide att was post partum  . Hx of gonorrhea   . Hypertension    gestational HTN  . Hypertension   . Kidney infection     Patient Active Problem List   Diagnosis Date Noted  . Insertion of implantable subdermal contraceptive 06/22/2017  . Chronic hypertension 10/31/2015  . Depression 06/15/2013    Past Surgical History:  Procedure Laterality Date  . NO PAST SURGERIES       OB History    Gravida  6   Para  4   Term  4   Preterm  0   AB  2   Living  4     SAB  1   TAB  1   Ectopic  0   Multiple  0   Live Births  4           Family History  Problem Relation Age of Onset  . Hypertension Mother   . Hypertension Maternal Grandmother   . Hypertension Maternal Aunt   . Depression Cousin     Social History   Tobacco Use  . Smoking status: Current Some Day Smoker    Packs/day: 0.25    Types: Cigars  . Smokeless tobacco: Never Used  Substance Use Topics  . Alcohol use: No  . Drug use: No    Home Medications Prior to Admission medications   Medication Sig Start Date End Date Taking? Authorizing Provider    amLODipine (NORVASC) 10 MG tablet Take 1 tablet (10 mg total) by mouth daily. 11/01/18   Eustace Moore, MD  benzonatate (TESSALON) 100 MG capsule Take 1 capsule (100 mg total) by mouth every 8 (eight) hours. Patient taking differently: Take 100 mg by mouth 3 (three) times daily as needed for cough.  04/22/19   Bast, Gloris Manchester A, NP  cetirizine-pseudoephedrine (ZYRTEC-D) 5-120 MG tablet Take 1 tablet by mouth daily. 04/22/19   Dahlia Byes A, NP  metroNIDAZOLE (FLAGYL) 500 MG tablet Take 1 tablet (500 mg total) by mouth 2 (two) times daily for 7 days. 04/25/19 05/02/19  Dolan Amen, MD  nitrofurantoin, macrocrystal-monohydrate, (MACROBID) 100 MG capsule Take 1 capsule (100 mg total) by mouth 2 (two) times daily. 04/22/19   Dahlia Byes A, NP  ferrous sulfate 325 (65 FE) MG tablet Take 1 tablet (325 mg total) by mouth 2 (two) times daily with a meal. 04/25/17 11/01/18  Pincus Large, DO  medroxyPROGESTERone (DEPO-PROVERA) 150 MG/ML injection Inject 1 mL (150 mg total) into the muscle every 3 (three) months. 07/26/17 04/22/19  Woodroe Mode, MD    Allergies    Patient has no known allergies.  Review of Systems   Review of Systems Ten systems reviewed and are negative for acute change, except as noted in the HPI.   Physical Exam Updated Vital Signs BP 111/70   Pulse 65   Temp 98.5 F (36.9 C) (Oral)   Resp 18   Ht 5\' 3"  (1.6 m)   Wt 59 kg   LMP 04/08/2019 (Approximate)   SpO2 100%   BMI 23.03 kg/m   Physical Exam Vitals and nursing note reviewed.  Constitutional:      General: She is not in acute distress.    Appearance: She is well-developed. She is not diaphoretic.  HENT:     Head: Normocephalic and atraumatic.  Eyes:     General: No scleral icterus.    Conjunctiva/sclera: Conjunctivae normal.  Cardiovascular:     Rate and Rhythm: Normal rate and regular rhythm.     Heart sounds: Normal heart sounds. No murmur. No friction rub. No gallop.   Pulmonary:     Effort: Pulmonary effort  is normal. No respiratory distress.     Breath sounds: Normal breath sounds.  Abdominal:     General: Bowel sounds are normal. There is no distension.     Palpations: Abdomen is soft. There is no mass.     Tenderness: There is no abdominal tenderness. There is no guarding.  Musculoskeletal:     Cervical back: Normal range of motion.     Right lower leg: No edema.     Left lower leg: No edema.  Skin:    General: Skin is warm and dry.  Neurological:     Mental Status: She is alert and oriented to person, place, and time.  Psychiatric:        Behavior: Behavior normal.     ED Results / Procedures / Treatments   Labs (all labs ordered are listed, but only abnormal results are displayed) Labs Reviewed - No data to display  EKG None  Radiology No results found.  Procedures Procedures (including critical care time)  Medications Ordered in ED Medications - No data to display  ED Course  I have reviewed the triage vital signs and the nursing notes.  Pertinent labs & imaging results that were available during my care of the patient were reviewed by me and considered in my medical decision making (see chart for details).    MDM Rules/Calculators/A&P                      Patient here with admitted panic attack.  It sounds as if she was having hyperventilation syndrome.  Patient is PERC negative here.  She was diagnosed with Covid over 14 days ago and is having no symptoms from that.  She has no leg swelling.  The patient is safe for discharge at this time.  Given outpatient resources.  She may take it at home pregnancy test. Final Clinical Impression(s) / ED Diagnoses Final diagnoses:  None    Rx / DC Orders ED Discharge Orders    None       Margarita Mail, PA-C 47/82/95 [id number]    Delora Fuel, MD 08/65/78 [phone]

## 2019-05-01 NOTE — ED Triage Notes (Signed)
Pt called EMS because she felt like she could not breathe. Pt felt like her fingers were numb, she states that this could be from anxiety. Pt tested positive for covid two weeks ago.

## 2019-05-04 ENCOUNTER — Encounter (HOSPITAL_COMMUNITY): Payer: Self-pay

## 2019-05-04 ENCOUNTER — Ambulatory Visit (HOSPITAL_COMMUNITY)
Admission: EM | Admit: 2019-05-04 | Discharge: 2019-05-04 | Disposition: A | Discharge status: Home / Self Care | Payer: Medicaid - Out of State | Attending: Physician Assistant | Admitting: Physician Assistant

## 2019-05-04 ENCOUNTER — Other Ambulatory Visit: Payer: Self-pay

## 2019-05-04 DIAGNOSIS — Z8619 Personal history of other infectious and parasitic diseases: Secondary | ICD-10-CM | POA: Insufficient documentation

## 2019-05-04 DIAGNOSIS — U071 COVID-19: Secondary | ICD-10-CM | POA: Insufficient documentation

## 2019-05-04 DIAGNOSIS — Z3202 Encounter for pregnancy test, result negative: Secondary | ICD-10-CM

## 2019-05-04 DIAGNOSIS — R102 Pelvic and perineal pain: Secondary | ICD-10-CM | POA: Insufficient documentation

## 2019-05-04 DIAGNOSIS — Z202 Contact with and (suspected) exposure to infections with a predominantly sexual mode of transmission: Secondary | ICD-10-CM | POA: Insufficient documentation

## 2019-05-04 LAB — POCT PREGNANCY, URINE: Preg Test, Ur: NEGATIVE

## 2019-05-04 LAB — POCT URINALYSIS DIP (DEVICE)
Bilirubin Urine: NEGATIVE
Glucose, UA: NEGATIVE mg/dL
Ketones, ur: NEGATIVE mg/dL
Nitrite: NEGATIVE
Protein, ur: NEGATIVE mg/dL
Specific Gravity, Urine: 1.025 (ref 1.005–1.030)
Urobilinogen, UA: 1 mg/dL (ref 0.0–1.0)
pH: 6.5 (ref 5.0–8.0)

## 2019-05-04 LAB — POC URINE PREG, ED: Preg Test, Ur: NEGATIVE

## 2019-05-04 LAB — HIV ANTIBODY (ROUTINE TESTING W REFLEX): HIV Screen 4th Generation wRfx: NONREACTIVE

## 2019-05-04 MED ORDER — STERILE WATER FOR INJECTION IJ SOLN
INTRAMUSCULAR | Status: AC
  Filled 2019-05-04: qty 10

## 2019-05-04 MED ORDER — DOXYCYCLINE HYCLATE 100 MG PO CAPS: 100.0000 mg | ORAL_CAPSULE | Freq: Two times a day (BID) | ORAL | 0 refills | Status: DC

## 2019-05-04 MED ORDER — CEFTRIAXONE SODIUM 500 MG IJ SOLR
500.0000 mg | Freq: Once | INTRAMUSCULAR | Status: AC
  Administered 2019-05-04: 500 mg via INTRAMUSCULAR

## 2019-05-04 MED ORDER — DOXYCYCLINE HYCLATE 100 MG PO CAPS: 100.0000 mg | ORAL_CAPSULE | Freq: Two times a day (BID) | ORAL | 0 refills | Status: AC

## 2019-05-04 MED ORDER — METRONIDAZOLE 500 MG PO TABS: 500.0000 mg | ORAL_TABLET | Freq: Two times a day (BID) | ORAL | 0 refills | Status: DC

## 2019-05-04 MED ORDER — CEFTRIAXONE SODIUM 500 MG IJ SOLR
INTRAMUSCULAR | Status: AC
  Filled 2019-05-04: qty 500

## 2019-05-04 NOTE — Discharge Instructions (Signed)
Take the doxycyline 2 times a day for 14 days Take the metronidazole for an additional 7 days  We have sent a urine culture and will contact you if we need to add a medication  Please isolate for 3-4 more days until your COVID symptoms resolve  Do NOT have sexual intercourse until you have completed treatment. Always use condoms.  If you develop fever and have worsening abdominal pain please go to the Emergency Department.

## 2019-05-04 NOTE — ED Provider Notes (Signed)
[facility]    CSN: [id number] Arrival date & time: 05/04/19  1638      History   Chief Complaint Chief Complaint  Patient presents with  . Abdominal Pain    HPI Sharon Johnston is a 29 y.o. female.   Patient reports to urgent care today for gonorrhea exposure 2 days ago, lower abdominal pain. She is also COVID positive since 2/1 with sore throat, cough, congestion. These symptoms have been resolving. Also at her 2/1 visit she tested positive for trichomonas and a UTI. She did not start treatment for trichomoniasis until 2-3 days following after being reevaluated at the ED on 2/4 for other concerns. She reports she has not completed this course of medication yet.  She reports her lower abdominal pain has been present for a long time and is not necessarily new. She denies fever or chills. She also has concerns of whether she may be pregnant currently, as her menstrual cycle is a few days late, LMP on 1/10. She denies additional vaginal discharge or pain. Denies painful urination but endorses strong odor.   She has a history of PID and numerous STDs. She has not been isolating with her COVID infection and reports continued sexual intercourse despite being advised to abstain until all treatments are complete.     Past Medical History:  Diagnosis Date  . Chlamydia   . Depression    hospitalized for 2 weeks after suicide att was post partum  . Hx of gonorrhea   . Hypertension    gestational HTN  . Hypertension   . Kidney infection     Patient Active Problem List   Diagnosis Date Noted  . Insertion of implantable subdermal contraceptive 06/22/2017  . Chronic hypertension 10/31/2015  . Depression 06/15/2013    Past Surgical History:  Procedure Laterality Date  . NO PAST SURGERIES      OB History    Gravida  6   Para  4   Term  4   Preterm  0   AB  2   Living  4     SAB  1   TAB  1   Ectopic  0   Multiple  0   Live Births  4             Home Medications    Prior to Admission medications   Medication Sig Start Date End Date Taking? Authorizing Provider  amLODipine (NORVASC) 10 MG tablet Take 1 tablet (10 mg total) by mouth daily. 11/01/18  Yes Eustace Moore, MD  benzonatate (TESSALON) 100 MG capsule Take 1 capsule (100 mg total) by mouth every 8 (eight) hours. Patient taking differently: Take 100 mg by mouth 3 (three) times daily as needed for cough.  04/22/19   Bast, Gloris Manchester A, NP  cetirizine-pseudoephedrine (ZYRTEC-D) 5-120 MG tablet Take 1 tablet by mouth daily. 04/22/19   Dahlia Byes A, NP  doxycycline (VIBRAMYCIN) 100 MG capsule Take 1 capsule (100 mg total) by mouth 2 (two) times daily for 14 days. 05/04/19 05/18/19  Malory Spurr, Veryl Speak, PA-C  metroNIDAZOLE (FLAGYL) 500 MG tablet Take 1 tablet (500 mg total) by mouth 2 (two) times daily. 05/04/19   Deandre Stansel, Veryl Speak, PA-C  nitrofurantoin, macrocrystal-monohydrate, (MACROBID) 100 MG capsule Take 1 capsule (100 mg total) by mouth 2 (two) times daily. 04/22/19   Dahlia Byes A, NP  ferrous sulfate 325 (65 FE) MG tablet Take 1 tablet (325 mg total) by mouth 2 (two) times daily  with a meal. 04/25/17 11/01/18  Pincus Large, DO  medroxyPROGESTERone (DEPO-PROVERA) 150 MG/ML injection Inject 1 mL (150 mg total) into the muscle every 3 (three) months. 07/26/17 04/22/19  Adam Phenix, MD    Family History Family History  Problem Relation Age of Onset  . Hypertension Mother   . Hypertension Maternal Grandmother   . Healthy Father   . Hypertension Maternal Aunt   . Depression Cousin     Social History Social History   Tobacco Use  . Smoking status: Current Some Day Smoker    Packs/day: 0.25    Types: Cigars  . Smokeless tobacco: Never Used  . Tobacco comment: two black and milds per day  Substance Use Topics  . Alcohol use: No  . Drug use: No     Allergies   Patient has no known allergies.   Review of Systems Review of Systems  Constitutional: Negative for chills, fatigue  and fever.  HENT: Positive for congestion, postnasal drip, rhinorrhea and sore throat.   Respiratory: Positive for cough. Negative for choking and shortness of breath.   Cardiovascular: Negative for chest pain and palpitations.  Gastrointestinal: Positive for abdominal pain. Negative for diarrhea, nausea and vomiting.  Genitourinary: Positive for vaginal discharge. Negative for dysuria, flank pain, pelvic pain, urgency, vaginal bleeding and vaginal pain.  Musculoskeletal: Negative for arthralgias, back pain and myalgias.  Skin: Negative for color change and rash.  Neurological: Negative for headaches.     Physical Exam Triage Vital Signs ED Triage Vitals [05/04/19 1731]  Enc Vitals Group     BP      Pulse      Resp      Temp      Temp src      SpO2      Weight      Height      Head Circumference      Peak Flow      Pain Score 6     Pain Loc      Pain Edu?      Excl. in GC?    No data found.  Updated Vital Signs BP (!) 122/92 (BP Location: Left Arm)   Pulse 69   Temp 99.3 F (37.4 C) (Oral)   Resp 18   LMP 03/31/2019   SpO2 99%   Visual Acuity Right Eye Distance:   Left Eye Distance:   Bilateral Distance:    Right Eye Near:   Left Eye Near:    Bilateral Near:     Physical Exam Vitals and nursing note reviewed.  Constitutional:      General: She is not in acute distress.    Appearance: She is well-developed and normal weight. She is not ill-appearing.  HENT:     Head: Normocephalic and atraumatic.     Mouth/Throat:     Mouth: Mucous membranes are moist.     Pharynx: Oropharynx is clear.  Eyes:     Extraocular Movements: Extraocular movements intact.     Conjunctiva/sclera: Conjunctivae normal.     Pupils: Pupils are equal, round, and reactive to light.  Cardiovascular:     Rate and Rhythm: Normal rate and regular rhythm.     Heart sounds: No murmur.  Pulmonary:     Effort: Pulmonary effort is normal. No respiratory distress.  Abdominal:      General: Abdomen is flat.     Palpations: Abdomen is soft.     Tenderness: There is abdominal tenderness in the  right lower quadrant and suprapubic area. There is no right CVA tenderness, left CVA tenderness or guarding.     Hernia: No hernia is present.  Genitourinary:    Comments: Exam deferred at patient preference Musculoskeletal:     Cervical back: Neck supple.  Skin:    General: Skin is warm and dry.     Findings: No rash.  Neurological:     General: No focal deficit present.     Mental Status: She is alert and oriented to person, place, and time.  Psychiatric:        Mood and Affect: Mood normal.        Behavior: Behavior normal.      UC Treatments / Results  Labs (all labs ordered are listed, but only abnormal results are displayed) Labs Reviewed  POCT URINALYSIS DIP (DEVICE) - Abnormal; Notable for the following components:      Result Value   Hgb urine dipstick MODERATE (*)    Leukocytes,Ua TRACE (*)    All other components within normal limits  URINE CULTURE  HIV ANTIBODY (ROUTINE TESTING W REFLEX)  RPR  POC URINE PREG, ED  POCT PREGNANCY, URINE  CERVICOVAGINAL ANCILLARY ONLY    EKG   Radiology No results found.  Procedures Procedures (including critical care time)  Medications Ordered in UC Medications  cefTRIAXone (ROCEPHIN) injection 500 mg (500 mg Intramuscular Given 05/04/19 1813)    Initial Impression / Assessment and Plan / UC Course  I have reviewed the triage vital signs and the nursing notes.  Pertinent labs & imaging results that were available during my care of the patient were reviewed by me and considered in my medical decision making (see chart for details).     #Exposure to gonorrhea #pelvic pain Patient is non-compliant with instructions relating to sexual activity and COVID precautions. Lengthy history STD positives and exposures. Some concern about compliance with medications. We discussed the need to complete all treatment  courses and cease sexual activity. Given lower abdominal pain and equivocal UA and apparent high risk sexual behavior, will extend treatment course of doxycycline and metronidazole, though she is afebrile and mostly well appearing. Rocephin given in clinic. Urine culture sent.  - Swab sent, HIV and RPR sent  Final Clinical Impressions(s) / UC Diagnoses   Final diagnoses:  Exposure to gonorrhea  Pelvic pain in female  History of trichomoniasis  COVID-19     Discharge Instructions     Take the doxycyline 2 times a day for 14 days Take the metronidazole for an additional 7 days  We have sent a urine culture and will contact you if we need to add a medication  Please isolate for 3-4 more days until your COVID symptoms resolve  Do NOT have sexual intercourse until you have completed treatment. Always use condoms.  If you develop fever and have worsening abdominal pain please go to the Emergency Department.      ED Prescriptions    Medication Sig Dispense Auth. Provider   doxycycline (VIBRAMYCIN) 100 MG capsule  (Status: Discontinued) Take 1 capsule (100 mg total) by mouth 2 (two) times daily for 7 days. 14 capsule Minta Fair, Marguerita Beards, PA-C   doxycycline (VIBRAMYCIN) 100 MG capsule Take 1 capsule (100 mg total) by mouth 2 (two) times daily for 14 days. 28 capsule Orchid Glassberg, Marguerita Beards, PA-C   metroNIDAZOLE (FLAGYL) 500 MG tablet Take 1 tablet (500 mg total) by mouth 2 (two) times daily. 14 tablet Mayra Brahm, Marguerita Beards, PA-C  PDMP not reviewed this encounter.   Hermelinda Medicus, PA-C 05/05/19 [phone]

## 2019-05-04 NOTE — ED Triage Notes (Signed)
Patient presents to Urgent Care with complaints of lower abdominal pain and possible exposure to an STD since 2 days ago. Patient reports she thinks she may also be pregnant. Pt had a positive covid test less than two weeks ago, has not been isolating.

## 2019-05-05 LAB — URINE CULTURE

## 2019-05-05 LAB — RPR: RPR Ser Ql: NONREACTIVE

## 2019-05-07 LAB — CERVICOVAGINAL ANCILLARY ONLY
Bacterial vaginitis: POSITIVE — AB
Candida vaginitis: POSITIVE — AB
Chlamydia: NEGATIVE
Neisseria Gonorrhea: NEGATIVE
Trichomonas: POSITIVE — AB

## 2019-05-08 ENCOUNTER — Telehealth (HOSPITAL_COMMUNITY): Payer: Self-pay | Admitting: Emergency Medicine

## 2019-05-08 ENCOUNTER — Encounter (HOSPITAL_COMMUNITY): Payer: Self-pay

## 2019-05-08 MED ORDER — FLUCONAZOLE 150 MG PO TABS: 150.0000 mg | ORAL_TABLET | Freq: Once | ORAL | 0 refills | Status: AC

## 2019-05-08 NOTE — Telephone Encounter (Signed)
Bacterial Vaginosis test is positive.  Prescription for metronidazole was given at the urgent care visit.  Trichomonas is positive. Rx metronidazole was given at the urgent care visit. Pt needs education to please refrain from sexual intercourse for 7 days to give the medicine time to work. Sexual partners need to be notified and tested/treated. Condoms may reduce risk of reinfection. Recheck for further evaluation if symptoms are not improving.   Test for candida (yeast) was positive.  Prescription for fluconazole 150mg  po now, repeat dose in 3d if needed, #2 no refills, sent to the pharmacy of record.  Recheck or followup with PCP for further evaluation if symptoms are not improving.    Attempted to reach patient. No answer at this time. Voicemail left.   If you have any questions, you may call me at [phone]

## 2019-06-29 ENCOUNTER — Emergency Department (HOSPITAL_BASED_OUTPATIENT_CLINIC_OR_DEPARTMENT_OTHER)
Admission: EM | Admit: 2019-06-29 | Discharge: 2019-06-29 | Disposition: A | Discharge status: Home / Self Care | Payer: Medicaid - Out of State | Attending: Emergency Medicine | Admitting: Emergency Medicine

## 2019-06-29 ENCOUNTER — Encounter (HOSPITAL_BASED_OUTPATIENT_CLINIC_OR_DEPARTMENT_OTHER): Payer: Self-pay | Admitting: Emergency Medicine

## 2019-06-29 ENCOUNTER — Other Ambulatory Visit: Payer: Self-pay

## 2019-06-29 DIAGNOSIS — I10 Essential (primary) hypertension: Secondary | ICD-10-CM | POA: Diagnosis not present

## 2019-06-29 DIAGNOSIS — N72 Inflammatory disease of cervix uteri: Secondary | ICD-10-CM | POA: Diagnosis not present

## 2019-06-29 DIAGNOSIS — R1030 Lower abdominal pain, unspecified: Secondary | ICD-10-CM | POA: Insufficient documentation

## 2019-06-29 DIAGNOSIS — B9689 Other specified bacterial agents as the cause of diseases classified elsewhere: Secondary | ICD-10-CM | POA: Insufficient documentation

## 2019-06-29 DIAGNOSIS — N898 Other specified noninflammatory disorders of vagina: Secondary | ICD-10-CM | POA: Diagnosis present

## 2019-06-29 DIAGNOSIS — F17290 Nicotine dependence, other tobacco product, uncomplicated: Secondary | ICD-10-CM | POA: Insufficient documentation

## 2019-06-29 DIAGNOSIS — N760 Acute vaginitis: Secondary | ICD-10-CM | POA: Diagnosis not present

## 2019-06-29 LAB — WET PREP, GENITAL
Sperm: NONE SEEN
Trich, Wet Prep: NONE SEEN
Yeast Wet Prep HPF POC: NONE SEEN

## 2019-06-29 LAB — URINALYSIS, ROUTINE W REFLEX MICROSCOPIC
Bilirubin Urine: NEGATIVE
Glucose, UA: NEGATIVE mg/dL
Ketones, ur: NEGATIVE mg/dL
Leukocytes,Ua: NEGATIVE
Nitrite: NEGATIVE
Protein, ur: NEGATIVE mg/dL
Specific Gravity, Urine: 1.02 (ref 1.005–1.030)
pH: 6 (ref 5.0–8.0)

## 2019-06-29 LAB — URINALYSIS, MICROSCOPIC (REFLEX)

## 2019-06-29 LAB — PREGNANCY, URINE: Preg Test, Ur: NEGATIVE

## 2019-06-29 MED ORDER — METRONIDAZOLE 500 MG PO TABS: 500.0000 mg | ORAL_TABLET | Freq: Two times a day (BID) | ORAL | 0 refills | Status: DC

## 2019-06-29 MED ORDER — DOXYCYCLINE HYCLATE 100 MG PO CAPS: 100.0000 mg | ORAL_CAPSULE | Freq: Two times a day (BID) | ORAL | 0 refills | Status: DC

## 2019-06-29 MED ORDER — DOXYCYCLINE HYCLATE 100 MG PO TABS
100.0000 mg | ORAL_TABLET | Freq: Once | ORAL | Status: AC
  Administered 2019-06-29: 15:00:00 100 mg via ORAL
  Filled 2019-06-29: qty 1

## 2019-06-29 MED ORDER — KETOROLAC TROMETHAMINE 15 MG/ML IJ SOLN
15.0000 mg | Freq: Once | INTRAMUSCULAR | Status: AC
  Administered 2019-06-29: 15 mg via INTRAMUSCULAR
  Filled 2019-06-29: qty 1

## 2019-06-29 MED ORDER — CEFTRIAXONE SODIUM 500 MG IJ SOLR
500.0000 mg | Freq: Once | INTRAMUSCULAR | Status: AC
  Administered 2019-06-29: 15:00:00 500 mg via INTRAMUSCULAR
  Filled 2019-06-29: qty 500

## 2019-06-29 NOTE — ED Triage Notes (Signed)
RLQ pain x 2 days. Endorses dysuria and vaginal discharge.

## 2019-06-29 NOTE — ED Provider Notes (Signed)
[facility] EMERGENCY DEPARTMENT Provider Note   CSN: [id number] Arrival date & time: 06/29/19  1248     History Chief Complaint  Patient presents with  . Abdominal Pain    Sharon Johnston is a 29 y.o. female.  29 yo F with a chief complaints of vaginal discharge and lower abdominal pain.  Is been going on for about 48 hours.  Denies fevers denies nausea or vomiting.  Denies trauma.  She is also concerned because she has been trying to get pregnant but has been unable to.  She thinks that something is wrong with her body and that is because of it.  The history is provided by the patient.  Abdominal Pain Pain location:  Suprapubic Pain quality: cramping   Pain radiates to:  Does not radiate Pain severity:  Moderate Onset quality:  Gradual Duration:  2 days Timing:  Constant Progression:  Worsening Chronicity:  New Relieved by:  Nothing Worsened by:  Nothing Ineffective treatments:  None tried Associated symptoms: vaginal discharge   Associated symptoms: no chest pain, no chills, no dysuria, no fever, no nausea, no shortness of breath and no vomiting        Past Medical History:  Diagnosis Date  . Chlamydia   . Depression    hospitalized for 2 weeks after suicide att was post partum  . Hx of gonorrhea   . Hypertension    gestational HTN  . Hypertension   . Kidney infection     Patient Active Problem List   Diagnosis Date Noted  . Insertion of implantable subdermal contraceptive 06/22/2017  . Chronic hypertension 10/31/2015  . Depression 06/15/2013    Past Surgical History:  Procedure Laterality Date  . NO PAST SURGERIES       OB History    Gravida  6   Para  4   Term  4   Preterm  0   AB  2   Living  4     SAB  1   TAB  1   Ectopic  0   Multiple  0   Live Births  4           Family History  Problem Relation Age of Onset  . Hypertension Mother   . Hypertension Maternal Grandmother   . Healthy Father   . Hypertension  Maternal Aunt   . Depression Cousin     Social History   Tobacco Use  . Smoking status: Current Some Day Smoker    Packs/day: 0.25    Types: Cigars  . Smokeless tobacco: Never Used  . Tobacco comment: two black and milds per day  Substance Use Topics  . Alcohol use: No  . Drug use: No    Home Medications Prior to Admission medications   Medication Sig Start Date End Date Taking? Authorizing Provider  amLODipine (NORVASC) 10 MG tablet Take 1 tablet (10 mg total) by mouth daily. 11/01/18   Eustace Moore, MD  benzonatate (TESSALON) 100 MG capsule Take 1 capsule (100 mg total) by mouth every 8 (eight) hours. Patient taking differently: Take 100 mg by mouth 3 (three) times daily as needed for cough.  04/22/19   Bast, Gloris Manchester A, NP  cetirizine-pseudoephedrine (ZYRTEC-D) 5-120 MG tablet Take 1 tablet by mouth daily. 04/22/19   Dahlia Byes A, NP  doxycycline (VIBRAMYCIN) 100 MG capsule Take 1 capsule (100 mg total) by mouth 2 (two) times daily. One po bid x 14 days 06/29/19   Adela Lank,  Ignazio Kincaid, DO  metroNIDAZOLE (FLAGYL) 500 MG tablet Take 1 tablet (500 mg total) by mouth 2 (two) times daily. One po bid x 7 days 06/29/19   Deno Etienne, DO  nitrofurantoin, macrocrystal-monohydrate, (MACROBID) 100 MG capsule Take 1 capsule (100 mg total) by mouth 2 (two) times daily. 04/22/19   Loura Halt A, NP  ferrous sulfate 325 (65 FE) MG tablet Take 1 tablet (325 mg total) by mouth 2 (two) times daily with a meal. 04/25/17 11/01/18  Katheren Shams, DO  medroxyPROGESTERone (DEPO-PROVERA) 150 MG/ML injection Inject 1 mL (150 mg total) into the muscle every 3 (three) months. 07/26/17 04/22/19  Woodroe Mode, MD    Allergies    Patient has no known allergies.  Review of Systems   Review of Systems  Constitutional: Negative for chills and fever.  HENT: Negative for congestion and rhinorrhea.   Eyes: Negative for redness and visual disturbance.  Respiratory: Negative for shortness of breath and wheezing.     Cardiovascular: Negative for chest pain and palpitations.  Gastrointestinal: Positive for abdominal pain. Negative for nausea and vomiting.  Genitourinary: Positive for pelvic pain and vaginal discharge. Negative for dysuria and urgency.  Musculoskeletal: Negative for arthralgias and myalgias.  Skin: Negative for pallor and wound.  Neurological: Negative for dizziness and headaches.    Physical Exam Updated Vital Signs BP (!) 170/127 (BP Location: Left Arm) Comment: out of meds x 1 month  Pulse 75   Temp 98.2 F (36.8 C) (Oral)   Resp 16   Ht 5\' 3"  (1.6 m)   Wt 59 kg   LMP 06/11/2019   SpO2 98%   BMI 23.03 kg/m   Physical Exam Vitals and nursing note reviewed.  Constitutional:      General: She is not in acute distress.    Appearance: She is well-developed. She is not diaphoretic.  HENT:     Head: Normocephalic and atraumatic.  Eyes:     Pupils: Pupils are equal, round, and reactive to light.  Cardiovascular:     Rate and Rhythm: Normal rate and regular rhythm.     Heart sounds: No murmur. No friction rub. No gallop.   Pulmonary:     Effort: Pulmonary effort is normal.     Breath sounds: No wheezing or rales.  Abdominal:     General: There is no distension.     Palpations: Abdomen is soft.     Tenderness: There is abdominal tenderness (mild suprapubic).  Genitourinary:    Cervix: Cervical motion tenderness and friability present.     Comments: Purulent appearing drainage at the cervix.  Cervical motion tenderness.  No unilateral tenderness. Musculoskeletal:        General: No tenderness.     Cervical back: Normal range of motion and neck supple.  Skin:    General: Skin is warm and dry.  Neurological:     Mental Status: She is alert and oriented to person, place, and time.  Psychiatric:        Behavior: Behavior normal.     ED Results / Procedures / Treatments   Labs (all labs ordered are listed, but only abnormal results are displayed) Labs Reviewed  WET  PREP, GENITAL - Abnormal; Notable for the following components:      Result Value   Clue Cells Wet Prep HPF POC PRESENT (*)    WBC, Wet Prep HPF POC MANY (*)    All other components within normal limits  URINALYSIS, ROUTINE W REFLEX MICROSCOPIC -  Abnormal; Notable for the following components:   Hgb urine dipstick SMALL (*)    All other components within normal limits  URINALYSIS, MICROSCOPIC (REFLEX) - Abnormal; Notable for the following components:   Bacteria, UA MANY (*)    All other components within normal limits  PREGNANCY, URINE  GC/CHLAMYDIA PROBE AMP ([place]) NOT AT [facility]    EKG None  Radiology No results found.  Procedures Procedures (including critical care time)  Medications Ordered in ED Medications  ketorolac (TORADOL) 15 MG/ML injection 15 mg (15 mg Intramuscular Given 06/29/19 1445)  cefTRIAXone (ROCEPHIN) injection 500 mg (500 mg Intramuscular Given 06/29/19 1445)  doxycycline (VIBRA-TABS) tablet 100 mg (100 mg Oral Given 06/29/19 1445)    ED Course  I have reviewed the triage vital signs and the nursing notes.  Pertinent labs & imaging results that were available during my care of the patient were reviewed by me and considered in my medical decision making (see chart for details).    MDM Rules/Calculators/A&P                      29 yo F with a chief complaints of pelvic pain and vaginal discharge.  Going on for about 48 hours.  We will perform the pelvic exam.  UA viewed by me without infection.  Pelvic exam is concerning for PID.  Will start on antibiotics.  OB/GYN follow-up.  Also with BV.  2:51 PM:  I have discussed the diagnosis/risks/treatment options with the patient and believe the pt to be eligible for discharge home to follow-up with PCP. We also discussed returning to the ED immediately if new or worsening sx occur. We discussed the sx which are most concerning (e.g., sudden worsening pain, fever, inability to tolerate by mouth) that  necessitate immediate return. Medications administered to the patient during their visit and any new prescriptions provided to the patient are listed below.  Medications given during this visit Medications  ketorolac (TORADOL) 15 MG/ML injection 15 mg (15 mg Intramuscular Given 06/29/19 1445)  cefTRIAXone (ROCEPHIN) injection 500 mg (500 mg Intramuscular Given 06/29/19 1445)  doxycycline (VIBRA-TABS) tablet 100 mg (100 mg Oral Given 06/29/19 1445)     The patient appears reasonably screen and/or stabilized for discharge and I doubt any other medical condition or other [facility] requiring further screening, evaluation, or treatment in the ED at this time prior to discharge.   Final Clinical Impression(s) / ED Diagnoses Final diagnoses:  Cervicitis  BV (bacterial vaginosis)    Rx / DC Orders ED Discharge Orders         Ordered    doxycycline (VIBRAMYCIN) 100 MG capsule  2 times daily     06/29/19 1432    metroNIDAZOLE (FLAGYL) 500 MG tablet  2 times daily     06/29/19 1450           [place], DO 06/29/19 1451

## 2019-06-29 NOTE — Discharge Instructions (Addendum)
Abstain from sexual contact until at least 1 week after you have finished your antibiotic therapy.  If you are concerned about your fertility please contact OB/GYN office to be evaluated in the office.  Take 4 over the counter ibuprofen tablets 3 times a day or 2 over-the-counter naproxen tablets twice a day for pain. Also take tylenol 1000mg (2 extra strength) four times a day.

## 2019-07-01 LAB — GC/CHLAMYDIA PROBE AMP (~~LOC~~) NOT AT ARMC
Chlamydia: NEGATIVE
Comment: NEGATIVE
Comment: NORMAL
Neisseria Gonorrhea: NEGATIVE

## 2019-08-20 ENCOUNTER — Emergency Department (HOSPITAL_COMMUNITY)
Admission: EM | Admit: 2019-08-20 | Discharge: 2019-08-20 | Disposition: A | Discharge status: Home / Self Care | Payer: Medicaid - Out of State | Attending: Emergency Medicine | Admitting: Emergency Medicine

## 2019-08-20 ENCOUNTER — Encounter (HOSPITAL_COMMUNITY): Payer: Self-pay

## 2019-08-20 ENCOUNTER — Other Ambulatory Visit: Payer: Self-pay

## 2019-08-20 DIAGNOSIS — Z79899 Other long term (current) drug therapy: Secondary | ICD-10-CM | POA: Insufficient documentation

## 2019-08-20 DIAGNOSIS — H5712 Ocular pain, left eye: Secondary | ICD-10-CM | POA: Insufficient documentation

## 2019-08-20 DIAGNOSIS — H538 Other visual disturbances: Secondary | ICD-10-CM | POA: Insufficient documentation

## 2019-08-20 DIAGNOSIS — F17290 Nicotine dependence, other tobacco product, uncomplicated: Secondary | ICD-10-CM | POA: Insufficient documentation

## 2019-08-20 DIAGNOSIS — I10 Essential (primary) hypertension: Secondary | ICD-10-CM | POA: Insufficient documentation

## 2019-08-20 MED ORDER — TETRACAINE HCL 0.5 % OP SOLN
2.0000 [drp] | Freq: Once | OPHTHALMIC | Status: AC
  Administered 2019-08-20: 2 [drp] via OPHTHALMIC
  Filled 2019-08-20: qty 4

## 2019-08-20 MED ORDER — AMLODIPINE BESYLATE 10 MG PO TABS: 10.0000 mg | ORAL_TABLET | Freq: Every day | ORAL | 1 refills | Status: DC

## 2019-08-20 NOTE — Discharge Instructions (Signed)
1.  Restart your amlodipine. 2.  It is very important that you have a family doctor to monitor and manage your hypertension as well as any other medical problems.  Untreated hypertension can lead to severe problem such as strokes, heart attacks and kidney failure.  Hypertension also damages your eyes and vision. 3.  You need to be evaluated by an ophthalmologist in follow-up to this visit to the emergency department.  Dr. Charlotte Sanes is the ophthalmologist on-call today.  You can schedule a follow-up appointment with her.  The pressure of your eyes is borderline elevated.  This could be symptoms of early glaucoma and needs to be carefully monitored.  Failure to do so could result in vision loss.

## 2019-08-20 NOTE — ED Provider Notes (Signed)
[facility]-EMERGENCY DEPT Provider Note   CSN: [id number] Arrival date & time: 08/20/19  9150     History Chief Complaint  Patient presents with   Eye Pain    Sharon Johnston is a 29 y.o. female.  HPI Patient reports about 30 minutes before she called EMS she noticed that she had blurred vision in her left eye and some eye pain.  She reports it has improved now.  She denies her was any loss of vision.  Patient reports that she was not asleep this morning she indicates she has been up maybe all night.  Symptoms started suddenly.  They have improved spontaneously.  She was not having any symptoms yesterday.  No generalized headache.  Eye pain mild.  No drainage.  Patient denies any use of glasses or contacts.  Patient reports she also would like a refill of her blood pressure medication.  She reports she has not taken any blood pressure medications for many months.  She is not seeing a PCP to manage her hypertension.  She denies she is having recurrent or chronic headaches, chest pain or shortness of breath.    Past Medical History:  Diagnosis Date   Chlamydia    Depression    hospitalized for 2 weeks after suicide att was post partum   Hx of gonorrhea    Hypertension    gestational HTN   Hypertension    Kidney infection     Patient Active Problem List   Diagnosis Date Noted   Insertion of implantable subdermal contraceptive 06/22/2017   Chronic hypertension 10/31/2015   Depression 06/15/2013    Past Surgical History:  Procedure Laterality Date   NO PAST SURGERIES       OB History    Gravida  6   Para  4   Term  4   Preterm  0   AB  2   Living  4     SAB  1   TAB  1   Ectopic  0   Multiple  0   Live Births  4           Family History  Problem Relation Age of Onset   Hypertension Mother    Hypertension Maternal Grandmother    Healthy Father    Hypertension Maternal Aunt    Depression Cousin     Social  History   Tobacco Use   Smoking status: Current Some Day Smoker    Packs/day: 0.25    Types: Cigars   Smokeless tobacco: Never Used   Tobacco comment: two black and milds per day  Substance Use Topics   Alcohol use: No   Drug use: No    Home Medications Prior to Admission medications   Medication Sig Start Date End Date Taking? Authorizing Provider  amLODipine (NORVASC) 10 MG tablet Take 1 tablet (10 mg total) by mouth daily. 11/01/18   Eustace Moore, MD  amLODipine (NORVASC) 10 MG tablet Take 1 tablet (10 mg total) by mouth daily. 08/20/19   Arby Barrette, MD  benzonatate (TESSALON) 100 MG capsule Take 1 capsule (100 mg total) by mouth every 8 (eight) hours. Patient taking differently: Take 100 mg by mouth 3 (three) times daily as needed for cough.  04/22/19   Bast, Gloris Manchester A, NP  cetirizine-pseudoephedrine (ZYRTEC-D) 5-120 MG tablet Take 1 tablet by mouth daily. 04/22/19   Dahlia Byes A, NP  doxycycline (VIBRAMYCIN) 100 MG capsule Take 1 capsule (100 mg total) by  mouth 2 (two) times daily. One po bid x 14 days 06/29/19   Melene Plan, DO  metroNIDAZOLE (FLAGYL) 500 MG tablet Take 1 tablet (500 mg total) by mouth 2 (two) times daily. One po bid x 7 days 06/29/19   Melene Plan, DO  nitrofurantoin, macrocrystal-monohydrate, (MACROBID) 100 MG capsule Take 1 capsule (100 mg total) by mouth 2 (two) times daily. 04/22/19   Dahlia Byes A, NP  ferrous sulfate 325 (65 FE) MG tablet Take 1 tablet (325 mg total) by mouth 2 (two) times daily with a meal. 04/25/17 11/01/18  Pincus Large, DO  medroxyPROGESTERone (DEPO-PROVERA) 150 MG/ML injection Inject 1 mL (150 mg total) into the muscle every 3 (three) months. 07/26/17 04/22/19  Adam Phenix, MD    Allergies    Patient has no known allergies.  Review of Systems   Review of Systems Constitutional: No fever no chills no malaise Cardiovascular: No chest pain no syncope Respiratory: No shortness of breath no cough Physical Exam Updated Vital  Signs BP (!) 142/91 (BP Location: Right Arm)    Pulse 61    Temp 98.7 F (37.1 C) (Oral)    Resp 15    Ht 5\' 3"  (1.6 m)    Wt 59 kg    SpO2 99%    BMI 23.03 kg/m   Physical Exam Constitutional:      Comments: Patient is sleeping as I enter the room.  No distress.  No respiratory distress.  Patient awakens to light stimulus.  HENT:     Head: Normocephalic and atraumatic.     Mouth/Throat:     Mouth: Mucous membranes are moist.     Pharynx: Oropharynx is clear.  Eyes:     Extraocular Movements: Extraocular movements intact.     Conjunctiva/sclera: Conjunctivae normal.     Pupils: Pupils are equal, round, and reactive to light.     Comments: Eyes are symmetric.  Patient is wearing false lashes.  Extraocular motions intact.  Pupils are 3 mm symmetric and responsive.  No significant scleral injection.  Mild swelling to the lateral upper left lid suggestive of early hordeolum.  Mildly tender.  Tono-Pen pressures: Left 20, 21, 20 Right 20, 20  Cardiovascular:     Rate and Rhythm: Normal rate and regular rhythm.  Pulmonary:     Effort: Pulmonary effort is normal.     Breath sounds: Normal breath sounds.  Abdominal:     General: There is no distension.     Palpations: Abdomen is soft.     Tenderness: There is no abdominal tenderness.  Musculoskeletal:        General: No swelling or tenderness. Normal range of motion.     Cervical back: Neck supple.     Right lower leg: No edema.     Left lower leg: No edema.  Skin:    General: Skin is warm and dry.  Neurological:     General: No focal deficit present.     Mental Status: She is oriented to person, place, and time.     Cranial Nerves: No cranial nerve deficit.     Coordination: Coordination normal.     Comments: No visual field deficits  Psychiatric:        Mood and Affect: Mood normal.    Visual acuity documented on chart 20/20 right, left 20/20 near and 20/25 far ED Results / Procedures / Treatments   Labs (all labs ordered  are listed, but only abnormal results are displayed) Labs  Reviewed - No data to display  EKG None  Radiology No results found.  Procedures Procedures (including critical care time)  Medications Ordered in ED Medications  tetracaine (PONTOCAINE) 0.5 % ophthalmic solution 2 drop (2 drops Both Eyes Given by Other 08/20/19 9798)    ED Course  I have reviewed the triage vital signs and the nursing notes.  Pertinent labs & imaging results that were available during my care of the patient were reviewed by me and considered in my medical decision making (see chart for details).    MDM Rules/Calculators/A&P                      Patient presents with complaint of blurred vision in the left eye that has resolved.  This was a brief episode.  There was no visual loss.  No significant headache.  Patient reported mild eye pain.  This is resolved.  Tono-Pen pressures are high normal.  With young age and borderline high normal intraocular pressures, patient is advised that she must follow-up with ophthalmology for close monitoring.  Patient has been noncompliant with management of hypertension.  She had previously been on amlodipine.  No signs of hypertensive emergency at this time.  Patient is given refill for amlodipine and strongly encouraged to start her regular medical care due to risk of sequelae of unmanaged hypertension. Final Clinical Impression(s) / ED Diagnoses Final diagnoses:  Pain of left eye  Essential hypertension    Rx / DC Orders ED Discharge Orders         Ordered    amLODipine (NORVASC) 10 MG tablet  Daily     08/20/19 0848           Charlesetta Shanks, MD 08/20/19 [phone]

## 2019-08-20 NOTE — ED Notes (Signed)
ED Provider at bedside. 

## 2019-08-20 NOTE — ED Triage Notes (Signed)
Pt reports L eye pain. States that it started about 20 mins prior to her calling EMS. Reports that her vision in that eye is blurry and bothered by light. Also reports some light spots. A&Ox4. Hx HTN.

## 2019-09-04 ENCOUNTER — Emergency Department (HOSPITAL_COMMUNITY)
Admission: EM | Admit: 2019-09-04 | Discharge: 2019-09-04 | Disposition: A | Discharge status: Home / Self Care | Payer: Medicaid - Out of State | Attending: Emergency Medicine | Admitting: Emergency Medicine

## 2019-09-04 ENCOUNTER — Encounter (HOSPITAL_COMMUNITY): Payer: Self-pay

## 2019-09-04 DIAGNOSIS — N898 Other specified noninflammatory disorders of vagina: Secondary | ICD-10-CM | POA: Diagnosis present

## 2019-09-04 DIAGNOSIS — I10 Essential (primary) hypertension: Secondary | ICD-10-CM | POA: Insufficient documentation

## 2019-09-04 DIAGNOSIS — N760 Acute vaginitis: Secondary | ICD-10-CM | POA: Diagnosis not present

## 2019-09-04 DIAGNOSIS — F17290 Nicotine dependence, other tobacco product, uncomplicated: Secondary | ICD-10-CM | POA: Insufficient documentation

## 2019-09-04 DIAGNOSIS — B9689 Other specified bacterial agents as the cause of diseases classified elsewhere: Secondary | ICD-10-CM

## 2019-09-04 LAB — WET PREP, GENITAL
Sperm: NONE SEEN
Trich, Wet Prep: NONE SEEN
WBC, Wet Prep HPF POC: NONE SEEN
Yeast Wet Prep HPF POC: NONE SEEN

## 2019-09-04 LAB — URINALYSIS, ROUTINE W REFLEX MICROSCOPIC
Bilirubin Urine: NEGATIVE
Glucose, UA: NEGATIVE mg/dL
Ketones, ur: NEGATIVE mg/dL
Nitrite: NEGATIVE
Protein, ur: 100 mg/dL — AB
Specific Gravity, Urine: 1.026 (ref 1.005–1.030)
WBC, UA: >50 WBC/hpf — ABNORMAL HIGH (ref 0–5)
pH: 7 (ref 5.0–8.0)

## 2019-09-04 LAB — PREGNANCY, URINE: Preg Test, Ur: NEGATIVE

## 2019-09-04 MED ORDER — AZITHROMYCIN 250 MG PO TABS
1000.0000 mg | ORAL_TABLET | Freq: Once | ORAL | Status: AC
  Administered 2019-09-04: 1000 mg via ORAL
  Filled 2019-09-04: qty 4

## 2019-09-04 MED ORDER — STERILE WATER FOR INJECTION IJ SOLN
INTRAMUSCULAR | Status: AC
  Filled 2019-09-04: qty 10

## 2019-09-04 MED ORDER — METRONIDAZOLE 500 MG PO TABS: 500.0000 mg | ORAL_TABLET | Freq: Two times a day (BID) | ORAL | 0 refills | Status: DC

## 2019-09-04 MED ORDER — CEFTRIAXONE SODIUM 250 MG IJ SOLR
250.0000 mg | Freq: Once | INTRAMUSCULAR | Status: AC
  Administered 2019-09-04: 250 mg via INTRAMUSCULAR
  Filled 2019-09-04: qty 250

## 2019-09-04 NOTE — Discharge Instructions (Signed)
Take the Flagyl as directed.  Do not drink alcohol while taking this, it will make you sick. You will be notified if culture results are positive, however you have already been treated. Return here for new concerns.

## 2019-09-04 NOTE — ED Provider Notes (Signed)
[facility]-EMERGENCY DEPT Provider Note   CSN: [id number] Arrival date & time: 09/04/19  0214     History No chief complaint on file.   Sharon Johnston is a 29 y.o. female.  The history is provided by the patient and medical records.   29 year old female with history of multiple STDs, presenting to the ED with lower abdominal discomfort and vaginal discharge.  States symptoms just began last evening.  Discharge is somewhat yellow/green in color, she does report foul odor.  She denies any dysuria or hematuria.  She has had new sexual partner, reports they are having symptoms as well.  She is not had any fever or chills.  No rashes.  Past Medical History:  Diagnosis Date  . Chlamydia   . Depression    hospitalized for 2 weeks after suicide att was post partum  . Hx of gonorrhea   . Hypertension    gestational HTN  . Hypertension   . Kidney infection     Patient Active Problem List   Diagnosis Date Noted  . Insertion of implantable subdermal contraceptive 06/22/2017  . Chronic hypertension 10/31/2015  . Depression 06/15/2013    Past Surgical History:  Procedure Laterality Date  . NO PAST SURGERIES       OB History    Gravida  6   Para  4   Term  4   Preterm  0   AB  2   Living  4     SAB  1   TAB  1   Ectopic  0   Multiple  0   Live Births  4           Family History  Problem Relation Age of Onset  . Hypertension Mother   . Hypertension Maternal Grandmother   . Healthy Father   . Hypertension Maternal Aunt   . Depression Cousin     Social History   Tobacco Use  . Smoking status: Current Some Day Smoker    Packs/day: 0.25    Types: Cigars  . Smokeless tobacco: Never Used  . Tobacco comment: two black and milds per day  Vaping Use  . Vaping Use: Never used  Substance Use Topics  . Alcohol use: No  . Drug use: No    Home Medications Prior to Admission medications   Medication Sig Start Date End Date Taking?  Authorizing Provider  amLODipine (NORVASC) 10 MG tablet Take 1 tablet (10 mg total) by mouth daily. 11/01/18   Eustace Moore, MD  amLODipine (NORVASC) 10 MG tablet Take 1 tablet (10 mg total) by mouth daily. 08/20/19   Arby Barrette, MD  benzonatate (TESSALON) 100 MG capsule Take 1 capsule (100 mg total) by mouth every 8 (eight) hours. Patient taking differently: Take 100 mg by mouth 3 (three) times daily as needed for cough.  04/22/19   Bast, Gloris Manchester A, NP  cetirizine-pseudoephedrine (ZYRTEC-D) 5-120 MG tablet Take 1 tablet by mouth daily. 04/22/19   Dahlia Byes A, NP  doxycycline (VIBRAMYCIN) 100 MG capsule Take 1 capsule (100 mg total) by mouth 2 (two) times daily. One po bid x 14 days 06/29/19   Melene Plan, DO  metroNIDAZOLE (FLAGYL) 500 MG tablet Take 1 tablet (500 mg total) by mouth 2 (two) times daily. One po bid x 7 days 06/29/19   Melene Plan, DO  nitrofurantoin, macrocrystal-monohydrate, (MACROBID) 100 MG capsule Take 1 capsule (100 mg total) by mouth 2 (two) times daily. 04/22/19  Loura Halt A, NP  ferrous sulfate 325 (65 FE) MG tablet Take 1 tablet (325 mg total) by mouth 2 (two) times daily with a meal. 04/25/17 11/01/18  Katheren Shams, DO  medroxyPROGESTERone (DEPO-PROVERA) 150 MG/ML injection Inject 1 mL (150 mg total) into the muscle every 3 (three) months. 07/26/17 04/22/19  Woodroe Mode, MD    Allergies    Patient has no known allergies.  Review of Systems   Review of Systems  Genitourinary: Positive for vaginal discharge.  All other systems reviewed and are negative.   Physical Exam Updated Vital Signs BP (!) 142/116 (BP Location: Right Arm)   Pulse (!) 55   Temp 98.1 F (36.7 C) (Oral)   Resp 18   LMP 08/20/2019   SpO2 100%   Physical Exam Vitals and nursing note reviewed.  Constitutional:      Appearance: She is well-developed.  HENT:     Head: Normocephalic and atraumatic.  Eyes:     Conjunctiva/sclera: Conjunctivae normal.     Pupils: Pupils are equal, round,  and reactive to light.  Cardiovascular:     Rate and Rhythm: Normal rate and regular rhythm.     Heart sounds: Normal heart sounds.  Pulmonary:     Effort: Pulmonary effort is normal. No respiratory distress.     Breath sounds: Normal breath sounds. No rhonchi.  Abdominal:     General: Bowel sounds are normal.     Palpations: Abdomen is soft.     Tenderness: There is no abdominal tenderness. There is no rebound.     Comments: Soft, non-tender  Genitourinary:    Comments: Exam chaperoned by RN Normal female external genitalia, no visible lesions or rashes, scant amount of white vaginal discharge noted, no bleeding, cervical os closed, no adnexal or CMT Musculoskeletal:        General: Normal range of motion.     Cervical back: Normal range of motion.  Skin:    General: Skin is warm and dry.  Neurological:     Mental Status: She is alert and oriented to person, place, and time.     ED Results / Procedures / Treatments   Labs (all labs ordered are listed, but only abnormal results are displayed) Labs Reviewed  WET PREP, GENITAL - Abnormal; Notable for the following components:      Result Value   Clue Cells Wet Prep HPF POC PRESENT (*)    All other components within normal limits  URINALYSIS, ROUTINE W REFLEX MICROSCOPIC - Abnormal; Notable for the following components:   APPearance CLOUDY (*)    Hgb urine dipstick SMALL (*)    Protein, ur 100 (*)    Leukocytes,Ua MODERATE (*)    WBC, UA >50 (*)    Bacteria, UA MANY (*)    All other components within normal limits  PREGNANCY, URINE  GC/CHLAMYDIA PROBE AMP ([place]) NOT AT [facility]    EKG None  Radiology No results found.  Procedures Procedures (including critical care time)  Medications Ordered in ED Medications - No data to display  ED Course  I have reviewed the triage vital signs and the nursing notes.  Pertinent labs & imaging results that were available during my care of the patient were reviewed by me  and considered in my medical decision making (see chart for details).    MDM Rules/Calculators/A&P  29 year old female presenting to the ED with lower abdominal discomfort and vaginal discharge that began yesterday.  She is afebrile and  nontoxic.  Her abdomen is soft and benign.  Pelvic exam was performed, scant amount of thick, white vaginal discharge noted.  There is no adnexal or cervical motion tenderness.  Wet prep is positive for clue cells.  Gc/chl pending.  UA with bacteria, however no nitrites.  Patient without any current urinary symptoms, this may be contamination from vaginal area.  Patient opted for prophylactic treatment of STDs here in the ED, given Rocephin and azithromycin.  We will plan to discharge home with Flagyl for BV.  She was advised not to drink alcohol while taking this.  She will be notified if her remaining cultures are positive.  She may return here for any new/acute changes.  Final Clinical Impression(s) / ED Diagnoses Final diagnoses:  Bacterial vaginosis    Rx / DC Orders ED Discharge Orders         Ordered    metroNIDAZOLE (FLAGYL) 500 MG tablet  2 times daily     Discontinue  Reprint     09/04/19 0604           Garlon Hatchet, PA-C 09/04/19 0622    Nira Conn, MD 09/04/19 [phone]

## 2019-09-04 NOTE — ED Triage Notes (Signed)
Pt complains of lower abdominal pain and a yellowish colored discharge since this am, denies any vomiting or diarrhea

## 2019-09-05 LAB — GC/CHLAMYDIA PROBE AMP (~~LOC~~) NOT AT ARMC
Chlamydia: NEGATIVE
Comment: NEGATIVE
Comment: NORMAL
Neisseria Gonorrhea: NEGATIVE

## 2019-10-10 ENCOUNTER — Inpatient Hospital Stay (HOSPITAL_COMMUNITY): Payer: Medicaid - Out of State

## 2019-10-10 ENCOUNTER — Encounter (HOSPITAL_COMMUNITY): Payer: Self-pay | Admitting: Obstetrics & Gynecology

## 2019-10-10 ENCOUNTER — Inpatient Hospital Stay (HOSPITAL_COMMUNITY)
Admission: AD | Admit: 2019-10-10 | Discharge: 2019-10-10 | Disposition: A | Discharge status: Home / Self Care | Payer: Medicaid - Out of State | Attending: Obstetrics & Gynecology | Admitting: Obstetrics & Gynecology

## 2019-10-10 ENCOUNTER — Other Ambulatory Visit: Payer: Self-pay

## 2019-10-10 DIAGNOSIS — Z3A01 Less than 8 weeks gestation of pregnancy: Secondary | ICD-10-CM

## 2019-10-10 DIAGNOSIS — Z3491 Encounter for supervision of normal pregnancy, unspecified, first trimester: Secondary | ICD-10-CM

## 2019-10-10 DIAGNOSIS — N8311 Corpus luteum cyst of right ovary: Secondary | ICD-10-CM | POA: Insufficient documentation

## 2019-10-10 DIAGNOSIS — O99331 Smoking (tobacco) complicating pregnancy, first trimester: Secondary | ICD-10-CM | POA: Insufficient documentation

## 2019-10-10 DIAGNOSIS — O10911 Unspecified pre-existing hypertension complicating pregnancy, first trimester: Secondary | ICD-10-CM | POA: Insufficient documentation

## 2019-10-10 DIAGNOSIS — Z8249 Family history of ischemic heart disease and other diseases of the circulatory system: Secondary | ICD-10-CM | POA: Diagnosis not present

## 2019-10-10 DIAGNOSIS — F17210 Nicotine dependence, cigarettes, uncomplicated: Secondary | ICD-10-CM | POA: Insufficient documentation

## 2019-10-10 DIAGNOSIS — R109 Unspecified abdominal pain: Secondary | ICD-10-CM | POA: Diagnosis not present

## 2019-10-10 DIAGNOSIS — O3481 Maternal care for other abnormalities of pelvic organs, first trimester: Secondary | ICD-10-CM | POA: Insufficient documentation

## 2019-10-10 DIAGNOSIS — O26891 Other specified pregnancy related conditions, first trimester: Secondary | ICD-10-CM | POA: Diagnosis present

## 2019-10-10 LAB — URINALYSIS, ROUTINE W REFLEX MICROSCOPIC
Bilirubin Urine: NEGATIVE
Glucose, UA: NEGATIVE mg/dL
Ketones, ur: NEGATIVE mg/dL
Nitrite: NEGATIVE
Protein, ur: NEGATIVE mg/dL
Specific Gravity, Urine: 1.024 (ref 1.005–1.030)
WBC, UA: >50 WBC/hpf — ABNORMAL HIGH (ref 0–5)
pH: 6 (ref 5.0–8.0)

## 2019-10-10 LAB — POCT PREGNANCY, URINE: Preg Test, Ur: POSITIVE — AB

## 2019-10-10 LAB — CBC
HCT: 34 % — ABNORMAL LOW (ref 36.0–46.0)
Hemoglobin: 10.6 g/dL — ABNORMAL LOW (ref 12.0–15.0)
MCH: 27.1 pg (ref 26.0–34.0)
MCHC: 31.2 g/dL (ref 30.0–36.0)
MCV: 87 fL (ref 80.0–100.0)
Platelets: 277 10*3/uL (ref 150–400)
RBC: 3.91 MIL/uL (ref 3.87–5.11)
RDW: 13.8 % (ref 11.5–15.5)
WBC: 3.7 10*3/uL — ABNORMAL LOW (ref 4.0–10.5)
nRBC: 0 % (ref 0.0–0.2)

## 2019-10-10 LAB — WET PREP, GENITAL
Clue Cells Wet Prep HPF POC: NONE SEEN
Sperm: NONE SEEN
Trich, Wet Prep: NONE SEEN
Yeast Wet Prep HPF POC: NONE SEEN

## 2019-10-10 LAB — HCG, QUANTITATIVE, PREGNANCY: hCG, Beta Chain, Quant, S: 11142 m[IU]/mL — ABNORMAL HIGH (ref <5)

## 2019-10-10 NOTE — MAU Provider Note (Signed)
Chief Complaint: Abdominal Pain and Possible Pregnancy   First Provider Initiated Contact with Patient 10/10/19 1650     SUBJECTIVE HPI: Sharon SHANTELL Johnston is a 29 y.o. [id number] at [redacted]w[redacted]d who presents to Maternity Admissions reporting abdominal cramping. Symptoms started this morning. Reports pain throughout lower abdomen that is worse in the right side. Had pink spotting yesterday; no bleeding since then. Denies n/v/d, dysuria, or vaginal discharge.   Location: abdomen Quality: cramping Severity: 5/10 on pain scale Duration: <1 day Timing: intermittent Modifying factors: none; hasn't treated symptoms Associated signs and symptoms: none  Past Medical History:  Diagnosis Date  . Chlamydia   . Depression    hospitalized for 2 weeks after suicide att was post partum  . Hx of gonorrhea   . Hypertension    gestational HTN  . Kidney infection    OB History  Gravida Para Term Preterm AB Living  7 4 4  0 2 4  SAB TAB Ectopic Multiple Live Births  1 1 0 0 4    # Outcome Date GA Lbr Len/2nd Weight Sex Delivery Anes PTL Lv  7 Current           6 Term 04/23/17 [redacted]w[redacted]d 10:04 / 01:24 3410 g [redacted]w[redacted]d EPI  LIV  5 TAB 2017 [redacted]w[redacted]d         4 Term 10/08/14 [redacted]w[redacted]d 02:40 / 00:04 2676 g F Vag-Spont None  LIV  3 Term 08/11/13 [redacted]w[redacted]d 13:48 / 00:08 3280 g F Vag-Spont EPI  LIV  2 Term 10/18/11 [redacted]w[redacted]d 12:30 / 00:30 3402 g M Vag-Spont EPI  LIV  1 SAB 2013 [redacted]w[redacted]d            Birth Comments: System Generated. Please review and update pregnancy details.   Past Surgical History:  Procedure Laterality Date  . NO PAST SURGERIES     Social History   Socioeconomic History  . Marital status: Single    Spouse name: Not on file  . Number of children: Not on file  . Years of education: Not on file  . Highest education level: Not on file  Occupational History  . Not on file  Tobacco Use  . Smoking status: Current Some Day Smoker    Packs/day: 0.25    Types: Cigars  . Smokeless tobacco: Never Used  . Tobacco  comment: two black and milds per day  Vaping Use  . Vaping Use: Never used  Substance and Sexual Activity  . Alcohol use: No  . Drug use: No  . Sexual activity: Yes    Birth control/protection: None  Other Topics Concern  . Not on file  Social History Narrative   ** Merged History Encounter **       Social Determinants of Health   Financial Resource Strain:   . Difficulty of Paying Living Expenses:   Food Insecurity:   . Worried About [redacted]w[redacted]d in the Last Year:   . Programme researcher, broadcasting/film/video in the Last Year:   Transportation Needs:   . Barista (Medical):   Freight forwarder Lack of Transportation (Non-Medical):   Physical Activity:   . Days of Exercise per Week:   . Minutes of Exercise per Session:   Stress:   . Feeling of Stress :   Social Connections:   . Frequency of Communication with Friends and Family:   . Frequency of Social Gatherings with Friends and Family:   . Attends Religious Services:   . Active Member of Clubs or  Organizations:   . Attends BankerClub or Organization Meetings:   Marland Kitchen. Marital Status:   Intimate Partner Violence:   . Fear of Current or Ex-Partner:   . Emotionally Abused:   Marland Kitchen. Physically Abused:   . Sexually Abused:    Family History  Problem Relation Age of Onset  . Hypertension Mother   . Hypertension Maternal Grandmother   . Healthy Father   . Hypertension Maternal Aunt   . Depression Cousin    No current facility-administered medications on file prior to encounter.   Current Outpatient Medications on File Prior to Encounter  Medication Sig Dispense Refill  . [DISCONTINUED] ferrous sulfate 325 (65 FE) MG tablet Take 1 tablet (325 mg total) by mouth 2 (two) times daily with a meal. 60 tablet 0  . [DISCONTINUED] medroxyPROGESTERone (DEPO-PROVERA) 150 MG/ML injection Inject 1 mL (150 mg total) into the muscle every 3 (three) months. 1 mL 0   No Known Allergies  I have reviewed patient's Past Medical Hx, Surgical Hx, Family Hx, Social Hx,  medications and allergies.   Review of Systems  Constitutional: Negative.   Gastrointestinal: Positive for abdominal pain. Negative for constipation, diarrhea, nausea and vomiting.  Genitourinary: Negative.     OBJECTIVE Patient Vitals for the past 24 hrs:  BP Temp Temp src Pulse Resp SpO2 Height Weight  10/10/19 1852 (!) 147/92 -- -- 65 16 -- -- --  10/10/19 1617 (!) 135/94 98.8 F (37.1 C) Oral 84 16 100 % 5\' 3"  (1.6 m) 58 kg   Constitutional: Well-developed, well-nourished female in no acute distress.  Cardiovascular: normal rate & rhythm, no murmur Respiratory: normal rate and effort. Lung sounds clear throughout GI: Abd soft, non-tender, Pos BS x 4. No guarding or rebound tenderness MS: Extremities nontender, no edema, normal ROM Neurologic: Alert and oriented x 4.      LAB RESULTS Results for orders placed or performed during the hospital encounter of 10/10/19 (from the past 24 hour(s))  Pregnancy, urine POC     Status: Abnormal   Collection Time: 10/10/19  4:30 PM  Result Value Ref Range   Preg Test, Ur POSITIVE (A) NEGATIVE  Urinalysis, Routine w reflex microscopic     Status: Abnormal   Collection Time: 10/10/19  4:44 PM  Result Value Ref Range   Color, Urine YELLOW YELLOW   APPearance HAZY (A) CLEAR   Specific Gravity, Urine 1.024 1.005 - 1.030   pH 6.0 5.0 - 8.0   Glucose, UA NEGATIVE NEGATIVE mg/dL   Hgb urine dipstick MODERATE (A) NEGATIVE   Bilirubin Urine NEGATIVE NEGATIVE   Ketones, ur NEGATIVE NEGATIVE mg/dL   Protein, ur NEGATIVE NEGATIVE mg/dL   Nitrite NEGATIVE NEGATIVE   Leukocytes,Ua LARGE (A) NEGATIVE   RBC / HPF 6-10 0 - 5 RBC/hpf   WBC, UA >50 (H) 0 - 5 WBC/hpf   Bacteria, UA RARE (A) NONE SEEN   Squamous Epithelial / LPF 11-20 0 - 5   Mucus PRESENT   CBC     Status: Abnormal   Collection Time: 10/10/19  5:15 PM  Result Value Ref Range   WBC 3.7 (L) 4.0 - 10.5 K/uL   RBC 3.91 3.87 - 5.11 MIL/uL   Hemoglobin 10.6 (L) 12.0 - 15.0 g/dL   HCT  60.434.0 (L) 36 - 46 %   MCV 87.0 80.0 - 100.0 fL   MCH 27.1 26.0 - 34.0 pg   MCHC 31.2 30.0 - 36.0 g/dL   RDW 54.013.8 98.111.5 - 19.115.5 %  Platelets 277 150 - 400 K/uL   nRBC 0.0 0.0 - 0.2 %  hCG, quantitative, pregnancy     Status: Abnormal   Collection Time: 10/10/19  5:15 PM  Result Value Ref Range   hCG, Beta Chain, Quant, S 11,142 (H) <5 mIU/mL  Wet prep, genital     Status: Abnormal   Collection Time: 10/10/19  5:22 PM  Result Value Ref Range   Yeast Wet Prep HPF POC NONE SEEN NONE SEEN   Trich, Wet Prep NONE SEEN NONE SEEN   Clue Cells Wet Prep HPF POC NONE SEEN NONE SEEN   WBC, Wet Prep HPF POC FEW (A) NONE SEEN   Sperm NONE SEEN     IMAGING US OB LESS THAN 14 WEEKS WITH OB TRANSVAGINAL  Result Date: 10/10/2019 CLINICAL DATA:  Abdominal pain, positive UPT EXAM: OBSTETRIC <14 WK [place] AND TRANSVAGINAL OB US TECHNIQUE: Both transabdominal and transvaginal ultrasound examinations were performed for complete evaluation of the gestation as well as the maternal uterus, adnexal regions, and pelvic cul-de-sac. Transvaginal technique was performed to assess early pregnancy. COMPARISON:  Pelvic ultrasound 12/11/2018 FINDINGS: Intrauterine gestational sac: Single Yolk sac:  Visualized. Embryo:  Visualized. Cardiac Activity: Visualized. Heart Rate: 64 bpm MSD: 11.46 mm   5 w   6 d CRL:  1.72 mm, below threshold for accurate dating. Subchorionic hemorrhage:  None visualized. Maternal uterus/adnexae: Normal anteverted uterus. No concerning uterine abnormality. Right ovarian corpus luteum cyst. No concerning adnexal lesions. Trace anechoic free fluid. IMPRESSION: Single intrauterine gestational sac with a diminutive fetal pole, possibly related to early dating. Estimated gestational age of [redacted] weeks 6 days by mean sac diameter given that the crown-rump length is below threshold for accurate sonographic estimation. Mild bradycardia though possibly low given technique related factors in size of the fetal pole.  Consider obstetrical consultation with serial beta HCG and short-term 7 to 10 day interval follow-up ultrasound to reassess viability. Electronically Signed   By: Kreg Shropshire M.D.   On: 10/10/2019 18:21    MAU COURSE Orders Placed This Encounter  Procedures  . Wet prep, genital  . Culture, OB Urine  . US OB LESS THAN 14 WEEKS WITH OB TRANSVAGINAL  . US OB Transvaginal  . Urinalysis, Routine w reflex microscopic  . CBC  . hCG, quantitative, pregnancy  . Pregnancy, urine POC  . Discharge patient   No orders of the defined types were placed in this encounter.   MDM +UPT UA, wet prep, GC/chlamydia, CBC, ABO/Rh, quant hCG, and [place] today to rule out ectopic pregnancy which can be life threatening.   Ultrasound shows live IUP with FHR of 64 bpm. Bradycardia likely due to early gestation; will get f/u ultrasound in 1 week to confirm continued viability.   Wet prep negative. GC/CT pending  ASSESSMENT 1. Normal IUP (intrauterine pregnancy) on prenatal ultrasound, first trimester   2. Abdominal pain during pregnancy in first trimester     PLAN Discharge home in stable condition. SAB precautions Outpatient ultrasound in 1 week to assess fetal bradycardia GC/CT pending    Follow-up Information    [facility] Follow up.   Specialty: Radiology Why: Call [phone] to schedule your ultrasound Contact information: [address] [place] [address] [phone]             Allergies as of 10/10/2019   No Known Allergies     Medication List    STOP taking these medications   amLODipine 10 MG  tablet Commonly known as: NORVASC   benzonatate 100 MG capsule Commonly known as: TESSALON   cetirizine-pseudoephedrine 5-120 MG tablet Commonly known as: ZYRTEC-D   doxycycline 100 MG capsule Commonly known as: VIBRAMYCIN   metroNIDAZOLE 500 MG tablet Commonly known as: FLAGYL   nitrofurantoin (macrocrystal-monohydrate) 100  MG capsule Commonly known as: Raeanne Gathers, Denny Peon, NP 10/10/2019  7:23 PM

## 2019-10-10 NOTE — Discharge Instructions (Signed)
Return to care  °· If you have heavier bleeding that soaks through more that 2 pads per hour for an hour or more °· If you bleed so much that you feel like you might pass out or you do pass out °· If you have significant abdominal pain that is not improved with Tylenol  °· If you develop a fever > 100.5 ° °

## 2019-10-10 NOTE — MAU Note (Signed)
Had some abd pain earlier today in lower stomach. Pt had +home test, reports one line was real faint. A little spotting yesterday.

## 2019-10-11 LAB — GC/CHLAMYDIA PROBE AMP (~~LOC~~) NOT AT ARMC
Chlamydia: NEGATIVE
Comment: NEGATIVE
Comment: NORMAL
Neisseria Gonorrhea: NEGATIVE

## 2019-10-11 LAB — CULTURE, OB URINE

## 2019-10-19 ENCOUNTER — Emergency Department (HOSPITAL_COMMUNITY)
Admission: EM | Admit: 2019-10-19 | Discharge: 2019-10-20 | Disposition: A | Discharge status: Home / Self Care | Payer: Medicaid - Out of State | Attending: Emergency Medicine | Admitting: Emergency Medicine

## 2019-10-19 ENCOUNTER — Encounter (HOSPITAL_COMMUNITY): Payer: Self-pay

## 2019-10-19 DIAGNOSIS — R05 Cough: Secondary | ICD-10-CM | POA: Diagnosis not present

## 2019-10-19 DIAGNOSIS — R432 Parageusia: Secondary | ICD-10-CM | POA: Insufficient documentation

## 2019-10-19 DIAGNOSIS — Z5321 Procedure and treatment not carried out due to patient leaving prior to being seen by health care provider: Secondary | ICD-10-CM | POA: Diagnosis not present

## 2019-10-19 NOTE — ED Triage Notes (Signed)
Pt arrives to ED w/ c/o loss of sense of taste and cough. Pt denies fever, covid exposure. Resp e/u.

## 2019-10-20 NOTE — ED Notes (Signed)
Pt name called multiple times no answer  

## 2019-11-05 ENCOUNTER — Inpatient Hospital Stay (HOSPITAL_COMMUNITY): Payer: Medicaid - Out of State

## 2019-11-05 ENCOUNTER — Other Ambulatory Visit: Payer: Self-pay

## 2019-11-05 ENCOUNTER — Encounter (HOSPITAL_COMMUNITY): Payer: Self-pay | Admitting: Obstetrics and Gynecology

## 2019-11-05 ENCOUNTER — Inpatient Hospital Stay (HOSPITAL_COMMUNITY)
Admission: AD | Admit: 2019-11-05 | Discharge: 2019-11-05 | Disposition: A | Discharge status: Home / Self Care | Payer: Medicaid - Out of State | Attending: Obstetrics and Gynecology | Admitting: Obstetrics and Gynecology

## 2019-11-05 DIAGNOSIS — F17210 Nicotine dependence, cigarettes, uncomplicated: Secondary | ICD-10-CM | POA: Diagnosis not present

## 2019-11-05 DIAGNOSIS — O99331 Smoking (tobacco) complicating pregnancy, first trimester: Secondary | ICD-10-CM | POA: Insufficient documentation

## 2019-11-05 DIAGNOSIS — R109 Unspecified abdominal pain: Secondary | ICD-10-CM | POA: Diagnosis not present

## 2019-11-05 DIAGNOSIS — Z6790 Unspecified blood type, Rh positive: Secondary | ICD-10-CM | POA: Insufficient documentation

## 2019-11-05 DIAGNOSIS — Z3A08 8 weeks gestation of pregnancy: Secondary | ICD-10-CM | POA: Diagnosis not present

## 2019-11-05 DIAGNOSIS — O4690 Antepartum hemorrhage, unspecified, unspecified trimester: Secondary | ICD-10-CM | POA: Diagnosis not present

## 2019-11-05 DIAGNOSIS — R102 Pelvic and perineal pain: Secondary | ICD-10-CM | POA: Insufficient documentation

## 2019-11-05 DIAGNOSIS — O039 Complete or unspecified spontaneous abortion without complication: Secondary | ICD-10-CM

## 2019-11-05 LAB — COMPREHENSIVE METABOLIC PANEL
ALT: 10 U/L (ref 0–44)
AST: 11 U/L — ABNORMAL LOW (ref 15–41)
Albumin: 3.7 g/dL (ref 3.5–5.0)
Alkaline Phosphatase: 34 U/L — ABNORMAL LOW (ref 38–126)
Anion gap: 6 (ref 5–15)
BUN: 12 mg/dL (ref 6–20)
CO2: 25 mmol/L (ref 22–32)
Calcium: 8.7 mg/dL — ABNORMAL LOW (ref 8.9–10.3)
Chloride: 108 mmol/L (ref 98–111)
Creatinine, Ser: 0.8 mg/dL (ref 0.44–1.00)
GFR calc Af Amer: >60 mL/min (ref >60)
GFR calc non Af Amer: >60 mL/min (ref >60)
Glucose, Bld: 91 mg/dL (ref 70–99)
Potassium: 3.5 mmol/L (ref 3.5–5.1)
Sodium: 139 mmol/L (ref 135–145)
Total Bilirubin: 1.1 mg/dL (ref 0.3–1.2)
Total Protein: 6.7 g/dL (ref 6.5–8.1)

## 2019-11-05 LAB — URINALYSIS, ROUTINE W REFLEX MICROSCOPIC
Bilirubin Urine: NEGATIVE
Glucose, UA: NEGATIVE mg/dL
Ketones, ur: NEGATIVE mg/dL
Leukocytes,Ua: NEGATIVE
Nitrite: POSITIVE — AB
Protein, ur: 30 mg/dL — AB
Specific Gravity, Urine: 1.03 (ref 1.005–1.030)
pH: 6 (ref 5.0–8.0)

## 2019-11-05 LAB — CBC
HCT: 33.7 % — ABNORMAL LOW (ref 36.0–46.0)
Hemoglobin: 10.5 g/dL — ABNORMAL LOW (ref 12.0–15.0)
MCH: 27.2 pg (ref 26.0–34.0)
MCHC: 31.2 g/dL (ref 30.0–36.0)
MCV: 87.3 fL (ref 80.0–100.0)
Platelets: 286 10*3/uL (ref 150–400)
RBC: 3.86 MIL/uL — ABNORMAL LOW (ref 3.87–5.11)
RDW: 13.3 % (ref 11.5–15.5)
WBC: 4.1 10*3/uL (ref 4.0–10.5)
nRBC: 0 % (ref 0.0–0.2)

## 2019-11-05 LAB — HCG, QUANTITATIVE, PREGNANCY: hCG, Beta Chain, Quant, S: 7241 m[IU]/mL — ABNORMAL HIGH (ref <5)

## 2019-11-05 MED ORDER — PROMETHAZINE HCL 12.5 MG PO TABS
12.5000 mg | ORAL_TABLET | Freq: Four times a day (QID) | ORAL | 0 refills | Status: DC | PRN
Start: 2019-11-05 — End: 2020-04-24

## 2019-11-05 MED ORDER — IBUPROFEN 600 MG PO TABS
600.0000 mg | ORAL_TABLET | Freq: Four times a day (QID) | ORAL | 0 refills | Status: DC | PRN
Start: 2019-11-05 — End: 2020-01-18

## 2019-11-05 MED ORDER — OXYCODONE HCL 5 MG PO TABS: 5.0000 mg | ORAL_TABLET | Freq: Three times a day (TID) | ORAL | 0 refills | Status: DC | PRN

## 2019-11-05 NOTE — MAU Note (Signed)
Presents with c/o VB that began yesterday but has increased today, reports "ike a period'.  States has passed few small blood clots.  Reports abdominal cramping.

## 2019-11-05 NOTE — MAU Provider Note (Signed)
History     CSN: [id number]  Arrival date and time: 11/05/19 1637   First Provider Initiated Contact with Patient 11/05/19 1924      Chief Complaint  Patient presents with  . Vaginal Bleeding   [name] is a 29 y.o. [id number] at [redacted]w[redacted]d who presents to MAU for vaginal bleeding which began yesterday. Patient reports yesterday the bleeding was only spotting and today it is "like a period." Patient reports it is a little heavier than a normal period with very small clots. Patient reports using 3 pads in the past 24 hours. Patient was seen in MAU for bleeding on 10/10/2019 for bleeding and found to have an IUP with bradycardia. Patient did not have f/u US after that.  Passing blood clots? Per above Blood soaking clothes? no Lightheaded/dizzy? no Significant pelvic pain or cramping? Period-like cramping Passed any tissue? no  Current pregnancy problems? Pt has not yet been seen Blood Type? A Positive Allergies? NKDA Current medications? none Current PNC & next appt? none  Pt denies vaginal discharge/odor/itching. Pt denies N/V, abdominal pain, constipation, diarrhea, or urinary problems. Pt denies fever, chills, fatigue, sweating or changes in appetite. Pt denies SOB or chest pain. Pt denies dizziness, HA, light-headedness, weakness.   OB History    Gravida  7   Para  4   Term  4   Preterm  0   AB  2   Living  4     SAB  1   TAB  1   Ectopic  0   Multiple  0   Live Births  4           Past Medical History:  Diagnosis Date  . Chlamydia   . Depression    hospitalized for 2 weeks after suicide att was post partum  . Hx of gonorrhea   . Hypertension    gestational HTN  . Kidney infection     Past Surgical History:  Procedure Laterality Date  . NO PAST SURGERIES      Family History  Problem Relation Age of Onset  . Hypertension Mother   . Hypertension Maternal Grandmother   . Healthy Father   . Hypertension Maternal Aunt   . Depression  Cousin     Social History   Tobacco Use  . Smoking status: Current Some Day Smoker    Packs/day: 0.25    Types: Cigars  . Smokeless tobacco: Never Used  . Tobacco comment: two black and milds per day  Vaping Use  . Vaping Use: Never used  Substance Use Topics  . Alcohol use: No  . Drug use: No    Allergies: No Known Allergies  No medications prior to admission.    Review of Systems  Constitutional: Negative for chills, diaphoresis, fatigue and fever.  Eyes: Negative for visual disturbance.  Respiratory: Negative for shortness of breath.   Cardiovascular: Negative for chest pain.  Gastrointestinal: Negative for abdominal pain, constipation, diarrhea, nausea and vomiting.  Genitourinary: Positive for pelvic pain (pt endorses cramping) and vaginal bleeding. Negative for dysuria, flank pain, frequency, urgency and vaginal discharge.  Neurological: Negative for dizziness, weakness, light-headedness and headaches.   Physical Exam   Blood pressure (!) 155/100, pulse (!) 55, temperature 98.3 F (36.8 C), resp. rate 16, height  (1.575 m), weight 57.1 kg, last menstrual period 09/04/2019, SpO2 100 %.  Patient Vitals for the past 24 hrs:  BP Temp Temp src Pulse Resp SpO2 Height Weight  11/05/19  2046 (!) 155/100 -- -- (!) 55 -- -- -- --  11/05/19 2030 (!) 168/88 -- -- (!) 57 -- -- -- --  11/05/19 2028 -- 98.3 F (36.8 C) -- -- 16 100 % -- --  11/05/19 1930 (!) 141/86 -- -- (!) 51 -- -- -- --  11/05/19 1914 (!) 141/84 -- -- (!) 53 -- -- -- --  11/05/19 1726 140/83 99.1 F (37.3 C) Oral 71 18 100 % -- --  11/05/19 1718 -- -- -- -- -- -- 5\' 2"  (1.575 m) 57.1 kg   Physical Exam Vitals and nursing note reviewed.  Constitutional:      Appearance: Normal appearance.  HENT:     Head: Normocephalic and atraumatic.  Pulmonary:     Effort: Pulmonary effort is normal.  Neurological:     Mental Status: She is alert and oriented to person, place, and time.  Psychiatric:         Mood and Affect: Mood normal.        Behavior: Behavior normal.        Thought Content: Thought content normal.        Judgment: Judgment normal.    Results for orders placed or performed during the hospital encounter of 11/05/19 (from the past 24 hour(s))  Urinalysis, Routine w reflex microscopic Urine, Clean Catch     Status: Abnormal   Collection Time: 11/05/19  5:39 PM  Result Value Ref Range   Color, Urine AMBER (A) YELLOW   APPearance HAZY (A) CLEAR   Specific Gravity, Urine 1.030 1.005 - 1.030   pH 6.0 5.0 - 8.0   Glucose, UA NEGATIVE NEGATIVE mg/dL   Hgb urine dipstick MODERATE (A) NEGATIVE   Bilirubin Urine NEGATIVE NEGATIVE   Ketones, ur NEGATIVE NEGATIVE mg/dL   Protein, ur 30 (A) NEGATIVE mg/dL   Nitrite POSITIVE (A) NEGATIVE   Leukocytes,Ua NEGATIVE NEGATIVE   RBC / HPF 6-10 0 - 5 RBC/hpf   WBC, UA 0-5 0 - 5 WBC/hpf   Bacteria, UA FEW (A) NONE SEEN   Squamous Epithelial / LPF 0-5 0 - 5   Mucus PRESENT   CBC     Status: Abnormal   Collection Time: 11/05/19  6:35 PM  Result Value Ref Range   WBC 4.1 4.0 - 10.5 K/uL   RBC 3.86 (L) 3.87 - 5.11 MIL/uL   Hemoglobin 10.5 (L) 12.0 - 15.0 g/dL   HCT 11/07/19 (L) 36 - 46 %   MCV 87.3 80.0 - 100.0 fL   MCH 27.2 26.0 - 34.0 pg   MCHC 31.2 30.0 - 36.0 g/dL   RDW 60.4 54.0 - 98.1 %   Platelets 286 150 - 400 K/uL   nRBC 0.0 0.0 - 0.2 %  Comprehensive metabolic panel     Status: Abnormal   Collection Time: 11/05/19  6:35 PM  Result Value Ref Range   Sodium 139 135 - 145 mmol/L   Potassium 3.5 3.5 - 5.1 mmol/L   Chloride 108 98 - 111 mmol/L   CO2 25 22 - 32 mmol/L   Glucose, Bld 91 70 - 99 mg/dL   BUN 12 6 - 20 mg/dL   Creatinine, Ser 11/07/19 0.44 - 1.00 mg/dL   Calcium 8.7 (L) 8.9 - 10.3 mg/dL   Total Protein 6.7 6.5 - 8.1 g/dL   Albumin 3.7 3.5 - 5.0 g/dL   AST 11 (L) 15 - 41 U/L   ALT 10 0 - 44 U/L   Alkaline Phosphatase 34 (L)  38 - 126 U/L   Total Bilirubin 1.1 0.3 - 1.2 mg/dL   GFR calc non Af Amer >60 >60 mL/min    GFR calc Af Amer >60 >60 mL/min   Anion gap 6 5 - 15  hCG, quantitative, pregnancy     Status: Abnormal   Collection Time: 11/05/19  6:35 PM  Result Value Ref Range   hCG, Beta Chain, Quant, S 7,241 (H) <5 mIU/mL    US OB Transvaginal  Result Date: 11/05/2019 CLINICAL DATA:  Vaginal bleeding. EXAM: OBSTETRIC <14 WK [place] AND TRANSVAGINAL OB US TECHNIQUE: Both transabdominal and transvaginal ultrasound examinations were performed for complete evaluation of the gestation as well as the maternal uterus, adnexal regions, and pelvic cul-de-sac. Transvaginal technique was performed to assess early pregnancy. COMPARISON:  October 10, 2019 FINDINGS: Intrauterine gestational sac: Single Yolk sac:  Visualized (abnormal in appearance). Embryo:  Visualized. Cardiac Activity: Not Visualized. Heart Rate: N/A  bpm CRL:  3.5 mm   6 w   0 d                  [place] EDC: June 30, 2020 Subchorionic hemorrhage:  A small subchorionic hemorrhage is seen. Maternal uterus/adnexae: The bilateral ovaries are visualized and are normal in appearance. No pelvic free fluid is noted. IMPRESSION: Single intrauterine pregnancy at approximately 6 weeks and 0 days gestation without evidence of fetal heart motion. Correlation with follow-up pelvic ultrasound is recommended. Electronically Signed   By: Aram Candela M.D.   On: 11/05/2019 19:23   US OB LESS THAN 14 WEEKS WITH OB TRANSVAGINAL  Result Date: 10/10/2019 CLINICAL DATA:  Abdominal pain, positive UPT EXAM: OBSTETRIC <14 WK [place] AND TRANSVAGINAL OB US TECHNIQUE: Both transabdominal and transvaginal ultrasound examinations were performed for complete evaluation of the gestation as well as the maternal uterus, adnexal regions, and pelvic cul-de-sac. Transvaginal technique was performed to assess early pregnancy. COMPARISON:  Pelvic ultrasound 12/11/2018 FINDINGS: Intrauterine gestational sac: Single Yolk sac:  Visualized. Embryo:  Visualized. Cardiac Activity: Visualized. Heart Rate: 64 bpm  MSD: 11.46 mm   5 w   6 d CRL:  1.72 mm, below threshold for accurate dating. Subchorionic hemorrhage:  None visualized. Maternal uterus/adnexae: Normal anteverted uterus. No concerning uterine abnormality. Right ovarian corpus luteum cyst. No concerning adnexal lesions. Trace anechoic free fluid. IMPRESSION: Single intrauterine gestational sac with a diminutive fetal pole, possibly related to early dating. Estimated gestational age of [redacted] weeks 6 days by mean sac diameter given that the crown-rump length is below threshold for accurate sonographic estimation. Mild bradycardia though possibly low given technique related factors in size of the fetal pole. Consider obstetrical consultation with serial beta HCG and short-term 7 to 10 day interval follow-up ultrasound to reassess viability. Electronically Signed   By: Kreg Shropshire M.D.   On: 10/10/2019 18:21    MAU Course  Procedures  MDM -VB and cramping in setting of known IUP with previously identified fetal bradycardia -UA: amber/hazy/mod hgb/30PRO/+nitrite/few bacteria, sending urine for culture -CBC: H/H 10.5/33.7 -CMP: no abnormalities requiring treatment -hCG: 7,241 (was 11,142 3 weeks ago) -[place]: abnormal yolk sac, embryo visualized, no cardiac activity, sm SCH, per radiology recommends f/u US. Called and spoke with Dr. Londell Moh, provided information about falling hCG, per Dr. Londell Moh, recommendation to repeat US remains the same. -RH positive -consulted with Dr. Adrian Blackwater, given information can diagnose as a failed pregnancy -discussed options of cytotec, expectant management and surgical management, pt elects expectant management as she has had a  miscarriage in the past and would like to pass it at home -per Dr. Adrian BlackwaterStinson, pt OK to be discharged with elevated blood pressures -pt discharged to home in stable condition  Orders Placed This Encounter  Procedures  . Culture, OB Urine    Standing Status:   Standing    Number of Occurrences:   1  . US  OB Transvaginal    Standing Status:   Standing    Number of Occurrences:   1    Order Specific Question:   Symptom/Reason for Exam    Answer:   Vaginal bleeding in pregnancy [705036]  . Urinalysis, Routine w reflex microscopic Urine, Clean Catch    Standing Status:   Standing    Number of Occurrences:   1  . CBC    Standing Status:   Standing    Number of Occurrences:   1  . Comprehensive metabolic panel    Standing Status:   Standing    Number of Occurrences:   1  . hCG, quantitative, pregnancy    Standing Status:   Standing    Number of Occurrences:   1  . Discharge patient    Order Specific Question:   Discharge disposition    Answer:   01-Home or Self Care [1]    Order Specific Question:   Discharge patient date    Answer:   11/05/2019    Meds ordered this encounter  Medications  . ibuprofen (ADVIL) 600 MG tablet    Sig: Take 1 tablet (600 mg total) by mouth every 6 (six) hours as needed for up to 30 doses for moderate pain or cramping.    Dispense:  30 tablet    Refill:  0    Order Specific Question:   Supervising Provider    Answer:   Levie HeritageSTINSON, JACOB J [[id number]]  . oxyCODONE (ROXICODONE) 5 MG immediate release tablet    Sig: Take 1 tablet (5 mg total) by mouth every 8 (eight) hours as needed for breakthrough pain.    Dispense:  8 tablet    Refill:  0    Order Specific Question:   Supervising Provider    Answer:   Levie HeritageSTINSON, JACOB J [[id number]]  . promethazine (PHENERGAN) 12.5 MG tablet    Sig: Take 1 tablet (12.5 mg total) by mouth every 6 (six) hours as needed for nausea or vomiting.    Dispense:  30 tablet    Refill:  0    Order Specific Question:   Supervising Provider    Answer:   Levie HeritageSTINSON, JACOB J [[id number]]    Assessment and Plan   1. Miscarriage   2. Vaginal bleeding in pregnancy   3. Blood type, Rh positive     Allergies as of 11/05/2019   No Known Allergies     Medication List    TAKE these medications   ibuprofen 600 MG tablet Commonly known as: ADVIL Take  1 tablet (600 mg total) by mouth every 6 (six) hours as needed for up to 30 doses for moderate pain or cramping.   oxyCODONE 5 MG immediate release tablet Commonly known as: Roxicodone Take 1 tablet (5 mg total) by mouth every 8 (eight) hours as needed for breakthrough pain.   promethazine 12.5 MG tablet Commonly known as: PHENERGAN Take 1 tablet (12.5 mg total) by mouth every 6 (six) hours as needed for nausea or vomiting.       -will call with culture results, if positive -discussed starting ABX now with +  nitrites but without UTI symptoms, and patient reports she prefers to wait for results of urine culture, prior to starting ABX -pt reports she is OK with miscarriage as she "did not want this pregnancy" -discussed expected s/sx of SAB and warning signs requiring return to MAU -RX ibuprofen 600 mg 1 tablet by mouth every 6 hours as needed -RX oxycodone 5mg  1 tablet every 8 hours as needed -discussed alternating use of Tylenol and ibuprofen q3hours with oxycodone for breakthrough pain -RX Phenergan 12.5 mg by mouth every 6 hours as needed for nausea -message sent to [facility] to schedule f/u hCG in one week and f/u HCG plus provider visit in two weeks -return MAU precautions given -pt discharged to home in stable condition  E Cartez Mogle 11/05/2019, 8:49 PM

## 2019-11-05 NOTE — Discharge Instructions (Signed)
Miscarriage A miscarriage is the loss of an unborn baby (fetus) before the 20th week of pregnancy. Most miscarriages happen during the first 3 months of pregnancy. Sometimes, a miscarriage can happen before a woman knows that she is pregnant. Having a miscarriage can be an emotional experience. If you have had a miscarriage, talk with your health care provider about any questions you may have about miscarrying, the grieving process, and your plans for future pregnancy. What are the causes? A miscarriage may be caused by:  Problems with the genes or chromosomes of the fetus. These problems make it impossible for the baby to develop normally. They are often the result of random errors that occur early in the development of the baby, and are not passed from parent to child (not inherited).  Infection of the cervix or uterus.  Conditions that affect hormone balance in the body.  Problems with the cervix, such as the cervix opening and thinning before pregnancy is at term (cervical insufficiency).  Problems with the uterus. These may include: ? A uterus with an abnormal shape. ? Fibroids in the uterus. ? Congenital abnormalities. These are problems that were present at birth.  Certain medical conditions.  Smoking, drinking alcohol, or using drugs.  Injury (trauma). In many cases, the cause of a miscarriage is not known. What are the signs or symptoms? Symptoms of this condition include:  Vaginal bleeding or spotting, with or without cramps or pain.  Pain or cramping in the abdomen or lower back.  Passing fluid, tissue, or blood clots from the vagina. How is this diagnosed? This condition may be diagnosed based on:  A physical exam.  Ultrasound.  Blood tests.  Urine tests. How is this treated? Treatment for a miscarriage is sometimes not necessary if you naturally pass all the tissue that was in your uterus. If necessary, this condition may be treated with:  Dilation and  curettage (D&C). This is a procedure in which the cervix is stretched open and the lining of the uterus (endometrium) is scraped. This is done only if tissue from the fetus or placenta remains in the body (incomplete miscarriage).  Medicines, such as: ? Antibiotic medicine, to treat infection. ? Medicine to help the body pass any remaining tissue. ? Medicine to reduce (contract) the size of the uterus. These medicines may be given if you have a lot of bleeding. If you have Rh negative blood and your baby was Rh positive, you will need a shot of a medicine called Rh immunoglobulinto protect your future babies from Rh blood problems. "Rh-negative" and "Rh-positive" refer to whether or not the blood has a specific protein found on the surface of red blood cells (Rh factor). Follow these instructions at home: Medicines   Take over-the-counter and prescription medicines only as told by your health care provider.  If you were prescribed antibiotic medicine, take it as told by your health care provider. Do not stop taking the antibiotic even if you start to feel better.  Do not take NSAIDs, such as aspirin and ibuprofen, unless they are approved by your health care provider. These medicines can cause bleeding. Activity  Rest as directed. Ask your health care provider what activities are safe for you.  Have someone help with home and family responsibilities during this time. General instructions  Keep track of the number of sanitary pads you use each day and how soaked (saturated) they are. Write down this information.  Monitor the amount of tissue or blood clots that   you pass from your vagina. Save any large amounts of tissue for your health care provider to examine.  Do not use tampons, douche, or have sex until your health care provider approves.  To help you and your partner with the process of grieving, talk with your health care provider or seek counseling.  When you are ready, meet with  your health care provider to discuss any important steps you should take for your health. Also, discuss steps you should take to have a healthy pregnancy in the future.  Keep all follow-up visits as told by your health care provider. This is important. Where to find more information  The American Congress of Obstetricians and Gynecologists: www.acog.org  [place] Department of Health and Human Services Office of Women's Health: www.womenshealth.gov Contact a health care provider if:  You have a fever or chills.  You have a foul smelling vaginal discharge.  You have more bleeding instead of less. Get help right away if:  You have severe cramps or pain in your back or abdomen.  You pass blood clots or tissue from your vagina that is walnut-sized or larger.  You soak more than 1 regular sanitary pad in an hour.  You become light-headed or weak.  You pass out.  You have feelings of sadness that take over your thoughts, or you have thoughts of hurting yourself. Summary  Most miscarriages happen in the first 3 months of pregnancy. Sometimes miscarriage happens before a woman even knows that she is pregnant.  Follow your health care provider's instruction for home care. Keep all follow-up appointments.  To help you and your partner with the process of grieving, talk with your health care provider or seek counseling. This information is not intended to replace advice given to you by your health care provider. Make sure you discuss any questions you have with your health care provider. Document Revised: 06/29/2018 Document Reviewed: 04/12/2016 Elsevier Patient Education  2020 Elsevier Inc.   Managing Pregnancy Loss Pregnancy loss can happen any time during a pregnancy. Often the cause is not known. It is rarely because of anything you did. Pregnancy loss in early pregnancy (during the first trimester) is called a miscarriage. This type of pregnancy loss is the most common. Pregnancy loss  that happens after 20 weeks of pregnancy is called fetal demise if the baby's heart stops beating before birth. Fetal demise is much less common. Some women experience spontaneous labor shortly after fetal demise resulting in a stillborn birth (stillbirth). Any pregnancy loss can be devastating. You will need to recover both physically and emotionally. Most women are able to get pregnant again after a pregnancy loss and deliver a healthy baby. How to manage emotional recovery  Pregnancy loss is very hard emotionally. You may feel many different emotions while you grieve. You may feel sad and angry. You may also feel guilty. It is normal to have periods of crying. Emotional recovery can take longer than physical recovery. It is different for everyone. Taking these steps can help you in managing this loss:  Remember that it is unlikely you did anything to cause the pregnancy loss.  Share your thoughts and feelings with friends, family, and your partner. Remember that your partner is also recovering emotionally.  Make sure you have a good support system. Do not spend too much time alone.  Meet with a pregnancy loss counselor or join a pregnancy loss support group.  Get enough sleep and eat a healthy diet. Return to regular exercise   when you have recovered physically.  Do not use drugs or alcohol to manage your emotions.  Consider seeing a mental health professional to help you recover emotionally.  Ask a friend or loved one to help you decide what to do with any clothing and nursery items you received for your baby. In the case of a stillbirth, many women benefit from taking additional steps in the grieving process. You may want to:  Hold your baby after the birth.  Name your baby.  Request a birth certificate.  Create a keepsake such as handprints or footprints.  Dress your baby and have a picture taken.  Make funeral arrangements.  Ask for a baptism or blessing. Hospitals have  staff members who can help you with all these arrangements. How to recognize emotional stress It is normal to have emotional stress after a pregnancy loss. But emotional stress that lasts a long time or becomes severe requires treatment. Watch out for these signs of severe emotional stress:  Sadness, anger, or guilt that is not going away and is interfering with your normal activities.  Relationship problems that have occurred or gotten worse since the pregnancy loss.  Signs of depression that last longer than 2 weeks. These may include: ? Sadness. ? Anxiety. ? Hopelessness. ? Loss of interest in activities you enjoy. ? Inability to concentrate. ? Trouble sleeping or sleeping too much. ? Loss of appetite or overeating. ? Thoughts of death or of hurting yourself. Follow these instructions at home:  Take over-the-counter and prescription medicines only as told by your health care provider.  Rest at home until your energy level returns. Return to your normal activities as told by your health care provider. Ask your health care provider what activities are safe for you.  When you are ready, meet with your health care provider to discuss steps to take for a future pregnancy.  Keep all follow-up visits as told by your health care provider. This is important. Where to find support  To help you and your partner with the process of grieving, talk with your health care provider or seek counseling.  Consider meeting with others who have experienced pregnancy loss. Ask your health care provider about support groups and resources. Where to find more information  [place] Department of Health and Human Services Office on Women's Health: www.womenshealth.gov  American Pregnancy Association: www.americanpregnancy.org Contact a health care provider if:  You continue to experience grief, sadness, or lack of motivation for everyday activities, and those feelings do not improve over time.  You are  struggling to recover emotionally, especially if you are using alcohol or substances to help. Get help right away if:  You have thoughts of hurting yourself or others. If you ever feel like you may hurt yourself or others, or have thoughts about taking your own life, get help right away. You can go to your nearest emergency department or call:  Your local emergency services (911 in the [place]  A suicide crisis helpline, such as the National Suicide Prevention Lifeline at 1-800-273-8255. This is open 24 hours a day. Summary  Any pregnancy loss can be difficult physically and emotionally.  You may experience many different emotions while you grieve. Emotional recovery can last longer than physical recovery.  It is normal to have emotional stress after a pregnancy loss. But emotional stress that lasts a long time or becomes severe requires treatment.  See your health care provider if you are struggling emotionally after a pregnancy loss. This information   is not intended to replace advice given to you by your health care provider. Make sure you discuss any questions you have with your health care provider. Document Revised: 06/27/2018 Document Reviewed: 05/18/2017 Elsevier Patient Education  2020 Elsevier Inc.  

## 2019-11-06 ENCOUNTER — Other Ambulatory Visit: Payer: Self-pay

## 2019-11-06 ENCOUNTER — Inpatient Hospital Stay (HOSPITAL_COMMUNITY)
Admission: EM | Admit: 2019-11-06 | Discharge: 2019-11-06 | Disposition: A | Discharge status: Home / Self Care | Payer: Medicaid - Out of State | Attending: Obstetrics and Gynecology | Admitting: Obstetrics and Gynecology

## 2019-11-06 ENCOUNTER — Inpatient Hospital Stay (HOSPITAL_COMMUNITY): Payer: Medicaid - Out of State

## 2019-11-06 DIAGNOSIS — N939 Abnormal uterine and vaginal bleeding, unspecified: Secondary | ICD-10-CM

## 2019-11-06 DIAGNOSIS — F329 Major depressive disorder, single episode, unspecified: Secondary | ICD-10-CM | POA: Diagnosis not present

## 2019-11-06 DIAGNOSIS — Z8249 Family history of ischemic heart disease and other diseases of the circulatory system: Secondary | ICD-10-CM | POA: Diagnosis not present

## 2019-11-06 DIAGNOSIS — D649 Anemia, unspecified: Secondary | ICD-10-CM | POA: Diagnosis not present

## 2019-11-06 DIAGNOSIS — Z791 Long term (current) use of non-steroidal anti-inflammatories (NSAID): Secondary | ICD-10-CM | POA: Diagnosis not present

## 2019-11-06 DIAGNOSIS — Z3A09 9 weeks gestation of pregnancy: Secondary | ICD-10-CM | POA: Insufficient documentation

## 2019-11-06 DIAGNOSIS — O99331 Smoking (tobacco) complicating pregnancy, first trimester: Secondary | ICD-10-CM | POA: Insufficient documentation

## 2019-11-06 DIAGNOSIS — O161 Unspecified maternal hypertension, first trimester: Secondary | ICD-10-CM | POA: Diagnosis not present

## 2019-11-06 DIAGNOSIS — O99011 Anemia complicating pregnancy, first trimester: Secondary | ICD-10-CM | POA: Diagnosis not present

## 2019-11-06 DIAGNOSIS — O99341 Other mental disorders complicating pregnancy, first trimester: Secondary | ICD-10-CM | POA: Insufficient documentation

## 2019-11-06 DIAGNOSIS — F17210 Nicotine dependence, cigarettes, uncomplicated: Secondary | ICD-10-CM | POA: Diagnosis not present

## 2019-11-06 DIAGNOSIS — Z79899 Other long term (current) drug therapy: Secondary | ICD-10-CM | POA: Diagnosis not present

## 2019-11-06 DIAGNOSIS — O039 Complete or unspecified spontaneous abortion without complication: Secondary | ICD-10-CM | POA: Insufficient documentation

## 2019-11-06 DIAGNOSIS — Z6790 Unspecified blood type, Rh positive: Secondary | ICD-10-CM

## 2019-11-06 DIAGNOSIS — R102 Pelvic and perineal pain: Secondary | ICD-10-CM

## 2019-11-06 LAB — RAPID URINE DRUG SCREEN, HOSP PERFORMED
Amphetamines: NOT DETECTED
Barbiturates: NOT DETECTED
Benzodiazepines: NOT DETECTED
Cocaine: NOT DETECTED
Opiates: NOT DETECTED
Tetrahydrocannabinol: NOT DETECTED

## 2019-11-06 LAB — CBC
HCT: 32.2 % — ABNORMAL LOW (ref 36.0–46.0)
Hemoglobin: 9.8 g/dL — ABNORMAL LOW (ref 12.0–15.0)
MCH: 27.5 pg (ref 26.0–34.0)
MCHC: 30.4 g/dL (ref 30.0–36.0)
MCV: 90.2 fL (ref 80.0–100.0)
Platelets: 279 10*3/uL (ref 150–400)
RBC: 3.57 MIL/uL — ABNORMAL LOW (ref 3.87–5.11)
RDW: 13.2 % (ref 11.5–15.5)
WBC: 4.7 10*3/uL (ref 4.0–10.5)
nRBC: 0 % (ref 0.0–0.2)

## 2019-11-06 LAB — BASIC METABOLIC PANEL
Anion gap: 9 (ref 5–15)
BUN: 12 mg/dL (ref 6–20)
CO2: 23 mmol/L (ref 22–32)
Calcium: 9 mg/dL (ref 8.9–10.3)
Chloride: 107 mmol/L (ref 98–111)
Creatinine, Ser: 0.83 mg/dL (ref 0.44–1.00)
GFR calc Af Amer: >60 mL/min (ref >60)
GFR calc non Af Amer: >60 mL/min (ref >60)
Glucose, Bld: 96 mg/dL (ref 70–99)
Potassium: 3.4 mmol/L — ABNORMAL LOW (ref 3.5–5.1)
Sodium: 139 mmol/L (ref 135–145)

## 2019-11-06 MED ORDER — ONDANSETRON HCL 4 MG/2ML IJ SOLN
4.0000 mg | Freq: Once | INTRAMUSCULAR | Status: AC
  Administered 2019-11-06: 4 mg via INTRAVENOUS
  Filled 2019-11-06: qty 2

## 2019-11-06 MED ORDER — SODIUM CHLORIDE 0.9 % IV BOLUS
1000.0000 mL | Freq: Once | INTRAVENOUS | Status: AC
  Administered 2019-11-06: 1000 mL via INTRAVENOUS

## 2019-11-06 MED ORDER — FENTANYL CITRATE (PF) 100 MCG/2ML IJ SOLN
50.0000 ug | Freq: Once | INTRAMUSCULAR | Status: AC
  Administered 2019-11-06: 50 ug via INTRAVENOUS
  Filled 2019-11-06: qty 2

## 2019-11-06 MED ORDER — IBUPROFEN 600 MG PO TABS
600.0000 mg | ORAL_TABLET | Freq: Once | ORAL | Status: AC
  Administered 2019-11-06: 600 mg via ORAL
  Filled 2019-11-06: qty 1

## 2019-11-06 NOTE — ED Notes (Signed)
Patient stated she was [redacted] weeks pregnant.

## 2019-11-06 NOTE — ED Provider Notes (Addendum)
[facility] EMERGENCY DEPARTMENT Provider Note   CSN: [id number] Arrival date & time: 11/06/19  1033     History Chief Complaint  Patient presents with  . Miscarriage  . Weakness    Sharon Johnston is a 29 y.o. female.  Patient with history of hypertension, [id number], with ongoing miscarriage presents to the emergency department today for heavy vaginal bleeding.  Patient was seen at the MAU last night at about 8 weeks of pregnancy for vaginal bleeding.  She was found to have IUP without cardiac activity.  Patient opted for discharged home to complete the miscarriage.  Patient reports waking up around 6 AM today with blood soaking her sheets.  She states that she has changed large pads approximately 6 times since that time.  She denies lightheadedness, syncope, shortness of breath.  She has lower abdominal cramping without radiation.  She has been passing clots.  No chest pain or trouble breathing.  No nausea, vomiting or diarrhea.  She has had a miscarriage in the past.        Past Medical History:  Diagnosis Date  . Chlamydia   . Depression    hospitalized for 2 weeks after suicide att was post partum  . Hx of gonorrhea   . Hypertension    gestational HTN  . Kidney infection     Patient Active Problem List   Diagnosis Date Noted  . Insertion of implantable subdermal contraceptive 06/22/2017  . Chronic hypertension 10/31/2015  . Depression 06/15/2013    Past Surgical History:  Procedure Laterality Date  . NO PAST SURGERIES       OB History    Gravida  7   Para  4   Term  4   Preterm  0   AB  2   Living  4     SAB  1   TAB  1   Ectopic  0   Multiple  0   Live Births  4           Family History  Problem Relation Age of Onset  . Hypertension Mother   . Hypertension Maternal Grandmother   . Healthy Father   . Hypertension Maternal Aunt   . Depression Cousin     Social History   Tobacco Use  . Smoking status: Current  Some Day Smoker    Packs/day: 0.25    Types: Cigars  . Smokeless tobacco: Never Used  . Tobacco comment: two black and milds per day  Vaping Use  . Vaping Use: Never used  Substance Use Topics  . Alcohol use: No  . Drug use: No    Home Medications Prior to Admission medications   Medication Sig Start Date End Date Taking? Authorizing Provider  ibuprofen (ADVIL) 600 MG tablet Take 1 tablet (600 mg total) by mouth every 6 (six) hours as needed for up to 30 doses for moderate pain or cramping. 11/05/19   Nugent, Odie Sera, NP  oxyCODONE (ROXICODONE) 5 MG immediate release tablet Take 1 tablet (5 mg total) by mouth every 8 (eight) hours as needed for breakthrough pain. 11/05/19 11/04/20  Nugent, Odie Sera, NP  promethazine (PHENERGAN) 12.5 MG tablet Take 1 tablet (12.5 mg total) by mouth every 6 (six) hours as needed for nausea or vomiting. 11/05/19   Nugent, Odie Sera, NP  ferrous sulfate 325 (65 FE) MG tablet Take 1 tablet (325 mg total) by mouth 2 (two) times daily with a meal. 04/25/17 11/01/18  Doroteo Glassman,  Milderd Meager, DO  medroxyPROGESTERone (DEPO-PROVERA) 150 MG/ML injection Inject 1 mL (150 mg total) into the muscle every 3 (three) months. 07/26/17 04/22/19  Adam Phenix, MD    Allergies    Patient has no known allergies.  Review of Systems   Review of Systems  Constitutional: Negative for fever.  HENT: Negative for rhinorrhea and sore throat.   Eyes: Negative for redness.  Respiratory: Negative for cough and shortness of breath.   Cardiovascular: Negative for chest pain.  Gastrointestinal: Negative for abdominal pain, diarrhea, nausea and vomiting.  Genitourinary: Positive for pelvic pain and vaginal bleeding. Negative for dysuria, frequency, hematuria and urgency.  Musculoskeletal: Negative for myalgias.  Skin: Negative for rash.  Neurological: Positive for weakness. Negative for syncope, light-headedness and headaches.    Physical Exam Updated Vital Signs BP (!) 98/43 (BP Location: Right  Arm)   Pulse (!) 54   Temp (!) 97.2 F (36.2 C)   Resp 16   Ht 5\' 2"  (1.575 m)   Wt 56.7 kg   LMP 09/04/2019 Comment: sometime last month  SpO2 99%   BMI 22.86 kg/m   Physical Exam Vitals and nursing note reviewed. Exam conducted with a chaperone present.  Constitutional:      General: She is not in acute distress.    Appearance: She is well-developed.  HENT:     Head: Normocephalic and atraumatic.     Right Ear: External ear normal.     Left Ear: External ear normal.     Nose: Nose normal.  Eyes:     Conjunctiva/sclera: Conjunctivae normal.  Cardiovascular:     Rate and Rhythm: Regular rhythm. Bradycardia present.     Heart sounds: No murmur heard.   Pulmonary:     Effort: No respiratory distress.     Breath sounds: No wheezing, rhonchi or rales.  Abdominal:     Palpations: Abdomen is soft.     Tenderness: There is abdominal tenderness. There is no guarding or rebound.     Comments: Mild to moderate suprapubic abdominal pain.  Genitourinary:    Exam position: Lithotomy position.     Vagina: Bleeding present.     Comments: Moderate amt dark red blood pooled in vagina -- no clots.  Musculoskeletal:     Cervical back: Normal range of motion and neck supple.     Right lower leg: No edema.     Left lower leg: No edema.  Skin:    General: Skin is warm and dry.     Findings: No rash.  Neurological:     General: No focal deficit present.     Mental Status: She is alert. Mental status is at baseline.     Motor: No weakness.  Psychiatric:        Mood and Affect: Mood normal.     ED Results / Procedures / Treatments   Labs (all labs ordered are listed, but only abnormal results are displayed) Labs Reviewed  CBC - Abnormal; Notable for the following components:      Result Value   RBC 3.57 (*)    Hemoglobin 9.8 (*)    HCT 32.2 (*)    All other components within normal limits  BASIC METABOLIC PANEL - Abnormal; Notable for the following components:   Potassium 3.4  (*)    All other components within normal limits  TYPE AND SCREEN    EKG None  Radiology 09/06/2019 OB Transvaginal  Result Date: 11/05/2019 CLINICAL DATA:  Vaginal bleeding. EXAM:  OBSTETRIC <14 WK [place] AND TRANSVAGINAL OB US TECHNIQUE: Both transabdominal and transvaginal ultrasound examinations were performed for complete evaluation of the gestation as well as the maternal uterus, adnexal regions, and pelvic cul-de-sac. Transvaginal technique was performed to assess early pregnancy. COMPARISON:  October 10, 2019 FINDINGS: Intrauterine gestational sac: Single Yolk sac:  Visualized (abnormal in appearance). Embryo:  Visualized. Cardiac Activity: Not Visualized. Heart Rate: N/A  bpm CRL:  3.5 mm   6 w   0 d                  [place] EDC: June 30, 2020 Subchorionic hemorrhage:  A small subchorionic hemorrhage is seen. Maternal uterus/adnexae: The bilateral ovaries are visualized and are normal in appearance. No pelvic free fluid is noted. IMPRESSION: Single intrauterine pregnancy at approximately 6 weeks and 0 days gestation without evidence of fetal heart motion. Correlation with follow-up pelvic ultrasound is recommended. Electronically Signed   By: Aram Candela M.D.   On: 11/05/2019 19:23    Procedures Procedures (including critical care time)  Medications Ordered in ED Medications  sodium chloride 0.9 % bolus 1,000 mL (has no administration in time range)  fentaNYL (SUBLIMAZE) injection 50 mcg (50 mcg Intravenous Given 11/06/19 1221)  ondansetron (ZOFRAN) injection 4 mg (4 mg Intravenous Given 11/06/19 1220)    ED Course  I have reviewed the triage vital signs and the nursing notes.  Pertinent labs & imaging results that were available during my care of the patient were reviewed by me and considered in my medical decision making (see chart for details).  Patient seen and examined.  Reviewed MAU work-up and plan from yesterday.  Reviewed ultrasound results and findings.  Suspect patient is in process  of completing miscarriage.  Will treat pain.  Will check orthostatic vital signs given that patient is hypotensive here, which is not typical for her.  She had elevated blood pressures in MAU yesterday.  Will give fluid bolus, check CBC.  Overall, patient appears well at this time.  She remains bradycardic.  Vital signs reviewed and are as follows: BP (!) 98/43 (BP Location: Right Arm)   Pulse (!) 54   Temp (!) 97.2 F (36.2 C)   Resp 16   Ht 5\' 2"  (1.575 m)   Wt 56.7 kg   LMP 09/04/2019 Comment: sometime last month  SpO2 99%   BMI 22.86 kg/m   12:20 PM Spoke with MAU provider 09/06/2019 NP.  They will be happy to monitor patient in the MAU.  We discussed hemoglobin and orthostatic vital signs.  Patient rechecked.  She has not yet received medication or IV fluids.  Pain is currently 8 out of 10.  She is orthostatic with standing with increase of heart rate and decrease in blood pressure.  She is agreeable to monitoring in MAU.   Orthostatic VS for the past 24 hrs:  BP- Lying Pulse- Lying BP- Sitting Pulse- Sitting BP- Standing at 0 minutes Pulse- Standing at 0 minutes  11/06/19 1151 110/60 53 105/59 75 99/56 95    Pelvic exam performed with NT chaperone prior to transfer.      MDM Rules/Calculators/A&P                          Ongoing vaginal bleeding, mild anemia and orthostasis in setting of spontaneous abortion.  No indication for blood transfusion at this point.  Patient to be monitored in MAU.   Final Clinical Impression(s) /  ED Diagnoses Final diagnoses:  Vaginal bleeding  Spontaneous abortion    Rx / DC Orders ED Discharge Orders    None           Renne CriglerGeiple, Thursa Emme, PA-C 11/06/19 1324    Sabas SousBero, Michael M, MD 11/06/19 1433

## 2019-11-06 NOTE — MAU Provider Note (Signed)
History     CSN: [id number]  Arrival date and time: 11/06/19 1342   First Provider Initiated Contact with Patient 11/06/19 1415      Chief Complaint  Patient presents with  . Miscarriage  . Weakness   [name] is a 29 y.o. [id number] at [redacted]w[redacted]d who presents to MAU for vaginal bleeding. Patient was seen in MAU yesterday and diagnosed with a miscarriage and was discharged home with minimal bleeding and desire to complete miscarriage at home. Patient was brought to [facility] by EMS this morning and then transferred to MAU for further evaluation for bleeding. Patient reports bleeding started around 6AM and she woke up to blood soaking her clothes and a large clot that she passed in the toilet. Patient denies seeing any tissue at home. Patient reports changing a pad once per hour for 6 hours, but that the bleeding slowed around the time she was transferred to MAU. Patient reports her pads at home were not soaked through. Patient also endorses cramping that she rates as 7/10. Patient reports she took ibuprofen and was given fentanyl and Zofran in the ED, which helped her pain temporarily.  Pt denies vaginal discharge/odor/itching. Pt denies N/V, abdominal pain, constipation, diarrhea, or urinary problems. Pt denies fever, chills, fatigue, sweating or changes in appetite. Pt denies SOB or chest pain. Pt denies dizziness, HA, light-headedness, weakness.  Allergies? NKDA Current medications/supplements? Pt reports taking 600mg  of ibuprofen at 6AM this morning   OB History    Gravida  7   Para  4   Term  4   Preterm  0   AB  2   Living  4     SAB  1   TAB  1   Ectopic  0   Multiple  0   Live Births  4           Past Medical History:  Diagnosis Date  . Chlamydia   . Depression    hospitalized for 2 weeks after suicide att was post partum  . Hx of gonorrhea   . Hypertension    gestational HTN  . Kidney infection     Past Surgical History:  Procedure Laterality  Date  . NO PAST SURGERIES      Family History  Problem Relation Age of Onset  . Hypertension Mother   . Hypertension Maternal Grandmother   . Healthy Father   . Hypertension Maternal Aunt   . Depression Cousin     Social History   Tobacco Use  . Smoking status: Current Some Day Smoker    Packs/day: 0.25    Types: Cigars  . Smokeless tobacco: Never Used  . Tobacco comment: two black and milds per day  Vaping Use  . Vaping Use: Never used  Substance Use Topics  . Alcohol use: No  . Drug use: No    Allergies: No Known Allergies  Medications Prior to Admission  Medication Sig Dispense Refill Last Dose  . ibuprofen (ADVIL) 600 MG tablet Take 1 tablet (600 mg total) by mouth every 6 (six) hours as needed for up to 30 doses for moderate pain or cramping. (Patient not taking: Reported on 11/06/2019) 30 tablet 0 Not Taking at Unknown time  . oxyCODONE (ROXICODONE) 5 MG immediate release tablet Take 1 tablet (5 mg total) by mouth every 8 (eight) hours as needed for breakthrough pain. (Patient not taking: Reported on 11/06/2019) 8 tablet 0 Not Taking at Unknown time  . promethazine (PHENERGAN) 12.5  MG tablet Take 1 tablet (12.5 mg total) by mouth every 6 (six) hours as needed for nausea or vomiting. (Patient not taking: Reported on 11/06/2019) 30 tablet 0 Not Taking at Unknown time    Review of Systems  Constitutional: Negative for chills, diaphoresis, fatigue and fever.  Eyes: Negative for visual disturbance.  Respiratory: Negative for shortness of breath.   Cardiovascular: Negative for chest pain.  Gastrointestinal: Negative for abdominal pain, constipation, diarrhea, nausea and vomiting.  Genitourinary: Positive for pelvic pain and vaginal bleeding. Negative for dysuria, flank pain, frequency, urgency and vaginal discharge.  Neurological: Negative for dizziness, weakness, light-headedness and headaches.   Physical Exam   Blood pressure 117/61, pulse (!) 52, temperature 98.3 F  (36.8 C), temperature source Oral, resp. rate 16, height  (1.575 m), weight 56.7 kg, last menstrual period 09/04/2019, SpO2 100 %.  Patient Vitals for the past 24 hrs:  BP Temp Temp src Pulse Resp SpO2 Height Weight  11/06/19 1501 -- -- -- -- -- 100 % -- --  11/06/19 1500 117/61 -- -- (!) 52 -- -- -- --  11/06/19 1456 -- -- -- -- -- 100 % -- --  11/06/19 1451 -- -- -- -- -- 100 % -- --  11/06/19 1446 -- -- -- -- -- 100 % -- --  11/06/19 1441 -- -- -- -- -- 99 % -- --  11/06/19 1436 -- -- -- -- -- 100 % -- --  11/06/19 1431 -- -- -- -- -- 99 % -- --  11/06/19 1426 -- -- -- -- -- 100 % -- --  11/06/19 1421 -- -- -- -- -- 100 % -- --  11/06/19 1416 -- -- -- -- -- 100 % -- --  11/06/19 1411 -- -- -- -- -- 100 % -- --  11/06/19 1401 -- -- -- -- -- 100 % -- --  11/06/19 1359 (!) 106/58 98.3 F (36.8 C) Oral (!) 57 16 100 % -- --  11/06/19 1356 -- -- -- -- -- 100 % -- --  11/06/19 1351 -- -- -- -- -- 100 % -- --  11/06/19 1346 -- -- -- -- -- 100 % -- --  11/06/19 1315 112/62 -- -- -- 19 -- -- --  11/06/19 1259 -- -- -- -- -- 100 % -- --  11/06/19 1258 -- -- -- -- -- 100 % -- --  11/06/19 1257 -- -- -- -- -- 100 % -- --  11/06/19 1256 -- -- -- -- -- 100 % -- --  11/06/19 1255 -- -- -- -- -- 100 % -- --  11/06/19 1254 -- -- -- -- -- 100 % -- --  11/06/19 1253 -- -- -- -- -- 100 % -- --  11/06/19 1252 -- -- -- -- -- 100 % -- --  11/06/19 1251 -- -- -- -- -- 100 % -- --  11/06/19 1250 -- -- -- -- -- 100 % -- --  11/06/19 1249 -- -- -- -- -- 100 % -- --  11/06/19 1248 -- -- -- -- -- 100 % -- --  11/06/19 1247 -- -- -- -- -- 100 % -- --  11/06/19 1246 -- -- -- -- -- 100 % -- --  11/06/19 1245 -- -- -- -- -- 100 % -- --  11/06/19 1244 -- -- -- -- -- 100 % -- --  11/06/19 1154 (!) 99/56 -- -- 95 20 100 % -- --  11/06/19 1054 (!) 107/56 -- -- (!) 45 17  100 % -- --  11/06/19 1053 (!) 98/43 (!) 97.2 F (36.2 C) -- (!) 54 16 99 % -- --  11/06/19 1050 -- -- -- -- -- 100 % 5\' 2"  (1.575 m)  56.7 kg   Physical Exam Vitals and nursing note reviewed. Exam conducted with a chaperone present.  Constitutional:      General: She is not in acute distress.    Appearance: Normal appearance. She is normal weight. She is not ill-appearing, toxic-appearing or diaphoretic.  HENT:     Head: Normocephalic and atraumatic.  Pulmonary:     Effort: Pulmonary effort is normal.  Abdominal:     Palpations: Abdomen is soft.  Genitourinary:    General: Normal vulva.     Labia:        Right: No rash, tenderness or lesion.        Left: No rash, tenderness or lesion.      Vagina: Bleeding present. No vaginal discharge or tenderness.     Cervix: Cervical bleeding present. No discharge, friability, lesion or erythema.     Comments: As speculum was inserted, vaginal fornix and canal full of dark red, liquid blood. After removal of liquid blood, sac-like structure present in canal and easily removed with Fox swab and sent to pathology. Single clot about the size of a golf ball removed along with sac-like structure. Small clot present in external os, unable to be removed, slow trickle of dark red blood present at external os after removal of products. Strong, pungent odor noted on removal of sac-like structure and blood clot. Skin:    General: Skin is warm and dry.  Neurological:     Mental Status: She is alert and oriented to person, place, and time.  Psychiatric:        Mood and Affect: Mood normal.        Behavior: Behavior normal.        Thought Content: Thought content normal.        Judgment: Judgment normal.    Results for orders placed or performed during the hospital encounter of 11/06/19 (from the past 24 hour(s))  Type and screen     Status: None   Collection Time: 11/06/19 10:35 AM  Result Value Ref Range   ABO/RH(D) A POS    Antibody Screen NEG    Sample Expiration      11/09/2019,2359 Performed at [facility], [address] [place], [place]   CBC     Status:  Abnormal   Collection Time: 11/06/19 11:05 AM  Result Value Ref Range   WBC 4.7 4.0 - 10.5 K/uL   RBC 3.57 (L) 3.87 - 5.11 MIL/uL   Hemoglobin 9.8 (L) 12.0 - 15.0 g/dL   HCT 11/08/19 (L) 36 - 46 %   MCV 90.2 80.0 - 100.0 fL   MCH 27.5 26.0 - 34.0 pg   MCHC 30.4 30.0 - 36.0 g/dL   RDW 41.2 87.8 - 67.6 %   Platelets 279 150 - 400 K/uL   nRBC 0.0 0.0 - 0.2 %  Basic metabolic panel     Status: Abnormal   Collection Time: 11/06/19 11:05 AM  Result Value Ref Range   Sodium 139 135 - 145 mmol/L   Potassium 3.4 (L) 3.5 - 5.1 mmol/L   Chloride 107 98 - 111 mmol/L   CO2 23 22 - 32 mmol/L   Glucose, Bld 96 70 - 99 mg/dL   BUN 12 6 - 20 mg/dL  Creatinine, Ser 0.83 0.44 - 1.00 mg/dL   Calcium 9.0 8.9 - 53.6 mg/dL   GFR calc non Af Amer >60 >60 mL/min   GFR calc Af Amer >60 >60 mL/min   Anion gap 9 5 - 15   US OB Transvaginal  Result Date: 11/06/2019 CLINICAL DATA:  29 year old pregnant female with vaginal bleeding. EXAM: TRANSVAGINAL OB ULTRASOUND TECHNIQUE: Transvaginal ultrasound was performed for complete evaluation of the gestation as well as the maternal uterus, adnexal regions, and pelvic cul-de-sac. COMPARISON:  Pregnancy ultrasound dated 11/05/2019. FINDINGS: The uterus is anteverted and appears unremarkable. The endometrium is thickened measuring 18 mm. The previously seen intrauterine pregnancy is not identified on today's exam. There is a focal area of heterogeneity in the upper endometrium anteriorly with no internal vascularity which may represent small amount of blood product. The ovaries are unremarkable. No significant free fluid in the pelvis. IMPRESSION: Interval miscarriage of the previously seen IUP. Electronically Signed   By: Elgie Collard M.D.   On: 11/06/2019 15:39   US OB Transvaginal  Result Date: 11/05/2019 CLINICAL DATA:  Vaginal bleeding. EXAM: OBSTETRIC <14 WK [place] AND TRANSVAGINAL OB US TECHNIQUE: Both transabdominal and transvaginal ultrasound examinations were  performed for complete evaluation of the gestation as well as the maternal uterus, adnexal regions, and pelvic cul-de-sac. Transvaginal technique was performed to assess early pregnancy. COMPARISON:  October 10, 2019 FINDINGS: Intrauterine gestational sac: Single Yolk sac:  Visualized (abnormal in appearance). Embryo:  Visualized. Cardiac Activity: Not Visualized. Heart Rate: N/A  bpm CRL:  3.5 mm   6 w   0 d                  [place] EDC: June 30, 2020 Subchorionic hemorrhage:  A small subchorionic hemorrhage is seen. Maternal uterus/adnexae: The bilateral ovaries are visualized and are normal in appearance. No pelvic free fluid is noted. IMPRESSION: Single intrauterine pregnancy at approximately 6 weeks and 0 days gestation without evidence of fetal heart motion. Correlation with follow-up pelvic ultrasound is recommended. Electronically Signed   By: Aram Candela M.D.   On: 11/05/2019 19:23   US OB LESS THAN 14 WEEKS WITH OB TRANSVAGINAL  Result Date: 10/10/2019 CLINICAL DATA:  Abdominal pain, positive UPT EXAM: OBSTETRIC <14 WK [place] AND TRANSVAGINAL OB US TECHNIQUE: Both transabdominal and transvaginal ultrasound examinations were performed for complete evaluation of the gestation as well as the maternal uterus, adnexal regions, and pelvic cul-de-sac. Transvaginal technique was performed to assess early pregnancy. COMPARISON:  Pelvic ultrasound 12/11/2018 FINDINGS: Intrauterine gestational sac: Single Yolk sac:  Visualized. Embryo:  Visualized. Cardiac Activity: Visualized. Heart Rate: 64 bpm MSD: 11.46 mm   5 w   6 d CRL:  1.72 mm, below threshold for accurate dating. Subchorionic hemorrhage:  None visualized. Maternal uterus/adnexae: Normal anteverted uterus. No concerning uterine abnormality. Right ovarian corpus luteum cyst. No concerning adnexal lesions. Trace anechoic free fluid. IMPRESSION: Single intrauterine gestational sac with a diminutive fetal pole, possibly related to early dating. Estimated  gestational age of [redacted] weeks 6 days by mean sac diameter given that the crown-rump length is below threshold for accurate sonographic estimation. Mild bradycardia though possibly low given technique related factors in size of the fetal pole. Consider obstetrical consultation with serial beta HCG and short-term 7 to 10 day interval follow-up ultrasound to reassess viability. Electronically Signed   By: Kreg Shropshire M.D.   On: 10/10/2019 18:21    MAU Course  Procedures  MDM -VB in setting of known  miscarriage -CBC in ED shows hgb 9.8, was 10.5 in MAU yesterday -A Positive -suspected intact gestational sac removed from vagina and sent to pathology -[place]: thickened endometrium at 18 mm, no IUP as previously seen, focal area of heterogeneity in the upper endometrium anteriorly with no internal vascularity which may represent small amount of blood product, no significant free fluid in the pelvis. -after removal of products, blood on pad small smear of blood on pad half the size of a dime and repeat speculum exam shows minute amount of dark red blood present in posterior fornix, able to be cleared away with single fox swab, no active bleeding noted from cervix -after administration of ibuprofen, pt reports pain is 0/10 -consulted with Dr. Jolayne Panther re: [place] report, hgb, odor and large difference in BP between yesterday and today. No cytotec necessary at this time and odor does not need prophylactic treatment for odor. No concerns about BP at this time. -pt discharged to home in stable condition  Orders Placed This Encounter  Procedures  . US OB Transvaginal    Standing Status:   Standing    Number of Occurrences:   1    Order Specific Question:   Symptom/Reason for Exam    Answer:   Miscarriage [[id number]]  . CBC    Standing Status:   Standing    Number of Occurrences:   1  . Basic metabolic panel    Standing Status:   Standing    Number of Occurrences:   1  . Urine rapid drug screen (hosp performed)     Standing Status:   Standing    Number of Occurrences:   1  . Orthostatic vital signs    Standing Status:   Standing    Number of Occurrences:   1  . Type and screen    Standing Status:   Standing    Number of Occurrences:   1  . Discharge patient    Order Specific Question:   Discharge disposition    Answer:   01-Home or Self Care [1]    Order Specific Question:   Discharge patient date    Answer:   11/06/2019   Meds ordered this encounter  Medications  . sodium chloride 0.9 % bolus 1,000 mL  . fentaNYL (SUBLIMAZE) injection 50 mcg  . ondansetron (ZOFRAN) injection 4 mg  . ibuprofen (ADVIL) tablet 600 mg    Assessment and Plan   1. Spontaneous abortion   2. Vaginal bleeding   3. Miscarriage   4. Blood type, Rh positive   5. Pelvic cramping     Allergies as of 11/06/2019   No Known Allergies     Medication List    TAKE these medications   ibuprofen 600 MG tablet Commonly known as: ADVIL Take 1 tablet (600 mg total) by mouth every 6 (six) hours as needed for up to 30 doses for moderate pain or cramping.   oxyCODONE 5 MG immediate release tablet Commonly known as: Roxicodone Take 1 tablet (5 mg total) by mouth every 8 (eight) hours as needed for breakthrough pain.   promethazine 12.5 MG tablet Commonly known as: PHENERGAN Take 1 tablet (12.5 mg total) by mouth every 6 (six) hours as needed for nausea or vomiting.      -message sent to [facility] yesterday to schedule f/u visits -pt sent pain medication and medication for N/V to pharmacy yesterday and states she already picked it up and has it at home for use PRN -return MAU  precautions given -pt discharged to home in stable condition  Odie Seraicole E Byard Carranza 11/06/2019, 5:45 PM

## 2019-11-06 NOTE — ED Triage Notes (Signed)
Patient arrived via [facility] stating she had a miscarriaged on 11/05/19 and was discharged from women last night at 8pm. Patient stated that now she is bleeding heavy and has changed 6 pads since she woke up at 6am. Per EMS patient VS- 98/60, 98.3, 100%, 16 [id number]-122.

## 2019-11-06 NOTE — MAU Note (Signed)
Dx with miscarriage yesterday, here today with increased bleeding.  Sent from ER for further eval.

## 2019-11-06 NOTE — Discharge Instructions (Signed)
Miscarriage A miscarriage is the loss of an unborn baby (fetus) before the 20th week of pregnancy. Most miscarriages happen during the first 3 months of pregnancy. Sometimes, a miscarriage can happen before a woman knows that she is pregnant. Having a miscarriage can be an emotional experience. If you have had a miscarriage, talk with your health care provider about any questions you may have about miscarrying, the grieving process, and your plans for future pregnancy. What are the causes? A miscarriage may be caused by:  Problems with the genes or chromosomes of the fetus. These problems make it impossible for the baby to develop normally. They are often the result of random errors that occur early in the development of the baby, and are not passed from parent to child (not inherited).  Infection of the cervix or uterus.  Conditions that affect hormone balance in the body.  Problems with the cervix, such as the cervix opening and thinning before pregnancy is at term (cervical insufficiency).  Problems with the uterus. These may include: ? A uterus with an abnormal shape. ? Fibroids in the uterus. ? Congenital abnormalities. These are problems that were present at birth.  Certain medical conditions.  Smoking, drinking alcohol, or using drugs.  Injury (trauma). In many cases, the cause of a miscarriage is not known. What are the signs or symptoms? Symptoms of this condition include:  Vaginal bleeding or spotting, with or without cramps or pain.  Pain or cramping in the abdomen or lower back.  Passing fluid, tissue, or blood clots from the vagina. How is this diagnosed? This condition may be diagnosed based on:  A physical exam.  Ultrasound.  Blood tests.  Urine tests. How is this treated? Treatment for a miscarriage is sometimes not necessary if you naturally pass all the tissue that was in your uterus. If necessary, this condition may be treated with:  Dilation and  curettage (D&C). This is a procedure in which the cervix is stretched open and the lining of the uterus (endometrium) is scraped. This is done only if tissue from the fetus or placenta remains in the body (incomplete miscarriage).  Medicines, such as: ? Antibiotic medicine, to treat infection. ? Medicine to help the body pass any remaining tissue. ? Medicine to reduce (contract) the size of the uterus. These medicines may be given if you have a lot of bleeding. If you have Rh negative blood and your baby was Rh positive, you will need a shot of a medicine called Rh immunoglobulinto protect your future babies from Rh blood problems. "Rh-negative" and "Rh-positive" refer to whether or not the blood has a specific protein found on the surface of red blood cells (Rh factor). Follow these instructions at home: Medicines   Take over-the-counter and prescription medicines only as told by your health care provider.  If you were prescribed antibiotic medicine, take it as told by your health care provider. Do not stop taking the antibiotic even if you start to feel better.  Do not take NSAIDs, such as aspirin and ibuprofen, unless they are approved by your health care provider. These medicines can cause bleeding. Activity  Rest as directed. Ask your health care provider what activities are safe for you.  Have someone help with home and family responsibilities during this time. General instructions  Keep track of the number of sanitary pads you use each day and how soaked (saturated) they are. Write down this information.  Monitor the amount of tissue or blood clots that   you pass from your vagina. Save any large amounts of tissue for your health care provider to examine.  Do not use tampons, douche, or have sex until your health care provider approves.  To help you and your partner with the process of grieving, talk with your health care provider or seek counseling.  When you are ready, meet with  your health care provider to discuss any important steps you should take for your health. Also, discuss steps you should take to have a healthy pregnancy in the future.  Keep all follow-up visits as told by your health care provider. This is important. Where to find more information  The American Congress of Obstetricians and Gynecologists: www.acog.org  [place] Department of Health and Human Services Office of Women's Health: www.womenshealth.gov Contact a health care provider if:  You have a fever or chills.  You have a foul smelling vaginal discharge.  You have more bleeding instead of less. Get help right away if:  You have severe cramps or pain in your back or abdomen.  You pass blood clots or tissue from your vagina that is walnut-sized or larger.  You soak more than 1 regular sanitary pad in an hour.  You become light-headed or weak.  You pass out.  You have feelings of sadness that take over your thoughts, or you have thoughts of hurting yourself. Summary  Most miscarriages happen in the first 3 months of pregnancy. Sometimes miscarriage happens before a woman even knows that she is pregnant.  Follow your health care provider's instruction for home care. Keep all follow-up appointments.  To help you and your partner with the process of grieving, talk with your health care provider or seek counseling. This information is not intended to replace advice given to you by your health care provider. Make sure you discuss any questions you have with your health care provider. Document Revised: 06/29/2018 Document Reviewed: 04/12/2016 Elsevier Patient Education  2020 Elsevier Inc.   Managing Pregnancy Loss Pregnancy loss can happen any time during a pregnancy. Often the cause is not known. It is rarely because of anything you did. Pregnancy loss in early pregnancy (during the first trimester) is called a miscarriage. This type of pregnancy loss is the most common. Pregnancy loss  that happens after 20 weeks of pregnancy is called fetal demise if the baby's heart stops beating before birth. Fetal demise is much less common. Some women experience spontaneous labor shortly after fetal demise resulting in a stillborn birth (stillbirth). Any pregnancy loss can be devastating. You will need to recover both physically and emotionally. Most women are able to get pregnant again after a pregnancy loss and deliver a healthy baby. How to manage emotional recovery  Pregnancy loss is very hard emotionally. You may feel many different emotions while you grieve. You may feel sad and angry. You may also feel guilty. It is normal to have periods of crying. Emotional recovery can take longer than physical recovery. It is different for everyone. Taking these steps can help you in managing this loss:  Remember that it is unlikely you did anything to cause the pregnancy loss.  Share your thoughts and feelings with friends, family, and your partner. Remember that your partner is also recovering emotionally.  Make sure you have a good support system. Do not spend too much time alone.  Meet with a pregnancy loss counselor or join a pregnancy loss support group.  Get enough sleep and eat a healthy diet. Return to regular exercise   when you have recovered physically.  Do not use drugs or alcohol to manage your emotions.  Consider seeing a mental health professional to help you recover emotionally.  Ask a friend or loved one to help you decide what to do with any clothing and nursery items you received for your baby. In the case of a stillbirth, many women benefit from taking additional steps in the grieving process. You may want to:  Hold your baby after the birth.  Name your baby.  Request a birth certificate.  Create a keepsake such as handprints or footprints.  Dress your baby and have a picture taken.  Make funeral arrangements.  Ask for a baptism or blessing. Hospitals have  staff members who can help you with all these arrangements. How to recognize emotional stress It is normal to have emotional stress after a pregnancy loss. But emotional stress that lasts a long time or becomes severe requires treatment. Watch out for these signs of severe emotional stress:  Sadness, anger, or guilt that is not going away and is interfering with your normal activities.  Relationship problems that have occurred or gotten worse since the pregnancy loss.  Signs of depression that last longer than 2 weeks. These may include: ? Sadness. ? Anxiety. ? Hopelessness. ? Loss of interest in activities you enjoy. ? Inability to concentrate. ? Trouble sleeping or sleeping too much. ? Loss of appetite or overeating. ? Thoughts of death or of hurting yourself. Follow these instructions at home:  Take over-the-counter and prescription medicines only as told by your health care provider.  Rest at home until your energy level returns. Return to your normal activities as told by your health care provider. Ask your health care provider what activities are safe for you.  When you are ready, meet with your health care provider to discuss steps to take for a future pregnancy.  Keep all follow-up visits as told by your health care provider. This is important. Where to find support  To help you and your partner with the process of grieving, talk with your health care provider or seek counseling.  Consider meeting with others who have experienced pregnancy loss. Ask your health care provider about support groups and resources. Where to find more information  [place] Department of Health and Human Services Office on Women's Health: www.womenshealth.gov  American Pregnancy Association: www.americanpregnancy.org Contact a health care provider if:  You continue to experience grief, sadness, or lack of motivation for everyday activities, and those feelings do not improve over time.  You are  struggling to recover emotionally, especially if you are using alcohol or substances to help. Get help right away if:  You have thoughts of hurting yourself or others. If you ever feel like you may hurt yourself or others, or have thoughts about taking your own life, get help right away. You can go to your nearest emergency department or call:  Your local emergency services (911 in the [place]  A suicide crisis helpline, such as the National Suicide Prevention Lifeline at 1-800-273-8255. This is open 24 hours a day. Summary  Any pregnancy loss can be difficult physically and emotionally.  You may experience many different emotions while you grieve. Emotional recovery can last longer than physical recovery.  It is normal to have emotional stress after a pregnancy loss. But emotional stress that lasts a long time or becomes severe requires treatment.  See your health care provider if you are struggling emotionally after a pregnancy loss. This information   is not intended to replace advice given to you by your health care provider. Make sure you discuss any questions you have with your health care provider. Document Revised: 06/27/2018 Document Reviewed: 05/18/2017 Elsevier Patient Education  2020 Elsevier Inc.  

## 2019-11-07 LAB — CULTURE, OB URINE: Culture: >=100000 — AB

## 2019-11-07 LAB — TYPE AND SCREEN
ABO/RH(D): A POS
Antibody Screen: NEGATIVE

## 2019-11-08 LAB — SURGICAL PATHOLOGY

## 2019-11-12 ENCOUNTER — Other Ambulatory Visit: Payer: Medicaid - Out of State

## 2019-11-14 ENCOUNTER — Other Ambulatory Visit: Payer: Self-pay | Admitting: Women's Health

## 2019-11-14 DIAGNOSIS — N3000 Acute cystitis without hematuria: Secondary | ICD-10-CM

## 2019-11-14 MED ORDER — CEFADROXIL 500 MG PO CAPS: 500.0000 mg | ORAL_CAPSULE | Freq: Two times a day (BID) | ORAL | 0 refills | Status: AC

## 2019-11-14 NOTE — Progress Notes (Signed)
RX Duricef for UTI.  Marylen Ponto, NP  4:02 PM 11/14/2019

## 2019-11-19 ENCOUNTER — Ambulatory Visit: Payer: Medicaid - Out of State | Admitting: Obstetrics and Gynecology

## 2019-12-01 ENCOUNTER — Emergency Department (HOSPITAL_COMMUNITY)
Admission: EM | Admit: 2019-12-01 | Discharge: 2019-12-01 | Disposition: A | Discharge status: Home / Self Care | Payer: Medicaid - Out of State | Attending: Emergency Medicine | Admitting: Emergency Medicine

## 2019-12-01 ENCOUNTER — Other Ambulatory Visit: Payer: Self-pay

## 2019-12-01 ENCOUNTER — Encounter (HOSPITAL_COMMUNITY): Payer: Self-pay | Admitting: Emergency Medicine

## 2019-12-01 DIAGNOSIS — Z20822 Contact with and (suspected) exposure to covid-19: Secondary | ICD-10-CM | POA: Insufficient documentation

## 2019-12-01 DIAGNOSIS — R439 Unspecified disturbances of smell and taste: Secondary | ICD-10-CM | POA: Diagnosis not present

## 2019-12-01 DIAGNOSIS — Z5321 Procedure and treatment not carried out due to patient leaving prior to being seen by health care provider: Secondary | ICD-10-CM | POA: Insufficient documentation

## 2019-12-01 LAB — SARS CORONAVIRUS 2 BY RT PCR (HOSPITAL ORDER, PERFORMED IN ~~LOC~~ HOSPITAL LAB): SARS Coronavirus 2: NEGATIVE

## 2019-12-01 LAB — POC URINE PREG, ED: Preg Test, Ur: NEGATIVE

## 2019-12-01 NOTE — ED Triage Notes (Signed)
Pt reports known COVID exposure.  Reports loss of taste, loss of smell, and sore throat x 2 days.  States LMP was approx 2 months ago and that she is unsure if she is pregnant or not.  However, pt has + Hcg and + urine preg test in July and August.  States that no one has told her she is pregnant.

## 2019-12-01 NOTE — ED Notes (Signed)
Pt stated she was leaving.  

## 2020-01-02 ENCOUNTER — Encounter (HOSPITAL_COMMUNITY): Payer: Self-pay

## 2020-01-02 ENCOUNTER — Other Ambulatory Visit: Payer: Self-pay

## 2020-01-02 ENCOUNTER — Emergency Department (HOSPITAL_COMMUNITY)
Admission: EM | Admit: 2020-01-02 | Discharge: 2020-01-03 | Disposition: A | Discharge status: Home / Self Care | Payer: Medicaid - Out of State | Attending: Emergency Medicine | Admitting: Emergency Medicine

## 2020-01-02 DIAGNOSIS — F17290 Nicotine dependence, other tobacco product, uncomplicated: Secondary | ICD-10-CM | POA: Insufficient documentation

## 2020-01-02 DIAGNOSIS — R439 Unspecified disturbances of smell and taste: Secondary | ICD-10-CM | POA: Insufficient documentation

## 2020-01-02 DIAGNOSIS — R52 Pain, unspecified: Secondary | ICD-10-CM | POA: Diagnosis not present

## 2020-01-02 DIAGNOSIS — M791 Myalgia, unspecified site: Secondary | ICD-10-CM | POA: Insufficient documentation

## 2020-01-02 DIAGNOSIS — R062 Wheezing: Secondary | ICD-10-CM | POA: Diagnosis not present

## 2020-01-02 DIAGNOSIS — R0981 Nasal congestion: Secondary | ICD-10-CM | POA: Insufficient documentation

## 2020-01-02 DIAGNOSIS — R059 Cough, unspecified: Secondary | ICD-10-CM | POA: Diagnosis present

## 2020-01-02 DIAGNOSIS — Z20822 Contact with and (suspected) exposure to covid-19: Secondary | ICD-10-CM | POA: Diagnosis not present

## 2020-01-02 DIAGNOSIS — R0602 Shortness of breath: Secondary | ICD-10-CM | POA: Insufficient documentation

## 2020-01-02 DIAGNOSIS — R509 Fever, unspecified: Secondary | ICD-10-CM | POA: Diagnosis not present

## 2020-01-02 DIAGNOSIS — I10 Essential (primary) hypertension: Secondary | ICD-10-CM | POA: Diagnosis not present

## 2020-01-02 MED ORDER — PROMETHAZINE-DM 6.25-15 MG/5ML PO SYRP: 5.0000 mL | ORAL_SOLUTION | Freq: Four times a day (QID) | ORAL | 0 refills | Status: DC | PRN

## 2020-01-02 MED ORDER — PREDNISONE 20 MG PO TABS: 40.0000 mg | ORAL_TABLET | Freq: Every day | ORAL | 0 refills | Status: AC

## 2020-01-02 MED ORDER — ALBUTEROL SULFATE HFA 108 (90 BASE) MCG/ACT IN AERS
2.0000 | INHALATION_SPRAY | Freq: Once | RESPIRATORY_TRACT | Status: AC
  Administered 2020-01-02: 2 via RESPIRATORY_TRACT
  Filled 2020-01-02: qty 6.7

## 2020-01-02 MED ORDER — BENZONATATE 100 MG PO CAPS: 100.0000 mg | ORAL_CAPSULE | Freq: Three times a day (TID) | ORAL | 0 refills | Status: DC

## 2020-01-02 MED ORDER — IBUPROFEN 800 MG PO TABS
800.0000 mg | ORAL_TABLET | Freq: Once | ORAL | Status: AC
  Administered 2020-01-02: 800 mg via ORAL
  Filled 2020-01-02: qty 1

## 2020-01-02 MED ORDER — PREDNISONE 20 MG PO TABS
60.0000 mg | ORAL_TABLET | Freq: Once | ORAL | Status: AC
  Administered 2020-01-02: 60 mg via ORAL
  Filled 2020-01-02: qty 3

## 2020-01-02 NOTE — ED Triage Notes (Addendum)
Pt presents via [facility] from home c/o SOB, cough,body aches, fever and loss of taste starting today. Denies known sick exposure, hx asthma, htn but noncompliant with medication, given 1000mg  APAP with EMS

## 2020-01-02 NOTE — Discharge Instructions (Addendum)
You likely have covid, which is a viral illness.  This should be treated symptomatically. Use Tylenol and/or ibuprofen as needed for fevers or body aches. Use albuterol as needed for wheezing, chest tightness, or shortness of breath.  Take prednisone as prescribed.  Use cough drops and syrup as needed. Make sure you stay well-hydrated with water. It is extremely important that you isolate at home for 10 days. You may end isolation if you are fever free for 24 hours without medication and your symptoms are not worsening.  Wash your hands frequently to prevent spread of infection. Follow up with the covid clinic below if your test is positive. They can help set up the monoclonal antibody treatment if you are interested.  If you test is negative, it is likely just that we are testing too soon into the illness. You still need to quarantine. You can get retested (at [facility], [facility], or other outpatient clinic) in 5 days.  Return to the emergency room if you develop chest pain, difficulty breathing, or any new or worsening symptoms.

## 2020-01-02 NOTE — ED Provider Notes (Signed)
[facility]   CSN: [id number] Arrival date & time: 01/02/20  2146     History Chief Complaint  Patient presents with  . Fever  . Cough    Sharon Johnston is a 29 y.o. female presenting for evaluation of body aches, cough, shortness of breath, sore throat, nasal congestion, loss of taste and smell.  Patient states her symptoms pain began today.  She states she is short of breath with exertion, no shortness of breath at rest.  Her cough is productive of yellow sputum.  She was feeling well yesterday.  She denies sick contacts at home or work.  She received only one of the two Covid vaccines several months ago.  She has a history of asthma, does not take medication for this regularly and does not have anything at home.  She has not taken anything for her symptoms.  She denies headache, chest pain, nausea, vomiting or abdominal pain.  She has no other medical problems, takes no medications daily.  HPI     Past Medical History:  Diagnosis Date  . Chlamydia   . Depression    hospitalized for 2 weeks after suicide att was post partum  . Hx of gonorrhea   . Hypertension    gestational HTN  . Kidney infection     Patient Active Problem List   Diagnosis Date Noted  . Insertion of implantable subdermal contraceptive 06/22/2017  . Chronic hypertension 10/31/2015  . Depression 06/15/2013    Past Surgical History:  Procedure Laterality Date  . NO PAST SURGERIES       OB History    Gravida  7   Para  4   Term  4   Preterm  0   AB  2   Living  4     SAB  1   TAB  1   Ectopic  0   Multiple  0   Live Births  4           Family History  Problem Relation Age of Onset  . Hypertension Mother   . Hypertension Maternal Grandmother   . Healthy Father   . Hypertension Maternal Aunt   . Depression Cousin     Social History   Tobacco Use  . Smoking status: Current Some Day Smoker    Packs/day: 0.25    Types:  Cigars  . Smokeless tobacco: Never Used  . Tobacco comment: two black and milds per day  Vaping Use  . Vaping Use: Never used  Substance Use Topics  . Alcohol use: No  . Drug use: No    Home Medications Prior to Admission medications   Medication Sig Start Date End Date Taking? Authorizing Provider  benzonatate (TESSALON) 100 MG capsule Take 1 capsule (100 mg total) by mouth every 8 (eight) hours. 01/02/20   Cleora Karnik, PA-C  ibuprofen (ADVIL) 600 MG tablet Take 1 tablet (600 mg total) by mouth every 6 (six) hours as needed for up to 30 doses for moderate pain or cramping. Patient not taking: Reported on 11/06/2019 11/05/19   Nugent, Odie Sera, NP  oxyCODONE (ROXICODONE) 5 MG immediate release tablet Take 1 tablet (5 mg total) by mouth every 8 (eight) hours as needed for breakthrough pain. Patient not taking: Reported on 11/06/2019 11/05/19 11/04/20  Nugent, Odie Sera, NP  predniSONE (DELTASONE) 20 MG tablet Take 2 tablets (40 mg total) by mouth daily for 4 days. 01/03/20 01/07/20  Kass Herberger, Jeanette Caprice, PA-C  promethazine (PHENERGAN) 12.5 MG tablet Take 1 tablet (12.5 mg total) by mouth every 6 (six) hours as needed for nausea or vomiting. Patient not taking: Reported on 11/06/2019 11/05/19   Nugent, Odie Sera, NP  promethazine-dextromethorphan (PROMETHAZINE-DM) 6.25-15 MG/5ML syrup Take 5 mLs by mouth 4 (four) times daily as needed for cough. 01/02/20   Januel Doolan, PA-C  ferrous sulfate 325 (65 FE) MG tablet Take 1 tablet (325 mg total) by mouth 2 (two) times daily with a meal. 04/25/17 11/01/18  Pincus Large, DO  medroxyPROGESTERone (DEPO-PROVERA) 150 MG/ML injection Inject 1 mL (150 mg total) into the muscle every 3 (three) months. 07/26/17 04/22/19  Adam Phenix, MD    Allergies    Patient has no known allergies.  Review of Systems   Review of Systems  Constitutional: Positive for fever.  HENT: Positive for congestion and sore throat.        Loss of taste and smell  Respiratory:  Positive for cough and shortness of breath.   Musculoskeletal: Positive for myalgias.  All other systems reviewed and are negative.   Physical Exam Updated Vital Signs BP (!) 138/95   Pulse 81   Temp 99.7 F (37.6 C)   Resp 17   Ht 5\' 2"  (1.575 m)   Wt 56.7 kg   LMP 09/04/2019 Comment: sometime last month  SpO2 98%   BMI 22.86 kg/m   Physical Exam Vitals and nursing note reviewed.  Constitutional:      General: She is not in acute distress.    Appearance: She is well-developed.     Comments: Appears nontoxic  HENT:     Head: Normocephalic and atraumatic.  Eyes:     Extraocular Movements: Extraocular movements intact.     Conjunctiva/sclera: Conjunctivae normal.     Pupils: Pupils are equal, round, and reactive to light.  Cardiovascular:     Rate and Rhythm: Normal rate and regular rhythm.     Pulses: Normal pulses.  Pulmonary:     Effort: Pulmonary effort is normal. No respiratory distress.     Breath sounds: Wheezing present.     Comments: Expiratory wheezing in bilateral lower bases.  Speaking in full sentences.  Sats stable on room air. Abdominal:     General: There is no distension.     Palpations: Abdomen is soft. There is no mass.     Tenderness: There is no abdominal tenderness. There is no guarding or rebound.  Musculoskeletal:        General: Normal range of motion.     Cervical back: Normal range of motion and neck supple.     Right lower leg: No edema.     Left lower leg: No edema.  Skin:    General: Skin is warm and dry.     Capillary Refill: Capillary refill takes less than 2 seconds.  Neurological:     Mental Status: She is alert and oriented to person, place, and time.     ED Results / Procedures / Treatments   Labs (all labs ordered are listed, but only abnormal results are displayed) Labs Reviewed  RESPIRATORY PANEL BY RT PCR (FLU A&B, COVID)    EKG None  Radiology No results found.  Procedures Procedures (including critical care  time)  Medications Ordered in ED Medications  albuterol (VENTOLIN HFA) 108 (90 Base) MCG/ACT inhaler 2 puff (2 puffs Inhalation Given 01/02/20 2243)  predniSONE (DELTASONE) tablet 60 mg (60 mg Oral Given 01/02/20 2242)  ibuprofen (ADVIL) tablet  800 mg (800 mg Oral Given 01/02/20 2243)    ED Course  I have reviewed the triage vital signs and the nursing notes.  Pertinent labs & imaging results that were available during my care of the patient were reviewed by me and considered in my medical decision making (see chart for details).    MDM Rules/Calculators/A&P                          Patient presenting for evaluation of 1 day of symptoms consistent with Covid.  On exam, patient appears nontoxic.  Pulmonary exam is overall reassuring and that she is normal sats at rest.  However she does have expiratory wheezing, has untreated asthma.  Will give albuterol, make sure patient is able to ambulate without hypoxia, and continue to have pt treat symptomatically at home. If covid test is positive, pt will qualify for MAB tx, will have pt f/u on covid clinic for possible mab tx if positive.  On reassessment after medication, patient reports improvement of shortness of breath.  She was able to ambulate without hypoxia, tachycardia, or significant tachypnea.  Doubt PE.  Likely Covid, likely exacerbating baseline asthma.  Discussed symptomatic treatment at home.  Will give a short course of prednisone for asthma exacerbation.  Discussed result of Covid is pending.  At this time, patient appears safe for discharge.  Return precautions given.  Patient states she understands and agrees to plan.  Sharon Johnston was evaluated in Emergency Department on 01/02/2020 for the symptoms described in the history of present illness. She was evaluated in the context of the global COVID-19 pandemic, which necessitated consideration that the patient might be at risk for infection with the SARS-CoV-2 virus that causes  COVID-19. Institutional protocols and algorithms that pertain to the evaluation of patients at risk for COVID-19 are in a state of rapid change based on information released by regulatory bodies including the CDC and federal and state organizations. These policies and algorithms were followed during the patient's care in the ED.  Final Clinical Impression(s) / ED Diagnoses Final diagnoses:  Suspected COVID-19 virus infection    Rx / DC Orders ED Discharge Orders         Ordered    predniSONE (DELTASONE) 20 MG tablet  Daily        01/02/20 2250    benzonatate (TESSALON) 100 MG capsule  Every 8 hours        01/02/20 2250    promethazine-dextromethorphan (PROMETHAZINE-DM) 6.25-15 MG/5ML syrup  4 times daily PRN        01/02/20 2250           Jahn Franchini, PA-C 01/02/20 2351    Terrilee Files, MD 01/03/20 1249

## 2020-01-02 NOTE — ED Notes (Signed)
Pt ambulated a lap around the nurses station, approximately 361ft. Pt in no distress during ambulation and oxygen saturation remained between 97-99% on room air.

## 2020-01-03 LAB — RESPIRATORY PANEL BY RT PCR (FLU A&B, COVID)
Influenza A by PCR: NEGATIVE
Influenza B by PCR: NEGATIVE
SARS Coronavirus 2 by RT PCR: NEGATIVE

## 2020-01-07 ENCOUNTER — Other Ambulatory Visit: Payer: Self-pay

## 2020-01-07 ENCOUNTER — Emergency Department (HOSPITAL_COMMUNITY)
Admission: EM | Admit: 2020-01-07 | Discharge: 2020-01-07 | Disposition: A | Discharge status: Home / Self Care | Payer: Medicaid - Out of State | Attending: Emergency Medicine | Admitting: Emergency Medicine

## 2020-01-07 DIAGNOSIS — I10 Essential (primary) hypertension: Secondary | ICD-10-CM | POA: Diagnosis not present

## 2020-01-07 DIAGNOSIS — F419 Anxiety disorder, unspecified: Secondary | ICD-10-CM | POA: Insufficient documentation

## 2020-01-07 DIAGNOSIS — Z20822 Contact with and (suspected) exposure to covid-19: Secondary | ICD-10-CM | POA: Insufficient documentation

## 2020-01-07 DIAGNOSIS — R45851 Suicidal ideations: Secondary | ICD-10-CM

## 2020-01-07 DIAGNOSIS — F329 Major depressive disorder, single episode, unspecified: Secondary | ICD-10-CM | POA: Insufficient documentation

## 2020-01-07 DIAGNOSIS — F17290 Nicotine dependence, other tobacco product, uncomplicated: Secondary | ICD-10-CM | POA: Diagnosis not present

## 2020-01-07 LAB — I-STAT BETA HCG BLOOD, ED (MC, WL, AP ONLY): I-stat hCG, quantitative: <5 m[IU]/mL (ref <5)

## 2020-01-07 LAB — COMPREHENSIVE METABOLIC PANEL
ALT: 9 U/L (ref 0–44)
AST: 13 U/L — ABNORMAL LOW (ref 15–41)
Albumin: 4 g/dL (ref 3.5–5.0)
Alkaline Phosphatase: 46 U/L (ref 38–126)
Anion gap: 13 (ref 5–15)
BUN: 21 mg/dL — ABNORMAL HIGH (ref 6–20)
CO2: 23 mmol/L (ref 22–32)
Calcium: 9.2 mg/dL (ref 8.9–10.3)
Chloride: 104 mmol/L (ref 98–111)
Creatinine, Ser: 0.83 mg/dL (ref 0.44–1.00)
GFR, Estimated: >60 mL/min (ref >60)
Glucose, Bld: 97 mg/dL (ref 70–99)
Potassium: 3.8 mmol/L (ref 3.5–5.1)
Sodium: 140 mmol/L (ref 135–145)
Total Bilirubin: 0.5 mg/dL (ref 0.3–1.2)
Total Protein: 7.9 g/dL (ref 6.5–8.1)

## 2020-01-07 LAB — CBC WITH DIFFERENTIAL/PLATELET
Abs Immature Granulocytes: 0.01 10*3/uL (ref 0.00–0.07)
Basophils Absolute: 0 10*3/uL (ref 0.0–0.1)
Basophils Relative: 0 %
Eosinophils Absolute: 0.2 10*3/uL (ref 0.0–0.5)
Eosinophils Relative: 3 %
HCT: 33.7 % — ABNORMAL LOW (ref 36.0–46.0)
Hemoglobin: 10.3 g/dL — ABNORMAL LOW (ref 12.0–15.0)
Immature Granulocytes: 0 %
Lymphocytes Relative: 28 %
Lymphs Abs: 1.5 10*3/uL (ref 0.7–4.0)
MCH: 25.8 pg — ABNORMAL LOW (ref 26.0–34.0)
MCHC: 30.6 g/dL (ref 30.0–36.0)
MCV: 84.5 fL (ref 80.0–100.0)
Monocytes Absolute: 0.4 10*3/uL (ref 0.1–1.0)
Monocytes Relative: 8 %
Neutro Abs: 3.2 10*3/uL (ref 1.7–7.7)
Neutrophils Relative %: 61 %
Platelets: 323 10*3/uL (ref 150–400)
RBC: 3.99 MIL/uL (ref 3.87–5.11)
RDW: 13.2 % (ref 11.5–15.5)
WBC: 5.3 10*3/uL (ref 4.0–10.5)
nRBC: 0 % (ref 0.0–0.2)

## 2020-01-07 LAB — RAPID URINE DRUG SCREEN, HOSP PERFORMED
Amphetamines: NOT DETECTED
Barbiturates: NOT DETECTED
Benzodiazepines: NOT DETECTED
Cocaine: NOT DETECTED
Opiates: NOT DETECTED
Tetrahydrocannabinol: NOT DETECTED

## 2020-01-07 LAB — ETHANOL: Alcohol, Ethyl (B): <10 mg/dL (ref <10)

## 2020-01-07 LAB — RESPIRATORY PANEL BY RT PCR (FLU A&B, COVID)
Influenza A by PCR: NEGATIVE
Influenza B by PCR: NEGATIVE
SARS Coronavirus 2 by RT PCR: NEGATIVE

## 2020-01-07 LAB — CBG MONITORING, ED: Glucose-Capillary: 102 mg/dL — ABNORMAL HIGH (ref 70–99)

## 2020-01-07 LAB — ACETAMINOPHEN LEVEL: Acetaminophen (Tylenol), Serum: <10 ug/mL — ABNORMAL LOW (ref 10–30)

## 2020-01-07 LAB — SALICYLATE LEVEL: Salicylate Lvl: <7 mg/dL — ABNORMAL LOW (ref 7.0–30.0)

## 2020-01-07 MED ORDER — HYDROGEN PEROXIDE 3 % EX SOLN
CUTANEOUS | Status: AC
  Filled 2020-01-07: qty 473

## 2020-01-07 MED ORDER — BACITRACIN ZINC 500 UNIT/GM EX OINT
TOPICAL_OINTMENT | CUTANEOUS | Status: AC
  Filled 2020-01-07: qty 2.7
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
## 2020-01-07 NOTE — ED Triage Notes (Signed)
Patient brought in by [facility] with c/o of feeling suicidal, patient has superficial lacerations to left forearm, no bleeding at this time. Patient states she would like to seek mental health help. Patient has history of hypertension but is noncompliant with Amlodipine prescription. Patient is AxOx4, calm and cooperative.

## 2020-01-07 NOTE — ED Provider Notes (Signed)
[place] [facility] Provider Note   CSN: [id number] Arrival date & time: 01/07/20  0003     History Chief Complaint  Patient presents with  . Suicidal    Sharon Johnston is a 29 y.o. female.  Patient presents to the emergency department with a chief complaint of depression and anxiety.  She states that she was brought in by the ambulance because she was cutting her left arm.  Per [employer], IVC papers are being taken out on the patient by her aunt.  Patient denies any alcohol or drug use.  She denies any recent illnesses.  She states that she was cutting her left arm because of her anxiety, but not with intent to kill herself.  The history is provided by the patient. No language interpreter was used.       Past Medical History:  Diagnosis Date  . Chlamydia   . Depression    hospitalized for 2 weeks after suicide att was post partum  . Hx of gonorrhea   . Hypertension    gestational HTN  . Kidney infection     Patient Active Problem List   Diagnosis Date Noted  . Insertion of implantable subdermal contraceptive 06/22/2017  . Chronic hypertension 10/31/2015  . Depression 06/15/2013    Past Surgical History:  Procedure Laterality Date  . NO PAST SURGERIES       OB History    Gravida  7   Para  4   Term  4   Preterm  0   AB  2   Living  4     SAB  1   TAB  1   Ectopic  0   Multiple  0   Live Births  4           Family History  Problem Relation Age of Onset  . Hypertension Mother   . Hypertension Maternal Grandmother   . Healthy Father   . Hypertension Maternal Aunt   . Depression Cousin     Social History   Tobacco Use  . Smoking status: Current Some Day Smoker    Packs/day: 0.25    Types: Cigars  . Smokeless tobacco: Never Used  . Tobacco comment: two black and milds per day  Vaping Use  . Vaping Use: Never used  Substance Use Topics  . Alcohol use: No  . Drug use: No    Home Medications Prior to  Admission medications   Medication Sig Start Date End Date Taking? Authorizing Provider  benzonatate (TESSALON) 100 MG capsule Take 1 capsule (100 mg total) by mouth every 8 (eight) hours. 01/02/20   Caccavale, Sophia, PA-C  ibuprofen (ADVIL) 600 MG tablet Take 1 tablet (600 mg total) by mouth every 6 (six) hours as needed for up to 30 doses for moderate pain or cramping. Patient not taking: Reported on 11/06/2019 11/05/19   Nugent, Odie Sera, NP  oxyCODONE (ROXICODONE) 5 MG immediate release tablet Take 1 tablet (5 mg total) by mouth every 8 (eight) hours as needed for breakthrough pain. Patient not taking: Reported on 11/06/2019 11/05/19 11/04/20  Nugent, Odie Sera, NP  predniSONE (DELTASONE) 20 MG tablet Take 2 tablets (40 mg total) by mouth daily for 4 days. 01/03/20 01/07/20  Caccavale, Sophia, PA-C  promethazine (PHENERGAN) 12.5 MG tablet Take 1 tablet (12.5 mg total) by mouth every 6 (six) hours as needed for nausea or vomiting. Patient not taking: Reported on 11/06/2019 11/05/19   Nugent, Odie Sera, NP  promethazine-dextromethorphan (PROMETHAZINE-DM) 6.25-15 MG/5ML syrup Take 5 mLs by mouth 4 (four) times daily as needed for cough. 01/02/20   Caccavale, Sophia, PA-C  ferrous sulfate 325 (65 FE) MG tablet Take 1 tablet (325 mg total) by mouth 2 (two) times daily with a meal. 04/25/17 11/01/18  Pincus Large, DO  medroxyPROGESTERone (DEPO-PROVERA) 150 MG/ML injection Inject 1 mL (150 mg total) into the muscle every 3 (three) months. 07/26/17 04/22/19  Adam Phenix, MD    Allergies    Patient has no known allergies.  Review of Systems   Review of Systems  All other systems reviewed and are negative.   Physical Exam Updated Vital Signs BP (!) 194/113 (BP Location: Right Arm)   Pulse 81   Temp 98.7 F (37.1 C) (Oral)   Resp 16   Ht 5\' 2"  (1.575 m)   Wt 56.7 kg   LMP 09/04/2019 Comment: sometime last month  SpO2 100%   BMI 22.86 kg/m   Physical Exam Vitals and nursing note reviewed.    Constitutional:      General: She is not in acute distress.    Appearance: She is well-developed.  HENT:     Head: Normocephalic and atraumatic.  Eyes:     Conjunctiva/sclera: Conjunctivae normal.  Cardiovascular:     Rate and Rhythm: Normal rate and regular rhythm.     Heart sounds: No murmur heard.   Pulmonary:     Effort: Pulmonary effort is normal. No respiratory distress.     Breath sounds: Normal breath sounds.  Abdominal:     Palpations: Abdomen is soft.     Tenderness: There is no abdominal tenderness.  Musculoskeletal:     Cervical back: Neck supple.  Skin:    General: Skin is warm and dry.     Comments: Shallow scrapes to left anterior forearm  Neurological:     Mental Status: She is alert and oriented to person, place, and time.  Psychiatric:        Mood and Affect: Mood normal.        Behavior: Behavior normal.     ED Results / Procedures / Treatments   Labs (all labs ordered are listed, but only abnormal results are displayed) Labs Reviewed - No data to display  EKG None  Radiology No results found.  Procedures Procedures (including critical care time)  Medications Ordered in ED Medications - No data to display  ED Course  I have reviewed the triage vital signs and the nursing notes.  Pertinent labs & imaging results that were available during my care of the patient were reviewed by me and considered in my medical decision making (see chart for details).    MDM Rules/Calculators/A&P                          Patient with anxiety and depression.  Was cutting herself to help cope.  Per [employer], IVC papers taken out by aunt.  Patient appears stable for TTS evaluation.   Final Clinical Impression(s) / ED Diagnoses Final diagnoses:  None    Rx / DC Orders ED Discharge Orders    None       09/06/2019, PA-C 01/07/20 01/09/20    [id number], MD 01/07/20 [phone]

## 2020-01-07 NOTE — ED Notes (Signed)
Assumed care of patient from [place], [place]. Pt changed into scrubs. 1 bag of belongings stored in cabinet 29.

## 2020-01-07 NOTE — ED Notes (Signed)
Pt DC d off unit to home per provider. Pr calm, cooperative, no s/s of distress. DC information given to pt, pt acknowledged understanding. Belongings given to pt. Pt ambulatory off unit , escorted by NT. Pt transported by family.

## 2020-01-07 NOTE — ED Notes (Signed)
LUNCH TRAY GIVEN. 

## 2020-01-07 NOTE — ED Provider Notes (Addendum)
Patient seen by me this morning.  Patient has been cleared by behavioral health for discharge home.  Patient offered outpatient referrals but she initially refused but information provided.  Patient initially evaluated them for suicidal thoughts.  But she has been cleared by them.   Sharon Mulders, MD 01/07/20 1253  IVC was rescinded    Sharon Mulders, MD 01/07/20 1317

## 2020-01-07 NOTE — ED Notes (Signed)
BREAKFAST TRAY GIVEN 

## 2020-01-07 NOTE — Discharge Instructions (Signed)
You are encouraged to follow up with [facility] for outpatient treatment.  [facility] - outpatient services [address]. [place], [place] [phone]

## 2020-01-18 ENCOUNTER — Encounter (HOSPITAL_COMMUNITY): Payer: Self-pay | Admitting: *Deleted

## 2020-01-18 ENCOUNTER — Other Ambulatory Visit: Payer: Self-pay

## 2020-01-18 ENCOUNTER — Ambulatory Visit (INDEPENDENT_AMBULATORY_CARE_PROVIDER_SITE_OTHER): Payer: Medicaid - Out of State

## 2020-01-18 ENCOUNTER — Emergency Department (HOSPITAL_COMMUNITY)
Admission: EM | Admit: 2020-01-18 | Discharge: 2020-01-18 | Disposition: A | Discharge status: Home / Self Care | Payer: Medicaid - Out of State | Attending: Emergency Medicine | Admitting: Emergency Medicine

## 2020-01-18 ENCOUNTER — Encounter (HOSPITAL_COMMUNITY): Payer: Self-pay

## 2020-01-18 ENCOUNTER — Ambulatory Visit (HOSPITAL_COMMUNITY)
Admission: EM | Admit: 2020-01-18 | Discharge: 2020-01-18 | Disposition: A | Discharge status: Home / Self Care | Payer: Medicaid - Out of State | Attending: Internal Medicine | Admitting: Internal Medicine

## 2020-01-18 ENCOUNTER — Emergency Department (HOSPITAL_COMMUNITY): Payer: Medicaid - Out of State

## 2020-01-18 DIAGNOSIS — Z3202 Encounter for pregnancy test, result negative: Secondary | ICD-10-CM | POA: Insufficient documentation

## 2020-01-18 DIAGNOSIS — S6391XA Sprain of unspecified part of right wrist and hand, initial encounter: Secondary | ICD-10-CM | POA: Diagnosis present

## 2020-01-18 DIAGNOSIS — Z5321 Procedure and treatment not carried out due to patient leaving prior to being seen by health care provider: Secondary | ICD-10-CM | POA: Diagnosis not present

## 2020-01-18 DIAGNOSIS — M25531 Pain in right wrist: Secondary | ICD-10-CM | POA: Diagnosis present

## 2020-01-18 DIAGNOSIS — Y92410 Unspecified street and highway as the place of occurrence of the external cause: Secondary | ICD-10-CM | POA: Insufficient documentation

## 2020-01-18 DIAGNOSIS — W19XXXA Unspecified fall, initial encounter: Secondary | ICD-10-CM

## 2020-01-18 DIAGNOSIS — T1490XA Injury, unspecified, initial encounter: Secondary | ICD-10-CM

## 2020-01-18 DIAGNOSIS — S61216A Laceration without foreign body of right little finger without damage to nail, initial encounter: Secondary | ICD-10-CM | POA: Diagnosis present

## 2020-01-18 DIAGNOSIS — R079 Chest pain, unspecified: Secondary | ICD-10-CM

## 2020-01-18 DIAGNOSIS — M79641 Pain in right hand: Secondary | ICD-10-CM

## 2020-01-18 DIAGNOSIS — R0781 Pleurodynia: Secondary | ICD-10-CM

## 2020-01-18 DIAGNOSIS — M79601 Pain in right arm: Secondary | ICD-10-CM | POA: Insufficient documentation

## 2020-01-18 DIAGNOSIS — Z113 Encounter for screening for infections with a predominantly sexual mode of transmission: Secondary | ICD-10-CM

## 2020-01-18 DIAGNOSIS — F17210 Nicotine dependence, cigarettes, uncomplicated: Secondary | ICD-10-CM | POA: Diagnosis not present

## 2020-01-18 LAB — POC URINE PREG, ED: Preg Test, Ur: NEGATIVE

## 2020-01-18 MED ORDER — HYDROCODONE-ACETAMINOPHEN 5-325 MG PO TABS
1.0000 | ORAL_TABLET | Freq: Four times a day (QID) | ORAL | 0 refills | Status: AC | PRN
Start: 2020-01-18 — End: 2020-01-21

## 2020-01-18 MED ORDER — IBUPROFEN 600 MG PO TABS: 600.0000 mg | ORAL_TABLET | Freq: Four times a day (QID) | ORAL | 0 refills | Status: DC | PRN

## 2020-01-18 MED ORDER — CYCLOBENZAPRINE HCL 10 MG PO TABS: 10.0000 mg | ORAL_TABLET | Freq: Two times a day (BID) | ORAL | 0 refills | Status: DC | PRN

## 2020-01-18 NOTE — ED Triage Notes (Signed)
Pt would like STD check and pregnancy test done as well.

## 2020-01-18 NOTE — ED Notes (Signed)
Pt said she was not going to stay and wait.

## 2020-01-18 NOTE — Discharge Instructions (Addendum)
Your xrays are negative for fractures or misalignments (hands and wrists) I will call you if your rib xray shows any fractures or misalignements  I have sent in ibuprofen for you to take on tablet every 8 hours as needed for pain and inflammation  I have sent in Norco for you to take one tablet every 6 hours as needed for breakthrough pain  I have sent in cyclobenzaprine for you to take twice daily as needed for muscle spasms   We have placed a brace to your right wrist  Follow up with orthopedics if symptoms

## 2020-01-18 NOTE — ED Notes (Signed)
Pt provided with ice water upon request of Pt.
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
## 2020-01-20 LAB — CERVICOVAGINAL ANCILLARY ONLY
Bacterial Vaginitis (gardnerella): POSITIVE — AB
Candida Glabrata: NEGATIVE
Candida Vaginitis: POSITIVE — AB
Chlamydia: NEGATIVE
Comment: NEGATIVE
Comment: NEGATIVE
Comment: NEGATIVE
Comment: NEGATIVE
Comment: NEGATIVE
Comment: NORMAL
Neisseria Gonorrhea: NEGATIVE
Trichomonas: NEGATIVE

## 2020-01-21 ENCOUNTER — Telehealth (HOSPITAL_COMMUNITY): Payer: Self-pay | Admitting: Emergency Medicine

## 2020-01-21 MED ORDER — METRONIDAZOLE 500 MG PO TABS: 500.0000 mg | ORAL_TABLET | Freq: Two times a day (BID) | ORAL | 0 refills | Status: DC

## 2020-01-21 MED ORDER — FLUCONAZOLE 150 MG PO TABS: 150.0000 mg | ORAL_TABLET | Freq: Once | ORAL | 0 refills | Status: AC

## 2020-03-18 ENCOUNTER — Other Ambulatory Visit: Payer: Self-pay

## 2020-03-18 ENCOUNTER — Ambulatory Visit (HOSPITAL_COMMUNITY)
Admission: EM | Admit: 2020-03-18 | Discharge: 2020-03-18 | Disposition: A | Discharge status: Home / Self Care | Payer: Self-pay | Attending: Family Medicine | Admitting: Family Medicine

## 2020-03-18 ENCOUNTER — Inpatient Hospital Stay (HOSPITAL_COMMUNITY): Admission: RE | Admit: 2020-03-18 | Payer: PRIVATE HEALTH INSURANCE | Source: Ambulatory Visit

## 2020-03-18 ENCOUNTER — Encounter (HOSPITAL_COMMUNITY): Payer: Self-pay | Admitting: Emergency Medicine

## 2020-03-18 DIAGNOSIS — R300 Dysuria: Secondary | ICD-10-CM | POA: Insufficient documentation

## 2020-03-18 DIAGNOSIS — R030 Elevated blood-pressure reading, without diagnosis of hypertension: Secondary | ICD-10-CM | POA: Diagnosis present

## 2020-03-18 DIAGNOSIS — R350 Frequency of micturition: Secondary | ICD-10-CM | POA: Diagnosis not present

## 2020-03-18 LAB — POCT URINALYSIS DIPSTICK, ED / UC
Bilirubin Urine: NEGATIVE
Glucose, UA: NEGATIVE mg/dL
Ketones, ur: NEGATIVE mg/dL
Nitrite: NEGATIVE
Protein, ur: NEGATIVE mg/dL
Specific Gravity, Urine: 1.025 (ref 1.005–1.030)
Urobilinogen, UA: 1 mg/dL (ref 0.0–1.0)
pH: 7 (ref 5.0–8.0)

## 2020-03-18 MED ORDER — CEPHALEXIN 500 MG PO CAPS: 500.0000 mg | ORAL_CAPSULE | Freq: Two times a day (BID) | ORAL | 0 refills | Status: DC

## 2020-03-18 NOTE — ED Provider Notes (Signed)
[facility]   [id number] 03/18/20 Arrival Time: 1058  ASSESSMENT & PLAN:  1. Urinary frequency   2. Dysuria   3. Elevated blood pressure reading without diagnosis of hypertension    Will treat for possible bladder infection.  Meds ordered this encounter  Medications  . cephALEXin (KEFLEX) 500 MG capsule    Sig: Take 1 capsule (500 mg total) by mouth 2 (two) times daily.    Dispense:  10 capsule    Refill:  0   Awaiting vaginal cytology results.   Discharge Instructions     We have sent testing for sexually transmitted infections along with a urine culture. We will notify you of any positive results once they are received. If required, we will prescribe any medications you might need.  Please refrain from all sexual activity for at least the next seven days.  Your blood pressure was noted to be elevated during your visit today. If you are currently taking medication for high blood pressure, please ensure you are taking this as directed. If you do not have a history of high blood pressure and your blood pressure remains persistently elevated, you may need to begin taking a medication at some point. You may return here within the next few days to recheck if unable to see your primary care provider or if do not have a one.  BP (S) (!) 148/103 (BP Location: Left Arm)   Pulse 70   Temp 98.6 F (37 C) (Oral)   Resp 16   LMP 03/04/2020 Comment: sometime last month  SpO2 97%      Without s/s of PID.  Labs Reviewed  POCT URINALYSIS DIPSTICK, ED / UC - Abnormal; Notable for the following components:      Result Value   Hgb urine dipstick SMALL (*)    Leukocytes,Ua TRACE (*)    All other components within normal limits  URINE CULTURE  CERVICOVAGINAL ANCILLARY ONLY    Will notify of any positive results. Instructed to refrain from sexual activity for at least seven days.  Reviewed expectations re: course of current medical issues. Questions answered. Outlined  signs and symptoms indicating need for more acute intervention. Patient verbalized understanding. After Visit Summary given.   SUBJECTIVE:  Sharon Johnston is a 29 y.o. female who presents with complaint of mild dysuria and urinary frequency over the past couple of days. Today noted that ankles were mildly swollen upon waking; now better. No calf pain or swelling. No specific vaginal discharge reported. Afebrile. No abd or pelvic pain reported. No genital rashes or lesions. Reports that she is sexually active.. OTC treatment: none.  Patient's last menstrual period was 03/04/2020.  Increased blood pressure noted today. Reports that she is not treated for HTN. She reports no chest pain on exertion, no dyspnea on exertion, no orthostatic dizziness or lightheadedness, no orthopnea or paroxysmal nocturnal dyspnea and no palpitations.   OBJECTIVE:  Vitals:   03/18/20 1311  BP: (S) (!) 148/103  Pulse: 70  Resp: 16  Temp: 98.6 F (37 C)  TempSrc: Oral  SpO2: 97%     General appearance: alert, cooperative, appears stated age and no distress Lungs: unlabored respirations; speaks full sentences without difficulty Back: no CVA tenderness; FROM at waist Abdomen: soft, non-tender GU: deferred Ext: no edema Skin: warm and dry Psychological: alert and cooperative; normal mood and affect.  Results for orders placed or performed during the hospital encounter of 03/18/20  POC Urinalysis dipstick  Result Value Ref Range  Glucose, UA NEGATIVE NEGATIVE mg/dL   Bilirubin Urine NEGATIVE NEGATIVE   Ketones, ur NEGATIVE NEGATIVE mg/dL   Specific Gravity, Urine 1.025 1.005 - 1.030   Hgb urine dipstick SMALL (A) NEGATIVE   pH 7.0 5.0 - 8.0   Protein, ur NEGATIVE NEGATIVE mg/dL   Urobilinogen, UA 1.0 0.0 - 1.0 mg/dL   Nitrite NEGATIVE NEGATIVE   Leukocytes,Ua TRACE (A) NEGATIVE    Labs Reviewed  POCT URINALYSIS DIPSTICK, ED / UC - Abnormal; Notable for the following components:      Result  Value   Hgb urine dipstick SMALL (*)    Leukocytes,Ua TRACE (*)    All other components within normal limits  URINE CULTURE  CERVICOVAGINAL ANCILLARY ONLY    No Known Allergies  Past Medical History:  Diagnosis Date  . Chlamydia   . Depression    hospitalized for 2 weeks after suicide att was post partum  . Hx of gonorrhea   . Hypertension    gestational HTN  . Kidney infection    Family History  Problem Relation Age of Onset  . Hypertension Mother   . Hypertension Maternal Grandmother   . Healthy Father   . Hypertension Maternal Aunt   . Depression Cousin    Social History   Socioeconomic History  . Marital status: Single    Spouse name: Not on file  . Number of children: Not on file  . Years of education: Not on file  . Highest education level: Not on file  Occupational History  . Not on file  Tobacco Use  . Smoking status: Current Some Day Smoker    Packs/day: 0.25    Types: Cigars  . Smokeless tobacco: Never Used  . Tobacco comment: two black and milds per day  Vaping Use  . Vaping Use: Never used  Substance and Sexual Activity  . Alcohol use: No  . Drug use: No  . Sexual activity: Yes    Birth control/protection: None  Other Topics Concern  . Not on file  Social History Narrative   ** Merged History Encounter **       Social Determinants of Health   Financial Resource Strain: Not on file  Food Insecurity: Not on file  Transportation Needs: Not on file  Physical Activity: Not on file  Stress: Not on file  Social Connections: Not on file  Intimate Partner Violence: Not on file          Mardella Layman, MD 03/18/20 1342

## 2020-03-18 NOTE — ED Triage Notes (Signed)
PT C/O: bilateral leg swelling onset this am when she woke up  No edema visible and pedal pulses are +2  Reports hx of HTN and has not taken BP meds in over a year.   Also c/o UTI sx onset 2 days associated w/dysuria   BP today = 148/103 sitting down  DENIES: f/v/n/d, chest pains  TAKING MEDS: none  A&O x4... NAD... Ambulatory

## 2020-03-18 NOTE — Discharge Instructions (Addendum)
We have sent testing for sexually transmitted infections along with a urine culture. We will notify you of any positive results once they are received. If required, we will prescribe any medications you might need.  Please refrain from all sexual activity for at least the next seven days.  Your blood pressure was noted to be elevated during your visit today. If you are currently taking medication for high blood pressure, please ensure you are taking this as directed. If you do not have a history of high blood pressure and your blood pressure remains persistently elevated, you may need to begin taking a medication at some point. You may return here within the next few days to recheck if unable to see your primary care provider or if do not have a one.  BP (S) (!) 148/103 (BP Location: Left Arm)   Pulse 70   Temp 98.6 F (37 C) (Oral)   Resp 16   LMP 03/04/2020 Comment: sometime last month  SpO2 97%

## 2020-03-19 LAB — CERVICOVAGINAL ANCILLARY ONLY
Bacterial Vaginitis (gardnerella): POSITIVE — AB
Candida Glabrata: NEGATIVE
Candida Vaginitis: NEGATIVE
Chlamydia: NEGATIVE
Comment: NEGATIVE
Comment: NEGATIVE
Comment: NEGATIVE
Comment: NEGATIVE
Comment: NEGATIVE
Comment: NORMAL
Neisseria Gonorrhea: NEGATIVE
Trichomonas: NEGATIVE

## 2020-03-20 ENCOUNTER — Telehealth (HOSPITAL_COMMUNITY): Payer: Self-pay | Admitting: Emergency Medicine

## 2020-03-20 LAB — URINE CULTURE: Culture: 30000 — AB

## 2020-03-20 MED ORDER — METRONIDAZOLE 500 MG PO TABS: 500.0000 mg | ORAL_TABLET | Freq: Two times a day (BID) | ORAL | 0 refills | Status: DC

## 2020-03-29 ENCOUNTER — Emergency Department (HOSPITAL_COMMUNITY)
Admission: EM | Admit: 2020-03-29 | Discharge: 2020-03-29 | Disposition: A | Discharge status: Home / Self Care | Payer: Medicaid - Out of State | Attending: Emergency Medicine | Admitting: Emergency Medicine

## 2020-03-29 ENCOUNTER — Other Ambulatory Visit: Payer: Self-pay

## 2020-03-29 ENCOUNTER — Encounter (HOSPITAL_COMMUNITY): Payer: Self-pay

## 2020-03-29 DIAGNOSIS — M791 Myalgia, unspecified site: Secondary | ICD-10-CM | POA: Diagnosis present

## 2020-03-29 DIAGNOSIS — F17290 Nicotine dependence, other tobacco product, uncomplicated: Secondary | ICD-10-CM | POA: Insufficient documentation

## 2020-03-29 DIAGNOSIS — I10 Essential (primary) hypertension: Secondary | ICD-10-CM | POA: Diagnosis not present

## 2020-03-29 DIAGNOSIS — Z20822 Contact with and (suspected) exposure to covid-19: Secondary | ICD-10-CM | POA: Insufficient documentation

## 2020-03-29 DIAGNOSIS — Z79899 Other long term (current) drug therapy: Secondary | ICD-10-CM | POA: Insufficient documentation

## 2020-03-29 NOTE — Discharge Instructions (Addendum)
Person Under Monitoring Name: Sharon Johnston  Location: [address] [place] [address]   Infection Prevention Recommendations for Individuals Confirmed to have, or Being Evaluated for, 2019 Novel Coronavirus (COVID-19) Infection Who Receive Care at Home  Individuals who are confirmed to have, or are being evaluated for, COVID-19 should follow the prevention steps below until a healthcare provider or local or state health department says they can return to normal activities.  Stay home except to get medical care You should restrict activities outside your home, except for getting medical care. Do not go to work, school, or public areas, and do not use public transportation or taxis.  Call ahead before visiting your doctor Before your medical appointment, call the healthcare provider and tell them that you have, or are being evaluated for, COVID-19 infection. This will help the healthcare provider's office take steps to keep other people from getting infected. Ask your healthcare provider to call the local or state health department.  Monitor your symptoms Seek prompt medical attention if your illness is worsening (e.g., difficulty breathing). Before going to your medical appointment, call the healthcare provider and tell them that you have, or are being evaluated for, COVID-19 infection. Ask your healthcare provider to call the local or state health department.  Wear a facemask You should wear a facemask that covers your nose and mouth when you are in the same room with other people and when you visit a healthcare provider. People who live with or visit you should also wear a facemask while they are in the same room with you.  Separate yourself from other people in your home As much as possible, you should stay in a different room from other people in your home. Also, you should use a separate bathroom, if available.  Avoid sharing household items You should not  share dishes, drinking glasses, cups, eating utensils, towels, bedding, or other items with other people in your home. After using these items, you should wash them thoroughly with soap and water.  Cover your coughs and sneezes Cover your mouth and nose with a tissue when you cough or sneeze, or you can cough or sneeze into your sleeve. Throw used tissues in a lined trash can, and immediately wash your hands with soap and water for at least 20 seconds or use an alcohol-based hand rub.  Wash your [employer] your hands often and thoroughly with soap and water for at least 20 seconds. You can use an alcohol-based hand sanitizer if soap and water are not available and if your hands are not visibly dirty. Avoid touching your eyes, nose, and mouth with unwashed hands.   Prevention Steps for Caregivers and Household Members of Individuals Confirmed to have, or Being Evaluated for, COVID-19 Infection Being Cared for in the Home  If you live with, or provide care at home for, a person confirmed to have, or being evaluated for, COVID-19 infection please follow these guidelines to prevent infection:  Follow healthcare provider's instructions Make sure that you understand and can help the patient follow any healthcare provider instructions for all care.  Provide for the patient's basic needs You should help the patient with basic needs in the home and provide support for getting groceries, prescriptions, and other personal needs.  Monitor the patient's symptoms If they are getting sicker, call his or her medical provider and tell them that the patient has, or is being evaluated for, COVID-19 infection. This will help the healthcare provider's office  take steps to keep other people from getting infected. Ask the healthcare provider to call the local or state health department.  Limit the number of people who have contact with the patient If possible, have only one caregiver for the  patient. Other household members should stay in another home or place of residence. If this is not possible, they should stay in another room, or be separated from the patient as much as possible. Use a separate bathroom, if available. Restrict visitors who do not have an essential need to be in the home.  Keep older adults, very young children, and other sick people away from the patient Keep older adults, very young children, and those who have compromised immune systems or chronic health conditions away from the patient. This includes people with chronic heart, lung, or kidney conditions, diabetes, and cancer.  Ensure good ventilation Make sure that shared spaces in the home have good air flow, such as from an air conditioner or an opened window, weather permitting.  Wash your hands often Wash your hands often and thoroughly with soap and water for at least 20 seconds. You can use an alcohol based hand sanitizer if soap and water are not available and if your hands are not visibly dirty. Avoid touching your eyes, nose, and mouth with unwashed hands. Use disposable paper towels to dry your hands. If not available, use dedicated cloth towels and replace them when they become wet.  Wear a facemask and gloves Wear a disposable facemask at all times in the room and gloves when you touch or have contact with the patient's blood, body fluids, and/or secretions or excretions, such as sweat, saliva, sputum, nasal mucus, vomit, urine, or feces.  Ensure the mask fits over your nose and mouth tightly, and do not touch it during use. Throw out disposable facemasks and gloves after using them. Do not reuse. Wash your hands immediately after removing your facemask and gloves. If your personal clothing becomes contaminated, carefully remove clothing and launder. Wash your hands after handling contaminated clothing. Place all used disposable facemasks, gloves, and other waste in a lined container before  disposing them with other household waste. Remove gloves and wash your hands immediately after handling these items.  Do not share dishes, glasses, or other household items with the patient Avoid sharing household items. You should not share dishes, drinking glasses, cups, eating utensils, towels, bedding, or other items with a patient who is confirmed to have, or being evaluated for, COVID-19 infection. After the person uses these items, you should wash them thoroughly with soap and water.  Wash laundry thoroughly Immediately remove and wash clothes or bedding that have blood, body fluids, and/or secretions or excretions, such as sweat, saliva, sputum, nasal mucus, vomit, urine, or feces, on them. Wear gloves when handling laundry from the patient. Read and follow directions on labels of laundry or clothing items and detergent. In general, wash and dry with the warmest temperatures recommended on the label.  Clean all areas the individual has used often Clean all touchable surfaces, such as counters, tabletops, doorknobs, bathroom fixtures, toilets, phones, keyboards, tablets, and bedside tables, every day. Also, clean any surfaces that may have blood, body fluids, and/or secretions or excretions on them. Wear gloves when cleaning surfaces the patient has come in contact with. Use a diluted bleach solution (e.g., dilute bleach with 1 part bleach and 10 parts water) or a household disinfectant with a label that says EPA-registered for coronaviruses. To make a bleach  solution at home, add 1 tablespoon of bleach to 1 quart (4 cups) of water. For a larger supply, add  cup of bleach to 1 gallon (16 cups) of water. Read labels of cleaning products and follow recommendations provided on product labels. Labels contain instructions for safe and effective use of the cleaning product including precautions you should take when applying the product, such as wearing gloves or eye protection and making sure you  have good ventilation during use of the product. Remove gloves and wash hands immediately after cleaning.  Monitor yourself for signs and symptoms of illness Caregivers and household members are considered close contacts, should monitor their health, and will be asked to limit movement outside of the home to the extent possible. Follow the monitoring steps for close contacts listed on the symptom monitoring form.   ? If you have additional questions, contact your local health department or call the epidemiologist on call at [phone] (available 24/7). ? This guidance is subject to change. For the most up-to-date guidance from [facility], please refer to their website: [email or web address]

## 2020-03-29 NOTE — ED Provider Notes (Signed)
[facility] EMERGENCY DEPARTMENT Provider Note   CSN: [id number] Arrival date & time: 03/29/20  1007     History Chief Complaint  Patient presents with  . Generalized Body Aches    [name] is a 30 y.o. female.  Pt presents to the ED today with generalized body aches and feeling hot and cold.  She has been exposed to someone with Covid.  She had 1 dose of the vaccine, but did not go back for the 2nd.          Past Medical History:  Diagnosis Date  . Chlamydia   . Depression    hospitalized for 2 weeks after suicide att was post partum  . Hx of gonorrhea   . Hypertension    gestational HTN  . Kidney infection     Patient Active Problem List   Diagnosis Date Noted  . Insertion of implantable subdermal contraceptive 06/22/2017  . Chronic hypertension 10/31/2015  . Depression 06/15/2013    Past Surgical History:  Procedure Laterality Date  . NO PAST SURGERIES       OB History    Gravida  7   Para  4   Term  4   Preterm  0   AB  2   Living  4     SAB  1   IAB  1   Ectopic  0   Multiple  0   Live Births  4           Family History  Problem Relation Age of Onset  . Hypertension Mother   . Hypertension Maternal Grandmother   . Healthy Father   . Hypertension Maternal Aunt   . Depression Cousin     Social History   Tobacco Use  . Smoking status: Current Some Day Smoker    Packs/day: 0.25    Types: Cigars  . Smokeless tobacco: Never Used  . Tobacco comment: two black and milds per day  Vaping Use  . Vaping Use: Never used  Substance Use Topics  . Alcohol use: No  . Drug use: No    Home Medications Prior to Admission medications   Medication Sig Start Date End Date Taking? Authorizing Provider  amLODipine (NORVASC) 10 MG tablet Take 10 mg by mouth daily.    [provider]  benzonatate (TESSALON) 100 MG capsule Take 1 capsule (100 mg total) by mouth every 8 (eight) hours. Patient not taking: No sig reported  01/02/20   Caccavale, Sophia, PA-C  cephALEXin (KEFLEX) 500 MG capsule Take 1 capsule (500 mg total) by mouth 2 (two) times daily. 03/18/20   Mardella Layman, MD  cyclobenzaprine (FLEXERIL) 10 MG tablet Take 1 tablet (10 mg total) by mouth 2 (two) times daily as needed for muscle spasms. 01/18/20   Moshe Cipro, NP  ibuprofen (ADVIL) 600 MG tablet Take 1 tablet (600 mg total) by mouth every 6 (six) hours as needed. 01/18/20   Moshe Cipro, NP  metroNIDAZOLE (FLAGYL) 500 MG tablet Take 1 tablet (500 mg total) by mouth 2 (two) times daily. 03/20/20   Lamptey, Britta Mccreedy, MD  oxyCODONE (ROXICODONE) 5 MG immediate release tablet Take 1 tablet (5 mg total) by mouth every 8 (eight) hours as needed for breakthrough pain. Patient not taking: No sig reported 11/05/19 11/04/20  Nugent, Odie Sera, NP  promethazine (PHENERGAN) 12.5 MG tablet Take 1 tablet (12.5 mg total) by mouth every 6 (six) hours as needed for nausea or vomiting. Patient not taking: No  sig reported 11/05/19   Nugent, Odie Sera, NP  promethazine-dextromethorphan (PROMETHAZINE-DM) 6.25-15 MG/5ML syrup Take 5 mLs by mouth 4 (four) times daily as needed for cough. 01/02/20   Caccavale, Sophia, PA-C  ferrous sulfate 325 (65 FE) MG tablet Take 1 tablet (325 mg total) by mouth 2 (two) times daily with a meal. 04/25/17 11/01/18  Pincus Large, DO  medroxyPROGESTERone (DEPO-PROVERA) 150 MG/ML injection Inject 1 mL (150 mg total) into the muscle every 3 (three) months. 07/26/17 04/22/19  Adam Phenix, MD    Allergies    Patient has no known allergies.  Review of Systems   Review of Systems  Constitutional: Positive for chills and fatigue.  Musculoskeletal: Positive for myalgias.  All other systems reviewed and are negative.   Physical Exam Updated Vital Signs BP (!) 158/109 (BP Location: Right Arm)   Pulse 78   Temp 98.3 F (36.8 C) (Oral)   Resp 18   Ht 5\' 2"  (1.575 m)   Wt 56.7 kg   LMP 03/04/2020 Comment: sometime last month   SpO2 100%   BMI 22.86 kg/m   Physical Exam Vitals and nursing note reviewed.  Constitutional:      Appearance: Normal appearance.  HENT:     Head: Normocephalic and atraumatic.     Right Ear: External ear normal.     Left Ear: External ear normal.     Nose: Nose normal.     Mouth/Throat:     Mouth: Mucous membranes are moist.     Pharynx: Oropharynx is clear.  Eyes:     Extraocular Movements: Extraocular movements intact.     Conjunctiva/sclera: Conjunctivae normal.     Pupils: Pupils are equal, round, and reactive to light.  Cardiovascular:     Rate and Rhythm: Normal rate and regular rhythm.     Pulses: Normal pulses.     Heart sounds: Normal heart sounds.  Pulmonary:     Effort: Pulmonary effort is normal.     Breath sounds: Normal breath sounds.  Abdominal:     General: Abdomen is flat. Bowel sounds are normal.     Palpations: Abdomen is soft.  Musculoskeletal:        General: Normal range of motion.     Cervical back: Normal range of motion and neck supple.  Skin:    General: Skin is warm.     Capillary Refill: Capillary refill takes less than 2 seconds.  Neurological:     General: No focal deficit present.     Mental Status: She is alert and oriented to person, place, and time.  Psychiatric:        Mood and Affect: Mood normal.        Behavior: Behavior normal.     ED Results / Procedures / Treatments   Labs (all labs ordered are listed, but only abnormal results are displayed) Labs Reviewed  SARS CORONAVIRUS 2 (TAT 6-24 HRS)    EKG None  Radiology No results found.  Procedures Procedures (including critical care time)  Medications Ordered in ED Medications - No data to display  ED Course  I have reviewed the triage vital signs and the nursing notes.  Pertinent labs & imaging results that were available during my care of the patient were reviewed by me and considered in my medical decision making (see chart for details).    MDM  Rules/Calculators/A&P  Pt likely has Covid.  She has very minimal sx, so no further work up needed other than a swab.  Pt will be swabbed and d/c home.  Home isolation orders given.  She knows to return if worse.    Sharon Johnston was evaluated in Emergency Department on 03/29/2020 for the symptoms described in the history of present illness. She was evaluated in the context of the global COVID-19 pandemic, which necessitated consideration that the patient might be at risk for infection with the SARS-CoV-2 virus that causes COVID-19. Institutional protocols and algorithms that pertain to the evaluation of patients at risk for COVID-19 are in a state of rapid change based on information released by regulatory bodies including the CDC and federal and state organizations. These policies and algorithms were followed during the patient's care in the ED. Final Clinical Impression(s) / ED Diagnoses Final diagnoses:  Suspected COVID-19 virus infection    Rx / DC Orders ED Discharge Orders    None       Jacalyn Lefevre, MD 03/29/20 1129

## 2020-03-29 NOTE — ED Triage Notes (Signed)
Pt presents to ED with complaints of generalized body aches, sore throat and feeling hot started yesterday.

## 2020-03-30 LAB — SARS CORONAVIRUS 2 (TAT 6-24 HRS): SARS Coronavirus 2: NEGATIVE

## 2020-04-24 ENCOUNTER — Other Ambulatory Visit: Payer: Self-pay

## 2020-04-24 ENCOUNTER — Inpatient Hospital Stay (HOSPITAL_COMMUNITY)
Admission: AD | Admit: 2020-04-24 | Discharge: 2020-04-24 | Disposition: A | Discharge status: Home / Self Care | Payer: Medicaid - Out of State | Attending: Obstetrics & Gynecology | Admitting: Obstetrics & Gynecology

## 2020-04-24 DIAGNOSIS — R252 Cramp and spasm: Secondary | ICD-10-CM | POA: Diagnosis present

## 2020-04-24 DIAGNOSIS — Z3202 Encounter for pregnancy test, result negative: Secondary | ICD-10-CM | POA: Insufficient documentation

## 2020-04-24 DIAGNOSIS — N912 Amenorrhea, unspecified: Secondary | ICD-10-CM

## 2020-04-24 LAB — POCT PREGNANCY, URINE: Preg Test, Ur: NEGATIVE

## 2020-04-24 LAB — HCG, QUANTITATIVE, PREGNANCY: hCG, Beta Chain, Quant, S: 1 m[IU]/mL (ref <5)
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
## 2020-04-24 NOTE — MAU Note (Signed)
Abdominal cramps that started 2 days ago.  Denies VB.  States she has had a positive HPT.  LMP 03/29/20.

## 2020-05-09 ENCOUNTER — Emergency Department (HOSPITAL_COMMUNITY)
Admission: EM | Admit: 2020-05-09 | Discharge: 2020-05-10 | Disposition: A | Discharge status: Home / Self Care | Payer: Medicaid - Out of State | Attending: Emergency Medicine | Admitting: Emergency Medicine

## 2020-05-09 ENCOUNTER — Encounter (HOSPITAL_COMMUNITY): Payer: Self-pay | Admitting: Emergency Medicine

## 2020-05-09 ENCOUNTER — Emergency Department (HOSPITAL_COMMUNITY): Payer: Medicaid - Out of State

## 2020-05-09 ENCOUNTER — Other Ambulatory Visit: Payer: Self-pay

## 2020-05-09 DIAGNOSIS — S8992XA Unspecified injury of left lower leg, initial encounter: Secondary | ICD-10-CM | POA: Diagnosis present

## 2020-05-09 DIAGNOSIS — W3400XA Accidental discharge from unspecified firearms or gun, initial encounter: Secondary | ICD-10-CM | POA: Insufficient documentation

## 2020-05-09 DIAGNOSIS — F17290 Nicotine dependence, other tobacco product, uncomplicated: Secondary | ICD-10-CM | POA: Diagnosis not present

## 2020-05-09 DIAGNOSIS — I10 Essential (primary) hypertension: Secondary | ICD-10-CM | POA: Insufficient documentation

## 2020-05-09 DIAGNOSIS — S81802A Unspecified open wound, left lower leg, initial encounter: Secondary | ICD-10-CM

## 2020-05-09 MED ORDER — OXYCODONE-ACETAMINOPHEN 5-325 MG PO TABS
1.0000 | ORAL_TABLET | ORAL | Status: DC | PRN
  Administered 2020-05-09: 1 via ORAL
  Filled 2020-05-09: qty 1

## 2020-05-09 NOTE — ED Notes (Signed)
Patient educated about not driving or performing other critical tasks (such as operating heavy machinery, caring for infant/toddler/child) due to sedative nature of narcotic medications received while in the ED.  Pt/caregiver verbalized understanding.   
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
## 2020-05-10 MED ORDER — OXYCODONE-ACETAMINOPHEN 5-325 MG PO TABS
1.0000 | ORAL_TABLET | Freq: Once | ORAL | Status: AC
Start: 2020-05-10 — End: 2020-05-10
  Administered 2020-05-10: 1 via ORAL
  Filled 2020-05-10: qty 1

## 2020-05-10 MED ORDER — OXYCODONE HCL 5 MG PO TABS: 5.0000 mg | ORAL_TABLET | Freq: Four times a day (QID) | ORAL | 0 refills | Status: DC | PRN

## 2020-05-10 MED ORDER — FENTANYL CITRATE (PF) 100 MCG/2ML IJ SOLN
50.0000 ug | Freq: Once | INTRAMUSCULAR | Status: AC
  Administered 2020-05-10: 50 ug via INTRAVENOUS
  Filled 2020-05-10: qty 2

## 2020-05-10 NOTE — Discharge Instructions (Signed)
Keep the wound clean and dry and covered.  You can let warm soapy water run over the wound as it heals, but be careful to dry it gently.  Keep a clean dressing on top of it.  Bearing weight as tolerated with crutches.  Tylenol and ibuprofen as described below.  With narcotic pain control for breakthrough pain  You can take 600 mg of ibuprofen every 6 hours, you can take 1000 mg of Tylenol every 6 hours, you can alternate these every 3 or you can take them together.
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
## 2020-05-10 NOTE — ED Notes (Signed)
Pt given crutches and educated on use

## 2020-05-10 NOTE — ED Notes (Signed)
Discharge instructions discussed with pt. Pt verbalized understanding. Pt stable and ambulatory. No signature pad available. 

## 2020-05-21 ENCOUNTER — Ambulatory Visit: Payer: Medicaid - Out of State | Admitting: Physician Assistant

## 2020-06-02 ENCOUNTER — Other Ambulatory Visit: Payer: Self-pay

## 2020-06-02 ENCOUNTER — Other Ambulatory Visit
Admission: RE | Admit: 2020-06-02 | Discharge: 2020-06-02 | Disposition: A | Discharge status: Home / Self Care | Payer: Medicaid - Out of State | Source: Ambulatory Visit | Attending: Physician Assistant | Admitting: Physician Assistant

## 2020-06-02 ENCOUNTER — Encounter: Payer: Medicaid - Out of State | Attending: Physician Assistant | Admitting: Physician Assistant

## 2020-06-02 DIAGNOSIS — L97929 Non-pressure chronic ulcer of unspecified part of left lower leg with unspecified severity: Secondary | ICD-10-CM | POA: Insufficient documentation

## 2020-06-02 DIAGNOSIS — L97822 Non-pressure chronic ulcer of other part of left lower leg with fat layer exposed: Secondary | ICD-10-CM | POA: Diagnosis not present

## 2020-06-02 DIAGNOSIS — I10 Essential (primary) hypertension: Secondary | ICD-10-CM | POA: Diagnosis not present

## 2020-06-02 DIAGNOSIS — L089 Local infection of the skin and subcutaneous tissue, unspecified: Secondary | ICD-10-CM | POA: Diagnosis not present

## 2020-06-02 NOTE — Progress Notes (Signed)
VINAYA, SANCHO ([id number]) Visit Report for 06/02/2020 Allergy List Details Patient Name: Sharon Johnston, Sharon Johnston. Date of Service: 06/02/2020 1:00 PM Medical Record Number: [id number] Patient Account Number: [id number] Date of Birth/Sex: [birth year] (30 y.o. F) Treating RN: Yevonne Pax Primary Care Clarissia Mckeen: PATIENT, NO Other Clinician: Lolita Cram Referring Sullivan Blasing: Myrtis Ser, ERIC Treating Joplin Canty/Extender: Rowan Blase in Treatment: 0 Allergies Active Allergies No Known Allergies Allergy Notes Electronic Signature(s) Signed: 06/02/2020 4:27:39 PM By: Yevonne Pax RN Entered By: Yevonne Pax on 06/02/2020 13:16:19 Distel, [name] ([id number]) -------------------------------------------------------------------------------- Arrival Information Details Patient Name: [name] Date of Service: 06/02/2020 1:00 PM Medical Record Number: [id number] Patient Account Number: [id number] Date of Birth/Sex: May 23, 1990 (30 y.o. F) Treating RN: Yevonne Pax Primary Care Nikeia Henkes: PATIENT, NO Other Clinician: Lolita Cram Referring Qunicy Higinbotham: Myrtis Ser, ERIC Treating Juelz Claar/Extender: Rowan Blase in Treatment: 0 Visit Information Patient Arrived: Cane Arrival Time: 13:12 Accompanied By: self Transfer Assistance: None Patient Identification Verified: Yes Secondary Verification Process Completed: Yes Patient Requires Transmission-Based Precautions: No Patient Has Alerts: No Electronic Signature(s) Signed: 06/02/2020 4:27:39 PM By: Yevonne Pax RN Entered By: Yevonne Pax on 06/02/2020 13:13:12 Stmarie, [name] ([id number]) -------------------------------------------------------------------------------- Clinic Level of Care Assessment Details Patient Name: [name]. Date of Service: 06/02/2020 1:00 PM Medical Record Number: [id number] Patient Account Number: [id number] Date of Birth/Sex: 12-Jun-1990 (30 y.o. F) Treating RN: Rogers Blocker Primary Care Vali Capano: PATIENT,  NO Other Clinician: Lolita Cram Referring Kapono Luhn: Myrtis Ser, ERIC Treating Joslynn Jamroz/Extender: Rowan Blase in Treatment: 0 Clinic Level of Care Assessment Items TOOL 4 Quantity Score X - Use when only an EandM is performed on FOLLOW-UP visit 1 0 ASSESSMENTS - Nursing Assessment / Reassessment X - Reassessment of Co-morbidities (includes updates in patient status) 1 10 X- 1 5 Reassessment of Adherence to Treatment Plan ASSESSMENTS - Wound and Skin Assessment / Reassessment []  - Simple Wound Assessment / Reassessment - one wound 0 X- 3 5 Complex Wound Assessment / Reassessment - multiple wounds []  - 0 Dermatologic / Skin Assessment (not related to wound area) ASSESSMENTS - Focused Assessment []  - Circumferential Edema Measurements - multi extremities 0 []  - 0 Nutritional Assessment / Counseling / Intervention []  - 0 Lower Extremity Assessment (monofilament, tuning fork, pulses) []  - 0 Peripheral Arterial Disease Assessment (using hand held doppler) ASSESSMENTS - Ostomy and/or Continence Assessment and Care []  - Incontinence Assessment and Management 0 []  - 0 Ostomy Care Assessment and Management (repouching, etc.) PROCESS - Coordination of Care X - Simple Patient / Family Education for ongoing care 1 15 []  - 0 Complex (extensive) Patient / Family Education for ongoing care []  - 0 Staff obtains , Records, Test Results / Process Orders []  - 0 Staff telephones HHA, Nursing Homes / Clarify orders / etc []  - 0 Routine Transfer to another Facility (non-emergent condition) []  - 0 Routine Hospital Admission (non-emergent condition) []  - 0 New Admissions / / Ordering NPWT, Apligraf, etc. []  - 0 Emergency Hospital Admission (emergent condition) X- 1 10 Simple Discharge Coordination []  - 0 Complex (extensive) Discharge Coordination PROCESS - Special Needs []  - Pediatric / Minor Patient Management 0 []  - 0 Isolation Patient Management []   - 0 Hearing / Language / Visual special needs []  - 0 Assessment of Community assistance (transportation, D/C planning, etc.) []  - 0 Additional assistance / Altered mentation []  - 0 Support Surface(s) Assessment (bed, cushion, seat, etc.) INTERVENTIONS - Wound Cleansing / Measurement Geraghty, Shenae N. ( ) []  - 0 Simple Wound Cleansing -  one wound X- 3 5 Complex Wound Cleansing - multiple wounds X- 1 5 Wound Imaging (photographs - any number of wounds) []  - 0 Wound Tracing (instead of photographs) []  - 0 Simple Wound Measurement - one wound X- 3 5 Complex Wound Measurement - multiple wounds INTERVENTIONS - Wound Dressings []  - Small Wound Dressing one or multiple wounds 0 X- 1 15 Medium Wound Dressing one or multiple wounds []  - 0 Large Wound Dressing one or multiple wounds []  - 0 Application of Medications - topical []  - 0 Application of Medications - injection INTERVENTIONS - Miscellaneous []  - External ear exam 0 []  - 0 Specimen Collection (cultures, biopsies, blood, body fluids, etc.) []  - 0 Specimen(s) / Culture(s) sent or taken to Lab for analysis []  - 0 Patient Transfer (multiple staff / Nurse, adultHoyer Lift / Similar devices) []  - 0 Simple Staple / Suture removal (25 or less) []  - 0 Complex Staple / Suture removal (26 or more) []  - 0 Hypo / Hyperglycemic Management (close monitor of Blood Glucose) X- 1 15 Ankle / Brachial Index (ABI) - do not check if billed separately X- 1 5 Vital Signs Has the patient been seen at the hospital within the last three years: Yes Total Score: 125 Level Of Care: New/Established - Level 4 Electronic Signature(s) Signed: 06/02/2020 4:53:32 PM By: Phillis HaggisSanchez Pereyda, Dondra PraderKenia RN Entered By: Phillis HaggisSanchez Pereyda, Kenia on 06/02/2020 14:21:53 Rolin, [name] ([id number]) -------------------------------------------------------------------------------- Encounter Discharge Information Details Patient Name: Ollen BargesAVIS, Elzie N. Date of Service:  06/02/2020 1:00 PM Medical Record Number: [id number] Patient Account Number: [id number] Date of Birth/Sex: 1990-07-12 (29 y.o. F) Treating RN: Rogers BlockerSanchez, Kenia Primary Care Esequiel Kleinfelter: PATIENT, NO Other Clinician: Lolita CramBurnette, Kyara Referring Tip Atienza: Myrtis SerKATZ, ERIC Treating Kveon Casanas/Extender: Rowan BlaseStone, Hoyt Weeks in Treatment: 0 Encounter Discharge Information Items Discharge Condition: Stable Ambulatory Status: Crutches Discharge Destination: Home Transportation: Private Auto Accompanied By: self Schedule Follow-up Appointment: Yes Clinical Summary of Care: Electronic Signature(s) Signed: 06/02/2020 4:53:32 PM By: Phillis HaggisSanchez Pereyda, Dondra PraderKenia RN Entered By: Phillis HaggisSanchez Pereyda, Dondra PraderKenia on 06/02/2020 14:22:55 Joerger, [name] ([id number]) -------------------------------------------------------------------------------- Lower Extremity Assessment Details Patient Name: Ollen BargesAVIS, Ruthie N. Date of Service: 06/02/2020 1:00 PM Medical Record Number: [id number] Patient Account Number: [id number] Date of Birth/Sex: 1990-07-12 (29 y.o. F) Treating RN: Yevonne PaxEpps, Carrie Primary Care Addie Alonge: PATIENT, NO Other Clinician: Lolita CramBurnette, Kyara Referring Esther Bradstreet: Myrtis SerKATZ, ERIC Treating Roey Coopman/Extender: Rowan BlaseStone, Hoyt Weeks in Treatment: 0 Edema Assessment Assessed: [Left: No] [Right: No] Edema: [Left: Ye] [Right: s] Calf Left: Right: Point of Measurement: 38 cm From Medial Instep 30 cm Ankle Left: Right: Point of Measurement: 11 cm From Medial Instep 20 cm Knee To Floor Left: Right: From Medial Instep 46 cm Vascular Assessment Pulses: Dorsalis Pedis Palpable: [Left:Yes] Doppler Audible: [Left:Yes] Blood Pressure: Brachial: [Left:148] Ankle: [Left:Dorsalis Pedis: 150 1.01] Electronic Signature(s) Signed: 06/02/2020 4:27:39 PM By: Yevonne PaxEpps, Carrie RN Entered By: Yevonne PaxEpps, Carrie on 06/02/2020 13:54:30 Hyden, [name] ([id number]) -------------------------------------------------------------------------------- Multi Wound Chart  Details Patient Name: Ollen BargesAVIS, Aiya N. Date of Service: 06/02/2020 1:00 PM Medical Record Number: [id number] Patient Account Number: [id number] Date of Birth/Sex: 1990-07-12 (29 y.o. F) Treating RN: Rogers BlockerSanchez, Kenia Primary Care Nadezhda Pollitt: PATIENT, NO Other Clinician: Lolita CramBurnette, Kyara Referring Cleaster Shiffer: Myrtis SerKATZ, ERIC Treating Tayton Decaire/Extender: Rowan BlaseStone, Hoyt Weeks in Treatment: 0 Vital Signs Height(in): 62 Pulse(bpm): 75 Weight(lbs): 120 Blood Pressure(mmHg): 166/93 Body Mass Index(BMI): 22 Temperature(F): 99 Respiratory Rate(breaths/min): 16 Photos: Wound Location: Left, Anterior Lower Leg Left, Medial Lower Leg Left, Lateral Lower Leg Wounding Event: Trauma Trauma Trauma Primary Etiology: Trauma, Other Trauma, Other Trauma,  Other Comorbid History: Hypertension Hypertension Hypertension Date Acquired: 05/09/2020 05/09/2020 05/09/2020 Weeks of Treatment: 0 0 0 Wound Status: Open Open Open Measurements L x W x D (cm) 2x2.8x0.2 1.5x1.1x0.1 0.2x0.3x0.5 Area (cm) : 4.398 1.296 0.047 Volume (cm) : 0.88 0.13 0.024 Classification: Full Thickness Without Exposed Full Thickness Without Exposed Full Thickness Without Exposed Support Structures Support Structures Support Structures Exudate Amount: Medium Medium Medium Exudate Type: Serosanguineous Serosanguineous Serosanguineous Exudate Color: red, brown red, brown red, brown Granulation Amount: Medium (34-66%) Medium (34-66%) None Present (0%) Granulation Quality: Red, Pink Red, Pink N/A Necrotic Amount: Medium (34-66%) Medium (34-66%) Large (67-100%) Exposed Structures: Fat Layer (Subcutaneous Tissue): Fat Layer (Subcutaneous Tissue): Fat Layer (Subcutaneous Tissue): Yes Yes Yes Fascia: No Fascia: No Fascia: No Tendon: No Tendon: No Tendon: No Muscle: No Muscle: No Muscle: No Joint: No Joint: No Joint: No Bone: No Bone: No Bone: No Epithelialization: None None None Treatment Notes Electronic Signature(s) Signed: 06/02/2020  4:53:32 PM By: Phillis Haggis, Dondra Prader RN Entered By: Phillis Haggis, Dondra Prader on 06/02/2020 14:14:01 Macquarrie, [name] ([id number]) -------------------------------------------------------------------------------- Multi-Disciplinary Care Plan Details Patient Name: [name]. Date of Service: 06/02/2020 1:00 PM Medical Record Number: [id number] Patient Account Number: [id number] Date of Birth/Sex: 1990/04/07 (29 y.o. F) Treating RN: Rogers Blocker Primary Care Keya Wynes: PATIENT, NO Other Clinician: Lolita Cram Referring Shatarra Wehling: Myrtis Ser, ERIC Treating Lalita Ebel/Extender: Rowan Blase in Treatment: 0 Active Inactive Orientation to the Wound Care Program Nursing Diagnoses: Knowledge deficit related to the wound healing center program Goals: Patient/caregiver will verbalize understanding of the Wound Healing Center Program Date Initiated: 06/02/2020 Target Resolution Date: 06/02/2020 Goal Status: Active Interventions: Provide education on orientation to the wound center Notes: Wound/Skin Impairment Nursing Diagnoses: Impaired tissue integrity Goals: Patient/caregiver will verbalize understanding of skin care regimen Date Initiated: 06/02/2020 Target Resolution Date: 06/02/2020 Goal Status: Active Ulcer/skin breakdown will have a volume reduction of 30% by week 4 Date Initiated: 06/02/2020 Target Resolution Date: 07/03/2020 Goal Status: Active Ulcer/skin breakdown will have a volume reduction of 50% by week 8 Date Initiated: 06/02/2020 Target Resolution Date: 08/02/2020 Goal Status: Active Ulcer/skin breakdown will have a volume reduction of 80% by week 12 Date Initiated: 06/02/2020 Target Resolution Date: 09/02/2020 Goal Status: Active Interventions: Assess patient/caregiver ability to obtain necessary supplies Assess patient/caregiver ability to perform ulcer/skin care regimen upon admission and as needed Assess ulceration(s) every visit Provide education on ulcer and skin  care Treatment Activities: Referred to DME Jailani Hogans for dressing supplies : 06/02/2020 Skin care regimen initiated : 06/02/2020 Notes: Electronic Signature(s) Signed: 06/02/2020 4:53:32 PM By: Phillis Haggis, Dondra Prader RN Entered By: Phillis Haggis, Dondra Prader on 06/02/2020 14:13:46 Portillo, [name] ([id number]) -------------------------------------------------------------------------------- Pain Assessment Details Patient Name: [name]. Date of Service: 06/02/2020 1:00 PM Medical Record Number: [id number] Patient Account Number: [id number] Date of Birth/Sex: 12/02/1990 (29 y.o. F) Treating RN: Yevonne Pax Primary Care Benyamin Jeff: PATIENT, NO Other Clinician: Lolita Cram Referring Zackarey Holleman: Myrtis Ser, ERIC Treating Irini Leet/Extender: Rowan Blase in Treatment: 0 Active Problems Location of Pain Severity and Description of Pain Patient Has Paino Yes Site Locations With Dressing Change: Yes Duration of the Pain. Constant / Intermittento Constant Rate the pain. Current Pain Level: 6 Worst Pain Level: 8 Least Pain Level: 1 Character of Pain Describe the Pain: Aching, Burning Pain Management and Medication Current Pain Management: Medication: Yes Cold Application: No Rest: Yes Massage: No Activity: No T.E.N.S.: No Heat Application: No Leg drop or elevation: No Is the Current Pain Management Adequate: Inadequate How does your wound impact  your activities of daily livingo Sleep: Yes Bathing: No Appetite: No Relationship With Others: No Bladder Continence: No Emotions: No Bowel Continence: No Work: No Toileting: No Drive: No Dressing: No Hobbies: No Electronic Signature(s) Signed: 06/02/2020 4:27:39 PM By: Yevonne Pax RN Entered By: Yevonne Pax on 06/02/2020 13:15:15 Pring, [name] ([id number]) -------------------------------------------------------------------------------- Patient/Caregiver Education Details Patient Name: [name] Date of Service:  06/02/2020 1:00 PM Medical Record Number: [id number] Patient Account Number: [id number] Date of Birth/Gender: 1990-12-15 (29 y.o. F) Treating RN: Rogers Blocker Primary Care Physician: PATIENT, NO Other Clinician: Lolita Cram Referring Physician: Myrtis Ser, ERIC Treating Physician/Extender: Rowan Blase in Treatment: 0 Education Assessment Education Provided To: Patient Education Topics Provided Welcome To The Wound Care Center: Methods: Explain/Verbal Responses: State content correctly Wound/Skin Impairment: Methods: Explain/Verbal Responses: State content correctly Electronic Signature(s) Signed: 06/02/2020 4:53:32 PM By: Phillis Haggis, Dondra Prader RN Entered By: Phillis Haggis, Dondra Prader on 06/02/2020 14:22:14 Balles, [name] ([id number]) -------------------------------------------------------------------------------- Wound Assessment Details Patient Name: [name]. Date of Service: 06/02/2020 1:00 PM Medical Record Number: [id number] Patient Account Number: [id number] Date of Birth/Sex: 04-01-1990 (29 y.o. F) Treating RN: Yevonne Pax Primary Care Jadie Comas: PATIENT, NO Other Clinician: Lolita Cram Referring Manjot Beumer: Myrtis Ser, ERIC Treating Shelva Hetzer/Extender: Rowan Blase in Treatment: 0 Wound Status Wound Number: 1 Primary Etiology: Trauma, Other Wound Location: Left, Anterior Lower Leg Wound Status: Open Wounding Event: Trauma Comorbid History: Hypertension Date Acquired: 05/09/2020 Weeks Of Treatment: 0 Clustered Wound: No Photos Wound Measurements Length: (cm) 2 Width: (cm) 2.8 Depth: (cm) 0.2 Area: (cm) 4.398 Volume: (cm) 0.88 % Reduction in Area: % Reduction in Volume: Epithelialization: None Tunneling: No Undermining: No Wound Description Classification: Full Thickness Without Exposed Support Structures Exudate Amount: Medium Exudate Type: Serosanguineous Exudate Color: red, brown Foul Odor After Cleansing: No Slough/Fibrino Yes Wound  Bed Granulation Amount: Medium (34-66%) Exposed Structure Granulation Quality: Red, Pink Fascia Exposed: No Necrotic Amount: Medium (34-66%) Fat Layer (Subcutaneous Tissue) Exposed: Yes Necrotic Quality: Adherent Slough Tendon Exposed: No Muscle Exposed: No Joint Exposed: No Bone Exposed: No Treatment Notes Wound #1 (Lower Leg) Wound Laterality: Left, Anterior Cleanser Normal Saline Discharge Instruction: Wash your hands with soap and water. Remove old dressing, discard into plastic bag and place into trash. Cleanse the wound with Normal Saline prior to applying a clean dressing using gauze sponges, not tissues or cotton balls. Do not scrub or use excessive force. Pat dry using gauze sponges, not tissue or cotton balls. INDRIA, BISHARA ([id number]) Peri-Wound Care Topical Primary Dressing Hydrofera Blue Ready Transfer Foam, 4x5 (in/in) Discharge Instruction: Apply Hydrofera Blue Ready to wound bed as directed Secondary Dressing Gauze Discharge Instruction: As directed: dry, moistened with saline or moistened with Dakins Solution Secured With Conforming Stretch Gauze Bandage 4x75 (in/in) Discharge Instruction: Apply as directed Compression Wrap Compression Stockings Add-Ons Electronic Signature(s) Signed: 06/02/2020 4:27:39 PM By: Yevonne Pax RN Entered By: Yevonne Pax on 06/02/2020 13:48:57 Dentinger, [name] ([id number]) -------------------------------------------------------------------------------- Wound Assessment Details Patient Name: [name]. Date of Service: 06/02/2020 1:00 PM Medical Record Number: [id number] Patient Account Number: [id number] Date of Birth/Sex: 1990-11-14 (29 y.o. F) Treating RN: Yevonne Pax Primary Care Abad Manard: PATIENT, NO Other Clinician: Lolita Cram Referring Marv Alfrey: Myrtis Ser, ERIC Treating Keshawn Fiorito/Extender: Rowan Blase in Treatment: 0 Wound Status Wound Number: 2 Primary Etiology: Trauma, Other Wound Location: Left,  Medial Lower Leg Wound Status: Open Wounding Event: Trauma Comorbid History: Hypertension Date Acquired: 05/09/2020 Weeks Of Treatment: 0 Clustered Wound: No Photos Wound Measurements Length: (cm) 1.5 Width: (  cm) 1.1 Depth: (cm) 0.1 Area: (cm) 1.296 Volume: (cm) 0.13 % Reduction in Area: % Reduction in Volume: Epithelialization: None Tunneling: No Undermining: No Wound Description Classification: Full Thickness Without Exposed Support Structures Exudate Amount: Medium Exudate Type: Serosanguineous Exudate Color: red, brown Foul Odor After Cleansing: No Slough/Fibrino Yes Wound Bed Granulation Amount: Medium (34-66%) Exposed Structure Granulation Quality: Red, Pink Fascia Exposed: No Necrotic Amount: Medium (34-66%) Fat Layer (Subcutaneous Tissue) Exposed: Yes Necrotic Quality: Adherent Slough Tendon Exposed: No Muscle Exposed: No Joint Exposed: No Bone Exposed: No Treatment Notes Wound #2 (Lower Leg) Wound Laterality: Left, Medial Cleanser Normal Saline Discharge Instruction: Wash your hands with soap and water. Remove old dressing, discard into plastic bag and place into trash. Cleanse the wound with Normal Saline prior to applying a clean dressing using gauze sponges, not tissues or cotton balls. Do not scrub or use excessive force. Pat dry using gauze sponges, not tissue or cotton balls. YOLTZIN, RANSOM ([id number]) Peri-Wound Care Topical Primary Dressing Hydrofera Blue Ready Transfer Foam, 4x5 (in/in) Discharge Instruction: Apply Hydrofera Blue Ready to wound bed as directed Secondary Dressing Gauze Discharge Instruction: As directed: dry, moistened with saline or moistened with Dakins Solution Secured With Conforming Stretch Gauze Bandage 4x75 (in/in) Discharge Instruction: Apply as directed Compression Wrap Compression Stockings Add-Ons Electronic Signature(s) Signed: 06/02/2020 4:27:39 PM By: Yevonne Pax RN Entered By: Yevonne Pax on 06/02/2020  13:50:36 Keithley, [name] ([id number]) -------------------------------------------------------------------------------- Wound Assessment Details Patient Name: [name]. Date of Service: 06/02/2020 1:00 PM Medical Record Number: [id number] Patient Account Number: [id number] Date of Birth/Sex: [birth year] (29 y.o. F) Treating RN: Yevonne Pax Primary Care Frederick Marro: PATIENT, NO Other Clinician: Lolita Cram Referring Evelise Reine: Myrtis Ser, ERIC Treating Derreck Wiltsey/Extender: Rowan Blase in Treatment: 0 Wound Status Wound Number: 3 Primary Etiology: Trauma, Other Wound Location: Left, Lateral Lower Leg Wound Status: Open Wounding Event: Trauma Comorbid History: Hypertension Date Acquired: 05/09/2020 Weeks Of Treatment: 0 Clustered Wound: No Photos Wound Measurements Length: (cm) 0.2 Width: (cm) 0.3 Depth: (cm) 0.5 Area: (cm) 0.047 Volume: (cm) 0.024 % Reduction in Area: % Reduction in Volume: Epithelialization: None Tunneling: No Undermining: No Wound Description Classification: Full Thickness Without Exposed Support Structures Exudate Amount: Medium Exudate Type: Serosanguineous Exudate Color: red, brown Foul Odor After Cleansing: No Slough/Fibrino Yes Wound Bed Granulation Amount: None Present (0%) Exposed Structure Necrotic Amount: Large (67-100%) Fascia Exposed: No Necrotic Quality: Adherent Slough Fat Layer (Subcutaneous Tissue) Exposed: Yes Tendon Exposed: No Muscle Exposed: No Joint Exposed: No Bone Exposed: No Treatment Notes Wound #3 (Lower Leg) Wound Laterality: Left, Lateral Cleanser Normal Saline Discharge Instruction: Wash your hands with soap and water. Remove old dressing, discard into plastic bag and place into trash. Cleanse the wound with Normal Saline prior to applying a clean dressing using gauze sponges, not tissues or cotton balls. Do not scrub or use excessive force. Pat dry using gauze sponges, not tissue or cotton balls. FARIHA, GOTO ([id number]) Peri-Wound Care Topical Primary Dressing Hydrofera Blue Ready Transfer Foam, 4x5 (in/in) Discharge Instruction: Apply Hydrofera Blue Ready to wound bed as directed Secondary Dressing Gauze Discharge Instruction: As directed: dry, moistened with saline or moistened with Dakins Solution Secured With Conforming Stretch Gauze Bandage 4x75 (in/in) Discharge Instruction: Apply as directed Compression Wrap Compression Stockings Add-Ons Electronic Signature(s) Signed: 06/02/2020 4:27:39 PM By: Yevonne Pax RN Entered By: Yevonne Pax on 06/02/2020 13:52:43 Nieland, [name] ([id number]) -------------------------------------------------------------------------------- Vitals Details Patient Name: [name] Date of Service: 06/02/2020 1:00 PM Medical Record Number:  [id number] Patient Account Number: [id number] Date of Birth/Sex: 1990/05/31 (29 y.o. F) Treating RN: Yevonne Pax Primary Care Nyle Limb: PATIENT, NO Other Clinician: Lolita Cram Referring Shajuana Mclucas: Myrtis Ser, ERIC Treating Dasja Brase/Extender: Rowan Blase in Treatment: 0 Vital Signs Time Taken: 13:15 Temperature (F): 99 Height (in): 62 Pulse (bpm): 75 Source: Stated Respiratory Rate (breaths/min): 16 Weight (lbs): 120 Blood Pressure (mmHg): 166/93 Source: Stated Reference Range: 80 - 120 mg / dl Body Mass Index (BMI): 21.9 Electronic Signature(s) Signed: 06/02/2020 4:27:39 PM By: Yevonne Pax RN Entered By: Yevonne Pax on 06/02/2020 13:15:56
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
## 2020-06-07 LAB — AEROBIC/ANAEROBIC CULTURE W GRAM STAIN (SURGICAL/DEEP WOUND)

## 2020-06-09 ENCOUNTER — Other Ambulatory Visit: Payer: Self-pay

## 2020-06-09 ENCOUNTER — Encounter: Payer: Medicaid - Out of State | Admitting: Physician Assistant

## 2020-06-09 DIAGNOSIS — L97822 Non-pressure chronic ulcer of other part of left lower leg with fat layer exposed: Secondary | ICD-10-CM | POA: Diagnosis not present

## 2020-06-09 NOTE — Progress Notes (Addendum)
[name], [name] ([id number]) Visit Report for 06/09/2020 Arrival Information Details Patient Name: Sharon Johnston, Sharon Johnston. Date of Service: 06/09/2020 2:45 PM Medical Record Number: [id number] Patient Account Number: [id number] Date of Birth/Sex: 09-22-1990 (29 y.o. Female) Treating RN: Sharon Johnston Primary Care Sharon Johnston: PATIENT, NO Other Clinician: Lolita Cram Referring Espen Bethel: Sharon Johnston Treating Audrionna Lampton/Extender: Sharon Johnston in Treatment: 1 Visit Information History Since Last Visit Added or deleted any medications: Yes Patient Arrived: Crutches Had a fall or experienced change in No Arrival Time: 15:15 activities of daily living that may affect Accompanied By: self risk of falls: Transfer Assistance: None Hospitalized since last visit: No Patient Identification Verified: Yes Pain Present Now: Yes Secondary Verification Process Completed: Yes Patient Requires Transmission-Based Precautions: No Patient Has Alerts: No Electronic Signature(s) Signed: 06/09/2020 3:37:26 PM By: Lolita Cram Entered By: Lolita Cram on 06/09/2020 15:17:17 Johnston, Sharon Milling ([id number]) -------------------------------------------------------------------------------- Clinic Level of Care Assessment Details Patient Name: Sharon Johnston. Date of Service: 06/09/2020 2:45 PM Medical Record Number: [id number] Patient Account Number: [id number] Date of Birth/Sex: 1990/05/29 (29 y.o. Female) Treating RN: Sharon Johnston Primary Care Denaja Verhoeven: PATIENT, NO Other Clinician: Lolita Cram Referring Talha Iser: Sharon Johnston Treating Pinkie Manger/Extender: Sharon Johnston in Treatment: 1 Clinic Level of Care Assessment Items TOOL 4 Quantity Score X - Use when only an EandM is performed on FOLLOW-UP visit 1 0 ASSESSMENTS - Nursing Assessment / Reassessment X - Reassessment of Co-morbidities (includes updates in patient status) 1 10 X- 1 5 Reassessment of Adherence to Treatment Plan ASSESSMENTS -  Wound and Skin Assessment / Reassessment []  - Simple Wound Assessment / Reassessment - one wound 0 X- 3 5 Complex Wound Assessment / Reassessment - multiple wounds []  - 0 Dermatologic / Skin Assessment (not related to wound area) ASSESSMENTS - Focused Assessment []  - Circumferential Edema Measurements - multi extremities 0 []  - 0 Nutritional Assessment / Counseling / Intervention []  - 0 Lower Extremity Assessment (monofilament, tuning fork, pulses) []  - 0 Peripheral Arterial Disease Assessment (using hand held doppler) ASSESSMENTS - Ostomy and/or Continence Assessment and Care []  - Incontinence Assessment and Management 0 []  - 0 Ostomy Care Assessment and Management (repouching, etc.) PROCESS - Coordination of Care X - Simple Patient / Family Education for ongoing care 1 15 []  - 0 Complex (extensive) Patient / Family Education for ongoing care []  - 0 Staff obtains , Records, Test Results / Process Orders []  - 0 Staff telephones HHA, Nursing Homes / Clarify orders / etc []  - 0 Routine Transfer to another Facility (non-emergent condition) []  - 0 Routine Hospital Admission (non-emergent condition) []  - 0 New Admissions / / Ordering NPWT, Apligraf, etc. []  - 0 Emergency Hospital Admission (emergent condition) X- 1 10 Simple Discharge Coordination []  - 0 Complex (extensive) Discharge Coordination PROCESS - Special Needs []  - Pediatric / Minor Patient Management 0 []  - 0 Isolation Patient Management []  - 0 Hearing / Language / Visual special needs []  - 0 Assessment of Community assistance (transportation, D/C planning, etc.) []  - 0 Additional assistance / Altered mentation []  - 0 Support Surface(s) Assessment (bed, cushion, seat, etc.) INTERVENTIONS - Wound Cleansing / Measurement Johnston, Sharon N. ( ) []  - 0 Simple Wound Cleansing - one wound X- 3 5 Complex Wound Cleansing - multiple wounds X- 1 5 Wound Imaging (photographs -  any number of wounds) []  - 0 Wound Tracing (instead of photographs) []  - 0 Simple Wound Measurement - one wound X- 3 5 Complex Wound Measurement - multiple wounds  INTERVENTIONS - Wound Dressings []  - Small Wound Dressing one or multiple wounds 0 X- 1 15 Medium Wound Dressing one or multiple wounds []  - 0 Large Wound Dressing one or multiple wounds []  - 0 Application of Medications - topical []  - 0 Application of Medications - injection INTERVENTIONS - Miscellaneous []  - External ear exam 0 []  - 0 Specimen Collection (cultures, biopsies, blood, body fluids, etc.) []  - 0 Specimen(s) / Culture(s) sent or taken to Lab for analysis []  - 0 Patient Transfer (multiple staff / Michiel SitesHoyer Lift / Similar devices) []  - 0 Simple Staple / Suture removal (25 or less) []  - 0 Complex Staple / Suture removal (26 or more) []  - 0 Hypo / Hyperglycemic Management (close monitor of Blood Glucose) []  - 0 Ankle / Brachial Index (ABI) - do not check if billed separately X- 1 5 Vital Signs Has the patient been seen at the hospital within the last three years: Yes Total Score: 110 Level Of Care: New/Established - Level 3 Electronic Signature(s) Signed: 06/09/2020 5:09:07 PM By: Phillis HaggisSanchez Pereyda, Dondra PraderKenia RN Entered By: Phillis HaggisSanchez Pereyda, Kenia on 06/09/2020 15:59:59 Johnston, Sharon MillingALEXUS N. ([id number]) -------------------------------------------------------------------------------- Encounter Discharge Information Details Patient Name: Sharon Johnston, Sharon N. Date of Service: 06/09/2020 2:45 PM Medical Record Number: [id number] Patient Account Number: [id number] Date of Birth/Sex: Dec 27, 1990 (29 y.o. Female) Treating RN: Yevonne PaxEpps, Carrie Primary Care Breckan Cafiero: PATIENT, NO Other Clinician: Lolita CramBurnette, Sharon Johnston: Sharon PerchesKatz, Eric Treating Arlin Sass/Extender: Sharon BlaseStone, Hoyt Weeks in Treatment: 1 Encounter Discharge Information Items Discharge Condition: Stable Ambulatory Status: Ambulatory Discharge Destination:  Home Transportation: Private Auto Accompanied By: self Schedule Follow-up Appointment: Yes Clinical Summary of Care: Patient Declined Electronic Signature(s) Signed: 06/15/2020 5:08:01 PM By: Yevonne PaxEpps, Carrie RN Entered By: Yevonne PaxEpps, Carrie on 06/09/2020 16:10:00 Soroka, Sharon MillingALEXUS N. ([id number]) -------------------------------------------------------------------------------- Multi Wound Chart Details Patient Name: Sharon Johnston, Sharon N. Date of Service: 06/09/2020 2:45 PM Medical Record Number: [id number] Patient Account Number: [id number] Date of Birth/Sex: Dec 27, 1990 (29 y.o. Female) Treating RN: Sharon BlockerSanchez, Kenia Primary Care Jamont Mellin: PATIENT, NO Other Clinician: Lolita CramBurnette, Sharon Referring Everlee Quakenbush: Sharon PerchesKatz, Eric Treating Bralynn Velador/Extender: Sharon BlaseStone, Hoyt Weeks in Treatment: 1 Vital Signs Height(in): 62 Pulse(bpm): 87 Weight(lbs): 120 Blood Pressure(mmHg): 166/108 Body Mass Index(BMI): 22 Temperature(F): 98.4 Respiratory Rate(breaths/min): 18 Photos: Wound Location: Left, Anterior Lower Leg Left, Medial Lower Leg Left, Lateral Lower Leg Wounding Event: Trauma Trauma Trauma Primary Etiology: Trauma, Other Trauma, Other Trauma, Other Comorbid History: Hypertension Hypertension Hypertension Date Acquired: 05/09/2020 05/09/2020 05/09/2020 Weeks of Treatment: 1 1 1  Wound Status: Open Open Open Measurements L x W x D (cm) 1.9x1.7x0.2 0.8x0.5x0.1 0.2x0.3x0.1 Area (cm) : 2.537 0.314 0.047 Volume (cm) : 0.507 0.031 0.005 % Reduction in Area: 42.30% 75.80% 0.00% % Reduction in Volume: 42.40% 76.20% 79.20% Classification: Full Thickness Without Exposed Full Thickness Without Exposed Full Thickness Without Exposed Support Structures Support Structures Support Structures Exudate Amount: Medium Medium None Present Exudate Type: Serosanguineous Serosanguineous N/A Exudate Color: red, brown red, brown N/A Granulation Amount: Medium (34-66%) Medium (34-66%) None Present (0%) Granulation Quality: Red, Pink Red,  Pink N/A Necrotic Amount: Medium (34-66%) Medium (34-66%) Large (67-100%) Necrotic Tissue: Adherent Slough Adherent Slough Eschar Exposed Structures: Fat Layer (Subcutaneous Tissue): Fat Layer (Subcutaneous Tissue): Fascia: No Yes Yes Fat Layer (Subcutaneous Tissue): Fascia: No Fascia: No No Tendon: No Tendon: No Tendon: No Muscle: No Muscle: No Muscle: No Joint: No Joint: No Joint: No Bone: No Bone: No Bone: No Epithelialization: None None None Treatment Notes Electronic Signature(s) Signed: 06/09/2020 5:09:07 PM By: Phillis HaggisSanchez Pereyda, Dondra PraderKenia RN Entered By:  Phillis Haggis, Kenia on 06/09/2020 15:53:49 Vawter, Sharon Milling ([id number]) -------------------------------------------------------------------------------- Multi-Disciplinary Care Plan Details Patient Name: Sharon Johnston, Sharon Johnston. Date of Service: 06/09/2020 2:45 PM Medical Record Number: [id number] Patient Account Number: [id number] Date of Birth/Sex: 26-Jun-1990 (29 y.o. Female) Treating RN: Sharon Johnston Primary Care Kenlee Maler: PATIENT, NO Other Clinician: Lolita Cram Referring Kabrina Christiano: Sharon Johnston Treating Abigail Teall/Extender: Sharon Johnston in Treatment: 1 Active Inactive Electronic Signature(s) Signed: 08/20/2020 2:06:45 PM By: Elliot Gurney BSN, RN, CWS, Kim RN, BSN Signed: 01/08/2021 5:08:20 PM By: Sharon Blocker RN Previous Signature: 06/09/2020 5:09:07 PM Version By: Phillis Haggis, Dondra Prader RN Entered By: Elliot Gurney, BSN, RN, CWS, Kim on 08/20/2020 14:06:45 Ohalloran, Sharon Milling ([id number]) -------------------------------------------------------------------------------- Pain Assessment Details Patient Name: Sharon Johnston, Sharon Johnston. Date of Service: 06/09/2020 2:45 PM Medical Record Number: [id number] Patient Account Number: [id number] Date of Birth/Sex: 24-Dec-1990 (29 y.o. Female) Treating RN: Sharon Johnston Primary Care Aricela Bertagnolli: PATIENT, NO Other Clinician: Lolita Cram Referring Yula Crotwell: Sharon Johnston Treating Madoc Holquin/Extender:  Sharon Johnston in Treatment: 1 Active Problems Location of Pain Severity and Description of Pain Patient Has Paino Yes Site Locations Rate the pain. Current Pain Level: 4 Worst Pain Level: 7 Least Pain Level: 4 Character of Pain Describe the Pain: Sharp Pain Management and Medication Current Pain Management: Electronic Signature(s) Signed: 06/09/2020 3:37:26 PM By: Lolita Cram Signed: 06/09/2020 5:09:07 PM By: Phillis Haggis, Dondra Prader RN Entered By: Lolita Cram on 06/09/2020 15:19:41 Strohecker, Sharon Milling ([id number]) -------------------------------------------------------------------------------- Patient/Caregiver Education Details Patient Name: Sharon Johnston. Date of Service: 06/09/2020 2:45 PM Medical Record Number: [id number] Patient Account Number: [id number] Date of Birth/Gender: 04-Nov-1990 (29 y.o. Female) Treating RN: Sharon Johnston Primary Care Physician: PATIENT, NO Other Clinician: Lolita Cram Referring Physician: Cherlynn Johnston Treating Physician/Extender: Sharon Johnston in Treatment: 1 Education Assessment Education Provided To: Patient Education Topics Provided Wound/Skin Impairment: Methods: Explain/Verbal Responses: State content correctly Notes dressing application education Electronic Signature(s) Signed: 06/09/2020 5:09:07 PM By: Phillis Haggis, Dondra Prader RN Entered By: Phillis Haggis, Dondra Prader on 06/09/2020 16:00:19 Hennon, Sharon Milling ([id number]) -------------------------------------------------------------------------------- Wound Assessment Details Patient Name: Sharon Johnston. Date of Service: 06/09/2020 2:45 PM Medical Record Number: [id number] Patient Account Number: [id number] Date of Birth/Sex: [birth year] (29 y.o. Female) Treating RN: Sharon Johnston Primary Care Daniell Paradise: PATIENT, NO Other Clinician: Lolita Cram Referring Janne Faulk: Sharon Johnston Treating Kamila Broda/Extender: Sharon Johnston in Treatment: 1 Wound Status Wound Number:  1 Primary Etiology: Trauma, Other Wound Location: Left, Anterior Lower Leg Wound Status: Open Wounding Event: Trauma Comorbid History: Hypertension Date Acquired: 05/09/2020 Weeks Of Treatment: 1 Clustered Wound: No Photos Wound Measurements Length: (cm) 1.9 Width: (cm) 1.7 Depth: (cm) 0.2 Area: (cm) 2.537 Volume: (cm) 0.507 % Reduction in Area: 42.3% % Reduction in Volume: 42.4% Epithelialization: None Tunneling: No Undermining: No Wound Description Classification: Full Thickness Without Exposed Support Structu Exudate Amount: Medium Exudate Type: Serosanguineous Exudate Color: red, brown res Foul Odor After Cleansing: No Slough/Fibrino Yes Wound Bed Granulation Amount: Medium (34-66%) Exposed Structure Granulation Quality: Red, Pink Fascia Exposed: No Necrotic Amount: Medium (34-66%) Fat Layer (Subcutaneous Tissue) Exposed: Yes Necrotic Quality: Adherent Slough Tendon Exposed: No Muscle Exposed: No Joint Exposed: No Bone Exposed: No Electronic Signature(s) Signed: 06/09/2020 3:37:26 PM By: Lolita Cram Signed: 06/09/2020 5:09:07 PM By: Phillis Haggis, Dondra Prader RN Entered By: Lolita Cram on 06/09/2020 15:28:35 Galan, Sharon Milling ([id number]) -------------------------------------------------------------------------------- Wound Assessment Details Patient Name: Sharon Johnston. Date of Service: 06/09/2020 2:45 PM Medical Record Number: [id number] Patient Account Number: [id number] Date of Birth/Sex: 05-06-1990 (29 y.o. Female) Treating RN: Sharon Johnston Primary  Care Joelly Bolanos: PATIENT, NO Other Clinician: Lolita Cram Referring Alan Drummer: Sharon Johnston Treating Bufford Helms/Extender: Sharon Johnston in Treatment: 1 Wound Status Wound Number: 2 Primary Etiology: Trauma, Other Wound Location: Left, Medial Lower Leg Wound Status: Open Wounding Event: Trauma Comorbid History: Hypertension Date Acquired: 05/09/2020 Weeks Of Treatment: 1 Clustered Wound:  No Photos Wound Measurements Length: (cm) 0.8 Width: (cm) 0.5 Depth: (cm) 0.1 Area: (cm) 0.314 Volume: (cm) 0.031 % Reduction in Area: 75.8% % Reduction in Volume: 76.2% Epithelialization: None Tunneling: No Undermining: No Wound Description Classification: Full Thickness Without Exposed Support Structu Exudate Amount: Medium Exudate Type: Serosanguineous Exudate Color: red, brown res Foul Odor After Cleansing: No Slough/Fibrino Yes Wound Bed Granulation Amount: Medium (34-66%) Exposed Structure Granulation Quality: Red, Pink Fascia Exposed: No Necrotic Amount: Medium (34-66%) Fat Layer (Subcutaneous Tissue) Exposed: Yes Necrotic Quality: Adherent Slough Tendon Exposed: No Muscle Exposed: No Joint Exposed: No Bone Exposed: No Electronic Signature(s) Signed: 06/09/2020 3:37:26 PM By: Lolita Cram Signed: 06/09/2020 5:09:07 PM By: Phillis Haggis, Dondra Prader RN Entered By: Lolita Cram on 06/09/2020 15:29:07 Mccarron, Sharon Milling ([id number]) -------------------------------------------------------------------------------- Wound Assessment Details Patient Name: Sharon Johnston. Date of Service: 06/09/2020 2:45 PM Medical Record Number: [id number] Patient Account Number: [id number] Date of Birth/Sex: Aug 11, 1990 (29 y.o. Female) Treating RN: Sharon Johnston Primary Care Christino Mcglinchey: PATIENT, NO Other Clinician: Lolita Cram Referring Yomaira Solar: Sharon Johnston Treating Butler Vegh/Extender: Sharon Johnston in Treatment: 1 Wound Status Wound Number: 3 Primary Etiology: Trauma, Other Wound Location: Left, Lateral Lower Leg Wound Status: Open Wounding Event: Trauma Comorbid History: Hypertension Date Acquired: 05/09/2020 Weeks Of Treatment: 1 Clustered Wound: No Photos Wound Measurements Length: (cm) Width: (cm) Depth: (cm) Area: (cm) Volume: (cm) 0 % Reduction in Area: 100% 0 % Reduction in Volume: 100% 0 Epithelialization: Large (67-100%) 0 Tunneling: No 0  Undermining: No Wound Description Classification: Full Thickness Without Exposed Support Structure Exudate Amount: None Present s Foul Odor After Cleansing: No Slough/Fibrino No Wound Bed Granulation Amount: None Present (0%) Exposed Structure Necrotic Amount: None Present (0%) Fascia Exposed: No Fat Layer (Subcutaneous Tissue) Exposed: No Tendon Exposed: No Muscle Exposed: No Joint Exposed: No Bone Exposed: No Electronic Signature(s) Signed: 06/09/2020 5:09:07 PM By: Lajean Manes RN Previous Signature: 06/09/2020 3:37:26 PM Version By: Lolita Cram Entered By: Phillis Haggis, Dondra Prader on 06/09/2020 15:56:43 Mota, Sharon Milling ([id number]) -------------------------------------------------------------------------------- Vitals Details Patient Name: Sharon Johnston. Date of Service: 06/09/2020 2:45 PM Medical Record Number: [id number] Patient Account Number: [id number] Date of Birth/Sex: 07/20/1990 (29 y.o. Female) Treating RN: Sharon Johnston Primary Care Karee Christopherson: PATIENT, NO Other Clinician: Lolita Cram Referring Jasun Gasparini: Sharon Johnston Treating Mariana Goytia/Extender: Sharon Johnston in Treatment: 1 Vital Signs Time Taken: 15:18 Temperature (F): 98.4 Height (in): 62 Pulse (bpm): 87 Weight (lbs): 120 Respiratory Rate (breaths/min): 18 Body Mass Index (BMI): 21.9 Blood Pressure (mmHg): 166/108 Reference Range: 80 - 120 mg / dl Electronic Signature(s) Signed: 06/09/2020 3:37:26 PM By: Lolita Cram Entered By: Lolita Cram on 06/09/2020 15:18:55
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
## 2020-06-16 ENCOUNTER — Ambulatory Visit: Payer: Medicaid - Out of State | Admitting: Physician Assistant
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
## 2020-06-23 ENCOUNTER — Ambulatory Visit: Payer: Medicaid - Out of State

## 2020-06-23 ENCOUNTER — Telehealth: Payer: Self-pay | Admitting: Neurology

## 2020-06-23 ENCOUNTER — Encounter: Payer: Self-pay | Admitting: Neurology

## 2020-06-23 ENCOUNTER — Ambulatory Visit: Payer: Medicaid - Out of State | Admitting: Neurology
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
## 2020-07-20 ENCOUNTER — Emergency Department (HOSPITAL_COMMUNITY): Payer: Medicaid - Out of State

## 2020-07-20 ENCOUNTER — Other Ambulatory Visit: Payer: Self-pay

## 2020-07-20 ENCOUNTER — Encounter (HOSPITAL_COMMUNITY): Payer: Self-pay

## 2020-07-20 ENCOUNTER — Emergency Department (HOSPITAL_COMMUNITY)
Admission: EM | Admit: 2020-07-20 | Discharge: 2020-07-20 | Disposition: A | Discharge status: Home / Self Care | Payer: Medicaid - Out of State | Attending: Emergency Medicine | Admitting: Emergency Medicine

## 2020-07-20 DIAGNOSIS — R102 Pelvic and perineal pain: Secondary | ICD-10-CM | POA: Insufficient documentation

## 2020-07-20 DIAGNOSIS — F17290 Nicotine dependence, other tobacco product, uncomplicated: Secondary | ICD-10-CM | POA: Insufficient documentation

## 2020-07-20 DIAGNOSIS — I10 Essential (primary) hypertension: Secondary | ICD-10-CM | POA: Diagnosis not present

## 2020-07-20 DIAGNOSIS — M79605 Pain in left leg: Secondary | ICD-10-CM | POA: Insufficient documentation

## 2020-07-20 DIAGNOSIS — N898 Other specified noninflammatory disorders of vagina: Secondary | ICD-10-CM | POA: Diagnosis present

## 2020-07-20 LAB — COMPREHENSIVE METABOLIC PANEL
ALT: 8 U/L (ref 0–44)
AST: 14 U/L — ABNORMAL LOW (ref 15–41)
Albumin: 4.2 g/dL (ref 3.5–5.0)
Alkaline Phosphatase: 56 U/L (ref 38–126)
Anion gap: 10 (ref 5–15)
BUN: 20 mg/dL (ref 6–20)
CO2: 26 mmol/L (ref 22–32)
Calcium: 9.6 mg/dL (ref 8.9–10.3)
Chloride: 107 mmol/L (ref 98–111)
Creatinine, Ser: 0.83 mg/dL (ref 0.44–1.00)
GFR, Estimated: >60 mL/min (ref >60)
Glucose, Bld: 90 mg/dL (ref 70–99)
Potassium: 3.9 mmol/L (ref 3.5–5.1)
Sodium: 143 mmol/L (ref 135–145)
Total Bilirubin: 0.6 mg/dL (ref 0.3–1.2)
Total Protein: 8 g/dL (ref 6.5–8.1)

## 2020-07-20 LAB — CBC WITH DIFFERENTIAL/PLATELET
Abs Immature Granulocytes: 0.01 10*3/uL (ref 0.00–0.07)
Basophils Absolute: 0 10*3/uL (ref 0.0–0.1)
Basophils Relative: 1 %
Eosinophils Absolute: 0.1 10*3/uL (ref 0.0–0.5)
Eosinophils Relative: 2 %
HCT: 36.9 % (ref 36.0–46.0)
Hemoglobin: 11.1 g/dL — ABNORMAL LOW (ref 12.0–15.0)
Immature Granulocytes: 0 %
Lymphocytes Relative: 25 %
Lymphs Abs: 1 10*3/uL (ref 0.7–4.0)
MCH: 26.1 pg (ref 26.0–34.0)
MCHC: 30.1 g/dL (ref 30.0–36.0)
MCV: 86.6 fL (ref 80.0–100.0)
Monocytes Absolute: 0.3 10*3/uL (ref 0.1–1.0)
Monocytes Relative: 7 %
Neutro Abs: 2.6 10*3/uL (ref 1.7–7.7)
Neutrophils Relative %: 65 %
Platelets: 340 10*3/uL (ref 150–400)
RBC: 4.26 MIL/uL (ref 3.87–5.11)
RDW: 14.3 % (ref 11.5–15.5)
WBC: 4.1 10*3/uL (ref 4.0–10.5)
nRBC: 0 % (ref 0.0–0.2)

## 2020-07-20 LAB — URINALYSIS, ROUTINE W REFLEX MICROSCOPIC
Bacteria, UA: NONE SEEN
Bilirubin Urine: NEGATIVE
Glucose, UA: NEGATIVE mg/dL
Ketones, ur: NEGATIVE mg/dL
Leukocytes,Ua: NEGATIVE
Nitrite: NEGATIVE
Protein, ur: NEGATIVE mg/dL
Specific Gravity, Urine: 1.026 (ref 1.005–1.030)
pH: 6 (ref 5.0–8.0)

## 2020-07-20 LAB — I-STAT BETA HCG BLOOD, ED (MC, WL, AP ONLY): I-stat hCG, quantitative: <5 m[IU]/mL (ref <5)

## 2020-07-20 LAB — LIPASE, BLOOD: Lipase: 33 U/L (ref 11–51)

## 2020-07-20 MED ORDER — DOXYCYCLINE HYCLATE 100 MG PO CAPS: 100.0000 mg | ORAL_CAPSULE | Freq: Two times a day (BID) | ORAL | 0 refills | Status: DC

## 2020-07-20 MED ORDER — IBUPROFEN 800 MG PO TABS
800.0000 mg | ORAL_TABLET | Freq: Once | ORAL | Status: AC
  Administered 2020-07-20: 800 mg via ORAL
  Filled 2020-07-20: qty 1

## 2020-07-20 MED ORDER — IBUPROFEN 600 MG PO TABS: 600.0000 mg | ORAL_TABLET | Freq: Four times a day (QID) | ORAL | 0 refills | Status: DC | PRN

## 2020-07-20 MED ORDER — CEFTRIAXONE SODIUM 1 G IJ SOLR
500.0000 mg | Freq: Once | INTRAMUSCULAR | Status: AC
  Administered 2020-07-20: 500 mg via INTRAMUSCULAR
  Filled 2020-07-20: qty 10

## 2020-07-20 MED ORDER — LIDOCAINE HCL 1 % IJ SOLN
INTRAMUSCULAR | Status: AC
  Administered 2020-07-20: 2.1 mL
  Filled 2020-07-20: qty 20
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
## 2020-07-20 NOTE — Discharge Instructions (Signed)
Please take antibiotic as prescribed for the full duration.  Take ibuprofen as needed for pain.  If you develop fever, increased redness or swelling to left lower leg then please return
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
## 2020-07-20 NOTE — ED Notes (Signed)
When this RN returned to the pt's room to given discharged papers, pt was gone. This RN looked in the hallways as well as in the lobby however pt appears to have left the ED. Attempted to call cellphone number listed in chart with no answer.

## 2020-08-04 ENCOUNTER — Emergency Department (HOSPITAL_COMMUNITY)
Admission: EM | Admit: 2020-08-04 | Discharge: 2020-08-04 | Disposition: A | Discharge status: Home / Self Care | Payer: Medicaid - Out of State | Attending: Emergency Medicine | Admitting: Emergency Medicine

## 2020-08-04 ENCOUNTER — Other Ambulatory Visit: Payer: Self-pay

## 2020-08-04 ENCOUNTER — Encounter (HOSPITAL_COMMUNITY): Payer: Self-pay

## 2020-08-04 DIAGNOSIS — F17210 Nicotine dependence, cigarettes, uncomplicated: Secondary | ICD-10-CM | POA: Insufficient documentation

## 2020-08-04 DIAGNOSIS — B9689 Other specified bacterial agents as the cause of diseases classified elsewhere: Secondary | ICD-10-CM

## 2020-08-04 DIAGNOSIS — I10 Essential (primary) hypertension: Secondary | ICD-10-CM | POA: Insufficient documentation

## 2020-08-04 DIAGNOSIS — R109 Unspecified abdominal pain: Secondary | ICD-10-CM | POA: Diagnosis present

## 2020-08-04 DIAGNOSIS — B373 Candidiasis of vulva and vagina: Secondary | ICD-10-CM | POA: Diagnosis not present

## 2020-08-04 DIAGNOSIS — N760 Acute vaginitis: Secondary | ICD-10-CM | POA: Insufficient documentation

## 2020-08-04 DIAGNOSIS — N898 Other specified noninflammatory disorders of vagina: Secondary | ICD-10-CM

## 2020-08-04 DIAGNOSIS — B3731 Acute candidiasis of vulva and vagina: Secondary | ICD-10-CM

## 2020-08-04 LAB — WET PREP, GENITAL
Sperm: NONE SEEN
Trich, Wet Prep: NONE SEEN

## 2020-08-04 LAB — URINALYSIS, ROUTINE W REFLEX MICROSCOPIC
Bacteria, UA: NONE SEEN
Bilirubin Urine: NEGATIVE
Glucose, UA: NEGATIVE mg/dL
Ketones, ur: NEGATIVE mg/dL
Leukocytes,Ua: NEGATIVE
Nitrite: NEGATIVE
Protein, ur: NEGATIVE mg/dL
Specific Gravity, Urine: 1.028 (ref 1.005–1.030)
pH: 5 (ref 5.0–8.0)

## 2020-08-04 LAB — I-STAT BETA HCG BLOOD, ED (MC, WL, AP ONLY): I-stat hCG, quantitative: <5 m[IU]/mL (ref <5)

## 2020-08-04 MED ORDER — METRONIDAZOLE 500 MG PO TABS: 500.0000 mg | ORAL_TABLET | Freq: Two times a day (BID) | ORAL | 0 refills | Status: DC

## 2020-08-04 MED ORDER — FLUCONAZOLE 150 MG PO TABS: 150.0000 mg | ORAL_TABLET | Freq: Once | ORAL | 0 refills | Status: AC

## 2020-08-04 MED ORDER — DOXYCYCLINE HYCLATE 100 MG PO CAPS: 100.0000 mg | ORAL_CAPSULE | Freq: Two times a day (BID) | ORAL | 0 refills | Status: DC

## 2020-08-04 MED ORDER — LIDOCAINE HCL 1 % IJ SOLN
INTRAMUSCULAR | Status: AC
  Administered 2020-08-04: 1 mL
  Filled 2020-08-04: qty 20

## 2020-08-04 MED ORDER — CEFTRIAXONE SODIUM 1 G IJ SOLR
500.0000 mg | Freq: Once | INTRAMUSCULAR | Status: AC
  Administered 2020-08-04: 500 mg via INTRAMUSCULAR
  Filled 2020-08-04: qty 10
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
## 2020-08-04 NOTE — ED Triage Notes (Signed)
Patient c/o lower abdominal pain and yellow vaginal discharge x 3 days.

## 2020-08-04 NOTE — Discharge Instructions (Signed)
You have been diagnosed with cervicitis, please take antibiotic as treatment for your infection which includes bacterial vaginosis, vaginal yeast infection, and potential gonorrhea/chlamydia infection.  Notify partner to get tested and treated as well.  Eat yogurt high in probiotic while taking antibiotic to decrease risk of having antibiotic associated diarrhea.  Do not drink alcohol while taking antibiotic

## 2020-08-04 NOTE — ED Notes (Signed)
Pelvic exam set up at bedside.  

## 2020-08-05 LAB — GC/CHLAMYDIA PROBE AMP (~~LOC~~) NOT AT ARMC
Chlamydia: NEGATIVE
Comment: NEGATIVE
Comment: NORMAL
Neisseria Gonorrhea: NEGATIVE

## 2020-08-05 LAB — HIV ANTIBODY (ROUTINE TESTING W REFLEX): HIV Screen 4th Generation wRfx: NONREACTIVE

## 2020-08-05 LAB — RPR: RPR Ser Ql: NONREACTIVE

## 2020-08-19 ENCOUNTER — Ambulatory Visit: Payer: Medicaid - Out of State | Admitting: Internal Medicine

## 2020-11-19 ENCOUNTER — Other Ambulatory Visit: Payer: Self-pay

## 2020-11-19 ENCOUNTER — Encounter (HOSPITAL_COMMUNITY): Payer: Self-pay | Admitting: Obstetrics & Gynecology

## 2020-11-19 ENCOUNTER — Inpatient Hospital Stay (HOSPITAL_COMMUNITY)
Admission: AD | Admit: 2020-11-19 | Discharge: 2020-11-20 | Disposition: A | Discharge status: Home / Self Care | Payer: Medicaid - Out of State | Attending: Obstetrics & Gynecology | Admitting: Obstetrics & Gynecology

## 2020-11-19 DIAGNOSIS — Z3490 Encounter for supervision of normal pregnancy, unspecified, unspecified trimester: Secondary | ICD-10-CM

## 2020-11-19 DIAGNOSIS — O10911 Unspecified pre-existing hypertension complicating pregnancy, first trimester: Secondary | ICD-10-CM | POA: Insufficient documentation

## 2020-11-19 DIAGNOSIS — O10919 Unspecified pre-existing hypertension complicating pregnancy, unspecified trimester: Secondary | ICD-10-CM

## 2020-11-19 DIAGNOSIS — Z3A01 Less than 8 weeks gestation of pregnancy: Secondary | ICD-10-CM | POA: Diagnosis not present

## 2020-11-19 DIAGNOSIS — I10 Essential (primary) hypertension: Secondary | ICD-10-CM | POA: Diagnosis present

## 2020-11-19 NOTE — MAU Provider Note (Signed)
MAU Provider Note  S [name] is a 30 y.o. [id number] at [redacted]w[redacted]d (unsure LMP of 10/16/20) patient who presents to MAU today for pregnancy confirmation. She denies all complaints including abdominal pain and bleeding, had cramping earlier today but not currently.  Had positive home pregnancy test. History of CHTN, was on Norvasc but ran out of medications long time ago.  Denies any abnormal vaginal discharge, fevers, chills, sweats, dysuria, nausea, vomiting, other GI or GU symptoms or other general symptoms.   O BP (!) 164/99 (BP Location: Right Arm)   Pulse 91   Temp 98.2 F (36.8 C)   Resp 16   Ht 5\' 3"  (1.6 m)   Wt 58.5 kg   LMP 10/16/2020   BMI 22.85 kg/m  Physical Exam VS reviewed, nursing note reviewed,  Constitutional: well developed, well nourished, no distress HEENT: normocephalic CV: normal rate Pulm: normal effort Abdomen: soft, NT Neuro: alert and oriented x 3 Skin: warm, dry Psych: affect normal   Results for orders placed or performed during the hospital encounter of 11/19/20 (from the past 24 hour(s))  Pregnancy, urine POC     Status: Abnormal   Collection Time: 11/19/20 11:57 PM  Result Value Ref Range   Preg Test, Ur POSITIVE (A) NEGATIVE  Urinalysis, Routine w reflex microscopic Urine, Clean Catch     Status: Abnormal   Collection Time: 11/19/20 11:59 PM  Result Value Ref Range   Color, Urine YELLOW YELLOW   APPearance HAZY (A) CLEAR   Specific Gravity, Urine 1.021 1.005 - 1.030   pH 5.0 5.0 - 8.0   Glucose, UA NEGATIVE NEGATIVE mg/dL   Hgb urine dipstick MODERATE (A) NEGATIVE   Bilirubin Urine NEGATIVE NEGATIVE   Ketones, ur NEGATIVE NEGATIVE mg/dL   Protein, ur NEGATIVE NEGATIVE mg/dL   Nitrite NEGATIVE NEGATIVE   Leukocytes,Ua TRACE (A) NEGATIVE   RBC / HPF 0-5 0 - 5 RBC/hpf   WBC, UA 0-5 0 - 5 WBC/hpf   Bacteria, UA MANY (A) NONE SEEN   Squamous Epithelial / LPF 6-10 0 - 5   Mucus PRESENT     A Medical screening exam complete Pregnancy  confirmed  P Prescribed prenatal vitamins, Procardia XL for CHTN. No headaches or neurologic symptoms. Outpatient viability scan ordered for 2 weeks from now, message sent to [facility] to help schedule this and also to schedule NOB appointment in about 3-4 weeks. Equivocal UA, urine culture sent, will follow up results and manage accordingly. Denies any urinary symptoms.  Pregnancy confirmation letter given to patient.  Warning signs for worsening condition that would warrant emergency follow-up discussed Patient may return to MAU as needed    [facility], MD, FACOG Obstetrician & Gynecologist, [facility] for [facility]

## 2020-11-19 NOTE — MAU Note (Signed)
Took upt 2days ago and it was positive. Was cramping earlier at work but no pain now and no bleeding. LMP 10/16/20

## 2020-11-20 ENCOUNTER — Encounter: Payer: Self-pay | Admitting: Obstetrics & Gynecology

## 2020-11-20 ENCOUNTER — Encounter (HOSPITAL_COMMUNITY): Payer: Self-pay | Admitting: *Deleted

## 2020-11-20 DIAGNOSIS — O10911 Unspecified pre-existing hypertension complicating pregnancy, first trimester: Secondary | ICD-10-CM

## 2020-11-20 DIAGNOSIS — Z3A01 Less than 8 weeks gestation of pregnancy: Secondary | ICD-10-CM

## 2020-11-20 LAB — URINALYSIS, ROUTINE W REFLEX MICROSCOPIC
Bilirubin Urine: NEGATIVE
Glucose, UA: NEGATIVE mg/dL
Ketones, ur: NEGATIVE mg/dL
Nitrite: NEGATIVE
Protein, ur: NEGATIVE mg/dL
Specific Gravity, Urine: 1.021 (ref 1.005–1.030)
pH: 5 (ref 5.0–8.0)

## 2020-11-20 LAB — POCT PREGNANCY, URINE: Preg Test, Ur: POSITIVE — AB

## 2020-11-20 MED ORDER — PRENATAL VITAMIN 27-0.8 MG PO TABS: 1.0000 | ORAL_TABLET | Freq: Every day | ORAL | 12 refills | Status: DC

## 2020-11-20 MED ORDER — NIFEDIPINE ER OSMOTIC RELEASE 30 MG PO TB24: 30.0000 mg | ORAL_TABLET | Freq: Every day | ORAL | 5 refills | Status: DC

## 2020-11-20 MED ORDER — NIFEDIPINE ER OSMOTIC RELEASE 30 MG PO TB24: 30.0000 mg | ORAL_TABLET | Freq: Every day | ORAL | 3 refills | Status: DC

## 2020-11-20 MED ORDER — PRENATAL VITAMIN 27-0.8 MG PO TABS: 1.0000 | ORAL_TABLET | Freq: Every day | ORAL | 13 refills | Status: DC

## 2020-11-20 NOTE — MAU Note (Signed)
Dr Macon Large in Triage to see pt and discuss plan of care. MD aware pt has hx elevated b/p but is not taking any b/p meds.

## 2020-11-20 NOTE — Progress Notes (Signed)
Written and verbal d/c instructions given and understanding voiced. 

## 2020-11-22 LAB — CULTURE, OB URINE: Culture: 70000 — AB

## 2020-11-23 ENCOUNTER — Other Ambulatory Visit: Payer: Self-pay | Admitting: Student

## 2020-11-23 DIAGNOSIS — N3000 Acute cystitis without hematuria: Secondary | ICD-10-CM

## 2020-11-23 MED ORDER — CEFADROXIL 500 MG PO CAPS: 500.0000 mg | ORAL_CAPSULE | Freq: Two times a day (BID) | ORAL | 0 refills | Status: DC

## 2020-12-18 DIAGNOSIS — Z3480 Encounter for supervision of other normal pregnancy, unspecified trimester: Secondary | ICD-10-CM | POA: Insufficient documentation

## 2020-12-21 DIAGNOSIS — Z3480 Encounter for supervision of other normal pregnancy, unspecified trimester: Secondary | ICD-10-CM

## 2020-12-24 ENCOUNTER — Ambulatory Visit (INDEPENDENT_AMBULATORY_CARE_PROVIDER_SITE_OTHER): Payer: PRIVATE HEALTH INSURANCE

## 2020-12-24 VITALS — BP 152/98 | HR 76

## 2020-12-24 DIAGNOSIS — Z3480 Encounter for supervision of other normal pregnancy, unspecified trimester: Secondary | ICD-10-CM

## 2020-12-24 MED ORDER — GOJJI WEIGHT SCALE MISC: 1.0000 | 0 refills | Status: DC | PRN

## 2020-12-24 MED ORDER — BLOOD PRESSURE KIT DEVI: 1.0000 | 0 refills | Status: DC | PRN

## 2020-12-24 MED ORDER — GOJJI WEIGHT SCALE MISC: 1.0000 | Status: DC | PRN

## 2020-12-24 NOTE — Progress Notes (Signed)
, ..  New OB Intake  I connected with  Sharon Johnston on 12/24/20 at  9:00 AM EDT and verified that I am speaking with the correct person using two identifiers. Nurse is located at [facility] and pt is located at in office.  I discussed the limitations, risks, security and privacy concerns of performing an evaluation and management service by telephone and the availability of in person appointments. I also discussed with the patient that there may be a patient responsible charge related to this service. The patient expressed understanding and agreed to proceed.  I explained I am completing New OB Intake today. We discussed her EDD of 07-23-2021 that is based on LMP of 10/16/20. Pt is G8/P4. I reviewed her allergies, medications, Medical/Surgical/OB history, and appropriate screenings. I informed her of [facility] services. Based on history, this is a/an  pregnancy complicated by hypertension .   Patient Active Problem List   Diagnosis Date Noted   Supervision of other normal pregnancy, antepartum 12/18/2020   Chronic hypertension 10/31/2015   UTI (urinary tract infection) 07/19/2014   Depression 06/15/2013    Concerns addressed today  Pt started on Nifedipine after MAU visit on 11/19/20, pt states that she has been taking it daily but forgot this morning. BP today is 152/98, in office provider notified. Pt denies any abnormal symptoms. Advised to take rx as soon as possible and report if any abnormal symptoms occur, pt agreed.  Pt is in the process of getting out of state Medicaid changed.  PHQ-9=7, pt would like referral to counseling services, referral placed, Pt states FOB is not involved  Delivery Plans:  Plans to deliver at [facility].   MyChart/Babyscripts MyChart access verified. I explained pt will have some visits in office and some virtually. Babyscripts instructions given and order placed. Patient verifies receipt of registration text/e-mail. Account successfully created and app  downloaded.  Blood Pressure Cuff  Blood pressure cuff ordered for patient to pick-up from [employer]. Explained after first prenatal appt pt will check weekly and document in Babyscripts.  Weight scale: Patient does not have weight scale. Weight scale ordered for patient to pick up form [facility].   Anatomy US Explained first scheduled [place] will be around 19 weeks. Anatomy US appointment to be determined.  Labs Discussed Avelina Laine genetic screening with patient. Would like both Panorama and Horizon drawn at new OB visit. Routine prenatal labs needed.  Covid Vaccine Patient has not covid vaccine.   Mother/ Baby Dyad Candidate?    If yes, offer as possibility  Informed patient of [facility] website  and placed link in her AVS.   Social Determinants of Health Food Insecurity: Patient denies food insecurity. WIC Referral: Patient is not interested in referral to [facility].  Transportation: Patient denies transportation needs. Childcare: Discussed no children allowed at ultrasound appointments. Offered childcare services; patient declines childcare services at this time.  Send link to Pregnancy Navigators   Placed OB Box on problem list and updated  First visit review I reviewed new OB appt with pt. I explained she will have a pelvic exam, ob bloodwork with genetic screening, and PAP smear. Explained pt will be seen by Dr. Debroah Loop at first visit; encounter routed to appropriate provider. Explained that patient will be seen by pregnancy navigator following visit with provider. [facility] information placed in AVS.   Katrina Stack, RN 12/24/2020  9:06 AM

## 2020-12-24 NOTE — Progress Notes (Signed)
Agree with nurses's documentation of this patient's clinic encounter.  Chrystie Hagwood L, MD  

## 2020-12-29 ENCOUNTER — Ambulatory Visit (INDEPENDENT_AMBULATORY_CARE_PROVIDER_SITE_OTHER): Payer: Self-pay | Admitting: Obstetrics & Gynecology

## 2020-12-29 ENCOUNTER — Ambulatory Visit (INDEPENDENT_AMBULATORY_CARE_PROVIDER_SITE_OTHER): Payer: Medicaid Other | Admitting: Licensed Clinical Social Worker

## 2020-12-29 ENCOUNTER — Other Ambulatory Visit (HOSPITAL_COMMUNITY)
Admission: RE | Admit: 2020-12-29 | Discharge: 2020-12-29 | Disposition: A | Discharge status: Home / Self Care | Payer: Medicaid - Out of State | Source: Ambulatory Visit

## 2020-12-29 ENCOUNTER — Other Ambulatory Visit: Payer: Self-pay

## 2020-12-29 VITALS — BP 124/77 | HR 65 | Wt 131.9 lb

## 2020-12-29 DIAGNOSIS — O99340 Other mental disorders complicating pregnancy, unspecified trimester: Secondary | ICD-10-CM

## 2020-12-29 DIAGNOSIS — Z3480 Encounter for supervision of other normal pregnancy, unspecified trimester: Secondary | ICD-10-CM

## 2020-12-29 DIAGNOSIS — I10 Essential (primary) hypertension: Secondary | ICD-10-CM

## 2020-12-29 DIAGNOSIS — F32A Depression, unspecified: Secondary | ICD-10-CM

## 2020-12-29 DIAGNOSIS — Z658 Other specified problems related to psychosocial circumstances: Secondary | ICD-10-CM | POA: Diagnosis not present

## 2020-12-29 MED ORDER — PROMETHAZINE HCL 25 MG PO TABS: 25.0000 mg | ORAL_TABLET | Freq: Four times a day (QID) | ORAL | 1 refills | Status: DC | PRN

## 2020-12-29 MED ORDER — ASPIRIN EC 81 MG PO TBEC: 81.0000 mg | DELAYED_RELEASE_TABLET | Freq: Every day | ORAL | 3 refills | Status: DC

## 2020-12-29 NOTE — Progress Notes (Signed)
New OB OB panel, OB urine, GC/CC, Pap today Depression and anxiety screen, positive for anxiety. Has referral to [facility].

## 2020-12-29 NOTE — Progress Notes (Signed)
Subjective:NOB s/p [place] [facility]    Sharon Johnston is a [id number] [redacted]w[redacted]d being seen today for her first obstetrical visit.  Her obstetrical history is significant for  multipara . Patient does not intend to breast feed. Pregnancy history fully reviewed.  Patient reports nausea.  Vitals:   12/29/20 1023  BP: 124/77  Pulse: 65  Weight: 131 lb 14.4 oz (59.8 kg)    HISTORY: OB History  Gravida Para Term Preterm AB Living  8 4 4  0 3 4  SAB IAB Ectopic Multiple Live Births  2 1 0 0 4    # Outcome Date GA Lbr Len/2nd Weight Sex Delivery Anes PTL Lv  8 Current           7 Term 04/23/17 [redacted]w[redacted]d 10:04 / 01:24 7 lb 8.3 oz (3.41 kg) M Vag-Spont EPI  LIV  6 IAB 2017 [redacted]w[redacted]d         5 Term 10/08/14 [redacted]w[redacted]d 02:40 / 00:04 5 lb 14.4 oz (2.676 kg) F Vag-Spont None  LIV  4 Term 08/11/13 [redacted]w[redacted]d 13:48 / 00:08 7 lb 3.7 oz (3.28 kg) F Vag-Spont EPI  LIV  3 Term 10/18/11 [redacted]w[redacted]d 12:30 / 00:30 7 lb 8 oz (3.402 kg) M Vag-Spont EPI  LIV  2 SAB 2013 [redacted]w[redacted]d            Birth Comments: System Generated. Please review and update pregnancy details.  1 SAB      SAB      Past Medical History:  Diagnosis Date   Chlamydia    Depression    hospitalized for 2 weeks after suicide att was post partum   Hx of gonorrhea    Hypertension    gestational HTN   Kidney infection    Past Surgical History:  Procedure Laterality Date   NO PAST SURGERIES     Family History  Problem Relation Age of Onset   Hypertension Mother    Hypertension Maternal Grandmother    Healthy Father    Hypertension Maternal Aunt    Depression Cousin      Exam    Uterus:   11 week  Pelvic Exam:    Perineum: No Hemorrhoids   Vulva: normal   Vagina:  thin grey discharge   pH:     Cervix: no lesions   Adnexa: normal adnexa   Bony Pelvis: average  System: Breast:  Inspection negative   Skin: normal coloration and turgor, no rashes    Neurologic: normal, normal mood   Extremities: normal strength, tone, and muscle mass    HEENT PERRLA   Mouth/Teeth mucous membranes moist, pharynx normal without lesions   Neck supple   Cardiovascular: regular rate and rhythm, no murmurs or gallops   Respiratory:  appears well, vitals normal, no respiratory distress, acyanotic, normal RR, neck free of mass or lymphadenopathy, chest clear, no wheezing, crepitations, rhonchi, normal symmetric air entry   Abdomen: soft, non-tender; bowel sounds normal; no masses,  no organomegaly   Urinary: urethral meatus normal      Assessment:    Pregnancy: [redacted]w[redacted]d Patient Active Problem List   Diagnosis Date Noted   Supervision of other normal pregnancy, antepartum 12/18/2020   Chronic hypertension 10/31/2015   UTI (urinary tract infection) 07/19/2014   Depression 06/15/2013        Plan:     Initial labs drawn. Prenatal vitamins. Problem list reviewed and updated. Genetic Screening discussed Natera screenordered.  Ultrasound discussed; fetal survey: ordered.  Follow up  in 4 weeks. 50% of 30 min visit spent on counseling and coordination of care.     Scheryl Darter 12/29/2020

## 2020-12-30 LAB — COMPREHENSIVE METABOLIC PANEL
ALT: 11 IU/L (ref 0–32)
AST: 14 IU/L (ref 0–40)
Albumin/Globulin Ratio: 1.6 (ref 1.2–2.2)
Albumin: 4.5 g/dL (ref 3.9–5.0)
Alkaline Phosphatase: 43 IU/L — ABNORMAL LOW (ref 44–121)
BUN/Creatinine Ratio: 21 (ref 9–23)
BUN: 13 mg/dL (ref 6–20)
Bilirubin Total: 0.4 mg/dL (ref 0.0–1.2)
CO2: 17 mmol/L — ABNORMAL LOW (ref 20–29)
Calcium: 10.4 mg/dL — ABNORMAL HIGH (ref 8.7–10.2)
Chloride: 101 mmol/L (ref 96–106)
Creatinine, Ser: 0.62 mg/dL (ref 0.57–1.00)
Globulin, Total: 2.9 g/dL (ref 1.5–4.5)
Glucose: 77 mg/dL (ref 70–99)
Potassium: 3.9 mmol/L (ref 3.5–5.2)
Sodium: 137 mmol/L (ref 134–144)
Total Protein: 7.4 g/dL (ref 6.0–8.5)
eGFR: 125 mL/min/{1.73_m2} (ref >59)

## 2020-12-30 LAB — CBC/D/PLT+RPR+RH+ABO+RUBIGG...
Antibody Screen: NEGATIVE
Basophils Absolute: 0 10*3/uL (ref 0.0–0.2)
Basos: 1 %
EOS (ABSOLUTE): 0.1 10*3/uL (ref 0.0–0.4)
Eos: 2 %
HCV Ab: <0.1 s/co ratio (ref 0.0–0.9)
HIV Screen 4th Generation wRfx: NONREACTIVE
Hematocrit: 34.9 % (ref 34.0–46.6)
Hemoglobin: 11.2 g/dL (ref 11.1–15.9)
Hepatitis B Surface Ag: NEGATIVE
Immature Grans (Abs): 0 10*3/uL (ref 0.0–0.1)
Immature Granulocytes: 0 %
Lymphocytes Absolute: 1 10*3/uL (ref 0.7–3.1)
Lymphs: 24 %
MCH: 27.1 pg (ref 26.6–33.0)
MCHC: 32.1 g/dL (ref 31.5–35.7)
MCV: 85 fL (ref 79–97)
Monocytes Absolute: 0.3 10*3/uL (ref 0.1–0.9)
Monocytes: 8 %
Neutrophils Absolute: 2.9 10*3/uL (ref 1.4–7.0)
Neutrophils: 65 %
Platelets: 315 10*3/uL (ref 150–450)
RBC: 4.13 x10E6/uL (ref 3.77–5.28)
RDW: 14.4 % (ref 11.7–15.4)
RPR Ser Ql: NONREACTIVE
Rh Factor: POSITIVE
Rubella Antibodies, IGG: 1.76 index (ref >0.99)
WBC: 4.3 10*3/uL (ref 3.4–10.8)

## 2020-12-30 LAB — CYTOLOGY - PAP: Diagnosis: NEGATIVE

## 2020-12-30 LAB — HCV INTERPRETATION

## 2020-12-30 LAB — CERVICOVAGINAL ANCILLARY ONLY
Chlamydia: NEGATIVE
Comment: NEGATIVE
Comment: NORMAL
Neisseria Gonorrhea: NEGATIVE

## 2020-12-30 NOTE — BH Specialist Note (Signed)
Integrated Behavioral Health Initial In-Person Visit  MRN: [id number] Name: Sharon Johnston  Number of Integrated Behavioral Health Clinician visits:: 1/6 Session Start time: 11:00am  Session End time: 11:17am Total time: 17 minutes in person at [facility]   Types of Service: General Behavioral Integrated Care (BHI)  Interpretor:No. Interpretor Name and Language: None    Warm Hand Off Completed.        Subjective: Sharon Johnston is a 30 y.o. female accompanied by n/a Patient was referred by B.Stallings RN  for depressed mood and psychosocial stressor. Patient reports the following symptoms/concerns: depressed mood fluctuating, stressors, over thinking and difficulty sleeping  Duration of problem: approx 2 months ; Severity of problem: mild  Objective: Mood: Good and Affect: Appropriate Risk of harm to self or others: No plan to harm self or others  Life Context: Family and Social: Lives with grandmother  School/Work: n/a Self-Care: n/a Life Changes: new pregnancy   Patient and/or Family's Strengths/Protective Factors: Concrete supports in place (healthy food, safe environments, etc.)  Goals Addressed: Patient will: Reduce symptoms of: mood instability and stress depressed mood Increase knowledge and/or ability of: coping skills  Demonstrate ability to: Increase healthy adjustment to current life circumstances  Progress towards Goals: Ongoing  Interventions: Interventions utilized: Supportive Counseling  Standardized Assessments completed: PHQ 9  Patient and/or Family Response:  Ms. Brotzman reports difficulty sleeping due to fleeting thoughts. Ms. Urbas reports thoughts of self harm. Ms. Stehr did not report if father of baby is supportive or involved. Ms. Henegar reports a desire to move into her own apartment. LCSW A. Felton Clinton provided information regarding local income based apartments.    Assessment: Patient currently experiencing depression affecting pregnancy and  psychosocial stressor .   Patient may benefit from integrated behavioral health.  Plan: Follow up with behavioral health clinician on : as needed  Behavioral recommendations: Contact housing resources given during appt, take prenatal vitamins, prioritize rest, engage in mediation and mindfulness to decrease fleeting thoughts and decrease social media  Referral(s): Integrated [employer] (In Clinic) "From scale of 1-10, how likely are you to follow plan?":   Sharon Saxon, LCSW

## 2020-12-31 LAB — CULTURE, OB URINE

## 2020-12-31 LAB — URINE CULTURE, OB REFLEX

## 2021-01-04 ENCOUNTER — Encounter: Payer: Self-pay | Admitting: Obstetrics & Gynecology

## 2021-01-05 ENCOUNTER — Telehealth: Payer: Self-pay | Admitting: Advanced Practice Midwife

## 2021-01-05 NOTE — Telephone Encounter (Signed)
I called patient to follow up on urine lab results from 12/29/20.  Pt had TOC that showed mixed urogenital flora.  She reports symptoms of odor and vaginal discharge that is white and some itching.  Given her vaginal symptoms, I recommend a vaginal swab for BV, yeast and trichomonas, testing for gonorrhea/chlamydia were negative 10/11.  Message sent to set up nurse visit tomorrow, 01/06/21 or pt to call office to set up.

## 2021-01-06 ENCOUNTER — Ambulatory Visit: Payer: PRIVATE HEALTH INSURANCE

## 2021-01-06 ENCOUNTER — Other Ambulatory Visit: Payer: Self-pay

## 2021-01-06 DIAGNOSIS — N3000 Acute cystitis without hematuria: Secondary | ICD-10-CM

## 2021-01-06 MED ORDER — NITROFURANTOIN MONOHYD MACRO 100 MG PO CAPS: 100.0000 mg | ORAL_CAPSULE | Freq: Two times a day (BID) | ORAL | 0 refills | Status: DC

## 2021-01-11 ENCOUNTER — Telehealth: Payer: Self-pay | Admitting: *Deleted

## 2021-01-11 ENCOUNTER — Encounter: Payer: Self-pay | Admitting: Obstetrics & Gynecology

## 2021-01-11 NOTE — Telephone Encounter (Signed)
Pt had questions about genetic results. Call placed to pt and questions answered.  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

## 2021-01-26 ENCOUNTER — Encounter: Payer: PRIVATE HEALTH INSURANCE | Admitting: Obstetrics and Gynecology

## 2021-02-01 ENCOUNTER — Ambulatory Visit (INDEPENDENT_AMBULATORY_CARE_PROVIDER_SITE_OTHER): Payer: PRIVATE HEALTH INSURANCE | Admitting: Obstetrics and Gynecology

## 2021-02-01 ENCOUNTER — Other Ambulatory Visit: Payer: Self-pay

## 2021-02-01 VITALS — BP 135/85 | HR 88 | Wt 137.9 lb

## 2021-02-01 DIAGNOSIS — Z3480 Encounter for supervision of other normal pregnancy, unspecified trimester: Secondary | ICD-10-CM

## 2021-02-01 DIAGNOSIS — Z3A16 16 weeks gestation of pregnancy: Secondary | ICD-10-CM

## 2021-02-01 DIAGNOSIS — I10 Essential (primary) hypertension: Secondary | ICD-10-CM

## 2021-02-01 NOTE — Progress Notes (Signed)
   PRENATAL VISIT NOTE  Subjective:  Sharon Johnston is a 30 y.o. A1P3790 at [redacted]w[redacted]d being seen today for ongoing prenatal care.  She is currently monitored for the following issues for this high-risk pregnancy and has Depression; UTI (urinary tract infection); Chronic hypertension; Supervision of other normal pregnancy, antepartum; and [redacted] weeks gestation of pregnancy on their problem list.  Patient doing well with no acute concerns today. She reports no complaints.  Contractions: Not present. Vag. Bleeding: None.   . Denies leaking of fluid.  BP med not taken today, pt advised to take medication daily.  The following portions of the patient's history were reviewed and updated as appropriate: allergies, current medications, past family history, past medical history, past social history, past surgical history and problem list. Problem list updated.  Objective:   Vitals:   02/01/21 1357  BP: 135/85  Pulse: 88  Weight: 137 lb 14.4 oz (62.6 kg)    Fetal Status: Fetal Heart Rate (bpm): 150 Fundal Height: 16 cm       General:  Alert, oriented and cooperative. Patient is in no acute distress.  Skin: Skin is warm and dry. No rash noted.   Cardiovascular: Normal heart rate noted  Respiratory: Normal respiratory effort, no problems with respiration noted  Abdomen: Soft, gravid, appropriate for gestational age.  Pain/Pressure: Absent     Pelvic: Cervical exam deferred        Extremities: Normal range of motion.  Edema: Trace  Mental Status:  Normal mood and affect. Normal behavior. Normal judgment and thought content.   Assessment and Plan:  Pregnancy: [id number] at [redacted]w[redacted]d  1. [redacted] weeks gestation of pregnancy   2. Chronic hypertension Continue procardia, will reassess BP control once back on medication  3. Supervision of other normal pregnancy, antepartum Continue routine care. AFP at next visit  Preterm labor symptoms and general obstetric precautions including but not limited to vaginal  bleeding, contractions, leaking of fluid and fetal movement were reviewed in detail with the patient.  Please refer to After Visit Summary for other counseling recommendations.   Return in about 4 weeks (around 03/01/2021) for ROB, in person.   Mariel Aloe, MD Faculty Attending [facility]

## 2021-02-22 ENCOUNTER — Other Ambulatory Visit: Payer: Self-pay | Admitting: *Deleted

## 2021-02-22 ENCOUNTER — Ambulatory Visit (HOSPITAL_BASED_OUTPATIENT_CLINIC_OR_DEPARTMENT_OTHER): Payer: Medicaid Other | Admitting: Maternal & Fetal Medicine

## 2021-02-22 ENCOUNTER — Other Ambulatory Visit: Payer: Self-pay | Admitting: Obstetrics & Gynecology

## 2021-02-22 ENCOUNTER — Ambulatory Visit: Payer: Medicaid Other | Attending: Obstetrics & Gynecology

## 2021-02-22 ENCOUNTER — Other Ambulatory Visit: Payer: Self-pay

## 2021-02-22 DIAGNOSIS — I10 Essential (primary) hypertension: Secondary | ICD-10-CM | POA: Diagnosis not present

## 2021-02-22 DIAGNOSIS — Z3A19 19 weeks gestation of pregnancy: Secondary | ICD-10-CM | POA: Diagnosis not present

## 2021-02-22 DIAGNOSIS — Z3480 Encounter for supervision of other normal pregnancy, unspecified trimester: Secondary | ICD-10-CM

## 2021-02-22 DIAGNOSIS — O10912 Unspecified pre-existing hypertension complicating pregnancy, second trimester: Secondary | ICD-10-CM | POA: Diagnosis not present

## 2021-02-22 DIAGNOSIS — O368390 Maternal care for abnormalities of the fetal heart rate or rhythm, unspecified trimester, not applicable or unspecified: Secondary | ICD-10-CM | POA: Diagnosis not present

## 2021-02-22 DIAGNOSIS — Z363 Encounter for antenatal screening for malformations: Secondary | ICD-10-CM | POA: Insufficient documentation

## 2021-02-22 DIAGNOSIS — Z148 Genetic carrier of other disease: Secondary | ICD-10-CM | POA: Insufficient documentation

## 2021-02-22 DIAGNOSIS — O10012 Pre-existing essential hypertension complicating pregnancy, second trimester: Secondary | ICD-10-CM | POA: Insufficient documentation

## 2021-02-22 NOTE — Progress Notes (Signed)
MFM Brief Note  Sharon Johnston is a 30 yo G8 P4 who is here at [redacted]w[redacted]d who is here at the request of Dr. Scheryl Darter for detailed ultrasound due to chronic hypertension.   Sharon Johnston reports that she has no s/sx of preeclampsia. She continues to take low dose ASA and daily procardia.   Vitals with BMI 02/01/2021 12/29/2020 12/24/2020  Height - - -  Weight 137 lbs 14 oz 131 lbs 14 oz -  BMI - - -  Systolic 135 124 494  Diastolic 85 77 98  Pulse 88 65 76    Today a single intrauterine pregnancy was observed with measurements consistent with dates. There was normal anatomy without markers of aneuploidy. There was good fetal movement and amniotic fluid volume.  Today we observed what we suspect are premature atrial contractions. The PAC's' were regularly irregular occurring every 5-15 beats.    Impression/Counseling:  Chronic hypertension: We discussed the increased risk for preeclampsia, placental abruption and fetal growth restriction in women with chronic hypertension. She is taking low dose ASA for preeclampsia prevention. I explained that serial growth exams should be performed every 4 weeks throughout the pregnancy. Medical therapy should be initiated if blood pressure persist >150/100's. If medical therapy is initiated we recommend weekly testing at 32 weeks. She is aware of the s/sx or preeclampsia including headache, vision changes and right upper quadrant pain.  Baseline labs of UPC, CBC, and CMP should be performed if not performed previously.   Premature contractions: Premature atrial contractions (PACs), the most common fetal arrhythmia, manifest as an irregularly irregular variability in fetal heart rate, usually beginning between 18 and 24 weeks' gestation, but occasionally appearing initially during the third trimester. Rarely, PACs are associated with intermittent SVT. PACs may be exacerbated by ingestion of caffeine, decongestant medications (stimulants), or tobacco. Sharon Johnston  reports smoking heavy tobacco and caffeine daily.  Typically, PACs resolve spontaneously within 2 to 3 weeks after diagnosis. PACs do not represent any real risk to the fetus, and they do not require treatment. However, a small percentage of fetuses with isolated PACs develop supraventricular tachycardia (SVT). We recommend weekly monitoring.  She is scheduled to return in 4-6 weeks for growth given her chronic hypertension.  She reports that she will discontinue her tobacco use and caffeine intake.  I spent 30 minutes with > 50% in face to face consultation.  Novella Olive, MD.

## 2021-03-01 ENCOUNTER — Encounter: Payer: Medicaid Other | Admitting: Advanced Practice Midwife

## 2021-03-01 NOTE — Progress Notes (Deleted)
   PRENATAL VISIT NOTE  Subjective:  Sharon Johnston is a 30 y.o. [id number] at [redacted]w[redacted]d being seen today for ongoing prenatal care.  She is currently monitored for the following issues for this {Blank single:19197::"high-risk","low-risk"} pregnancy and has Depression; UTI (urinary tract infection); Chronic hypertension; Supervision of other normal pregnancy, antepartum; and [redacted] weeks gestation of pregnancy on their problem list.  Patient reports {sx:14538}.   .  .   . Denies leaking of fluid.   The following portions of the patient's history were reviewed and updated as appropriate: allergies, current medications, past family history, past medical history, past social history, past surgical history and problem list.   Objective:  There were no vitals filed for this visit.  Fetal Status:           General:  Alert, oriented and cooperative. Patient is in no acute distress.  Skin: Skin is warm and dry. No rash noted.   Cardiovascular: Normal heart rate noted  Respiratory: Normal respiratory effort, no problems with respiration noted  Abdomen: Soft, gravid, appropriate for gestational age.        Pelvic: {Blank single:19197::"Cervical exam performed in the presence of a chaperone","Cervical exam deferred"}        Extremities: Normal range of motion.     Mental Status: Normal mood and affect. Normal behavior. Normal judgment and thought content.   Assessment and Plan:  Pregnancy: [id number] at [redacted]w[redacted]d 1. Supervision of high risk pregnancy in second trimester ***  2. Chronic hypertension ***  3. [redacted] weeks gestation of pregnancy ***  4. Abnormal fetal heart rate complicating pregnancy --PACs on 12/5 [place] --Recommened f/u [ ]    {Blank single:19197::"Term","Preterm"} labor symptoms and general obstetric precautions including but not limited to vaginal bleeding, contractions, leaking of fluid and fetal movement were reviewed in detail with the patient. Please refer to After Visit Summary for other  counseling recommendations.   No follow-ups on file.  Future Appointments  Date Time Provider Department Center  03/01/2021 11:15 AM 14/02/2021, CNM [facility] None  04/05/2021 11:15 AM [facility] NURSE [facility]  04/05/2021 11:30 AM [facility]    04/07/2021, CNM

## 2021-03-03 ENCOUNTER — Encounter: Payer: Self-pay | Admitting: Obstetrics and Gynecology

## 2021-03-18 ENCOUNTER — Telehealth (INDEPENDENT_AMBULATORY_CARE_PROVIDER_SITE_OTHER): Payer: Medicaid Other | Admitting: Medical

## 2021-03-18 ENCOUNTER — Ambulatory Visit (INDEPENDENT_AMBULATORY_CARE_PROVIDER_SITE_OTHER): Payer: Medicaid Other | Admitting: Licensed Clinical Social Worker

## 2021-03-18 ENCOUNTER — Encounter: Payer: Self-pay | Admitting: Medical

## 2021-03-18 DIAGNOSIS — O99340 Other mental disorders complicating pregnancy, unspecified trimester: Secondary | ICD-10-CM

## 2021-03-18 DIAGNOSIS — O368390 Maternal care for abnormalities of the fetal heart rate or rhythm, unspecified trimester, not applicable or unspecified: Secondary | ICD-10-CM | POA: Insufficient documentation

## 2021-03-18 DIAGNOSIS — I10 Essential (primary) hypertension: Secondary | ICD-10-CM

## 2021-03-18 DIAGNOSIS — Z658 Other specified problems related to psychosocial circumstances: Secondary | ICD-10-CM | POA: Diagnosis not present

## 2021-03-18 DIAGNOSIS — O368320 Maternal care for abnormalities of the fetal heart rate or rhythm, second trimester, not applicable or unspecified: Secondary | ICD-10-CM

## 2021-03-18 DIAGNOSIS — N898 Other specified noninflammatory disorders of vagina: Secondary | ICD-10-CM

## 2021-03-18 DIAGNOSIS — F32A Depression, unspecified: Secondary | ICD-10-CM | POA: Diagnosis not present

## 2021-03-18 DIAGNOSIS — Z3480 Encounter for supervision of other normal pregnancy, unspecified trimester: Secondary | ICD-10-CM

## 2021-03-18 DIAGNOSIS — O26892 Other specified pregnancy related conditions, second trimester: Secondary | ICD-10-CM

## 2021-03-18 DIAGNOSIS — N3000 Acute cystitis without hematuria: Secondary | ICD-10-CM

## 2021-03-18 DIAGNOSIS — R300 Dysuria: Secondary | ICD-10-CM

## 2021-03-18 DIAGNOSIS — O2312 Infections of bladder in pregnancy, second trimester: Secondary | ICD-10-CM

## 2021-03-18 DIAGNOSIS — O162 Unspecified maternal hypertension, second trimester: Secondary | ICD-10-CM

## 2021-03-18 DIAGNOSIS — Z3A22 22 weeks gestation of pregnancy: Secondary | ICD-10-CM

## 2021-03-18 MED ORDER — ASPIRIN EC 81 MG PO TBEC: 81.0000 mg | DELAYED_RELEASE_TABLET | Freq: Every day | ORAL | 3 refills | Status: DC

## 2021-03-18 MED ORDER — METRONIDAZOLE 500 MG PO TABS: 500.0000 mg | ORAL_TABLET | Freq: Two times a day (BID) | ORAL | 0 refills | Status: DC

## 2021-03-18 MED ORDER — NITROFURANTOIN MONOHYD MACRO 100 MG PO CAPS: 100.0000 mg | ORAL_CAPSULE | Freq: Two times a day (BID) | ORAL | 0 refills | Status: DC

## 2021-03-18 MED ORDER — NIFEDIPINE ER OSMOTIC RELEASE 30 MG PO TB24: 30.0000 mg | ORAL_TABLET | Freq: Every day | ORAL | 5 refills | Status: DC

## 2021-03-18 MED ORDER — TERCONAZOLE 0.4 % VA CREA: 1.0000 | TOPICAL_CREAM | Freq: Every day | VAGINAL | 0 refills | Status: DC

## 2021-03-18 NOTE — Progress Notes (Signed)
I connected with  Sharon Johnston on 03/18/21 by a video enabled telemedicine application and verified that I am speaking with the correct person using two identifiers.   I discussed the limitations of evaluation and management by telemedicine. The patient expressed understanding and agreed to proceed.   ROB c/o white malodorous discharge.

## 2021-03-18 NOTE — Progress Notes (Signed)
I connected with Sharon Johnston 03/18/21 at 10:35 AM EST by: MyChart video and verified that I am speaking with the correct person using two identifiers.  Patient is located at home and provider is located at [facility].     The purpose of this virtual visit is to provide medical care while limiting exposure to the novel coronavirus. I discussed the limitations, risks, security and privacy concerns of performing an evaluation and management service by MyChart video and the availability of in person appointments. I also discussed with the patient that there may be a patient responsible charge related to this service. By engaging in this virtual visit, you consent to the provision of healthcare.  Additionally, you authorize for your insurance to be billed for the services provided during this visit.  The patient expressed understanding and agreed to proceed.  The following staff members participated in the virtual visit:  Georgana Curio, CMA    PRENATAL VISIT NOTE  Subjective:  Sharon Johnston is a 30 y.o. [id number] at [redacted]w[redacted]d  for phone visit for ongoing prenatal care.  She is currently monitored for the following issues for this high-risk pregnancy and has Depression; UTI (urinary tract infection); Chronic hypertension; Supervision of other normal pregnancy, antepartum; and Fetal arrhythmia affecting pregnancy, antepartum on their problem list.  Patient reports dysuria, vaignal discharge with odor.  Contractions: Not present. Vag. Bleeding: None.  Movement: Present. Denies leaking of fluid.   The following portions of the patient's history were reviewed and updated as appropriate: allergies, current medications, past family history, past medical history, past social history, past surgical history and problem list.   Objective:  There were no vitals filed for this visit. Self-Obtained  Fetal Status:     Movement: Present     Assessment and Plan:  Pregnancy: A6T0160 at [redacted]w[redacted]d 1. Supervision of other  normal pregnancy, antepartum - Planning to bottle feed - Desires BTL, advised to sign consent at next visit  - Uses [facility] for other children  2. Chronic hypertension - Needs BP cuff given at next visit, does not have BP cuff at home today  - Has not taken Procardia in a few weeks since she ran out and did not refill - Never started ASA, new Rx sent  - aspirin EC 81 MG tablet; Take 1 tablet (81 mg total) by mouth daily. Swallow whole.  Dispense: 100 tablet; Refill: 3 - NIFEdipine (PROCARDIA-XL/NIFEDICAL-XL) 30 MG 24 hr tablet; Take 1 tablet (30 mg total) by mouth daily.  Dispense: 30 tablet; Refill: 5  3. Vaginal odor - metroNIDAZOLE (FLAGYL) 500 MG tablet; Take 1 tablet (500 mg total) by mouth 2 (two) times daily.  Dispense: 14 tablet; Refill: 0 - terconazole (TERAZOL 7) 0.4 % vaginal cream; Place 1 applicator vaginally at bedtime.  Dispense: 45 g; Refill: 0 - Rx for yeast sent to be used if symptoms develop due to multiple antibiotic use  4. Dysuria during pregnancy in second trimester - Frequent UTIs, patient requesting treatment based on symptoms of dysuria today  - Rx sent - nitrofurantoin, macrocrystal-monohydrate, (MACROBID) 100 MG capsule; Take 1 capsule (100 mg total) by mouth 2 (two) times daily.  Dispense: 10 capsule; Refill: 0 - Patient advised to call office if symptoms do not improve by mid next week so we can obtain culture  - Warning signs for pyelonephritis reviewed  - nitrofurantoin, macrocrystal-monohydrate, (MACROBID) 100 MG capsule; Take 1 capsule (100 mg total) by mouth 2 (two) times daily.  Dispense: 10 capsule; Refill:  0  5. Fetal arrhythmia affecting pregnancy, antepartum - Noted on [place] on 02/22/21 - Per MFM, follow-up scheduled 04/05/21  Preterm labor symptoms and general obstetric precautions including but not limited to vaginal bleeding, contractions, leaking of fluid and fetal movement were reviewed in detail with the patient.  Return in about 4 weeks  (around 04/15/2021) for [facility] APP, In-Person, 28 week labs (fasting), any provider.  Future Appointments  Date Time Provider Department Center  04/05/2021 11:15 AM [facility] NURSE [facility]  04/05/2021 11:30 AM [facility] US3 [facility]     Time spent on virtual visit: 20 minutes  Vonzella Nipple, PA-C

## 2021-03-21 NOTE — L&D Delivery Note (Signed)
Delivery Note ?Sharon Johnston is a 31 y.o. [id number] at [redacted]w[redacted]d admitted for IOL d/t cHTN with SI pre-e.  ? ?GBS Status: unknown   ?Maximum Maternal Temperature: 99.1 ? ?Labor course: Initial SVE: 1.5/50/ballotable. Augmentation with: AROM, Pitocin, and Cytotec. She then progressed to complete.  ?ROM: 0h 32m with clear fluid ? ?Birth: At 1536 a viable female was delivered via spontaneous vaginal delivery (Presentation: cephalic; ROA). Nuchal cord present: No.  Shoulders and body delivered in usual fashion. Infant placed directly on mom's abdomen for bonding/skin-to-skin, baby dried and stimulated. Cord clamped x 2 after 1 minute and cut by patient's mother.  Cord blood collected. The placenta separated spontaneously and delivered via gentle cord traction.  Pitocin infused rapidly IV per protocol.  Fundus firm with massage.  ?Placenta inspected and appears to be intact with a 3 VC.  Placenta/Cord with the following complications: none.  Cord pH: b ?Sponge and instrument count were correct x2. ? ?Intrapartum complications:  IUGR and cHTN with SI pre-e ?Anesthesia:  epidural ?Episiotomy: none ?Lacerations:  none ?Suture Repair:  n/a ?EBL (mL): 200 ? ? ?Infant: ?APGAR (1 MIN): 8  ?APGAR (5 MINS): 8  ?Infant weight: pending ? ?Mom to  [facility] .  Baby to Couplet care / Skin to Skin. Placenta to pathology ?Plans to Bottlefeed ?Contraception: tubal ligation ?Circumcision: N/A ? ?Note sent to [facility]: [facility] for pp visit. ? ?Renee Harder CNM ?06/22/2021 ?4:05 PM ? ? ? ?

## 2021-03-22 NOTE — BH Specialist Note (Signed)
Integrated Behavioral Health via Telemedicine Visit  03/22/2021 Sharon Johnston [id number]  Number of Integrated Behavioral Health visits: 2 Session Start time: 12:50pm Session End time: 1:19pm Total time: 29 mins via mychart video   Referring Provider: Dr. Alysia Penna  Patient/Family location: Home  [facility] Provider location: [facility]  All persons participating in visit: Pt A Earlene Plater and LCSW A Rebecca Motta  Types of Service: Individual psychotherapy and Video visit  I connected with Betzayda [name] and/or Florabelle N Band's n/a via  Telephone or Video Enabled Telemedicine Application  (Video is Caregility application) and verified that I am speaking with the correct Johnston using two identifiers. Discussed confidentiality: Yes   I discussed the limitations of telemedicine and the availability of in Johnston appointments.  Discussed there is a possibility of technology failure and discussed alternative modes of communication if that failure occurs.  I discussed that engaging in this telemedicine visit, they consent to the provision of behavioral healthcare and the services will be billed under their insurance.  Patient and/or legal guardian expressed understanding and consented to Telemedicine visit: Yes   Presenting Concerns: Patient and/or family reports the following symptoms/concerns: depressed mood, social stressors,limited support, feeling overwhelmed, and difficulty sleeping  Duration of problem: over one year ; Severity of problem: mild  Patient and/or Family's Strengths/Protective Factors: Concrete supports in place (healthy food, safe environments, etc.)  Goals Addressed: Patient will:  Reduce symptoms of: depression   Increase knowledge and/or ability of: coping skills and stress reduction   Demonstrate ability to: Increase adequate support systems for patient/family  Progress towards Goals: Ongoing  Interventions: Interventions utilized:  Supportive Counseling Standardized Assessments  completed: n/a  Patient and/or Family Response: Ms. Dake reports depressed and irritable mood due to lack of social support. Ms. Felker reports she often feels overwhelmed and worries a lot which contributing to her depressed mood   Assessment: Patient currently experiencing depression affecting pregnancy.   Patient may benefit from integrated behavioral health.  Plan: Follow up with behavioral health clinician on : 2 weeks via mychart Behavioral recommendations: delegate task with older children such as light household task for added support, schedule self care to boost mood, prioritize rest, keep medical appts and take prescribed medication as directed  Referral(s): Integrated [employer] (In Clinic)  I discussed the assessment and treatment plan with the patient and/or parent/guardian. They were provided an opportunity to ask questions and all were answered. They agreed with the plan and demonstrated an understanding of the instructions.   They were advised to call back or seek an in-Johnston evaluation if the symptoms worsen or if the condition fails to improve as anticipated.  Gwyndolyn Saxon, LCSW

## 2021-04-05 ENCOUNTER — Ambulatory Visit: Payer: Medicaid Other | Attending: Maternal & Fetal Medicine

## 2021-04-05 ENCOUNTER — Ambulatory Visit: Payer: Medicaid - Out of State

## 2021-04-15 ENCOUNTER — Encounter: Payer: Self-pay | Admitting: Medical

## 2021-04-15 ENCOUNTER — Other Ambulatory Visit: Payer: Medicaid Other

## 2021-04-15 ENCOUNTER — Ambulatory Visit (INDEPENDENT_AMBULATORY_CARE_PROVIDER_SITE_OTHER): Payer: Medicaid Other | Admitting: Medical

## 2021-04-15 ENCOUNTER — Other Ambulatory Visit: Payer: Self-pay

## 2021-04-15 VITALS — BP 129/79 | HR 66 | Wt 154.2 lb

## 2021-04-15 DIAGNOSIS — I10 Essential (primary) hypertension: Secondary | ICD-10-CM

## 2021-04-15 DIAGNOSIS — O368390 Maternal care for abnormalities of the fetal heart rate or rhythm, unspecified trimester, not applicable or unspecified: Secondary | ICD-10-CM

## 2021-04-15 DIAGNOSIS — O99342 Other mental disorders complicating pregnancy, second trimester: Secondary | ICD-10-CM

## 2021-04-15 DIAGNOSIS — Z3480 Encounter for supervision of other normal pregnancy, unspecified trimester: Secondary | ICD-10-CM

## 2021-04-15 DIAGNOSIS — Z3A26 26 weeks gestation of pregnancy: Secondary | ICD-10-CM

## 2021-04-15 DIAGNOSIS — F32A Depression, unspecified: Secondary | ICD-10-CM

## 2021-04-15 NOTE — Progress Notes (Signed)
° °  PRENATAL VISIT NOTE  Subjective:  Sharon Johnston is a 31 y.o. [id number] at [redacted]w[redacted]d being seen today for ongoing prenatal care.  She is currently monitored for the following issues for this high-risk pregnancy and has Depression; UTI (urinary tract infection); Chronic hypertension; Supervision of other normal pregnancy, antepartum; and Fetal arrhythmia affecting pregnancy, antepartum on their problem list.  Patient reports no complaints.  Contractions: Not present. Vag. Bleeding: None.  Movement: Present. Denies leaking of fluid.   The following portions of the patient's history were reviewed and updated as appropriate: allergies, current medications, past family history, past medical history, past social history, past surgical history and problem list.   Objective:   Vitals:   04/15/21 0941  BP: 129/79  Pulse: 66  Weight: 154 lb 3.2 oz (69.9 kg)    Fetal Status: Fetal Heart Rate (bpm): 145 Fundal Height: 27 cm Movement: Present     General:  Alert, oriented and cooperative. Patient is in no acute distress.  Skin: Skin is warm and dry. No rash noted.   Cardiovascular: Normal heart rate noted  Respiratory: Normal respiratory effort, no problems with respiration noted  Abdomen: Soft, gravid, appropriate for gestational age.  Pain/Pressure: Absent     Pelvic: Cervical exam deferred        Extremities: Normal range of motion.  Edema: Trace  Mental Status: Normal mood and affect. Normal behavior. Normal judgment and thought content.   Assessment and Plan:  Pregnancy: [id number] at [redacted]w[redacted]d 1. Supervision of other normal pregnancy, antepartum - Glucose Tolerance, 2 Hours w/1 Hour - RPR - CBC - HIV antibody (with reflex) - Would like to defer TDAP to next visit  - Declined flu today  - Plan to sign BTL consent today with RN  2. Fetal arrhythmia affecting pregnancy, antepartum - Was unable to keep follow-up US on 1/16, will reschedule today   3. Chronic hypertension - Taking Procarida  and ASA - Normotensive today  - Given BP cuff for future virtual visits   4. Depression during pregnancy in second trimester - Will schedule follow-up with Seth Bake   5. [redacted] weeks gestation of pregnancy  Preterm labor symptoms and general obstetric precautions including but not limited to vaginal bleeding, contractions, leaking of fluid and fetal movement were reviewed in detail with the patient. Please refer to After Visit Summary for other counseling recommendations.   Return in about 2 weeks (around 04/29/2021) for [facility] APP, In-Person.  No future appointments.  Kerry Hough, PA-C

## 2021-04-15 NOTE — Progress Notes (Signed)
Pt reports fetal movement, denies pain.  

## 2021-04-16 ENCOUNTER — Ambulatory Visit (INDEPENDENT_AMBULATORY_CARE_PROVIDER_SITE_OTHER): Payer: Medicaid Other | Admitting: Licensed Clinical Social Worker

## 2021-04-16 DIAGNOSIS — O99342 Other mental disorders complicating pregnancy, second trimester: Secondary | ICD-10-CM

## 2021-04-16 DIAGNOSIS — F32A Depression, unspecified: Secondary | ICD-10-CM | POA: Diagnosis not present

## 2021-04-16 LAB — CBC
Hematocrit: 31.4 % — ABNORMAL LOW (ref 34.0–46.6)
Hemoglobin: 10.2 g/dL — ABNORMAL LOW (ref 11.1–15.9)
MCH: 27.5 pg (ref 26.6–33.0)
MCHC: 32.5 g/dL (ref 31.5–35.7)
MCV: 85 fL (ref 79–97)
Platelets: 302 10*3/uL (ref 150–450)
RBC: 3.71 x10E6/uL — ABNORMAL LOW (ref 3.77–5.28)
RDW: 12.5 % (ref 11.7–15.4)
WBC: 5.2 10*3/uL (ref 3.4–10.8)

## 2021-04-16 LAB — GLUCOSE TOLERANCE, 2 HOURS W/ 1HR
Glucose, 1 hour: 96 mg/dL (ref 70–179)
Glucose, 2 hour: 101 mg/dL (ref 70–152)
Glucose, Fasting: 78 mg/dL (ref 70–91)

## 2021-04-16 LAB — HIV ANTIBODY (ROUTINE TESTING W REFLEX): HIV Screen 4th Generation wRfx: NONREACTIVE

## 2021-04-16 LAB — RPR: RPR Ser Ql: NONREACTIVE

## 2021-04-16 NOTE — BH Specialist Note (Signed)
Integrated Behavioral Health via Telemedicine Visit  04/16/2021 Sharon Johnston [id number]  Number of Integrated Behavioral Health visits: 1 Session Start time: 9:45am  Session End time: 10:20am Total time: 35 mins via phone per pt request   Referring Provider: Harlon Flor PA-C Patient/Family location: Home [facility] Provider location: [facility]  All persons participating in visit: Pt A Sada and LCSW A. Sarie Stall Types of Service: Individual psychotherapy and Telephone visit  I connected with Kenslei [name] and/or Franceska N Lukin's n/a via  Telephone or Video Enabled Telemedicine Application  (Video is Caregility application) and verified that I am speaking with the correct person using two identifiers. Discussed confidentiality: Yes   I discussed the limitations of telemedicine and the availability of in person appointments.  Discussed there is a possibility of technology failure and discussed alternative modes of communication if that failure occurs.  I discussed that engaging in this telemedicine visit, they consent to the provision of behavioral healthcare and the services will be billed under their insurance.  Patient and/or legal guardian expressed understanding and consented to Telemedicine visit: Yes   Presenting Concerns: Patient and/or family reports the following symptoms/concerns: depressed mood, feeling overwhelmed, difficulty sleeping, family conflict and feeling on edge  Duration of problem: approx one year  ; Severity of problem: mild  Patient and/or Family's Strengths/Protective Factors: Concrete supports in place (healthy food, safe environments, etc.)  Goals Addressed: Patient will:  Reduce symptoms of: depression and stress   Increase knowledge and/or ability of: coping skills and stress reduction   Demonstrate ability to: Increase healthy adjustment to current life circumstances and Increase adequate support systems for patient/family  Progress towards  Goals: Ongoing  Interventions: Interventions utilized:  Supportive Counseling Standardized Assessments completed: n/a  Patient and/or Family Response: Sharon Johnston responded well to visit  Assessment: Patient currently experiencing depression affecting pregnancy and situational stress .   Patient may benefit from integrated behavioral health.  Plan: Follow up with behavioral health clinician on : 05/13/2021 Behavioral recommendations: delegate task with older children such as light household task for added support, schedule self care to boost mood, prioritize rest, keep medical appts and take prescribed medication as directed  Referral(s): Integrated [facility] [facility] (In Clinic)  I discussed the assessment and treatment plan with the patient and/or parent/guardian. They were provided an opportunity to ask questions and all were answered. They agreed with the plan and demonstrated an understanding of the instructions.   They were advised to call back or seek an in-person evaluation if the symptoms worsen or if the condition fails to improve as anticipated.  Sharon Saxon, LCSW

## 2021-04-22 ENCOUNTER — Ambulatory Visit: Payer: Medicaid Other | Admitting: *Deleted

## 2021-04-22 ENCOUNTER — Ambulatory Visit: Payer: Medicaid Other | Attending: Maternal & Fetal Medicine

## 2021-04-22 ENCOUNTER — Other Ambulatory Visit: Payer: Self-pay

## 2021-04-22 VITALS — BP 130/64 | HR 69

## 2021-04-22 DIAGNOSIS — Z148 Genetic carrier of other disease: Secondary | ICD-10-CM | POA: Diagnosis not present

## 2021-04-22 DIAGNOSIS — O368390 Maternal care for abnormalities of the fetal heart rate or rhythm, unspecified trimester, not applicable or unspecified: Secondary | ICD-10-CM

## 2021-04-22 DIAGNOSIS — O368320 Maternal care for abnormalities of the fetal heart rate or rhythm, second trimester, not applicable or unspecified: Secondary | ICD-10-CM | POA: Diagnosis not present

## 2021-04-22 DIAGNOSIS — Z3480 Encounter for supervision of other normal pregnancy, unspecified trimester: Secondary | ICD-10-CM

## 2021-04-22 DIAGNOSIS — Z3A27 27 weeks gestation of pregnancy: Secondary | ICD-10-CM | POA: Diagnosis not present

## 2021-04-22 DIAGNOSIS — O10012 Pre-existing essential hypertension complicating pregnancy, second trimester: Secondary | ICD-10-CM | POA: Diagnosis present

## 2021-04-26 ENCOUNTER — Other Ambulatory Visit: Payer: Self-pay | Admitting: *Deleted

## 2021-04-26 DIAGNOSIS — O10919 Unspecified pre-existing hypertension complicating pregnancy, unspecified trimester: Secondary | ICD-10-CM

## 2021-04-26 DIAGNOSIS — O368390 Maternal care for abnormalities of the fetal heart rate or rhythm, unspecified trimester, not applicable or unspecified: Secondary | ICD-10-CM

## 2021-04-28 NOTE — Progress Notes (Deleted)
° °  PRENATAL VISIT NOTE  Subjective:  Sharon Johnston is a 31 y.o. [id number] at [redacted]w[redacted]d being seen today for ongoing prenatal care.  She is currently monitored for the following issues for this high-risk pregnancy and has Depression; UTI (urinary tract infection); Chronic hypertension; Supervision of other normal pregnancy, antepartum; and Fetal arrhythmia affecting pregnancy, antepartum on their problem list.  Patient reports {[id number]}.   .  .   . Denies leaking of fluid.   The following portions of the patient's history were reviewed and updated as appropriate: allergies, current medications, past family history, past medical history, past social history, past surgical history and problem list.   Objective:  There were no vitals filed for this visit.  Fetal Status:           General:  Alert, oriented and cooperative. Patient is in no acute distress.  Skin: Skin is warm and dry. No rash noted.   Cardiovascular: Normal heart rate noted  Respiratory: Normal respiratory effort, no problems with respiration noted  Abdomen: Soft, gravid, appropriate for gestational age.        Pelvic: Cervical exam deferred        Extremities: Normal range of motion.     Mental Status: Normal mood and affect. Normal behavior. Normal judgment and thought content.   Assessment and Plan:  Pregnancy: [id number] at [redacted]w[redacted]d 1. Chronic hypertension - Well controlled on Procardia - Taking ldASA - Serial growth started - growth on 2/2 was normal at 19%. Next [place] is 3/2 and she will start BPP the following week.   2. Acute cystitis without hematuria - Ucx TOC was negative  3. Depression during pregnancy in third trimester - Following with Seth Bake  4. Supervision of other normal pregnancy, antepartum - 28 wk labs were normal. Mild anemia noted. Discussed and will have her start Iron PO *** - tdap offered and recommended today - ***  5. Fetal arrhythmia affecting pregnancy, antepartum - Normal rhythm on f/u  US  Preterm labor symptoms and general obstetric precautions including but not limited to vaginal bleeding, contractions, leaking of fluid and fetal movement were reviewed in detail with the patient. Please refer to After Visit Summary for other counseling recommendations.   No follow-ups on file.  Future Appointments  Date Time Provider [place]  04/29/2021  1:50 PM Radene Gunning, MD [place] None  05/20/2021 12:30 PM [facility] NURSE [facility]  05/20/2021 12:45 PM [facility] US5 [facility]  05/27/2021 12:30 PM [facility] NURSE [facility]  05/27/2021 12:45 PM [facility] US5 [facility]  06/03/2021 12:30 PM [facility] NURSE [facility]  06/03/2021 12:45 PM [facility] US5 [facility] [place]    Radene Gunning, MD

## 2021-04-29 ENCOUNTER — Encounter: Payer: Medicaid Other | Admitting: Obstetrics and Gynecology

## 2021-04-29 DIAGNOSIS — O99342 Other mental disorders complicating pregnancy, second trimester: Secondary | ICD-10-CM

## 2021-04-29 DIAGNOSIS — O368390 Maternal care for abnormalities of the fetal heart rate or rhythm, unspecified trimester, not applicable or unspecified: Secondary | ICD-10-CM

## 2021-04-29 DIAGNOSIS — I10 Essential (primary) hypertension: Secondary | ICD-10-CM

## 2021-04-29 DIAGNOSIS — N3000 Acute cystitis without hematuria: Secondary | ICD-10-CM

## 2021-04-29 DIAGNOSIS — Z3480 Encounter for supervision of other normal pregnancy, unspecified trimester: Secondary | ICD-10-CM

## 2021-05-13 ENCOUNTER — Other Ambulatory Visit: Payer: Self-pay

## 2021-05-13 ENCOUNTER — Observation Stay (HOSPITAL_COMMUNITY)
Admission: EM | Admit: 2021-05-13 | Discharge: 2021-05-14 | Disposition: A | Discharge status: Home / Self Care | Payer: Medicaid Other | Attending: Obstetrics and Gynecology | Admitting: Obstetrics and Gynecology

## 2021-05-13 ENCOUNTER — Emergency Department (HOSPITAL_COMMUNITY): Payer: Medicaid Other

## 2021-05-13 ENCOUNTER — Encounter (HOSPITAL_COMMUNITY): Payer: Self-pay

## 2021-05-13 DIAGNOSIS — M25572 Pain in left ankle and joints of left foot: Secondary | ICD-10-CM | POA: Diagnosis not present

## 2021-05-13 DIAGNOSIS — M545 Low back pain, unspecified: Secondary | ICD-10-CM

## 2021-05-13 DIAGNOSIS — O163 Unspecified maternal hypertension, third trimester: Secondary | ICD-10-CM | POA: Insufficient documentation

## 2021-05-13 DIAGNOSIS — Z3A30 30 weeks gestation of pregnancy: Secondary | ICD-10-CM | POA: Diagnosis not present

## 2021-05-13 DIAGNOSIS — R1030 Lower abdominal pain, unspecified: Secondary | ICD-10-CM | POA: Insufficient documentation

## 2021-05-13 DIAGNOSIS — O99013 Anemia complicating pregnancy, third trimester: Secondary | ICD-10-CM | POA: Diagnosis not present

## 2021-05-13 DIAGNOSIS — M25562 Pain in left knee: Secondary | ICD-10-CM | POA: Diagnosis not present

## 2021-05-13 DIAGNOSIS — Y92410 Unspecified street and highway as the place of occurrence of the external cause: Secondary | ICD-10-CM | POA: Insufficient documentation

## 2021-05-13 DIAGNOSIS — Z79899 Other long term (current) drug therapy: Secondary | ICD-10-CM | POA: Diagnosis not present

## 2021-05-13 DIAGNOSIS — F17210 Nicotine dependence, cigarettes, uncomplicated: Secondary | ICD-10-CM | POA: Insufficient documentation

## 2021-05-13 DIAGNOSIS — Z7982 Long term (current) use of aspirin: Secondary | ICD-10-CM | POA: Diagnosis not present

## 2021-05-13 DIAGNOSIS — O99333 Smoking (tobacco) complicating pregnancy, third trimester: Secondary | ICD-10-CM | POA: Diagnosis not present

## 2021-05-13 DIAGNOSIS — Z20822 Contact with and (suspected) exposure to covid-19: Secondary | ICD-10-CM | POA: Diagnosis not present

## 2021-05-13 DIAGNOSIS — I10 Essential (primary) hypertension: Secondary | ICD-10-CM | POA: Diagnosis present

## 2021-05-13 DIAGNOSIS — Z3480 Encounter for supervision of other normal pregnancy, unspecified trimester: Secondary | ICD-10-CM

## 2021-05-13 DIAGNOSIS — O9A213 Injury, poisoning and certain other consequences of external causes complicating pregnancy, third trimester: Secondary | ICD-10-CM | POA: Diagnosis present

## 2021-05-13 LAB — CBC
HCT: 29.1 % — ABNORMAL LOW (ref 36.0–46.0)
Hemoglobin: 9.3 g/dL — ABNORMAL LOW (ref 12.0–15.0)
MCH: 27.8 pg (ref 26.0–34.0)
MCHC: 32 g/dL (ref 30.0–36.0)
MCV: 86.9 fL (ref 80.0–100.0)
Platelets: 277 10*3/uL (ref 150–400)
RBC: 3.35 MIL/uL — ABNORMAL LOW (ref 3.87–5.11)
RDW: 12.8 % (ref 11.5–15.5)
WBC: 5.5 10*3/uL (ref 4.0–10.5)
nRBC: 0 % (ref 0.0–0.2)

## 2021-05-13 LAB — I-STAT CHEM 8, ED
BUN: 6 mg/dL (ref 6–20)
Calcium, Ion: 1.06 mmol/L — ABNORMAL LOW (ref 1.15–1.40)
Chloride: 107 mmol/L (ref 98–111)
Creatinine, Ser: 0.5 mg/dL (ref 0.44–1.00)
Glucose, Bld: 82 mg/dL (ref 70–99)
HCT: 29 % — ABNORMAL LOW (ref 36.0–46.0)
Hemoglobin: 9.9 g/dL — ABNORMAL LOW (ref 12.0–15.0)
Potassium: 3.7 mmol/L (ref 3.5–5.1)
Sodium: 137 mmol/L (ref 135–145)
TCO2: 20 mmol/L — ABNORMAL LOW (ref 22–32)

## 2021-05-13 LAB — RESP PANEL BY RT-PCR (FLU A&B, COVID) ARPGX2
Influenza A by PCR: NEGATIVE
Influenza B by PCR: NEGATIVE
SARS Coronavirus 2 by RT PCR: NEGATIVE

## 2021-05-13 LAB — COMPREHENSIVE METABOLIC PANEL
ALT: 14 U/L (ref 0–44)
AST: 17 U/L (ref 15–41)
Albumin: 2.9 g/dL — ABNORMAL LOW (ref 3.5–5.0)
Alkaline Phosphatase: 66 U/L (ref 38–126)
Anion gap: 7 (ref 5–15)
BUN: 6 mg/dL (ref 6–20)
CO2: 19 mmol/L — ABNORMAL LOW (ref 22–32)
Calcium: 7.8 mg/dL — ABNORMAL LOW (ref 8.9–10.3)
Chloride: 106 mmol/L (ref 98–111)
Creatinine, Ser: 0.6 mg/dL (ref 0.44–1.00)
GFR, Estimated: >60 mL/min (ref >60)
Glucose, Bld: 85 mg/dL (ref 70–99)
Potassium: 3.6 mmol/L (ref 3.5–5.1)
Sodium: 132 mmol/L — ABNORMAL LOW (ref 135–145)
Total Bilirubin: 0.3 mg/dL (ref 0.3–1.2)
Total Protein: 6.4 g/dL — ABNORMAL LOW (ref 6.5–8.1)

## 2021-05-13 LAB — PROTIME-INR
INR: 1.5 — ABNORMAL HIGH (ref 0.8–1.2)
Prothrombin Time: 18.2 seconds — ABNORMAL HIGH (ref 11.4–15.2)

## 2021-05-13 LAB — LACTIC ACID, PLASMA: Lactic Acid, Venous: 0.9 mmol/L (ref 0.5–1.9)

## 2021-05-13 LAB — ETHANOL: Alcohol, Ethyl (B): <10 mg/dL (ref <10)

## 2021-05-13 MED ORDER — ASPIRIN EC 81 MG PO TBEC
81.0000 mg | DELAYED_RELEASE_TABLET | Freq: Every day | ORAL | Status: DC
  Administered 2021-05-14: 81 mg via ORAL
  Filled 2021-05-13: qty 1

## 2021-05-13 MED ORDER — ZOLPIDEM TARTRATE 5 MG PO TABS: 5.0000 mg | ORAL_TABLET | Freq: Every evening | ORAL | Status: DC | PRN

## 2021-05-13 MED ORDER — ACETAMINOPHEN 500 MG PO TABS
1000.0000 mg | ORAL_TABLET | Freq: Once | ORAL | Status: AC
  Administered 2021-05-13: 1000 mg via ORAL
  Filled 2021-05-13: qty 2

## 2021-05-13 MED ORDER — SODIUM CHLORIDE 0.9 % IV SOLN: 250.0000 mL | INTRAVENOUS | Status: DC | PRN

## 2021-05-13 MED ORDER — NIFEDIPINE ER OSMOTIC RELEASE 30 MG PO TB24
30.0000 mg | ORAL_TABLET | Freq: Every day | ORAL | Status: DC
  Administered 2021-05-14: 30 mg via ORAL
  Filled 2021-05-13: qty 1

## 2021-05-13 MED ORDER — ONDANSETRON 4 MG PO TBDP
4.0000 mg | ORAL_TABLET | Freq: Four times a day (QID) | ORAL | Status: DC | PRN
Start: 2021-05-13 — End: 2021-05-15

## 2021-05-13 MED ORDER — CALCIUM CARBONATE ANTACID 500 MG PO CHEW: 2.0000 | CHEWABLE_TABLET | ORAL | Status: DC | PRN

## 2021-05-13 MED ORDER — ACETAMINOPHEN 325 MG PO TABS: 650.0000 mg | ORAL_TABLET | ORAL | Status: DC | PRN

## 2021-05-13 MED ORDER — SODIUM CHLORIDE 0.9% FLUSH
3.0000 mL | Freq: Two times a day (BID) | INTRAVENOUS | Status: DC
  Administered 2021-05-13 – 2021-05-14 (×2): 3 mL via INTRAVENOUS

## 2021-05-13 MED ORDER — PRENATAL MULTIVITAMIN CH
1.0000 | ORAL_TABLET | Freq: Every day | ORAL | Status: DC
  Administered 2021-05-14: 1 via ORAL
  Filled 2021-05-13: qty 1

## 2021-05-13 MED ORDER — DOCUSATE SODIUM 100 MG PO CAPS
100.0000 mg | ORAL_CAPSULE | Freq: Every day | ORAL | Status: DC
  Administered 2021-05-14: 100 mg via ORAL
  Filled 2021-05-13: qty 1

## 2021-05-13 MED ORDER — SODIUM CHLORIDE 0.9% FLUSH: 3.0000 mL | INTRAVENOUS | Status: DC | PRN
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
## 2021-05-13 NOTE — ED Notes (Signed)
Pt cleared to go to OB by Dr. Roslynn Amble. Pt transported by this RN and OB RR RN at this time.
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
## 2021-05-13 NOTE — Progress Notes (Signed)
°   05/13/21 2010  Clinical Encounter Type  Visited With Patient not available  Visit Type Trauma  Referral From Nurse  Consult/Referral To Chaplain   Chaplain Tery Sanfilippo responded to page. The patient is being attended to by the medical team. There is no support person present. Chaplain remains available for follow up support as needed. This note was prepared by Deneen Harts, M.Div..  For questions please contact by phone [phone].

## 2021-05-13 NOTE — ED Notes (Signed)
Ambulatory to bathroom at this time. C/O leg pain, recently given tylenol.

## 2021-05-14 ENCOUNTER — Observation Stay (HOSPITAL_BASED_OUTPATIENT_CLINIC_OR_DEPARTMENT_OTHER): Payer: Medicaid Other

## 2021-05-14 DIAGNOSIS — O9A213 Injury, poisoning and certain other consequences of external causes complicating pregnancy, third trimester: Secondary | ICD-10-CM | POA: Diagnosis not present

## 2021-05-14 DIAGNOSIS — I10 Essential (primary) hypertension: Secondary | ICD-10-CM

## 2021-05-14 DIAGNOSIS — Z3A30 30 weeks gestation of pregnancy: Secondary | ICD-10-CM

## 2021-05-14 DIAGNOSIS — M545 Low back pain, unspecified: Secondary | ICD-10-CM | POA: Diagnosis not present

## 2021-05-14 LAB — TYPE AND SCREEN
ABO/RH(D): A POS
Antibody Screen: NEGATIVE

## 2021-05-14 LAB — URINALYSIS, ROUTINE W REFLEX MICROSCOPIC
Bilirubin Urine: NEGATIVE
Glucose, UA: NEGATIVE mg/dL
Hgb urine dipstick: NEGATIVE
Ketones, ur: NEGATIVE mg/dL
Leukocytes,Ua: NEGATIVE
Nitrite: NEGATIVE
Protein, ur: NEGATIVE mg/dL
Specific Gravity, Urine: 1.021 (ref 1.005–1.030)
pH: 7 (ref 5.0–8.0)

## 2021-05-14 LAB — KLEIHAUER-BETKE STAIN
Fetal Cells %: 0 %
Quantitation Fetal Hemoglobin: 0 mL
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
## 2021-05-14 NOTE — Plan of Care (Signed)
?  Problem: Education: ?Goal: Knowledge of General Education information will improve ?Description: Including pain rating scale, medication(s)/side effects and non-pharmacologic comfort measures ?Outcome: Progressing ?  ?Problem: Health Behavior/Discharge Planning: ?Goal: Ability to manage health-related needs will improve ?Outcome: Progressing ?  ?Problem: Clinical Measurements: ?Goal: Ability to maintain clinical measurements within normal limits will improve ?Outcome: Progressing ?Goal: Will remain free from infection ?Outcome: Progressing ?Goal: Diagnostic test results will improve ?Outcome: Progressing ?Goal: Respiratory complications will improve ?Outcome: Progressing ?Goal: Cardiovascular complication will be avoided ?Outcome: Progressing ?  ?Problem: Activity: ?Goal: Risk for activity intolerance will decrease ?Outcome: Progressing ?  ?Problem: Nutrition: ?Goal: Adequate nutrition will be maintained ?Outcome: Progressing ?  ?Problem: Coping: ?Goal: Level of anxiety will decrease ?Outcome: Progressing ?  ?Problem: Elimination: ?Goal: Will not experience complications related to bowel motility ?Outcome: Progressing ?Goal: Will not experience complications related to urinary retention ?Outcome: Progressing ?  ?Problem: Pain Managment: ?Goal: General experience of comfort will improve ?Outcome: Progressing ?  ?Problem: Safety: ?Goal: Ability to remain free from injury will improve ?Outcome: Progressing ?  ?Problem: Skin Integrity: ?Goal: Risk for impaired skin integrity will decrease ?Outcome: Progressing ?  ?Problem: Education: ?Goal: Knowledge of disease or condition will improve ?Outcome: Progressing ?Goal: Knowledge of the prescribed therapeutic regimen will improve ?Outcome: Progressing ?Goal: Individualized Educational Video(s) ?Outcome: Progressing ?  ?Problem: Clinical Measurements: ?Goal: Complications related to the disease process, condition or treatment will be avoided or minimized ?Outcome:  Progressing ?  ?

## 2021-05-14 NOTE — Progress Notes (Signed)
Patient discharged home.  Instructions reviewed, patient verbalized understanding.  Ambulated to door with [name], NT.Patient stated home by [facility].

## 2021-05-14 NOTE — Plan of Care (Signed)
Problem: Education: ?Goal: Knowledge of General Education information will improve ?Description: Including pain rating scale, medication(s)/side effects and non-pharmacologic comfort measures ?Outcome: Completed/Met ?  ?Problem: Health Behavior/Discharge Planning: ?Goal: Ability to manage health-related needs will improve ?Outcome: Completed/Met ?  ?Problem: Clinical Measurements: ?Goal: Ability to maintain clinical measurements within normal limits will improve ?Outcome: Completed/Met ?Goal: Will remain free from infection ?Outcome: Completed/Met ?Goal: Diagnostic test results will improve ?Outcome: Completed/Met ?Goal: Respiratory complications will improve ?Outcome: Completed/Met ?Goal: Cardiovascular complication will be avoided ?Outcome: Completed/Met ?  ?Problem: Activity: ?Goal: Risk for activity intolerance will decrease ?Outcome: Completed/Met ?  ?Problem: Nutrition: ?Goal: Adequate nutrition will be maintained ?Outcome: Completed/Met ?  ?Problem: Coping: ?Goal: Level of anxiety will decrease ?Outcome: Completed/Met ?  ?Problem: Elimination: ?Goal: Will not experience complications related to bowel motility ?Outcome: Completed/Met ?Goal: Will not experience complications related to urinary retention ?Outcome: Completed/Met ?  ?Problem: Pain Managment: ?Goal: General experience of comfort will improve ?Outcome: Completed/Met ?  ?Problem: Safety: ?Goal: Ability to remain free from injury will improve ?Outcome: Completed/Met ?  ?Problem: Skin Integrity: ?Goal: Risk for impaired skin integrity will decrease ?Outcome: Completed/Met ?  ?Problem: Education: ?Goal: Knowledge of disease or condition will improve ?Outcome: Completed/Met ?Goal: Knowledge of the prescribed therapeutic regimen will improve ?Outcome: Completed/Met ?Goal: Individualized Educational Video(s) ?Outcome: Completed/Met ?  ?Problem: Clinical Measurements: ?Goal: Complications related to the disease process, condition or treatment will be  avoided or minimized ?Outcome: Completed/Met ?  ?

## 2021-05-14 NOTE — Progress Notes (Signed)
Reviewed FHR monitoring prior to discharge. RN noted baseline of 135-140, with moderate variability, 15x15 accelerations, and no present contractions. FHR strip appropriate for discharge. Raelyn Ensign, RN

## 2021-05-20 ENCOUNTER — Ambulatory Visit: Payer: Medicaid Other | Attending: Obstetrics & Gynecology

## 2021-05-20 ENCOUNTER — Other Ambulatory Visit: Payer: Self-pay

## 2021-05-20 ENCOUNTER — Other Ambulatory Visit: Payer: Self-pay | Admitting: *Deleted

## 2021-05-20 ENCOUNTER — Ambulatory Visit: Payer: Medicaid Other | Admitting: *Deleted

## 2021-05-20 VITALS — BP 131/78 | HR 93

## 2021-05-20 DIAGNOSIS — O368330 Maternal care for abnormalities of the fetal heart rate or rhythm, third trimester, not applicable or unspecified: Secondary | ICD-10-CM | POA: Diagnosis not present

## 2021-05-20 DIAGNOSIS — O10913 Unspecified pre-existing hypertension complicating pregnancy, third trimester: Secondary | ICD-10-CM

## 2021-05-20 DIAGNOSIS — Z148 Genetic carrier of other disease: Secondary | ICD-10-CM | POA: Diagnosis not present

## 2021-05-20 DIAGNOSIS — Z3A31 31 weeks gestation of pregnancy: Secondary | ICD-10-CM | POA: Insufficient documentation

## 2021-05-20 DIAGNOSIS — O368390 Maternal care for abnormalities of the fetal heart rate or rhythm, unspecified trimester, not applicable or unspecified: Secondary | ICD-10-CM

## 2021-05-20 DIAGNOSIS — Z3480 Encounter for supervision of other normal pregnancy, unspecified trimester: Secondary | ICD-10-CM | POA: Insufficient documentation

## 2021-05-20 DIAGNOSIS — O10013 Pre-existing essential hypertension complicating pregnancy, third trimester: Secondary | ICD-10-CM

## 2021-05-20 DIAGNOSIS — O10919 Unspecified pre-existing hypertension complicating pregnancy, unspecified trimester: Secondary | ICD-10-CM

## 2021-05-20 DIAGNOSIS — D563 Thalassemia minor: Secondary | ICD-10-CM

## 2021-05-21 ENCOUNTER — Encounter: Payer: Medicaid Other | Admitting: Obstetrics & Gynecology

## 2021-05-27 ENCOUNTER — Inpatient Hospital Stay (HOSPITAL_COMMUNITY): Admit: 2021-05-27 | Payer: No Typology Code available for payment source

## 2021-05-27 ENCOUNTER — Ambulatory Visit: Payer: Medicaid Other | Attending: Obstetrics & Gynecology

## 2021-05-27 ENCOUNTER — Ambulatory Visit: Payer: Medicaid Other

## 2021-06-03 ENCOUNTER — Ambulatory Visit: Payer: Medicaid Other

## 2021-06-03 ENCOUNTER — Ambulatory Visit: Payer: Medicaid Other | Attending: Obstetrics & Gynecology

## 2021-06-09 ENCOUNTER — Encounter: Payer: Medicaid Other | Admitting: Obstetrics

## 2021-06-10 ENCOUNTER — Ambulatory Visit: Payer: Medicaid Other

## 2021-06-10 ENCOUNTER — Ambulatory Visit: Payer: Medicaid Other | Attending: Obstetrics and Gynecology

## 2021-06-16 ENCOUNTER — Encounter: Payer: Medicaid Other | Admitting: Obstetrics

## 2021-06-17 ENCOUNTER — Encounter: Payer: Self-pay | Admitting: *Deleted

## 2021-06-17 ENCOUNTER — Ambulatory Visit (HOSPITAL_BASED_OUTPATIENT_CLINIC_OR_DEPARTMENT_OTHER): Payer: Medicaid Other | Admitting: Obstetrics and Gynecology

## 2021-06-17 ENCOUNTER — Ambulatory Visit: Payer: Medicaid Other | Attending: Obstetrics and Gynecology

## 2021-06-17 ENCOUNTER — Other Ambulatory Visit: Payer: Self-pay | Admitting: Obstetrics and Gynecology

## 2021-06-17 ENCOUNTER — Inpatient Hospital Stay (HOSPITAL_COMMUNITY)
Admission: AD | Admit: 2021-06-17 | Discharge: 2021-06-17 | Disposition: A | Discharge status: Home / Self Care | Payer: Medicaid Other | Attending: Obstetrics & Gynecology | Admitting: Obstetrics & Gynecology

## 2021-06-17 ENCOUNTER — Other Ambulatory Visit: Payer: Self-pay

## 2021-06-17 ENCOUNTER — Encounter: Payer: Medicaid Other | Admitting: Obstetrics

## 2021-06-17 ENCOUNTER — Ambulatory Visit: Payer: Medicaid Other | Admitting: *Deleted

## 2021-06-17 ENCOUNTER — Encounter (HOSPITAL_COMMUNITY): Payer: Self-pay | Admitting: Obstetrics & Gynecology

## 2021-06-17 VITALS — BP 167/88

## 2021-06-17 VITALS — BP 153/89 | HR 75

## 2021-06-17 DIAGNOSIS — O10013 Pre-existing essential hypertension complicating pregnancy, third trimester: Secondary | ICD-10-CM

## 2021-06-17 DIAGNOSIS — O10919 Unspecified pre-existing hypertension complicating pregnancy, unspecified trimester: Secondary | ICD-10-CM

## 2021-06-17 DIAGNOSIS — O0943 Supervision of pregnancy with grand multiparity, third trimester: Secondary | ICD-10-CM | POA: Diagnosis not present

## 2021-06-17 DIAGNOSIS — Z3A35 35 weeks gestation of pregnancy: Secondary | ICD-10-CM

## 2021-06-17 DIAGNOSIS — O10913 Unspecified pre-existing hypertension complicating pregnancy, third trimester: Secondary | ICD-10-CM

## 2021-06-17 DIAGNOSIS — R519 Headache, unspecified: Secondary | ICD-10-CM | POA: Diagnosis not present

## 2021-06-17 DIAGNOSIS — Z7982 Long term (current) use of aspirin: Secondary | ICD-10-CM | POA: Insufficient documentation

## 2021-06-17 DIAGNOSIS — O365930 Maternal care for other known or suspected poor fetal growth, third trimester, not applicable or unspecified: Secondary | ICD-10-CM

## 2021-06-17 DIAGNOSIS — Z3689 Encounter for other specified antenatal screening: Secondary | ICD-10-CM | POA: Insufficient documentation

## 2021-06-17 DIAGNOSIS — O285 Abnormal chromosomal and genetic finding on antenatal screening of mother: Secondary | ICD-10-CM | POA: Diagnosis not present

## 2021-06-17 DIAGNOSIS — Z8249 Family history of ischemic heart disease and other diseases of the circulatory system: Secondary | ICD-10-CM | POA: Insufficient documentation

## 2021-06-17 DIAGNOSIS — D563 Thalassemia minor: Secondary | ICD-10-CM

## 2021-06-17 DIAGNOSIS — Z79899 Other long term (current) drug therapy: Secondary | ICD-10-CM | POA: Insufficient documentation

## 2021-06-17 DIAGNOSIS — Z148 Genetic carrier of other disease: Secondary | ICD-10-CM | POA: Insufficient documentation

## 2021-06-17 DIAGNOSIS — O26893 Other specified pregnancy related conditions, third trimester: Secondary | ICD-10-CM | POA: Insufficient documentation

## 2021-06-17 DIAGNOSIS — Z3480 Encounter for supervision of other normal pregnancy, unspecified trimester: Secondary | ICD-10-CM

## 2021-06-17 DIAGNOSIS — O479 False labor, unspecified: Secondary | ICD-10-CM

## 2021-06-17 LAB — PROTEIN / CREATININE RATIO, URINE
Creatinine, Urine: 225.2 mg/dL
Protein Creatinine Ratio: 0.2 mg/mg{Cre} — ABNORMAL HIGH (ref 0.00–0.15)
Total Protein, Urine: 46 mg/dL

## 2021-06-17 LAB — FETAL NONSTRESS TEST

## 2021-06-17 LAB — COMPREHENSIVE METABOLIC PANEL
ALT: 12 U/L (ref 0–44)
AST: 18 U/L (ref 15–41)
Albumin: 2.8 g/dL — ABNORMAL LOW (ref 3.5–5.0)
Alkaline Phosphatase: 121 U/L (ref 38–126)
Anion gap: 7 (ref 5–15)
BUN: 9 mg/dL (ref 6–20)
CO2: 20 mmol/L — ABNORMAL LOW (ref 22–32)
Calcium: 8.9 mg/dL (ref 8.9–10.3)
Chloride: 108 mmol/L (ref 98–111)
Creatinine, Ser: 0.59 mg/dL (ref 0.44–1.00)
GFR, Estimated: >60 mL/min (ref >60)
Glucose, Bld: 82 mg/dL (ref 70–99)
Potassium: 3.7 mmol/L (ref 3.5–5.1)
Sodium: 135 mmol/L (ref 135–145)
Total Bilirubin: 0.6 mg/dL (ref 0.3–1.2)
Total Protein: 5.8 g/dL — ABNORMAL LOW (ref 6.5–8.1)

## 2021-06-17 LAB — CBC
HCT: 28 % — ABNORMAL LOW (ref 36.0–46.0)
Hemoglobin: 8.5 g/dL — ABNORMAL LOW (ref 12.0–15.0)
MCH: 25.3 pg — ABNORMAL LOW (ref 26.0–34.0)
MCHC: 30.4 g/dL (ref 30.0–36.0)
MCV: 83.3 fL (ref 80.0–100.0)
Platelets: 282 10*3/uL (ref 150–400)
RBC: 3.36 MIL/uL — ABNORMAL LOW (ref 3.87–5.11)
RDW: 12.9 % (ref 11.5–15.5)
WBC: 5.1 10*3/uL (ref 4.0–10.5)
nRBC: 0 % (ref 0.0–0.2)

## 2021-06-17 LAB — URINALYSIS, ROUTINE W REFLEX MICROSCOPIC
Bilirubin Urine: NEGATIVE
Glucose, UA: NEGATIVE mg/dL
Hgb urine dipstick: NEGATIVE
Ketones, ur: NEGATIVE mg/dL
Leukocytes,Ua: NEGATIVE
Nitrite: NEGATIVE
Protein, ur: 30 mg/dL — AB
Specific Gravity, Urine: 1.024 (ref 1.005–1.030)
pH: 5 (ref 5.0–8.0)

## 2021-06-17 MED ORDER — LACTATED RINGERS IV SOLN
INTRAVENOUS | Status: DC
Start: 2021-06-17 — End: 2021-06-18

## 2021-06-17 MED ORDER — HYDRALAZINE HCL 20 MG/ML IJ SOLN: 10.0000 mg | INTRAMUSCULAR | Status: DC | PRN

## 2021-06-17 MED ORDER — CYCLOBENZAPRINE HCL 5 MG PO TABS
10.0000 mg | ORAL_TABLET | Freq: Once | ORAL | Status: AC
  Administered 2021-06-17: 10 mg via ORAL
  Filled 2021-06-17: qty 2

## 2021-06-17 MED ORDER — CYCLOBENZAPRINE HCL 10 MG PO TABS
10.0000 mg | ORAL_TABLET | Freq: Three times a day (TID) | ORAL | 0 refills | Status: DC | PRN
Start: 2021-06-17 — End: 2022-11-16

## 2021-06-17 MED ORDER — NIFEDIPINE ER OSMOTIC RELEASE 30 MG PO TB24
30.0000 mg | ORAL_TABLET | Freq: Every day | ORAL | Status: DC
  Administered 2021-06-17: 30 mg via ORAL
  Filled 2021-06-17: qty 1

## 2021-06-17 MED ORDER — LABETALOL HCL 5 MG/ML IV SOLN
40.0000 mg | INTRAVENOUS | Status: DC | PRN
  Administered 2021-06-17: 40 mg via INTRAVENOUS
  Filled 2021-06-17: qty 8

## 2021-06-17 MED ORDER — LABETALOL HCL 5 MG/ML IV SOLN: 80.0000 mg | INTRAVENOUS | Status: DC | PRN

## 2021-06-17 MED ORDER — LABETALOL HCL 5 MG/ML IV SOLN
20.0000 mg | INTRAVENOUS | Status: DC | PRN
  Administered 2021-06-17: 20 mg via INTRAVENOUS
  Filled 2021-06-17: qty 4

## 2021-06-17 MED ORDER — ACETAMINOPHEN 500 MG PO TABS
1000.0000 mg | ORAL_TABLET | Freq: Once | ORAL | Status: AC
  Administered 2021-06-17: 1000 mg via ORAL
  Filled 2021-06-17: qty 2

## 2021-06-17 NOTE — MAU Provider Note (Signed)
?History  ?  ? ?CSN: [id number] ? ?Arrival date and time: 06/17/21 1901 ? ? Event Date/Time  ? First Provider Initiated Contact with Patient 06/17/21 2042   ?  ? ?Chief Complaint  ?Patient presents with  ? Headache  ? ?Sharon Johnston is a 31 y.o. [id number] at 42w3dwho receives care at [facility].  She presents today for Headache. She states her HA started this Am and was initially a 6/10.  She states she did not take any meds and it is now a 4/10.  She reports the HA is located on her right side and describes it as throbbing.  She was seen at MFM today and was found to have elevated blood pressure.  Patient reports she did not take her procardia today or yesterday due to running out of the prescription.  Patient states she took her ASA and PNC.  She denies SOB, RUQ pain, and edema.  She endorses fetal movement and states she notes some occasional contractions.  She denies vaginal concerns and issues with urination, diarrhea, or constipation.  ? ? ?OB History   ? ? Gravida  ?8  ? Para  ?4  ? Term  ?4  ? Preterm  ?0  ? AB  ?3  ? Living  ?4  ?  ? ? SAB  ?2  ? IAB  ?1  ? Ectopic  ?0  ? Multiple  ?0  ? Live Births  ?4  ?   ?  ?  ? ? ?Past Medical History:  ?Diagnosis Date  ? Chlamydia   ? Depression   ? hospitalized for 2 weeks after suicide att was post partum  ? Hx of gonorrhea   ? Hypertension   ? gestational HTN  ? Kidney infection   ? ? ?Past Surgical History:  ?Procedure Laterality Date  ? NO PAST SURGERIES    ? ? ?Family History  ?Problem Relation Age of Onset  ? Hypertension Mother   ? Hypertension Maternal Grandmother   ? Healthy Father   ? Hypertension Maternal Aunt   ? Depression Cousin   ? ? ?Social History  ? ?Tobacco Use  ? Smoking status: Some Days  ?  Types: Cigars, Cigarettes  ? Smokeless tobacco: Never  ? Tobacco comments:  ?  two black and milds per day  ?Vaping Use  ? Vaping Use: Never used  ?Substance Use Topics  ? Alcohol use: No  ? Drug use: No  ? ? ?Allergies: No Known Allergies ? ?Medications Prior  to Admission  ?Medication Sig Dispense Refill Last Dose  ? aspirin EC 81 MG tablet Take 1 tablet (81 mg total) by mouth daily. Swallow whole. 100 tablet 3 06/17/2021  ? Prenatal Vit-Fe Fumarate-FA (PRENATAL VITAMIN) 27-0.8 MG TABS Take 1 tablet by mouth daily. 30 tablet 13 06/17/2021  ? Blood Pressure Monitoring (BLOOD PRESSURE KIT) DEVI 1 kit by Does not apply route as needed. 1 each 0   ? Misc. Devices (GOJJI WEIGHT SCALE) MISC 1 Device by Does not apply route as needed. (Patient not taking: Reported on 04/22/2021) 1 each 0   ? NIFEdipine (PROCARDIA-XL/NIFEDICAL-XL) 30 MG 24 hr tablet Take 1 tablet (30 mg total) by mouth daily. (Patient not taking: Reported on 05/20/2021) 30 tablet 5   ? ? ?Review of Systems  ?Genitourinary:  Negative for difficulty urinating, dysuria, vaginal bleeding and vaginal discharge.  ?Neurological:  Positive for headaches. Negative for dizziness and light-headedness.  ?Physical Exam  ? ?Blood pressure (!) 157/77, pulse  68, temperature 98.2 ?F (36.8 ?C), temperature source Oral, resp. rate 20, height 5' 3" (1.6 m), weight 74 kg, last menstrual period 10/16/2020, SpO2 99 %, unknown if currently breastfeeding. ?Vitals:  ? 06/17/21 2101 06/17/21 2116 06/17/21 2131 06/17/21 2146  ?BP: (!) 163/96 (!) 152/81 (!) 159/91 (!) 158/88  ?Pulse: 90 75 72 70  ?Resp:      ?Temp:      ?TempSrc:      ?SpO2:      ?Weight:      ?Height:      ? ? ?Physical Exam ?Constitutional:   ?   Appearance: Normal appearance. She is well-developed.  ?HENT:  ?   Head: Normocephalic and atraumatic.  ?Eyes:  ?   Conjunctiva/sclera: Conjunctivae normal.  ?Cardiovascular:  ?   Rate and Rhythm: Normal rate.  ?   Heart sounds: Normal heart sounds.  ?Pulmonary:  ?   Effort: Pulmonary effort is normal.  ?Abdominal:  ?   General: Bowel sounds are normal.  ?Musculoskeletal:     ?   General: Normal range of motion.  ?Skin: ?   General: Skin is warm and dry.  ?Neurological:  ?   Mental Status: She is alert and oriented to person, place,  and time.  ?Psychiatric:     ?   Mood and Affect: Mood normal.     ?   Behavior: Behavior normal.  ? ? ?Fetal Assessment ?125 bpm, Mod Var, -Decels, +Accels ?Toco: Q2-41mn ? ?MAU Course  ? ?Results for orders placed or performed during the hospital encounter of 06/17/21 (from the past 24 hour(s))  ?Urinalysis, Routine w reflex microscopic Urine, Clean Catch     Status: Abnormal  ? Collection Time: 06/17/21  7:30 PM  ?Result Value Ref Range  ? Color, Urine YELLOW YELLOW  ? APPearance HAZY (A) CLEAR  ? Specific Gravity, Urine 1.024 1.005 - 1.030  ? pH 5.0 5.0 - 8.0  ? Glucose, UA NEGATIVE NEGATIVE mg/dL  ? Hgb urine dipstick NEGATIVE NEGATIVE  ? Bilirubin Urine NEGATIVE NEGATIVE  ? Ketones, ur NEGATIVE NEGATIVE mg/dL  ? Protein, ur 30 (A) NEGATIVE mg/dL  ? Nitrite NEGATIVE NEGATIVE  ? Leukocytes,Ua NEGATIVE NEGATIVE  ? RBC / HPF 0-5 0 - 5 RBC/hpf  ? WBC, UA 0-5 0 - 5 WBC/hpf  ? Bacteria, UA RARE (A) NONE SEEN  ? Squamous Epithelial / LPF 11-20 0 - 5  ? Mucus PRESENT   ?Protein / creatinine ratio, urine     Status: Abnormal  ? Collection Time: 06/17/21  7:30 PM  ?Result Value Ref Range  ? Creatinine, Urine 225.20 mg/dL  ? Total Protein, Urine 46 mg/dL  ? Protein Creatinine Ratio 0.20 (H) 0.00 - 0.15 mg/mg[Cre]  ?CBC     Status: Abnormal  ? Collection Time: 06/17/21  8:29 PM  ?Result Value Ref Range  ? WBC 5.1 4.0 - 10.5 K/uL  ? RBC 3.36 (L) 3.87 - 5.11 MIL/uL  ? Hemoglobin 8.5 (L) 12.0 - 15.0 g/dL  ? HCT 28.0 (L) 36.0 - 46.0 %  ? MCV 83.3 80.0 - 100.0 fL  ? MCH 25.3 (L) 26.0 - 34.0 pg  ? MCHC 30.4 30.0 - 36.0 g/dL  ? RDW 12.9 11.5 - 15.5 %  ? Platelets 282 150 - 400 K/uL  ? nRBC 0.0 0.0 - 0.2 %  ?Comprehensive metabolic panel     Status: Abnormal  ? Collection Time: 06/17/21  8:29 PM  ?Result Value Ref Range  ? Sodium 135 135 -  145 mmol/L  ? Potassium 3.7 3.5 - 5.1 mmol/L  ? Chloride 108 98 - 111 mmol/L  ? CO2 20 (L) 22 - 32 mmol/L  ? Glucose, Bld 82 70 - 99 mg/dL  ? BUN 9 6 - 20 mg/dL  ? Creatinine, Ser 0.59 0.44 -  1.00 mg/dL  ? Calcium 8.9 8.9 - 10.3 mg/dL  ? Total Protein 5.8 (L) 6.5 - 8.1 g/dL  ? Albumin 2.8 (L) 3.5 - 5.0 g/dL  ? AST 18 15 - 41 U/L  ? ALT 12 0 - 44 U/L  ? Alkaline Phosphatase 121 38 - 126 U/L  ? Total Bilirubin 0.6 0.3 - 1.2 mg/dL  ? GFR, Estimated >60 >60 mL/min  ? Anion gap 7 5 - 15  ? ?[place] MFM FETAL BPP W/NONSTRESS ? ?Result Date: 06/17/2021 ?----------------------------------------------------------------------  OBSTETRICS REPORT                       (Signed Final 06/17/2021 11:58 am) ---------------------------------------------------------------------- Patient Info  ID #:       [id number]                          D.O.B.:  December 20, 1990 (30 yrs)  Name:       Ambrose Pancoast Caven                  Visit Date: 06/17/2021 09:50 am ---------------------------------------------------------------------- Performed By  Attending:        Tama High MD        Secondary Phy.:   [place]  Performed By:     Eveline Keto         [facility]         [facility]                                                             [place] [facility]                                                             [facility]  Referred By:      [facility]                    Care ---------------------------------------------------------------------- Orders  #  Description                           Code        Ordered By  1  [place] MFM FETAL BPP                      65035.4     [facility]     W/NONSTRESS  2  [place] MFM OB FOLLOW UP                   [id number]    RAVI SHANKAR  3  [place] MFM UA CORD DOPPLER                76820.02    [facility] ----------------------------------------------------------------------  #  Order #                     Accession #                Episode #  1  [id number]                   [id number]                 [id number]  2  [id number]                   [id number]                 [id number]  3  [id number]                   [id number]                  [id number] ---------------------------------------------------------------------- Indications  [redacted] weeks gestation of pregnancy                Z3A.35  Pre-existing essential hypertension            O10.013  co

## 2021-06-17 NOTE — Progress Notes (Signed)
Maternal-Fetal Medicine  ? ?Name: Sharon Johnston ?DOB: 09-12-1990 ?MRN: [id number] ?Referring Provider: Milas Hock, MD ? ?I had the pleasure of seeing [name] today at the [facility]. She return for fetal growth assessment.  Patient has not had prenatal visit for a month.  She was advised to take Procardia.  Patient has not filled her prescription yet. ?Today her blood pressures were 163/93, 153/89 and 167/88 mmHg.  She does not have symptoms of severe headache or visual disturbances or right upper quadrant pain or vaginal bleeding. ? ?Obstetrical history is significant for 4 term vaginal deliveries.  Patient does not have gestational diabetes. ? ?Ultrasound ?On today's ultrasound, amniotic fluid is normal and good fetal activity seen.  The estimated fetal weight is at the 6th percentile.  Femur length measurement is at the 1st percentile.  Abdominal circumference measurement is at the 13th percentile.  Umbilical artery Doppler showed normal forward diastolic flow.  NST is reactive.  BPP 10/10. ? ?Fetal growth restriction and possible superimposed preeclampsia ?I explained the finding of fetal growth restriction and the most common causes placental insufficiency.  Chronic hypertension/superimposed preeclampsia can lead to severe placental insufficiency and growth restriction. ?I discussed the possible diagnosis of superimposed preeclampsia.  If severe range blood pressures persist, delivery will be indicated now.  I counseled her on the possible complications of severe hypertension including eclampsia, stroke, placental abruption and endorgan damage. ?I strongly recommended that she be evaluated at the MAU with series of blood pressures and labs. ?I explained the importance of taking antihypertensives to prevent maternal complications. ?Patient will be going over to the MAU.  If severe range blood pressures persist, I recommend delivery. ? ?If the patient is discharged from MAU, she should  return for antenatal testing next week.   ? ?Recommendations ?-MAU evaluation. ?-If severe range blood pressures persist, patient should be delivered. ?-Discussed with MAU team. ? ?Thank you for consultation.  If you have any questions or concerns, please contact me the [facility].  Consultation including face-to-face (more than 50%) counseling 30 minutes. ? ?

## 2021-06-17 NOTE — Discharge Instructions (Signed)
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
## 2021-06-17 NOTE — Procedures (Signed)
Sharon Johnston ?1991/01/31 ?[redacted]w[redacted]d ? ?Fetus A Non-Stress Test Interpretation for 06/17/21 ? ?Indication: Chronic Hypertenstion ? ?Fetal Heart Rate A ?Mode: External ?Baseline Rate (A): 140 bpm ?Variability: Moderate ?Accelerations: 15 x 15 ?Decelerations: None ?Multiple birth?: No ? ?Uterine Activity ?Mode: Toco ?Contraction Frequency (min): 10 with UI ?Contraction Duration (sec): 70 ?Contraction Quality: Mild (not felt by pt) ?Resting Tone Palpated: Relaxed ?Resting Time: Adequate ? ?Interpretation (Fetal Testing) ?Nonstress Test Interpretation: Reactive ?Overall Impression: Reassuring for gestational age ?Comments: tracing reviewed by Dr. Judeth Cornfield ? ? ?

## 2021-06-17 NOTE — MAU Note (Addendum)
PT SAID SHE WENT TO MFM TODAY FOR AN U/S - WHILE THERE HER BP - HAS HAD PROBLEM BEFORE - TAKES MEDS - TOOK THEM TODAY  ?H/A STARTED BEFORE APPOINTMENT -NO MEDS - 6 ?NO VISUAL  CHANGES NOW  ?NO EPIGASTRIC PAIN ?WAS TOLD TO COME HERE  ?WHILE ON MONITOR - HAD UC'S - 4  ?NO VE  ?NO GBS COLLECTED YET ? ?

## 2021-06-18 ENCOUNTER — Encounter: Payer: Self-pay | Admitting: *Deleted

## 2021-06-18 NOTE — Progress Notes (Signed)
OBIX down on 06/17/21.  Paper NST scanned to media tab.  ?

## 2021-06-21 ENCOUNTER — Inpatient Hospital Stay (HOSPITAL_COMMUNITY)
Admission: AD | Admit: 2021-06-21 | Discharge: 2021-06-24 | DRG: 798 | Disposition: A | Discharge status: Home / Self Care | Payer: Medicaid Other | Attending: Obstetrics and Gynecology | Admitting: Obstetrics and Gynecology

## 2021-06-21 ENCOUNTER — Ambulatory Visit: Payer: Medicaid Other | Admitting: *Deleted

## 2021-06-21 ENCOUNTER — Ambulatory Visit (HOSPITAL_BASED_OUTPATIENT_CLINIC_OR_DEPARTMENT_OTHER): Payer: Medicaid Other | Admitting: *Deleted

## 2021-06-21 ENCOUNTER — Encounter: Payer: Self-pay | Admitting: *Deleted

## 2021-06-21 ENCOUNTER — Encounter (HOSPITAL_COMMUNITY): Payer: Self-pay | Admitting: Obstetrics and Gynecology

## 2021-06-21 VITALS — BP 167/95 | HR 69

## 2021-06-21 DIAGNOSIS — Z3A36 36 weeks gestation of pregnancy: Secondary | ICD-10-CM | POA: Diagnosis not present

## 2021-06-21 DIAGNOSIS — O1002 Pre-existing essential hypertension complicating childbirth: Secondary | ICD-10-CM | POA: Diagnosis present

## 2021-06-21 DIAGNOSIS — Z7982 Long term (current) use of aspirin: Secondary | ICD-10-CM

## 2021-06-21 DIAGNOSIS — O99334 Smoking (tobacco) complicating childbirth: Secondary | ICD-10-CM | POA: Diagnosis present

## 2021-06-21 DIAGNOSIS — O1413 Severe pre-eclampsia, third trimester: Principal | ICD-10-CM

## 2021-06-21 DIAGNOSIS — O10919 Unspecified pre-existing hypertension complicating pregnancy, unspecified trimester: Secondary | ICD-10-CM

## 2021-06-21 DIAGNOSIS — Z302 Encounter for sterilization: Secondary | ICD-10-CM

## 2021-06-21 DIAGNOSIS — O365930 Maternal care for other known or suspected poor fetal growth, third trimester, not applicable or unspecified: Secondary | ICD-10-CM | POA: Insufficient documentation

## 2021-06-21 DIAGNOSIS — D563 Thalassemia minor: Secondary | ICD-10-CM | POA: Diagnosis present

## 2021-06-21 DIAGNOSIS — O114 Pre-existing hypertension with pre-eclampsia, complicating childbirth: Principal | ICD-10-CM | POA: Diagnosis present

## 2021-06-21 DIAGNOSIS — Z3480 Encounter for supervision of other normal pregnancy, unspecified trimester: Secondary | ICD-10-CM

## 2021-06-21 DIAGNOSIS — F32A Depression, unspecified: Secondary | ICD-10-CM | POA: Diagnosis present

## 2021-06-21 DIAGNOSIS — O1414 Severe pre-eclampsia complicating childbirth: Secondary | ICD-10-CM | POA: Diagnosis not present

## 2021-06-21 DIAGNOSIS — F17290 Nicotine dependence, other tobacco product, uncomplicated: Secondary | ICD-10-CM | POA: Diagnosis present

## 2021-06-21 DIAGNOSIS — O10913 Unspecified pre-existing hypertension complicating pregnancy, third trimester: Secondary | ICD-10-CM | POA: Insufficient documentation

## 2021-06-21 DIAGNOSIS — I10 Essential (primary) hypertension: Secondary | ICD-10-CM | POA: Diagnosis present

## 2021-06-21 DIAGNOSIS — O1410 Severe pre-eclampsia, unspecified trimester: Principal | ICD-10-CM | POA: Diagnosis present

## 2021-06-21 LAB — CBC
HCT: 28.3 % — ABNORMAL LOW (ref 36.0–46.0)
Hemoglobin: 8.9 g/dL — ABNORMAL LOW (ref 12.0–15.0)
MCH: 25.8 pg — ABNORMAL LOW (ref 26.0–34.0)
MCHC: 31.4 g/dL (ref 30.0–36.0)
MCV: 82 fL (ref 80.0–100.0)
Platelets: 280 10*3/uL (ref 150–400)
RBC: 3.45 MIL/uL — ABNORMAL LOW (ref 3.87–5.11)
RDW: 13.2 % (ref 11.5–15.5)
WBC: 4.7 10*3/uL (ref 4.0–10.5)
nRBC: 0 % (ref 0.0–0.2)

## 2021-06-21 LAB — COMPREHENSIVE METABOLIC PANEL
ALT: 15 U/L (ref 0–44)
AST: 22 U/L (ref 15–41)
Albumin: 2.7 g/dL — ABNORMAL LOW (ref 3.5–5.0)
Alkaline Phosphatase: 122 U/L (ref 38–126)
Anion gap: 7 (ref 5–15)
BUN: 10 mg/dL (ref 6–20)
CO2: 21 mmol/L — ABNORMAL LOW (ref 22–32)
Calcium: 8.2 mg/dL — ABNORMAL LOW (ref 8.9–10.3)
Chloride: 107 mmol/L (ref 98–111)
Creatinine, Ser: 0.67 mg/dL (ref 0.44–1.00)
GFR, Estimated: >60 mL/min (ref >60)
Glucose, Bld: 83 mg/dL (ref 70–99)
Potassium: 3.3 mmol/L — ABNORMAL LOW (ref 3.5–5.1)
Sodium: 135 mmol/L (ref 135–145)
Total Bilirubin: 0.4 mg/dL (ref 0.3–1.2)
Total Protein: 6.3 g/dL — ABNORMAL LOW (ref 6.5–8.1)

## 2021-06-21 LAB — TYPE AND SCREEN
ABO/RH(D): A POS
Antibody Screen: NEGATIVE

## 2021-06-21 LAB — PROTEIN / CREATININE RATIO, URINE
Creatinine, Urine: 239.14 mg/dL
Protein Creatinine Ratio: 0.27 mg/mg{Cre} — ABNORMAL HIGH (ref 0.00–0.15)
Total Protein, Urine: 65 mg/dL

## 2021-06-21 MED ORDER — MAGNESIUM SULFATE BOLUS VIA INFUSION
4.0000 g | Freq: Once | INTRAVENOUS | Status: AC
  Administered 2021-06-21: 4 g via INTRAVENOUS
  Filled 2021-06-21: qty 1000

## 2021-06-21 MED ORDER — ONDANSETRON HCL 4 MG/2ML IJ SOLN: 4.0000 mg | Freq: Four times a day (QID) | INTRAMUSCULAR | Status: DC | PRN

## 2021-06-21 MED ORDER — LACTATED RINGERS IV SOLN
INTRAVENOUS | Status: DC
Start: 2021-06-21 — End: 2021-06-22

## 2021-06-21 MED ORDER — SODIUM CHLORIDE 0.9 % IV SOLN
5.0000 10*6.[IU] | Freq: Once | INTRAVENOUS | Status: AC
  Administered 2021-06-21: 5 10*6.[IU] via INTRAVENOUS
  Filled 2021-06-21: qty 5

## 2021-06-21 MED ORDER — LABETALOL HCL 5 MG/ML IV SOLN
20.0000 mg | INTRAVENOUS | Status: DC | PRN
  Administered 2021-06-21: 20 mg via INTRAVENOUS
  Filled 2021-06-21: qty 4

## 2021-06-21 MED ORDER — MAGNESIUM SULFATE 40 GM/1000ML IV SOLN
2.0000 g/h | INTRAVENOUS | Status: AC
  Administered 2021-06-21 – 2021-06-23 (×3): 2 g/h via INTRAVENOUS
  Filled 2021-06-21 (×3): qty 1000

## 2021-06-21 MED ORDER — LIDOCAINE HCL (PF) 1 % IJ SOLN: 30.0000 mL | INTRAMUSCULAR | Status: DC | PRN

## 2021-06-21 MED ORDER — LABETALOL HCL 5 MG/ML IV SOLN
80.0000 mg | INTRAVENOUS | Status: DC | PRN
  Administered 2021-06-21: 80 mg via INTRAVENOUS
  Filled 2021-06-21: qty 16

## 2021-06-21 MED ORDER — OXYCODONE-ACETAMINOPHEN 5-325 MG PO TABS: 2.0000 | ORAL_TABLET | ORAL | Status: DC | PRN

## 2021-06-21 MED ORDER — PENICILLIN G POT IN DEXTROSE 60000 UNIT/ML IV SOLN
3.0000 10*6.[IU] | INTRAVENOUS | Status: DC
  Administered 2021-06-22 (×4): 3 10*6.[IU] via INTRAVENOUS
  Filled 2021-06-21 (×8): qty 50

## 2021-06-21 MED ORDER — ACETAMINOPHEN 325 MG PO TABS
650.0000 mg | ORAL_TABLET | ORAL | Status: DC | PRN
  Administered 2021-06-22 (×2): 650 mg via ORAL
  Filled 2021-06-21 (×2): qty 2

## 2021-06-21 MED ORDER — HYDRALAZINE HCL 20 MG/ML IJ SOLN: 10.0000 mg | INTRAMUSCULAR | Status: DC | PRN

## 2021-06-21 MED ORDER — OXYCODONE-ACETAMINOPHEN 5-325 MG PO TABS: 1.0000 | ORAL_TABLET | ORAL | Status: DC | PRN

## 2021-06-21 MED ORDER — LABETALOL HCL 5 MG/ML IV SOLN
40.0000 mg | INTRAVENOUS | Status: DC | PRN
  Administered 2021-06-21: 40 mg via INTRAVENOUS
  Filled 2021-06-21: qty 8

## 2021-06-21 MED ORDER — SOD CITRATE-CITRIC ACID 500-334 MG/5ML PO SOLN: 30.0000 mL | ORAL | Status: DC | PRN

## 2021-06-21 MED ORDER — OXYTOCIN-SODIUM CHLORIDE 30-0.9 UT/500ML-% IV SOLN: 2.5000 [IU]/h | INTRAVENOUS | Status: DC

## 2021-06-21 MED ORDER — LACTATED RINGERS IV SOLN: INTRAVENOUS | Status: DC

## 2021-06-21 MED ORDER — ZOLPIDEM TARTRATE 5 MG PO TABS
5.0000 mg | ORAL_TABLET | Freq: Every evening | ORAL | Status: DC | PRN
  Administered 2021-06-21: 5 mg via ORAL
  Filled 2021-06-21: qty 1

## 2021-06-21 MED ORDER — FENTANYL CITRATE (PF) 100 MCG/2ML IJ SOLN
100.0000 ug | INTRAMUSCULAR | Status: DC | PRN
  Administered 2021-06-21 – 2021-06-22 (×4): 100 ug via INTRAVENOUS
  Filled 2021-06-21 (×4): qty 2

## 2021-06-21 MED ORDER — LACTATED RINGERS IV SOLN: 500.0000 mL | INTRAVENOUS | Status: DC | PRN

## 2021-06-21 MED ORDER — OXYTOCIN BOLUS FROM INFUSION
333.0000 mL | Freq: Once | INTRAVENOUS | Status: AC
  Administered 2021-06-22: 333 mL via INTRAVENOUS

## 2021-06-21 NOTE — Progress Notes (Signed)
OBIX not functioning. Paper tracing only. ?

## 2021-06-21 NOTE — Procedures (Addendum)
Sharon Johnston ?February 13, 1991 ?[redacted]w[redacted]d ? ?Fetus A Non-Stress Test Interpretation for 06/21/21 ? ?Indication: IUGR and Chronic Hypertenstion ? ?Fetal Heart Rate A ?Mode: External ?Baseline Rate (A): 130 bpm ?Variability: Moderate ?Accelerations: 15 x 15 ?Decelerations: None ?Multiple birth?: No ? ?Uterine Activity ?Mode: Palpation, Toco ?Contraction Frequency (min): Occas. ?Contraction Quality: Mild ?Resting Tone Palpated: Relaxed ?Resting Time: Adequate ? ?Interpretation (Fetal Testing) ?Nonstress Test Interpretation: Reactive ?Comments: Dr. Judeth Cornfield reviewed tracing and BP's. Patient advised to go to MAU. Dr. Judeth Cornfield to give report to provider. ? ?MFM (Dr. Judeth Cornfield) ?I discussed the importance of evaluation at the MAU. Patient agreed to go the MAU. She does not have signs and symptoms of severe features of preeclampsia. ?Patient may be admitted for antihypertensive management. If severe hypertension requires increasing doses of antihypertensives, delivery should be considered. ?If patient has mild hypertension at the MAU and discharge is considered, I recommend delivery at 37 weeks' gestation. ? ?
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
## 2021-06-21 NOTE — MAU Note (Signed)
Sharon Johnston is a 31 y.o. at [redacted]w[redacted]d here in MAU reporting: sent over from dr.  BP is high and it won't go down.is on BP medication, took it this morning. Has a HA, denies visual changes or epigastric pain, little swelling in feet. Denies bleeding or ROM, reports +FM ? ?Onset of complaint: HA since this morning, took some Tylenol, some relief, then came back ?Pain score: 6 ?Vitals:  ? 06/21/21 1715  ?BP: (!) 163/94  ?Pulse: 73  ?Resp: 17  ?Temp: 99.1 ?F (37.3 ?C)  ?SpO2: 99%  ?   ?FHT:144 ?Lab orders placed from triage:   ?

## 2021-06-21 NOTE — MAU Provider Note (Addendum)
?History  ? Sharon Johnston is a 31 y.o. [id number] at 24w0dwith a history of chronic HTN who presents with an elevated blood pressure and headache. Patient reports 6/10 headache in bilateral temples starting at 6 am. She took tylenol around 10 am which helped a bit, but the headache still persists. She has not vomited today and has been eating and drinking okay. Patient reports some swelling in her lower extremities. Last week patient had routine OB appointment where approximately 1.5cm dilation was observed. ? ?CSN: [id number]? ?Arrival date and time: 06/21/21 1700 ? ? Event Date/Time  ? First Provider Initiated Contact with Patient 06/21/21 1743   ?  ? ?Chief Complaint  ?Patient presents with  ? Headache  ? Hypertension  ? ?Headache  ?Her past medical history is significant for hypertension.  ?Hypertension ?Associated symptoms include headaches.  ? ?OB History   ? ? Gravida  ?8  ? Para  ?4  ? Term  ?4  ? Preterm  ?0  ? AB  ?3  ? Living  ?4  ?  ? ? SAB  ?2  ? IAB  ?1  ? Ectopic  ?0  ? Multiple  ?0  ? Live Births  ?4  ?   ?  ?  ? ? ?Past Medical History:  ?Diagnosis Date  ? Chlamydia   ? Depression   ? hospitalized for 2 weeks after suicide att was post partum  ? Hx of gonorrhea   ? Hypertension   ? gestational HTN  ? Kidney infection   ? ? ?Past Surgical History:  ?Procedure Laterality Date  ? NO PAST SURGERIES    ? ? ?Family History  ?Problem Relation Age of Onset  ? Hypertension Mother   ? Hypertension Maternal Grandmother   ? Healthy Father   ? Hypertension Maternal Aunt   ? Depression Cousin   ? ? ?Social History  ? ?Tobacco Use  ? Smoking status: Some Days  ?  Types: Cigars  ? Smokeless tobacco: Never  ? Tobacco comments:  ?  two black and milds per day  ?Vaping Use  ? Vaping Use: Never used  ?Substance Use Topics  ? Alcohol use: No  ? Drug use: No  ? ? ?Allergies: No Known Allergies ? ?Medications Prior to Admission  ?Medication Sig Dispense Refill Last Dose  ? aspirin EC 81 MG tablet Take 1 tablet (81 mg total)  by mouth daily. Swallow whole. 100 tablet 3 06/21/2021 at 1000  ? NIFEdipine (PROCARDIA-XL/NIFEDICAL-XL) 30 MG 24 hr tablet Take 1 tablet (30 mg total) by mouth daily. 30 tablet 5 06/21/2021 at 1000  ? Prenatal Vit-Fe Fumarate-FA (PRENATAL VITAMIN) 27-0.8 MG TABS Take 1 tablet by mouth daily. 30 tablet 13 06/21/2021 at 1000  ? Blood Pressure Monitoring (BLOOD PRESSURE KIT) DEVI 1 kit by Does not apply route as needed. 1 each 0   ? cyclobenzaprine (FLEXERIL) 10 MG tablet Take 1 tablet (10 mg total) by mouth 3 (three) times daily as needed for muscle spasms. 10 tablet 0   ? Misc. Devices (GOJJI WEIGHT SCALE) MISC 1 Device by Does not apply route as needed. (Patient not taking: Reported on 04/22/2021) 1 each 0   ? ? ?Review of Systems  ?Neurological:  Positive for headaches.  ?Physical Exam  ? ?Blood pressure (!) 180/103, pulse 67, temperature 99.1 ?F (37.3 ?C), temperature source Oral, resp. rate 17, height 5' 3"  (1.6 m), weight 72.7 kg, last menstrual period 10/16/2020, SpO2 100 %, unknown if  currently breastfeeding. ? ?Patient Vitals for the past 24 hrs: ? BP Temp Temp src Pulse Resp SpO2 Height Weight  ?06/21/21 1916 (!) 144/71 -- -- 73 -- -- -- --  ?06/21/21 1901 (!) 147/78 -- -- 73 -- -- -- --  ?06/21/21 1900 (!) 147/78 -- -- -- -- 100 % -- --  ?06/21/21 1845 (!) 158/98 -- -- 83 -- 100 % -- --  ?06/21/21 1830 (!) 148/89 -- -- 75 -- 99 % -- --  ?06/21/21 1815 (!) 162/94 -- -- 76 -- 99 % -- --  ?06/21/21 1800 (!) 180/103 -- -- 67 -- -- -- --  ?06/21/21 1745 (!) 162/93 -- -- 82 -- 100 % -- --  ?06/21/21 1734 (!) 177/105 -- -- 83 -- -- -- --  ?06/21/21 1715 (!) 163/94 99.1 ?F (37.3 ?C) Oral 73 17 99 % 5' 3"  (1.6 m) 72.7 kg  ? ? ? ?Physical Exam ?Constitutional:   ?   Appearance: She is well-developed.  ?   Comments: Alert and conversational during exam  ?HENT:  ?   Head: Normocephalic and atraumatic.  ?Cardiovascular:  ?   Rate and Rhythm: Normal rate and regular rhythm.  ?Pulmonary:  ?   Effort: Pulmonary effort is normal.   ?   Breath sounds: Normal breath sounds.  ?Abdominal:  ?   General: Bowel sounds are normal. There is distension.  ?   Tenderness: There is no abdominal tenderness.  ?Musculoskeletal:  ?   Cervical back: Neck supple.  ?   Comments: Mild swelling in bilateral ankles.  ?Skin: ?   General: Skin is warm and dry.  ?Neurological:  ?   Mental Status: She is alert.  ? ? ?MAU Course  ?Procedures ? ?Results for orders placed or performed during the hospital encounter of 06/21/21 (from the past 24 hour(s))  ?Protein / creatinine ratio, urine     Status: Abnormal  ? Collection Time: 06/21/21  5:00 PM  ?Result Value Ref Range  ? Creatinine, Urine 239.14 mg/dL  ? Total Protein, Urine 65 mg/dL  ? Protein Creatinine Ratio 0.27 (H) 0.00 - 0.15 mg/mg[Cre]  ?Type and screen [place]     Status: None (Preliminary result)  ? Collection Time: 06/21/21  5:50 PM  ?Result Value Ref Range  ? ABO/RH(D) PENDING   ? Antibody Screen PENDING   ? Sample Expiration    ?  06/24/2021,2359 ?Performed at [facility], [place] [address], [place], [place] [address] ?  ?CBC     Status: Abnormal  ? Collection Time: 06/21/21  5:57 PM  ?Result Value Ref Range  ? WBC 4.7 4.0 - 10.5 K/uL  ? RBC 3.45 (L) 3.87 - 5.11 MIL/uL  ? Hemoglobin 8.9 (L) 12.0 - 15.0 g/dL  ? HCT 28.3 (L) 36.0 - 46.0 %  ? MCV 82.0 80.0 - 100.0 fL  ? MCH 25.8 (L) 26.0 - 34.0 pg  ? MCHC 31.4 30.0 - 36.0 g/dL  ? RDW 13.2 11.5 - 15.5 %  ? Platelets 280 150 - 400 K/uL  ? nRBC 0.0 0.0 - 0.2 %  ? ?MDM ?1800: ?Physical exam ?Labs: CBC, CMP, UA, PrCr ratio ?Antihypertensive protocol: Labetalol and Hydralazine ?Measure BP every 15 min ?mIVF LR ? ?Assessment and Plan  ?Sharon Johnston is a 31 y.o. [id number] at 26w0dwith a history of chronic HTN and gestational HTN who presents with an elevated blood pressure and headache. Her severe BP readings suggests preeclampsia superimposed on chronic hypertension  and require prompt antihypertensive administration and close monitoring.  Patient's CBC suggests she is anemic, however, her platelet counts and CBC are unremarkable at this time. ? ?Preeclampsia superimposed on chronic hypertension  ?- Physical exam performed ?- Labetalol and hydralazine ?- Evaluate CBC, CMP, UA, PrCr ratio ?- Measure BP q15 min ?- Admit for induction of labor ?  - Prophylactic GBS treatment ? ?Pirapat Rerkpattanapipat ?06/21/2021, 7:06 PM  ? ? ?Attestation of Supervision of Student:  I confirm that I have verified the information documented in the medical student?s note and that I have also personally reperformed the history, physical exam and all medical decision making activities.  I have verified that all services and findings are accurately documented in this student's note; and I agree with management and plan as outlined in the documentation. I have also made any necessary editorial changes. ? ?Patient sent to MAU from MFM due to severe range BPs. NST reactive there. Recommended delivery if needing medication to treat BPs. ? ?Patient's BP elevated on arrival. Needed labetalol 36m, then 463m then 8050mo control.  ?No changes in LFTs or platelets. ? ?Patient diagnosed with CHTN with superimposed preeclampsia. As past 36 weeks, will induce.  ?Start magnesium. ?Admit ?PCN for GBS ppx. ? ? ?JacTruett MainlandO ?[facility]/05/2021 7:25 PM ? ?

## 2021-06-22 ENCOUNTER — Encounter (HOSPITAL_COMMUNITY): Payer: Self-pay | Admitting: Family Medicine

## 2021-06-22 ENCOUNTER — Other Ambulatory Visit: Payer: Self-pay

## 2021-06-22 ENCOUNTER — Inpatient Hospital Stay (HOSPITAL_COMMUNITY): Payer: Medicaid Other | Admitting: Anesthesiology

## 2021-06-22 DIAGNOSIS — O365930 Maternal care for other known or suspected poor fetal growth, third trimester, not applicable or unspecified: Secondary | ICD-10-CM

## 2021-06-22 DIAGNOSIS — O1414 Severe pre-eclampsia complicating childbirth: Secondary | ICD-10-CM

## 2021-06-22 DIAGNOSIS — Z3A36 36 weeks gestation of pregnancy: Secondary | ICD-10-CM

## 2021-06-22 LAB — CBC
HCT: 27.9 % — ABNORMAL LOW (ref 36.0–46.0)
Hemoglobin: 8.7 g/dL — ABNORMAL LOW (ref 12.0–15.0)
MCH: 25.3 pg — ABNORMAL LOW (ref 26.0–34.0)
MCHC: 31.2 g/dL (ref 30.0–36.0)
MCV: 81.1 fL (ref 80.0–100.0)
Platelets: 283 10*3/uL (ref 150–400)
RBC: 3.44 MIL/uL — ABNORMAL LOW (ref 3.87–5.11)
RDW: 13.2 % (ref 11.5–15.5)
WBC: 5.6 10*3/uL (ref 4.0–10.5)
nRBC: 0 % (ref 0.0–0.2)

## 2021-06-22 LAB — RPR: RPR Ser Ql: NONREACTIVE

## 2021-06-22 MED ORDER — LACTATED RINGERS IV SOLN: INTRAVENOUS | Status: DC

## 2021-06-22 MED ORDER — HYDRALAZINE HCL 20 MG/ML IJ SOLN: 10.0000 mg | INTRAMUSCULAR | Status: DC | PRN

## 2021-06-22 MED ORDER — DIBUCAINE (PERIANAL) 1 % EX OINT: 1.0000 "application " | TOPICAL_OINTMENT | CUTANEOUS | Status: DC | PRN

## 2021-06-22 MED ORDER — EPHEDRINE 5 MG/ML INJ: 10.0000 mg | INTRAVENOUS | Status: DC | PRN

## 2021-06-22 MED ORDER — SENNOSIDES-DOCUSATE SODIUM 8.6-50 MG PO TABS
2.0000 | ORAL_TABLET | Freq: Every day | ORAL | Status: DC
  Administered 2021-06-23: 2 via ORAL
  Filled 2021-06-22 (×2): qty 2

## 2021-06-22 MED ORDER — LACTATED RINGERS IV SOLN: 500.0000 mL | Freq: Once | INTRAVENOUS | Status: DC

## 2021-06-22 MED ORDER — MISOPROSTOL 25 MCG QUARTER TABLET: 25.0000 ug | ORAL_TABLET | ORAL | Status: DC | PRN

## 2021-06-22 MED ORDER — ACETAMINOPHEN 325 MG PO TABS
650.0000 mg | ORAL_TABLET | ORAL | Status: DC | PRN
  Administered 2021-06-23 – 2021-06-24 (×3): 650 mg via ORAL
  Filled 2021-06-22 (×3): qty 2

## 2021-06-22 MED ORDER — FENTANYL-BUPIVACAINE-NACL 0.5-0.125-0.9 MG/250ML-% EP SOLN
12.0000 mL/h | EPIDURAL | Status: DC | PRN
  Administered 2021-06-22: 12 mL/h via EPIDURAL
  Filled 2021-06-22: qty 250

## 2021-06-22 MED ORDER — LIDOCAINE HCL (PF) 1 % IJ SOLN
INTRAMUSCULAR | Status: DC | PRN
  Administered 2021-06-22 (×2): 4 mL via EPIDURAL

## 2021-06-22 MED ORDER — LABETALOL HCL 5 MG/ML IV SOLN: 80.0000 mg | INTRAVENOUS | Status: DC | PRN

## 2021-06-22 MED ORDER — MISOPROSTOL 50MCG HALF TABLET
50.0000 ug | ORAL_TABLET | ORAL | Status: DC | PRN
  Administered 2021-06-22: 50 ug via BUCCAL
  Filled 2021-06-22: qty 1

## 2021-06-22 MED ORDER — DIPHENHYDRAMINE HCL 50 MG/ML IJ SOLN: 12.5000 mg | INTRAMUSCULAR | Status: DC | PRN

## 2021-06-22 MED ORDER — TERBUTALINE SULFATE 1 MG/ML IJ SOLN: 0.2500 mg | Freq: Once | INTRAMUSCULAR | Status: DC | PRN

## 2021-06-22 MED ORDER — WITCH HAZEL-GLYCERIN EX PADS: 1.0000 "application " | MEDICATED_PAD | CUTANEOUS | Status: DC | PRN

## 2021-06-22 MED ORDER — TETANUS-DIPHTH-ACELL PERTUSSIS 5-2.5-18.5 LF-MCG/0.5 IM SUSY: 0.5000 mL | PREFILLED_SYRINGE | Freq: Once | INTRAMUSCULAR | Status: DC

## 2021-06-22 MED ORDER — BENZOCAINE-MENTHOL 20-0.5 % EX AERO: 1.0000 "application " | INHALATION_SPRAY | CUTANEOUS | Status: DC | PRN

## 2021-06-22 MED ORDER — ONDANSETRON HCL 4 MG/2ML IJ SOLN: 4.0000 mg | INTRAMUSCULAR | Status: DC | PRN

## 2021-06-22 MED ORDER — PRENATAL MULTIVITAMIN CH
1.0000 | ORAL_TABLET | Freq: Every day | ORAL | Status: DC
  Administered 2021-06-23: 1 via ORAL
  Filled 2021-06-22 (×2): qty 1

## 2021-06-22 MED ORDER — LABETALOL HCL 5 MG/ML IV SOLN
20.0000 mg | INTRAVENOUS | Status: DC | PRN
  Administered 2021-06-22: 20 mg via INTRAVENOUS
  Filled 2021-06-22: qty 4

## 2021-06-22 MED ORDER — NIFEDIPINE ER OSMOTIC RELEASE 30 MG PO TB24
30.0000 mg | ORAL_TABLET | Freq: Every day | ORAL | Status: DC
  Administered 2021-06-22: 30 mg via ORAL
  Filled 2021-06-22: qty 1

## 2021-06-22 MED ORDER — OXYTOCIN-SODIUM CHLORIDE 30-0.9 UT/500ML-% IV SOLN
1.0000 m[IU]/min | INTRAVENOUS | Status: DC
  Administered 2021-06-22: 2 m[IU]/min via INTRAVENOUS
  Filled 2021-06-22: qty 500

## 2021-06-22 MED ORDER — SIMETHICONE 80 MG PO CHEW: 80.0000 mg | CHEWABLE_TABLET | ORAL | Status: DC | PRN

## 2021-06-22 MED ORDER — COCONUT OIL OIL: 1.0000 "application " | TOPICAL_OIL | Status: DC | PRN

## 2021-06-22 MED ORDER — ONDANSETRON HCL 4 MG PO TABS: 4.0000 mg | ORAL_TABLET | ORAL | Status: DC | PRN

## 2021-06-22 MED ORDER — DIPHENHYDRAMINE HCL 25 MG PO CAPS: 25.0000 mg | ORAL_CAPSULE | Freq: Four times a day (QID) | ORAL | Status: DC | PRN

## 2021-06-22 MED ORDER — LABETALOL HCL 5 MG/ML IV SOLN
40.0000 mg | INTRAVENOUS | Status: DC | PRN
  Administered 2021-06-22: 40 mg via INTRAVENOUS
  Filled 2021-06-22 (×2): qty 8

## 2021-06-22 MED ORDER — ZOLPIDEM TARTRATE 5 MG PO TABS
5.0000 mg | ORAL_TABLET | Freq: Every evening | ORAL | Status: DC | PRN
  Administered 2021-06-23: 5 mg via ORAL
  Filled 2021-06-22: qty 1

## 2021-06-22 MED ORDER — IBUPROFEN 600 MG PO TABS
600.0000 mg | ORAL_TABLET | Freq: Four times a day (QID) | ORAL | Status: DC
  Administered 2021-06-22 – 2021-06-24 (×6): 600 mg via ORAL
  Filled 2021-06-22 (×9): qty 1

## 2021-06-22 MED ORDER — PHENYLEPHRINE 40 MCG/ML (10ML) SYRINGE FOR IV PUSH (FOR BLOOD PRESSURE SUPPORT): 80.0000 ug | PREFILLED_SYRINGE | INTRAVENOUS | Status: DC | PRN

## 2021-06-22 NOTE — Anesthesia Procedure Notes (Signed)
Epidural ?Patient location during procedure: OB ?Start time: 06/22/2021 11:19 AM ?End time: 06/22/2021 11:22 AM ? ?Staffing ?Anesthesiologist: Kaylyn Layer, MD ?Performed: anesthesiologist  ? ?Preanesthetic Checklist ?Completed: patient identified, IV checked, risks and benefits discussed, monitors and equipment checked, pre-op evaluation and timeout performed ? ?Epidural ?Patient position: sitting ?Prep: DuraPrep and site prepped and draped ?Patient monitoring: continuous pulse ox, blood pressure and heart rate ?Approach: midline ?Location: L3-L4 ?Injection technique: LOR air ? ?Needle:  ?Needle type: Tuohy  ?Needle gauge: 17 G ?Needle length: 9 cm ?Needle insertion depth: 5 cm ?Catheter type: closed end flexible ?Catheter size: 19 Gauge ?Catheter at skin depth: 10 cm ?Test dose: negative and Other (1% lidocaine) ? ?Assessment ?Events: blood not aspirated, injection not painful, no injection resistance, no paresthesia and negative IV test ? ?Additional Notes ?Patient identified. Risks, benefits, and alternatives discussed with patient including but not limited to bleeding, infection, nerve damage, paralysis, failed block, incomplete pain control, headache, blood pressure changes, nausea, vomiting, reactions to medication, itching, and postpartum back pain. Confirmed with bedside nurse the patient's most recent platelet count. Confirmed with patient that they are not currently taking any anticoagulation, have any bleeding history, or any family history of bleeding disorders. Patient expressed understanding and wished to proceed. All questions were answered. Sterile technique was used throughout the entire procedure. Please see nursing notes for vital signs.  ? ?Crisp LOR on first pass. Test dose was given through epidural catheter and negative prior to continuing to dose epidural or start infusion. Warning signs of high block given to the patient including shortness of breath, tingling/numbness in hands, complete  motor block, or any concerning symptoms with instructions to call for help. Patient was given instructions on fall risk and not to get out of bed. All questions and concerns addressed with instructions to call with any issues or inadequate analgesia.  Reason for block:procedure for pain ? ? ? ?

## 2021-06-22 NOTE — Progress Notes (Signed)
Sharon Johnston is a 31 y.o. [id number] at [redacted]w[redacted]d admitted for IOL d/t cHTN w/ SI pre-e ? ?Subjective: ?Sharon Johnston is doing well this morning. Reports mild contractions about every 10 minutes. She denies HA, vision changes, or other concerning symptoms.  ? ?Objective: ?BP (!) 142/109   Pulse 78   Temp 97.7 ?F (36.5 ?C) (Oral)   Resp 16   Ht 5\' 3"  (1.6 m)   Wt 72.7 kg   LMP 10/16/2020 (Approximate)   SpO2 100%   BMI 28.40 kg/m?  ?I/O last 3 completed shifts: ?In: 2374.6 [P.O.:960; I.V.:1064.6; IV Piggyback:350] ?Out: 850 [Urine:850] ?Total I/O ?In: 60 [P.O.:240; I.V.:250] ?Out: 430 [Urine:430] ? ?FHT: 130 bpm, moderate variability, +15x15 accels, no decels ?UC: Q 10 mins ?SVE:   Dilation: 3 ?Effacement (%): 50 ?Station: -2 ?Exam by:: Keah Lamba, cnm ? ?Labs: ?Lab Results  ?Component Value Date  ? WBC 4.7 06/21/2021  ? HGB 8.9 (L) 06/21/2021  ? HCT 28.3 (L) 06/21/2021  ? MCV 82.0 06/21/2021  ? PLT 280 06/21/2021  ? ? ?Assessment / Plan: ?Sharon Johnston is a 31 y.o. [id number] at [redacted]w[redacted]d admitted for IOL d/t cHTN w/ SI pre-e ? ?Labor: s/p Cyto x1. I discussed AROM vs low dose Pitocin. Reviewed risks and benefits of both options. Patient declines AROM at this time and would like to start Pitocin. Has history of rapid labor and would like epidural. Will start low dose Pit with plans to AROM. ?Preeclampsia:  on magnesium sulfate, no signs or symptoms of toxicity, intake and ouput balanced, and labs stable ?Fetal Wellbeing:  Category I ?Pain Control:  epidural prn ?I/D:  GBS unknown > PCN ?Anticipated MOD:  NSVD ? ? ?Renee Harder, CNM ?06/22/2021, 9:15 AM ? ? ?

## 2021-06-22 NOTE — Discharge Summary (Signed)
? ?  Postpartum Discharge Summary ? ? ?   ?Patient Name: Sharon Johnston ?DOB: June 12, 1990 ?MRN: [id number] ? ?Date of admission: 06/21/2021 ?Delivery date:06/22/2021  ?Delivering provider: Maryagnes Amos L  ?Date of discharge: 06/24/2021 ? ?Admitting diagnosis: Severe preeclampsia [O14.10] ?Intrauterine pregnancy: [redacted]w[redacted]d     ?Secondary diagnosis:  Principal Problem: ?  Severe preeclampsia ?Active Problems: ?  Depression ?  Chronic hypertension ?  SVD (spontaneous vaginal delivery) ? ?Additional problems: undesired fertility    ?Discharge diagnosis: Term Pregnancy Delivered and CHTN with superimposed preeclampsia                                              ?Post partum procedures:postpartum tubal ligation ?Augmentation: AROM, Pitocin, and Cytotec ?Complications: None ? ?Hospital course: Induction of Labor With Vaginal Delivery   ?31 y.o. yo [id number] at [redacted]w[redacted]d was admitted to the hospital 06/21/2021 for induction of labor.  Indication for induction:  cHTN with SI pre-eclampsia .  Patient had an uncomplicated labor course as follows: ?Membrane Rupture Time/Date: 2:55 PM ,06/22/2021   ?Delivery Method:Vaginal, Spontaneous  ?Episiotomy: None  ?Lacerations:  None  ?Details of delivery can be found in separate delivery note.  Patient had a postpartum course looking for 24 hours of magnesium sulfate.  She was started on oral Lasix 20 mg p.o. daily.  Her Procardia was increased to 60 mg daily.  On postpartum day 2 she underwent a postpartum tubal ligation without complication.  The patient was meeting all milestones and was deemed stable for discharge.  Patient is discharged home 06/24/21. ? ?Newborn Data: ?Birth date:06/22/2021  ?Birth time:3:36 PM  ?Gender:Female  ?Living status:Living  ?Apgars:8 ,8  ?Weight:2450 g  ? ?Magnesium Sulfate received: Yes: Seizure prophylaxis ?BMZ received: No ?Rhophylac:N/A ?MMR:N/A ?T-DaP: offer prior to discharge ?Flu: No ?Transfusion:No ? ?Physical exam  ?Vitals:  ? 06/24/21 1230 06/24/21 1235 06/24/21  1240 06/24/21 1320  ?BP:    134/77  ?Pulse:    (!) 58  ?Resp:      ?Temp:      ?TempSrc:      ?SpO2: 100% 100% 100% 97%  ?Weight:      ?Height:      ? ?General: alert, cooperative, and no distress ?Lochia: appropriate ?Uterine Fundus: firm ?Incision: Dressing is clean, dry, and intact ?DVT Evaluation: No evidence of DVT seen on physical exam. ?Labs: ?Lab Results  ?Component Value Date  ? WBC 5.6 06/22/2021  ? HGB 8.7 (L) 06/22/2021  ? HCT 27.9 (L) 06/22/2021  ? MCV 81.1 06/22/2021  ? PLT 283 06/22/2021  ? ? ?  Latest Ref Rng & Units 06/21/2021  ?  5:57 PM  ?CMP  ?Glucose 70 - 99 mg/dL 83    ?BUN 6 - 20 mg/dL 10    ?Creatinine 0.44 - 1.00 mg/dL 0.67    ?Sodium 135 - 145 mmol/L 135    ?Potassium 3.5 - 5.1 mmol/L 3.3    ?Chloride 98 - 111 mmol/L 107    ?CO2 22 - 32 mmol/L 21    ?Calcium 8.9 - 10.3 mg/dL 8.2    ?Total Protein 6.5 - 8.1 g/dL 6.3    ?Total Bilirubin 0.3 - 1.2 mg/dL 0.4    ?Alkaline Phos 38 - 126 U/L 122    ?AST 15 - 41 U/L 22    ?ALT 0 - 44 U/L 15    ? ?  Edinburgh Score: ? ?  06/23/2021  ?  8:15 PM  ?Edinburgh Postnatal Depression Scale Screening Tool  ?I have been able to laugh and see the funny side of things. 0  ?I have looked forward with enjoyment to things. 0  ?I have blamed myself unnecessarily when things went wrong. 0  ?I have been anxious or worried for no good reason. 0  ?I have felt scared or panicky for no good reason. 0  ?Things have been getting on top of me. 1  ?I have been so unhappy that I have had difficulty sleeping. 2  ?I have felt sad or miserable. 0  ?I have been so unhappy that I have been crying. 0  ?The thought of harming myself has occurred to me. 0  ?Edinburgh Postnatal Depression Scale Total 3  ? ? ? ?After visit meds:  ?Allergies as of 06/24/2021   ?No Known Allergies ?  ? ?  ?Medication List  ?  ? ?TAKE these medications   ? ?aspirin EC 81 MG tablet ?Take 1 tablet (81 mg total) by mouth daily. Swallow whole. ?  ?Blood Pressure Kit Devi ?1 kit by Does not apply route as needed. ?   ?cyclobenzaprine 10 MG tablet ?Commonly known as: FLEXERIL ?Take 1 tablet (10 mg total) by mouth 3 (three) times daily as needed for muscle spasms. ?  ?furosemide 20 MG tablet ?Commonly known as: LASIX ?Take 1 tablet (20 mg total) by mouth daily for 5 days. ?  ?Gojji Weight Scale Misc ?1 Device by Does not apply route as needed. ?  ?NIFEdipine 60 MG 24 hr tablet ?Commonly known as: ADALAT CC ?Take 1 tablet (60 mg total) by mouth daily. ?What changed:  ?medication strength ?how much to take ?  ?oxyCODONE-acetaminophen 5-325 MG tablet ?Commonly known as: PERCOCET/ROXICET ?Take 1-2 tablets by mouth every 6 (six) hours as needed. ?  ?Prenatal Vitamin 27-0.8 MG Tabs ?Take 1 tablet by mouth daily. ?  ? ?  ? ? ? ?Discharge home in stable condition ?Infant Feeding: Bottle ?Infant Disposition:NICU ?Discharge instruction: per After Visit Summary and Postpartum booklet. ?Activity: Advance as tolerated. Pelvic rest for 6 weeks.  ?Diet: routine diet ?Future Appointments: ?Future Appointments  ?Date Time Provider [place]  ?06/30/2021 10:00 AM [facility] NURSE [facility] None  ?06/30/2021 10:45 AM Lynnea Ferrier, LCSW [facility] None  ?08/04/2021 10:55 AM Woodroe Mode, MD [facility] None  ? ?Follow up Visit: ? Follow-up Information   ? ? [place] Follow up.   ?Why: pp check, they will call you with an appointment ?Contact information: ?[address] ?[place] [address] ?[phone] ? ?  ?  ? ?  ?  ? ?  ? ? ? ?Please schedule this patient for a In person postpartum visit in 6 weeks with the following provider: Any provider. ?Additional Postpartum F/U:BP check 1 week  ?High risk pregnancy complicated by: HTN ?Delivery mode:  Vaginal, Spontaneous  ?Anticipated Birth Control:  BTL done PP ? ? ?06/24/2021 ?Donnamae Jude, MD ? ? ? ?

## 2021-06-22 NOTE — Progress Notes (Signed)
Brittiney LENISE JR is a 31 y.o. [id number] at [redacted]w[redacted]d admitted for IOL d/t cHTN w/ SI pre-e ? ?Subjective: ?Shalynn is doing well. Resting comfortably with epidural. Requesting AROM now that her family is present. ? ?Objective: ?BP (!) 156/92   Pulse 77   Temp 97.7 ?F (36.5 ?C) (Axillary)   Resp 16   Ht 5\' 3"  (1.6 m)   Wt 72.7 kg   LMP 10/16/2020 (Approximate)   SpO2 98%   BMI 28.40 kg/m?  ?I/O last 3 completed shifts: ?In: 2374.6 [P.O.:960; I.V.:1064.6; IV Piggyback:350] ?Out: 850 [Urine:850] ?Total I/O ?In: 1799 [P.O.:480; I.V.:1219; IV Piggyback:100] ?Out: 1625 [Urine:1625] ? ?FHT: 115 bpm, minimal/moderate variability, +15x15 accels, no decels ?UC: Q 2-3 mins ?SVE:   Dilation: 5 ?Effacement (%): 80 ?Station: -1 ?Exam by:: Davion Flannery, cnm ? ?Labs: ?Lab Results  ?Component Value Date  ? WBC 5.6 06/22/2021  ? HGB 8.7 (L) 06/22/2021  ? HCT 27.9 (L) 06/22/2021  ? MCV 81.1 06/22/2021  ? PLT 283 06/22/2021  ? ? ?Assessment / Plan: ?Aino Giebler is a 31 y.o. 26 at [redacted]w[redacted]d admitted for IOL d/t cHTN w/ SI pre-e ? ?Labor: AROM with clear fluid. Patient and fetus tolerated procedure well.  ?Preeclampsia:  on magnesium sulfate, no signs or symptoms of toxicity, intake and ouput balanced, and labs stable ?Fetal Wellbeing:  Category I ?Pain Control:  epidural  ?I/D:  GBS unknown > PCN ?Anticipated MOD:  NSVD ? ? ? ?[redacted]w[redacted]d, CNM ?06/22/2021, 3:00 PM ? ? ?

## 2021-06-22 NOTE — Anesthesia Preprocedure Evaluation (Signed)
Anesthesia Evaluation  ?Patient identified by MRN, date of birth, ID band ?Patient awake ? ? ? ?Reviewed: ?Allergy & Precautions, Patient's Chart, lab work & pertinent test results ? ?History of Anesthesia Complications ?Negative for: history of anesthetic complications ? ?Airway ?Mallampati: II ? ?TM Distance: >3 FB ?Neck ROM: Full ? ? ? Dental ?no notable dental hx. ? ?  ?Pulmonary ?Current Smoker,  ?  ?Pulmonary exam normal ? ? ? ? ? ? ? Cardiovascular ?hypertension, Normal cardiovascular exam ? ? ?  ?Neuro/Psych ?Depression negative neurological ROS ?   ? GI/Hepatic ?negative GI ROS, Neg liver ROS,   ?Endo/Other  ?negative endocrine ROS ? Renal/GU ?negative Renal ROS  ?negative genitourinary ?  ?Musculoskeletal ?negative musculoskeletal ROS ?(+)  ? Abdominal ?  ?Peds ? Hematology ?negative hematology ROS ?(+)   ?Anesthesia Other Findings ?Day of surgery medications reviewed with patient. ? Reproductive/Obstetrics ?(+) Pregnancy (preE) ? ?  ? ? ? ? ? ? ? ? ? ? ? ? ? ?  ?  ? ? ? ? ? ? ? ? ?Anesthesia Physical ?Anesthesia Plan ? ?ASA: 3 ? ?Anesthesia Plan: Epidural  ? ?Post-op Pain Management:   ? ?Induction:  ? ?PONV Risk Score and Plan: Treatment may vary due to age or medical condition ? ?Airway Management Planned: Natural Airway ? ?Additional Equipment: Fetal Monitoring ? ?Intra-op Plan:  ? ?Post-operative Plan:  ? ?Informed Consent: I have reviewed the patients History and Physical, chart, labs and discussed the procedure including the risks, benefits and alternatives for the proposed anesthesia with the patient or authorized representative who has indicated his/her understanding and acceptance.  ? ? ? ? ? ?Plan Discussed with:  ? ?Anesthesia Plan Comments:   ? ? ? ? ? ? ?Anesthesia Quick Evaluation ? ?

## 2021-06-22 NOTE — Progress Notes (Signed)
Sharon Johnston is a 31 y.o. [id number] at [redacted]w[redacted]d admitted for IOL d/t cHTN w/ SI pre-e ? ?Subjective: ?Sharon Johnston is doing well this morning. Resting comfortably with epidural. No concerns at this time.  ? ?Objective: ?BP 123/78   Pulse 61   Temp 97.7 ?F (36.5 ?C) (Axillary)   Resp 16   Ht 5\' 3"  (1.6 m)   Wt 72.7 kg   LMP 10/16/2020 (Approximate)   SpO2 98%   BMI 28.40 kg/m?  ?I/O last 3 completed shifts: ?In: 2374.6 [P.O.:960; I.V.:1064.6; IV Piggyback:350] ?Out: 850 [Urine:850] ?Total I/O ?In: [id number] [P.O.:480; I.V.:1135; IV Piggyback:100] ?Out: 1250 [Urine:1250] ? ?FHT: 115 bpm, minimal/moderate variability, +10x10 accels, 1 small variable  ?UC: Q 5-60mins ?SVE:   Dilation: 3 ?Effacement (%): 50 ?Station: -2 ?Exam by:: k fields, rn ? ?Labs: ?Lab Results  ?Component Value Date  ? WBC 5.6 06/22/2021  ? HGB 8.7 (L) 06/22/2021  ? HCT 27.9 (L) 06/22/2021  ? MCV 81.1 06/22/2021  ? PLT 283 06/22/2021  ? ? ?Assessment / Plan: ?Sharon Johnston is a 31 y.o. [id number] at [redacted]w[redacted]d admitted for IOL d/t cHTN w/ SI pre-e ? ?Labor: Currently on Pit 58mu/min. Continue titration prn per unit policy. I discussed R/B/A to AROM. Patient prefers to wait until her mom gets off work.  ?Preeclampsia:  on magnesium sulfate, no signs or symptoms of toxicity, intake and ouput balanced, and labs stable ?Fetal Wellbeing:  Category I ?Pain Control:  epidural  ?I/D:  GBS unknown > PCN ?Anticipated MOD:  NSVD ? ? ?Renee Harder, CNM ?06/22/2021, 1:06 PM ? ? ?

## 2021-06-23 ENCOUNTER — Encounter: Payer: Self-pay | Admitting: *Deleted

## 2021-06-23 LAB — CULTURE, BETA STREP (GROUP B ONLY)

## 2021-06-23 MED ORDER — NIFEDIPINE ER OSMOTIC RELEASE 60 MG PO TB24
60.0000 mg | ORAL_TABLET | Freq: Every day | ORAL | Status: DC
  Administered 2021-06-23 – 2021-06-24 (×2): 60 mg via ORAL
  Filled 2021-06-23 (×3): qty 1

## 2021-06-23 MED ORDER — FUROSEMIDE 40 MG PO TABS
20.0000 mg | ORAL_TABLET | Freq: Every day | ORAL | Status: DC
  Administered 2021-06-23 – 2021-06-24 (×2): 20 mg via ORAL
  Filled 2021-06-23 (×3): qty 1

## 2021-06-23 NOTE — Progress Notes (Signed)
Post Partum Day 1 ?Subjective: ?no complaints ? ?Objective: ?Blood pressure 133/79, pulse 78, temperature 98.2 ?F (36.8 ?C), temperature source Oral, resp. rate 17, height 5\' 3"  (1.6 m), weight 72.7 kg, last menstrual period 10/16/2020, SpO2 100 %, unknown if currently breastfeeding. ? ?Physical Exam:  ?General: alert, cooperative, and no distress ?Lochia: appropriate ?Uterine Fundus: firm ?DVT Evaluation: No evidence of DVT seen on physical exam. ? ?Recent Labs  ?  06/21/21 ?1757 06/22/21 ?08/22/21  ?HGB 8.9* 8.7*  ?HCT 28.3* 27.9*  ? ? ?Assessment/Plan: ?Magnesium for 24 hr pp, nifedipine 30 mg daily  ? ? LOS: 2 days  ? ?8937 ?06/23/2021, 7:54 AM  ? ? ?

## 2021-06-23 NOTE — Anesthesia Postprocedure Evaluation (Signed)
Anesthesia Post Note ? ?Patient: Sharon Johnston ? ?Procedure(s) Performed: AN AD HOC LABOR EPIDURAL ? ?  ? ?Patient location during evaluation: Mother Baby ?Anesthesia Type: Epidural ?Level of consciousness: awake and alert ?Pain management: pain level controlled ?Vital Signs Assessment: post-procedure vital signs reviewed and stable ?Respiratory status: spontaneous breathing, nonlabored ventilation and respiratory function stable ?Cardiovascular status: stable ?Postop Assessment: no headache, no backache and epidural receding ?Anesthetic complications: no ? ? ?No notable events documented. ? ?Last Vitals:  ?Vitals:  ? 06/23/21 0700 06/23/21 0813  ?BP:  121/66  ?Pulse:  73  ?Resp: 17 18  ?Temp:  36.7 ?C  ?SpO2:  100%  ?  ?Last Pain:  ?Vitals:  ? 06/23/21 0823  ?TempSrc:   ?PainSc: 0-No pain  ? ?Pain Goal: Patients Stated Pain Goal: 1 (06/21/21 2226) ? ?  ?  ?  ?  ?  ?  ?Epidural/Spinal Function Cutaneous sensation: Normal sensation (06/23/21 0823) ? ?Seva Chancy ? ? ? ? ?

## 2021-06-23 NOTE — Lactation Note (Signed)
This note was copied from a baby's chart. ?Lactation Consultation Note ? ?Patient Name: Sharon Johnston ?Today's Date: 06/23/2021 ?Reason for consult: Follow-up assessment;Late-preterm 34-36.6wks ?Age:29 hours ? ?[facility] student stopped by mom's room and nursing team was getting ready to transport her to NICU to see infant. Mom states she is formula feeding only and does not desire to pump.  ? ?LC follow up complete. Mother declined further services.  ?  ? ?Consult Status ?Consult Status: Complete (mother declined follow up) ? ?Antionette Char ?06/23/2021, 9:32 AM ? ? ? ?

## 2021-06-24 ENCOUNTER — Other Ambulatory Visit (HOSPITAL_COMMUNITY): Payer: Self-pay

## 2021-06-24 ENCOUNTER — Inpatient Hospital Stay (HOSPITAL_COMMUNITY): Payer: Medicaid Other | Admitting: Anesthesiology

## 2021-06-24 ENCOUNTER — Encounter (HOSPITAL_COMMUNITY)
Admission: AD | Disposition: A | Discharge status: Home / Self Care | Payer: Self-pay | Source: Home / Self Care | Attending: Obstetrics and Gynecology

## 2021-06-24 ENCOUNTER — Encounter (HOSPITAL_COMMUNITY): Payer: Self-pay | Admitting: Obstetrics and Gynecology

## 2021-06-24 DIAGNOSIS — Z302 Encounter for sterilization: Secondary | ICD-10-CM

## 2021-06-24 HISTORY — PX: TUBAL LIGATION: SHX77

## 2021-06-24 LAB — SURGICAL PATHOLOGY

## 2021-06-24 SURGERY — LIGATION, FALLOPIAN TUBE, POSTPARTUM
Anesthesia: Epidural | Wound class: Clean Contaminated

## 2021-06-24 MED ORDER — FUROSEMIDE 20 MG PO TABS
20.0000 mg | ORAL_TABLET | Freq: Every day | ORAL | 0 refills | Status: DC
Start: 2021-06-24 — End: 2021-11-05
  Filled 2021-06-24: qty 5, 5d supply, fill #0

## 2021-06-24 MED ORDER — KETOROLAC TROMETHAMINE 30 MG/ML IJ SOLN
INTRAMUSCULAR | Status: DC | PRN
  Administered 2021-06-24: 30 mg via INTRAVENOUS

## 2021-06-24 MED ORDER — OXYCODONE-ACETAMINOPHEN 5-325 MG PO TABS: 1.0000 | ORAL_TABLET | Freq: Four times a day (QID) | ORAL | 0 refills | Status: DC | PRN

## 2021-06-24 MED ORDER — DEXMEDETOMIDINE (PRECEDEX) IN NS 20 MCG/5ML (4 MCG/ML) IV SYRINGE
PREFILLED_SYRINGE | INTRAVENOUS | Status: AC
  Filled 2021-06-24: qty 5

## 2021-06-24 MED ORDER — OXYCODONE-ACETAMINOPHEN 5-325 MG PO TABS
1.0000 | ORAL_TABLET | Freq: Four times a day (QID) | ORAL | 0 refills | Status: DC | PRN
  Filled 2021-06-24: qty 5, 1d supply, fill #0

## 2021-06-24 MED ORDER — LIDOCAINE-EPINEPHRINE (PF) 2 %-1:200000 IJ SOLN
INTRAMUSCULAR | Status: AC
  Filled 2021-06-24: qty 20

## 2021-06-24 MED ORDER — ONDANSETRON HCL 4 MG/2ML IJ SOLN
INTRAMUSCULAR | Status: DC | PRN
  Administered 2021-06-24: 4 mg via INTRAVENOUS

## 2021-06-24 MED ORDER — PROPOFOL 10 MG/ML IV BOLUS
INTRAVENOUS | Status: DC | PRN
  Administered 2021-06-24: 20 ug via INTRAVENOUS

## 2021-06-24 MED ORDER — FENTANYL CITRATE (PF) 100 MCG/2ML IJ SOLN: 25.0000 ug | INTRAMUSCULAR | Status: DC | PRN

## 2021-06-24 MED ORDER — FENTANYL CITRATE (PF) 100 MCG/2ML IJ SOLN
INTRAMUSCULAR | Status: AC
Start: 2021-06-24 — End: ?
  Filled 2021-06-24: qty 2

## 2021-06-24 MED ORDER — PROPOFOL 10 MG/ML IV BOLUS
INTRAVENOUS | Status: AC
  Filled 2021-06-24: qty 20

## 2021-06-24 MED ORDER — FENTANYL CITRATE (PF) 100 MCG/2ML IJ SOLN
INTRAMUSCULAR | Status: DC | PRN
  Administered 2021-06-24: 100 ug via EPIDURAL

## 2021-06-24 MED ORDER — MIDAZOLAM HCL 2 MG/2ML IJ SOLN
INTRAMUSCULAR | Status: DC | PRN
  Administered 2021-06-24 (×2): 1 mg via INTRAVENOUS

## 2021-06-24 MED ORDER — NIFEDIPINE ER 60 MG PO TB24
60.0000 mg | ORAL_TABLET | Freq: Every day | ORAL | 2 refills | Status: DC
  Filled 2021-06-24: qty 60, 60d supply, fill #0
  Filled 2022-01-23 – 2022-04-14 (×2): qty 60, 60d supply, fill #1

## 2021-06-24 MED ORDER — ONDANSETRON HCL 4 MG/2ML IJ SOLN
INTRAMUSCULAR | Status: AC
  Filled 2021-06-24: qty 2

## 2021-06-24 MED ORDER — FENTANYL CITRATE (PF) 100 MCG/2ML IJ SOLN
INTRAMUSCULAR | Status: AC
  Filled 2021-06-24: qty 2

## 2021-06-24 MED ORDER — KETOROLAC TROMETHAMINE 30 MG/ML IJ SOLN
INTRAMUSCULAR | Status: AC
  Filled 2021-06-24: qty 1

## 2021-06-24 MED ORDER — BUPIVACAINE HCL (PF) 0.25 % IJ SOLN
INTRAMUSCULAR | Status: AC
  Filled 2021-06-24: qty 30

## 2021-06-24 MED ORDER — MIDAZOLAM HCL 2 MG/2ML IJ SOLN
INTRAMUSCULAR | Status: AC
  Filled 2021-06-24: qty 2

## 2021-06-24 MED ORDER — DEXMEDETOMIDINE (PRECEDEX) IN NS 20 MCG/5ML (4 MCG/ML) IV SYRINGE
PREFILLED_SYRINGE | INTRAVENOUS | Status: DC | PRN
  Administered 2021-06-24: 8 ug via INTRAVENOUS
  Administered 2021-06-24: 4 ug via INTRAVENOUS
  Administered 2021-06-24: 8 ug via INTRAVENOUS

## 2021-06-24 MED ORDER — KETOROLAC TROMETHAMINE 30 MG/ML IJ SOLN: 30.0000 mg | Freq: Once | INTRAMUSCULAR | Status: DC | PRN

## 2021-06-24 MED ORDER — FENTANYL CITRATE (PF) 100 MCG/2ML IJ SOLN
INTRAMUSCULAR | Status: DC | PRN
  Administered 2021-06-24 (×2): 50 ug via INTRAVENOUS

## 2021-06-24 MED ORDER — SODIUM BICARBONATE 8.4 % IV SOLN
INTRAVENOUS | Status: DC | PRN
  Administered 2021-06-24 (×4): 5 mL via EPIDURAL

## 2021-06-24 MED ORDER — BUPIVACAINE HCL (PF) 0.25 % IJ SOLN
INTRAMUSCULAR | Status: DC | PRN
  Administered 2021-06-24: 20 mL
  Administered 2021-06-24: 10 mL

## 2021-06-24 SURGICAL SUPPLY — 26 items
BLADE SURG 11 STRL SS (BLADE) ×1 IMPLANT
BLADE SURG 15 STRL LF C SS BP (BLADE) IMPLANT
BLADE SURG 15 STRL SS (BLADE) ×2
CLIP FILSHIE TUBAL LIGA STRL (Clip) ×1 IMPLANT
CLOTH BEACON ORANGE TIMEOUT ST (SAFETY) ×2 IMPLANT
DRESSING OPSITE X SMALL 2X3 (GAUZE/BANDAGES/DRESSINGS) ×1 IMPLANT
DRSG OPSITE POSTOP 3X4 (GAUZE/BANDAGES/DRESSINGS) ×2 IMPLANT
DURAPREP 26ML APPLICATOR (WOUND CARE) ×2 IMPLANT
GLOVE BIO SURGEON STRL SZ7.5 (GLOVE) ×2 IMPLANT
GLOVE BIOGEL PI IND STRL 7.0 (GLOVE) ×2 IMPLANT
GLOVE BIOGEL PI INDICATOR 7.0 (GLOVE) ×2
GOWN STRL REUS W/TWL LRG LVL3 (GOWN DISPOSABLE) ×2 IMPLANT
GOWN STRL REUS W/TWL XL LVL3 (GOWN DISPOSABLE) ×2 IMPLANT
NEEDLE HYPO 22GX1.5 SAFETY (NEEDLE) ×2 IMPLANT
NS IRRIG 1000ML POUR BTL (IV SOLUTION) ×2 IMPLANT
PACK ABDOMINAL MINOR (CUSTOM PROCEDURE TRAY) ×2 IMPLANT
PROTECTOR NERVE ULNAR (MISCELLANEOUS) ×2 IMPLANT
SPONGE LAP 4X18 RFD (DISPOSABLE) IMPLANT
SUT MNCRL AB 4-0 PS2 18 (SUTURE) ×2 IMPLANT
SUT PLAIN 0 NONE (SUTURE) ×2 IMPLANT
SUT VIC AB 0 CT1 27 (SUTURE) ×2
SUT VIC AB 0 CT1 27XBRD ANBCTR (SUTURE) ×1 IMPLANT
SYR CONTROL 10ML LL (SYRINGE) ×2 IMPLANT
TOWEL OR 17X24 6PK STRL BLUE (TOWEL DISPOSABLE) ×4 IMPLANT
TRAY FOLEY CATH SILVER 14FR (SET/KITS/TRAYS/PACK) ×2 IMPLANT
WATER STERILE IRR 1000ML POUR (IV SOLUTION) ×2 IMPLANT

## 2021-06-24 NOTE — Progress Notes (Addendum)
POSTPARTUM PROGRESS NOTE ? ?PPD #2 ? ?Subjective: ? ?Golda JENILLE LASZLO is a 31 y.o. [id number] s/p NSVD at [redacted]w[redacted]d. Today she notes no acute complaints or concerns.  NPO at midnight due to scheduled BTL. She denies any problems with ambulating, voiding.  Denies nausea or vomiting. She has passed flatus, + BM.  Pain is well controlled.  Lochia minimal ?Denies fever/chills/chest pain/SOB.  no HA, no blurry vision, noRUQ pain ? ?Objective: ?Blood pressure 133/70, pulse 65, temperature 98.8 ?F (37.1 ?C), temperature source Oral, resp. rate 17, height 5\' 3"  (1.6 m), weight 72.7 kg, last menstrual period 10/16/2020, SpO2 99 %, unknown if currently breastfeeding. ? ?Physical Exam:  ?General: alert, cooperative and no distress ?Chest: no respiratory distress ?Heart: regular rate and rhythm ?Abdomen: soft, nontender ?Uterine Fundus: firm, below umbilicus ?DVT Evaluation: No calf swelling or tenderness ?Extremities: no edema ?Skin: warm, dry ? ?Assessment/Plan: ?Logann LENDA BARATTA is a 31 y.o. 26 s/p NSVD at [redacted]w[redacted]d PPD#2 complicated by: ?1) cHTN with superimposed preeclampsia ?-s/p Magnesium ?-pt asymptomatic, BP well controlled on Procardia XL 60mg  daily ?-continue Lasix x 5 days ? ?2) Contraception ?-NPO ?-plan for BTL later today ?- Bilateral tubal ligation reviewed with R&B including but not limited to bleeding, infection, injury to other organs, irreversibility and failure rate of 1%. Questions and concerns were addressed and she desires to proceed.  Consent obtained ? ?3) postpartum care ?-pain well controlled ?-meeting milestones appropriately ? ?Feeding: bottle ? ?Dispo: Continue care as outlined above, possible discharge home later today pending postop management ? ? LOS: 3 days  ? ?[redacted]w[redacted]d, DO ?Faculty Attending, Center for ?06/24/2021, 7:01 AM  ?

## 2021-06-24 NOTE — Op Note (Signed)
Sharon Johnston ?06/24/2021 ? ?PREOPERATIVE DIAGNOSIS:  Multiparity, undesired fertility ? ?POSTOPERATIVE DIAGNOSIS:  Multiparity, undesired fertility ? ?PROCEDURE:  Postpartum Bilateral Tubal Sterilization using Modified Pomeroy method  ? ?ANESTHESIA:  Epidural ? ?COMPLICATIONS:  None immediate. ? ?SURGEON: Dr. Arlina Robes  ? ?ASSISTANT: Dr. Darrelyn Hillock  ? ?ESTIMATED BLOOD LOSS:  < 5 ml  ? ?FLUIDS: 600 ml LR. ? ?URINE OUTPUT:  25 ml of clear urine. ? ?INDICATIONS: 31 y.o. [id number]  with undesired fertility,status post vaginal delivery, desires permanent sterilization. Risks and benefits of procedure discussed with patient including permanence of method, bleeding, infection, injury to surrounding organs and need for additional procedures. Risk failure of 0.5-1% with increased risk of ectopic gestation if pregnancy occurs was also discussed with patient. ?  ?FINDINGS:  Normal uterus, tubes, and ovaries. ? ?TECHNIQUE:  The patient was taken to the operating room where her epidural anesthesia was dosed up to surgical level and found to be adequate.  She was then placed in the dorsal supine position and prepped and draped in sterile fashion.  After an adequate timeout was performed, attention was turned to the patient's abdomen where a small transverse skin incision was made under the umbilical fold. Local Marcaine was injected. The incision was taken down to the layer of fascia using the scalpel, and fascia was incised, and extended bilaterally using Mayo scissors. The peritoneum was entered in a sharp fashion. Attention was then turned to the patient's uterus, and right fallopian tube was identified and followed out to the fimbriated end. An avascular midsection of the tube approximately 3-4cm from the cornua was grasped with the Babcock clamps and brought into a knuckle. The tube was double ligated with 3-0 plain gut suture and the intervening portion of tube was transected and removed. Excellent hemostasis was  noted.  Attention was then turned to the left fallopian tube, after confirmation of identification by tracing the tube out to the fimbriae. The same procedure was then performed on the left Fallopian tube with excellent hemostasis noted. ? ?The instruments were then removed from the patient's abdomen and the fascial incision was repaired with 0 Vicryl, and the skin was closed with a 3-0 Monocryl subcuticular stitch. The patient tolerated the procedure well.  Sponge, lap, and needle counts were correct times two.  The patient was then taken to the recovery room awake, extubated and in stable condition. ? ?Darrelyn Hillock, DO  ?06/24/2021 ?11:43 AM ? ?  ?

## 2021-06-24 NOTE — Plan of Care (Signed)
  Problem: Health Behavior/Discharge Planning: Goal: Ability to manage health-related needs will improve Outcome: Adequate for Discharge   

## 2021-06-24 NOTE — Clinical Social Work Maternal (Signed)
?CLINICAL SOCIAL WORK MATERNAL/CHILD NOTE ? ?Patient Details  ?Name: Sharon Johnston ?MRN: [id number] ?Date of Birth: 04/15/1990 ? ?Date:  09-11-21 ? ?Clinical Social Worker Initiating Note:  Nurse, learning disability Date/Time: Initiated:  06/24/21/1307    ? ?Child's Name:  Sharon Johnston  ? ?Biological Parents:  Mother, Father (MOB communicated that FOB is Sharon Johnston 03/13/2001 and per Nicki Reaper is currently incarcerated.)  ? ?Need for Interpreter:  None  ? ?Reason for Referral:     ? ?Address:  [address] ?[place] [address]  ?  ?Phone number:  [phone] (home)    ? ?Additional phone number: MOB's Aunt Number Sharon Johnston [phone] ? ?Household Members/Support Persons (HM/SP):   Household Member/Support Person 1, Household Member/Support Person 2, Household Member/Support Person 3, Household Member/Support Person 4 (MOB communicated that her children are currently in the custody of her aunt Sharon Johnston per CPS. [phone]) ? ? ?HM/SP Name Relationship DOB or Age  ?HM/SP -1 Sharon Johnston 10/18/11 son  ?HM/SP -2 Sharon Johnston daughter 08/11/13  ?HM/SP -3 Sharon Johnston daughter 10/08/14  ?HM/SP -4 Sharon Johnston son 04/23/17  ?HM/SP -5        ?HM/SP -6        ?HM/SP -7        ?HM/SP -8        ? ? ?Natural Supports (not living in the home):  Immediate Family, Extended Family  ? ?Professional Supports: None  ? ?Employment: Unemployed  ? ?Type of Work:    ? ?Education:  High school graduate  ? ?Homebound arranged:   ? ?Financial Resources:  Medicaid  ? ?Other Resources:  Physicist, medical   (CSW provided MOB with information to apply for [facility].)  ? ?Cultural/Religious Considerations Which May Impact Care:  Per MOB's Face Sheet, MOB is Non-Denominational.  ? ?Strengths:  Ability to meet basic needs  , Pediatrician chosen, Home prepared for child    ? ?Psychotropic Medications:        ? ?Pediatrician:    [place] area ? ?Pediatrician List:  ? ?[facility] ([address])  ?[place]    ?[facility]    ?[facility]    ?[facility]    ?[facility]    ? ? ?Pediatrician Fax Number:   ? ?Risk Factors/Current Problems:  Mental Health Concerns    ? ?Cognitive State:  Alert  , Insightful  , Linear Thinking    ? ?Mood/Affect:  Calm  , Interested  , Apprehensive  , Relaxed    ? ?CSW Assessment: CSW met with MOB in room 106 to complete an assessment for MH hx. When CSW arrived, MOB appeared apprehensive. CSW introduced self and explained CSW's role.  MOB was polite and forthcoming.  ? ?CSW asked about MOB's MH hx.  MOB reported that her hx was years ago and there is nothing currently. MOB denied having nay symptoms during this pregnancy and MOB declined PMAD education.  ? ?When CSW asked about MOB's children, MOB vaguely disclosed that her children are currently in the custody of her aunt Sharon Johnston per CPS. MOB appeared irritated has CSW asked confirming questions.  MOB reported that her case is currently closed however, she was unable to recall a date for CPS case closure. MOB could not recall the reason for having CPS involved. CSW made MOB aware that CSW will make a report to [facility] CPS due to MOB's hx; MOB was understanding.  ? ?MOB reported having all  essential items for infant including a new car seat and bassinet.  MOB denied barriers to visiting with infant post MOB's discharge.   ? ?CSW left a message for [facility] CPS to make a report.  ? ?There are barriers to infant's discharge. ? ?CSW Plan/Description:  Psychosocial Support and Ongoing Assessment of Needs, Sudden Infant Death Syndrome (SIDS) Education, Perinatal Mood and Anxiety Disorder (PMADs) Education, Other Patient/Family Education, Child Protective Service Report   (CPS report made for MOB's CPS hx.)  ? ?Sharon Johnston, MSW, LCSW ?Clinical Social Work ?[phone] ? ?Sharon Mcgillivray D BOYD-GILYARD, LCSW ?06/24/2021, 1:14 PM ? ?

## 2021-06-24 NOTE — Progress Notes (Signed)
Pt discharged to nicu to be with baby  babyscript reviewed and b/p cuff given  pt downloaded app  and b/p taken  pt refused T-dapt  vaccine ?

## 2021-06-24 NOTE — Transfer of Care (Signed)
Immediate Anesthesia Transfer of Care Note ? ?Patient: Sharon Johnston ? ?Procedure(s) Performed: POST PARTUM TUBAL LIGATION ? ?Patient Location: PACU ? ?Anesthesia Type:Epidural ? ?Level of Consciousness: awake ? ?Airway & Oxygen Therapy: Patient Spontanous Breathing ? ?Post-op Assessment: Report given to RN ? ?Post vital signs: Reviewed and stable ? ?Last Vitals:  ?Vitals Value Taken Time  ?BP 116/69 06/24/21 1047  ?Temp    ?Pulse 51 06/24/21 1049  ?Resp 11 06/24/21 1049  ?SpO2 99 % 06/24/21 1049  ?Vitals shown include unvalidated device data. ? ?Last Pain:  ?Vitals:  ? 06/24/21 0800  ?TempSrc: Oral  ?PainSc:   ?   ? ?Patients Stated Pain Goal: 1 (06/21/21 2226) ? ?Complications: No notable events documented. ?

## 2021-06-24 NOTE — Anesthesia Postprocedure Evaluation (Signed)
Anesthesia Post Note ? ?Patient: Sharon Johnston ? ?Procedure(s) Performed: POST PARTUM TUBAL LIGATION ? ?  ? ?Patient location during evaluation: PACU ?Anesthesia Type: Epidural ?Level of consciousness: oriented and awake and alert ?Pain management: pain level controlled ?Vital Signs Assessment: post-procedure vital signs reviewed and stable ?Respiratory status: spontaneous breathing, respiratory function stable and patient connected to nasal cannula oxygen ?Cardiovascular status: blood pressure returned to baseline and stable ?Postop Assessment: no headache, no backache, no apparent nausea or vomiting and epidural receding ?Anesthetic complications: no ? ? ?No notable events documented. ? ?Last Vitals:  ?Vitals:  ? 06/24/21 1240 06/24/21 1320  ?BP:  134/77  ?Pulse:  (!) 58  ?Resp:    ?Temp:    ?SpO2: 100% 97%  ?  ?Last Pain:  ?Vitals:  ? 06/24/21 1310  ?TempSrc:   ?PainSc: 8   ? ?Pain Goal: Patients Stated Pain Goal: 1 (06/21/21 2226) ? ?  ?  ?  ?  ?  ?  ?  ? ?Georgios Kina L Mont Jagoda ? ? ? ? ?

## 2021-06-24 NOTE — Anesthesia Preprocedure Evaluation (Addendum)
Anesthesia Evaluation  ?Patient identified by MRN, date of birth, ID band ?Patient awake ? ? ? ?Reviewed: ?Allergy & Precautions, NPO status , Patient's Chart, lab work & pertinent test results ? ?Airway ?Mallampati: II ? ?TM Distance: >3 FB ?Neck ROM: Full ? ? ? Dental ?no notable dental hx. ?(+) Teeth Intact, Dental Advisory Given ?  ?Pulmonary ?Current Smoker and Patient abstained from smoking.,  ?  ?Pulmonary exam normal ?breath sounds clear to auscultation ? ? ? ? ? ? Cardiovascular ?hypertension, Pt. on medications ?Normal cardiovascular exam ?Rhythm:Regular Rate:Normal ? ? ?  ?Neuro/Psych ?PSYCHIATRIC DISORDERS Depression negative neurological ROS ?   ? GI/Hepatic ?negative GI ROS, Neg liver ROS,   ?Endo/Other  ?negative endocrine ROS ? Renal/GU ?negative Renal ROS  ?negative genitourinary ?  ?Musculoskeletal ?negative musculoskeletal ROS ?(+)  ? Abdominal ?  ?Peds ? Hematology ? ?(+) Blood dyscrasia, anemia ,   ?Anesthesia Other Findings ?PPTL after IOL for cHTN with SIPE with severe features ? Reproductive/Obstetrics ? ?  ? ? ? ? ? ? ? ? ? ? ? ? ? ?  ?  ? ? ? ? ? ? ? ? ?Anesthesia Physical ?Anesthesia Plan ? ?ASA: 2 ? ?Anesthesia Plan: Epidural  ? ?Post-op Pain Management:   ? ?Induction:  ? ?PONV Risk Score and Plan: Treatment may vary due to age or medical condition and Midazolam ? ?Airway Management Planned: Natural Airway ? ?Additional Equipment:  ? ?Intra-op Plan:  ? ?Post-operative Plan:  ? ?Informed Consent: I have reviewed the patients History and Physical, chart, labs and discussed the procedure including the risks, benefits and alternatives for the proposed anesthesia with the patient or authorized representative who has indicated his/her understanding and acceptance.  ? ? ? ? ? ?Plan Discussed with: Anesthesiologist ? ?Anesthesia Plan Comments: (Patient identified. Risks, benefits, options discussed with patient including but not limited to bleeding, infection,  nerve damage, paralysis, failed block, incomplete pain control, headache, blood pressure changes, nausea, vomiting, reactions to medication, itching, and post partum back pain. Confirmed with bedside nurse the patient's most recent platelet count. Confirmed with the patient that they are not taking any anticoagulation, have any bleeding history or any family history of bleeding disorders. Patient expressed understanding and wishes to proceed. All questions were answered. )  ? ? ? ? ? ? ?Anesthesia Quick Evaluation ? ?

## 2021-06-25 ENCOUNTER — Ambulatory Visit: Payer: Self-pay

## 2021-06-25 NOTE — Lactation Note (Signed)
This note was copied from a baby's chart. ?Lactation Consultation Note ?RN called for [facility] assistance d/t mom is engorged and in pain. LC suggested ICE. They asked about pumping and LC coming to assist. ?LC started mom using hand pump, LC massaged mom's breast then followed by ICE. Encouraged mom to lay flat and apply ICE for 20 minutes. Encouraged mom to get cabbage and apply to breast and drink sage tea. Only pump to relieve if can't tolerate. ?ICE 20 minutes at time, alternating on breast and wear tight fitting bra or binder.  ?Mom pumped 70 ml BM. Placed in refrigerator. ? ?Patient Name: Sharon Johnston ?Today's Date: 06/25/2021 ?Reason for consult: Mother's request ?Age:29 hours ? ?Maternal Data ?  ? ?Feeding ?  ? ?LATCH Score ?  ? ?  ? ?  ? ?Comfort (Breast/Nipple): Engorged, cracked, bleeding, large blisters, severe discomfort ? ?  ? ?  ? ? ?Lactation Tools Discussed/Used ?  ? ?Interventions ?  ? ?Discharge ?  ? ?Consult Status ?Consult Status: Complete ? ? ? ?Charyl Dancer ?06/25/2021, 10:42 PM ? ? ? ?

## 2021-06-26 ENCOUNTER — Other Ambulatory Visit: Payer: Self-pay

## 2021-06-26 ENCOUNTER — Encounter (HOSPITAL_COMMUNITY): Payer: Self-pay | Admitting: Obstetrics and Gynecology

## 2021-06-26 ENCOUNTER — Inpatient Hospital Stay (HOSPITAL_COMMUNITY)
Admission: AD | Admit: 2021-06-26 | Discharge: 2021-06-26 | Disposition: A | Discharge status: Home / Self Care | Payer: Medicaid Other | Attending: Obstetrics and Gynecology | Admitting: Obstetrics and Gynecology

## 2021-06-26 DIAGNOSIS — R109 Unspecified abdominal pain: Secondary | ICD-10-CM | POA: Insufficient documentation

## 2021-06-26 DIAGNOSIS — G8918 Other acute postprocedural pain: Secondary | ICD-10-CM | POA: Insufficient documentation

## 2021-06-26 DIAGNOSIS — O9089 Other complications of the puerperium, not elsewhere classified: Secondary | ICD-10-CM | POA: Diagnosis present

## 2021-06-26 LAB — CBC WITH DIFFERENTIAL/PLATELET
Abs Immature Granulocytes: 0.04 10*3/uL (ref 0.00–0.07)
Basophils Absolute: 0 10*3/uL (ref 0.0–0.1)
Basophils Relative: 0 %
Eosinophils Absolute: 0.3 10*3/uL (ref 0.0–0.5)
Eosinophils Relative: 3 %
HCT: 31.8 % — ABNORMAL LOW (ref 36.0–46.0)
Hemoglobin: 10 g/dL — ABNORMAL LOW (ref 12.0–15.0)
Immature Granulocytes: 1 %
Lymphocytes Relative: 14 %
Lymphs Abs: 1.2 10*3/uL (ref 0.7–4.0)
MCH: 25.3 pg — ABNORMAL LOW (ref 26.0–34.0)
MCHC: 31.4 g/dL (ref 30.0–36.0)
MCV: 80.3 fL (ref 80.0–100.0)
Monocytes Absolute: 0.6 10*3/uL (ref 0.1–1.0)
Monocytes Relative: 6 %
Neutro Abs: 6.7 10*3/uL (ref 1.7–7.7)
Neutrophils Relative %: 76 %
Platelets: 427 10*3/uL — ABNORMAL HIGH (ref 150–400)
RBC: 3.96 MIL/uL (ref 3.87–5.11)
RDW: 13.2 % (ref 11.5–15.5)
WBC: 8.7 10*3/uL (ref 4.0–10.5)
nRBC: 0 % (ref 0.0–0.2)

## 2021-06-26 MED ORDER — OXYCODONE-ACETAMINOPHEN 5-325 MG PO TABS: 1.0000 | ORAL_TABLET | Freq: Four times a day (QID) | ORAL | 0 refills | Status: DC | PRN

## 2021-06-26 MED ORDER — OXYCODONE-ACETAMINOPHEN 5-325 MG PO TABS
2.0000 | ORAL_TABLET | Freq: Once | ORAL | Status: AC
  Administered 2021-06-26: 2 via ORAL
  Filled 2021-06-26: qty 2

## 2021-06-26 MED ORDER — KETOROLAC TROMETHAMINE 60 MG/2ML IM SOLN
60.0000 mg | Freq: Once | INTRAMUSCULAR | Status: AC
  Administered 2021-06-26: 60 mg via INTRAMUSCULAR
  Filled 2021-06-26: qty 2

## 2021-06-26 MED ORDER — IBUPROFEN 800 MG PO TABS: 800.0000 mg | ORAL_TABLET | Freq: Three times a day (TID) | ORAL | 0 refills | Status: DC

## 2021-06-26 NOTE — MAU Provider Note (Signed)
?History  ?  ? ?CSN: [id number] ? ?Arrival date and time: 06/26/21 0850 ? ? Event Date/Time  ? First Provider Initiated Contact with Patient 06/26/21 [phone]   ?  ? ?Chief Complaint  ?Patient presents with  ? Abdominal Pain  ? Incisional Pain  ? ?HPI ?Sharon Johnston is a 31 y.o. [id number] postpartum from a vaginal delivery on 4/4 and BTL on 4/6 who presents with abdominal and incision pain. She states she was in the NICU with her infant and the pain became a 10/10. She has not taken anything for the pain because she was only prescribed 5 percocet and no ibuprofen and she reports she is out of percocet. She reports she thinks she feels like she has a fever but has not taken her temperature. She reports she did take her blood pressure medication this morning and denies any HA, visual changes or epigastric pain. ? ?OB History as of 06/22/2021   ? ? Gravida  ?8  ? Para  ?5  ? Term  ?4  ? Preterm  ?1  ? AB  ?3  ? Living  ?5  ?  ? ? SAB  ?2  ? IAB  ?1  ? Ectopic  ?0  ? Multiple  ?0  ? Live Births  ?5  ?   ?  ?  ? ? ?Past Medical History:  ?Diagnosis Date  ? Chlamydia   ? Depression   ? hospitalized for 2 weeks after suicide att was post partum  ? Hx of gonorrhea   ? Hypertension   ? gestational HTN  ? Kidney infection   ? ? ?Past Surgical History:  ?Procedure Laterality Date  ? NO PAST SURGERIES    ? TUBAL LIGATION N/A 06/24/2021  ? Procedure: POST PARTUM TUBAL LIGATION;  Surgeon: Chancy Milroy, MD;  Location: [facility] LD ORS;  Service: Gynecology;  Laterality: N/A;  ? ? ?Family History  ?Problem Relation Age of Onset  ? Hypertension Mother   ? Hypertension Maternal Grandmother   ? Healthy Father   ? Hypertension Maternal Aunt   ? Depression Cousin   ? ? ?Social History  ? ?Tobacco Use  ? Smoking status: Some Days  ?  Types: Cigars  ? Smokeless tobacco: Never  ? Tobacco comments:  ?  two black and milds per day  ?Vaping Use  ? Vaping Use: Never used  ?Substance Use Topics  ? Alcohol use: No  ? Drug use: No  ? ? ?Allergies: No Known  Allergies ? ?Medications Prior to Admission  ?Medication Sig Dispense Refill Last Dose  ? aspirin EC 81 MG tablet Take 1 tablet (81 mg total) by mouth daily. Swallow whole. 100 tablet 3   ? Blood Pressure Monitoring (BLOOD PRESSURE KIT) DEVI 1 kit by Does not apply route as needed. 1 each 0   ? cyclobenzaprine (FLEXERIL) 10 MG tablet Take 1 tablet (10 mg total) by mouth 3 (three) times daily as needed for muscle spasms. (Patient not taking: Reported on 06/22/2021) 10 tablet 0   ? furosemide (LASIX) 20 MG tablet Take 1 tablet (20 mg total) by mouth daily for 5 days. 5 tablet 0   ? Misc. Devices (GOJJI WEIGHT SCALE) MISC 1 Device by Does not apply route as needed. (Patient not taking: Reported on 04/22/2021) 1 each 0   ? NIFEdipine (ADALAT CC) 60 MG 24 hr tablet Take 1 tablet (60 mg total) by mouth daily. 60 tablet 2   ? oxyCODONE-acetaminophen (PERCOCET/ROXICET) 5-325 MG  tablet Take 1-2 tablets by mouth every 6 (six) hours as needed. 5 tablet 0   ? oxyCODONE-acetaminophen (PERCOCET/ROXICET) 5-325 MG tablet Take 1-2 tablets by mouth every 6 (six) hours as needed. 5 tablet 0   ? Prenatal Vit-Fe Fumarate-FA (PRENATAL VITAMIN) 27-0.8 MG TABS Take 1 tablet by mouth daily. 30 tablet 13   ? ? ?Review of Systems  ?Constitutional: Negative.  Negative for fatigue and fever.  ?HENT: Negative.    ?Respiratory: Negative.  Negative for shortness of breath.   ?Cardiovascular: Negative.  Negative for chest pain.  ?Gastrointestinal:  Positive for abdominal pain. Negative for constipation, diarrhea, nausea and vomiting.  ?Genitourinary: Negative.  Negative for dysuria, vaginal bleeding and vaginal discharge.  ?Neurological: Negative.  Negative for dizziness and headaches.  ?Physical Exam  ? ?Blood pressure 125/68, pulse 95, temperature 98.6 ?F (37 ?C), temperature source Oral, last menstrual period 10/16/2020, SpO2 99 %, unknown if currently breastfeeding. ? ?Physical Exam ?Vitals and nursing note reviewed.  ?Constitutional:   ?    General: She is not in acute distress. ?   Appearance: She is well-developed.  ?HENT:  ?   Head: Normocephalic.  ?Eyes:  ?   Pupils: Pupils are equal, round, and reactive to light.  ?Cardiovascular:  ?   Rate and Rhythm: Normal rate and regular rhythm.  ?   Heart sounds: Normal heart sounds.  ?Pulmonary:  ?   Effort: Pulmonary effort is normal. No respiratory distress.  ?   Breath sounds: Normal breath sounds.  ?Abdominal:  ?   General: Bowel sounds are normal. There is no distension.  ?   Palpations: Abdomen is soft.  ?   Tenderness: There is generalized abdominal tenderness.  ?Skin: ?   General: Skin is warm and dry.  ?Neurological:  ?   Mental Status: She is alert and oriented to person, place, and time.  ?Psychiatric:     ?   Mood and Affect: Mood normal.     ?   Behavior: Behavior normal.     ?   Thought Content: Thought content normal.     ?   Judgment: Judgment normal.  ? ? ?MAU Course  ?Procedures ?Results for orders placed or performed during the hospital encounter of 06/26/21 (from the past 24 hour(s))  ?CBC with Differential/Platelet     Status: Abnormal  ? Collection Time: 06/26/21  9:54 AM  ?Result Value Ref Range  ? WBC 8.7 4.0 - 10.5 K/uL  ? RBC 3.96 3.87 - 5.11 MIL/uL  ? Hemoglobin 10.0 (L) 12.0 - 15.0 g/dL  ? HCT 31.8 (L) 36.0 - 46.0 %  ? MCV 80.3 80.0 - 100.0 fL  ? MCH 25.3 (L) 26.0 - 34.0 pg  ? MCHC 31.4 30.0 - 36.0 g/dL  ? RDW 13.2 11.5 - 15.5 %  ? Platelets 427 (H) 150 - 400 K/uL  ? nRBC 0.0 0.0 - 0.2 %  ? Neutrophils Relative % 76 %  ? Neutro Abs 6.7 1.7 - 7.7 K/uL  ? Lymphocytes Relative 14 %  ? Lymphs Abs 1.2 0.7 - 4.0 K/uL  ? Monocytes Relative 6 %  ? Monocytes Absolute 0.6 0.1 - 1.0 K/uL  ? Eosinophils Relative 3 %  ? Eosinophils Absolute 0.3 0.0 - 0.5 K/uL  ? Basophils Relative 0 %  ? Basophils Absolute 0.0 0.0 - 0.1 K/uL  ? Immature Granulocytes 1 %  ? Abs Immature Granulocytes 0.04 0.00 - 0.07 K/uL  ?  ?MDM ?CBC with Diff ?Toradol IM ?Percocet  PO ? ?Patient reports complete resolution of  pain ?Low suspicion for any acute processes given stable vital signs, no leukocytosis and a febrile ? ?Assessment and Plan  ? ?1. Postpartum state   ?2. Post-operative pain   ? ?-Discharge home in stable condition ?-Rx for percocet and ibuprofen sent to patient's pharmacy ?-Abdominal pain precautions discussed ?-Patient advised to follow-up with OB as scheduled for postpartum care ?-Patient may return to MAU as needed or if her condition were to change or worsen ? ? ?Wende Mott CNM ?06/26/2021, 9:21 AM  ?

## 2021-06-26 NOTE — MAU Note (Signed)
Pt reports to mau from NICU, 4 days PP c/o incisional pain r/t pp tubal.  Pt reports pain 10/10.  States she has not taken pain meds since waking up yesterday morning.  ?

## 2021-06-30 ENCOUNTER — Telehealth: Payer: Self-pay | Admitting: Emergency Medicine

## 2021-06-30 ENCOUNTER — Institutional Professional Consult (permissible substitution): Payer: Medicaid Other | Admitting: Licensed Clinical Social Worker

## 2021-06-30 ENCOUNTER — Ambulatory Visit: Payer: Medicaid Other

## 2021-06-30 NOTE — Telephone Encounter (Signed)
Attempted TC to patient to check in on BP and mood. Patient has missed PP BP check apt this morning. LM to [facility] to office.  ? ?

## 2021-07-01 LAB — SURGICAL PATHOLOGY

## 2021-07-03 ENCOUNTER — Telehealth (HOSPITAL_COMMUNITY): Payer: Self-pay

## 2021-07-03 NOTE — Telephone Encounter (Signed)
No answer. No voicemail option.  ? Heron Nay ?07/03/2021,1007 ?

## 2021-08-04 ENCOUNTER — Ambulatory Visit: Payer: Medicaid Other | Admitting: Obstetrics & Gynecology

## 2021-08-19 IMAGING — US US ART/VEN ABD/PELV/SCROTUM DOPPLER LTD
1 series · 13 of 25 positions shown · non-contrast
Comparison: None.

CLINICAL DATA: Right lower quadrant abdominal pain

EXAM:
TRANSABDOMINAL AND TRANSVAGINAL ULTRASOUND OF PELVIS
DOPPLER ULTRASOUND OF OVARIES
TECHNIQUE: Both transabdominal ultrasound examinations of the pelvis were
performed. Transabdominal technique was performed for global imaging
of the pelvis including uterus, ovaries, adnexal regions, and pelvic
cul-de-sac.
. Color and duplex Doppler ultrasound was utilized to evaluate blood
flow to the ovaries.

[Series 1: us art/ven abd/pelv/scrotum doppler ltd · 13 of 50 slices shown]
[im 1/50]
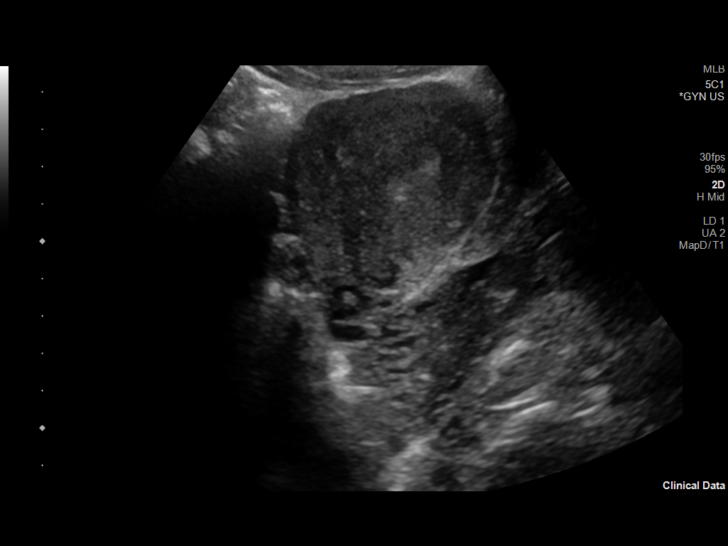
[im 5/50]
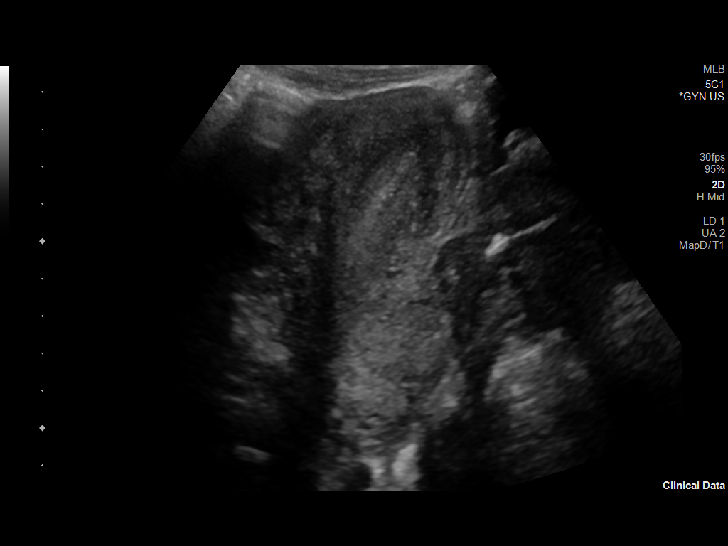
[im 9/50]
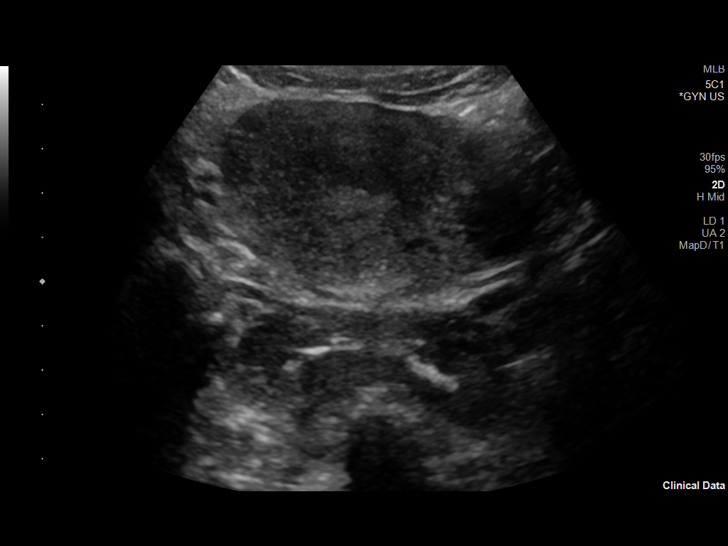
[im 13/50]
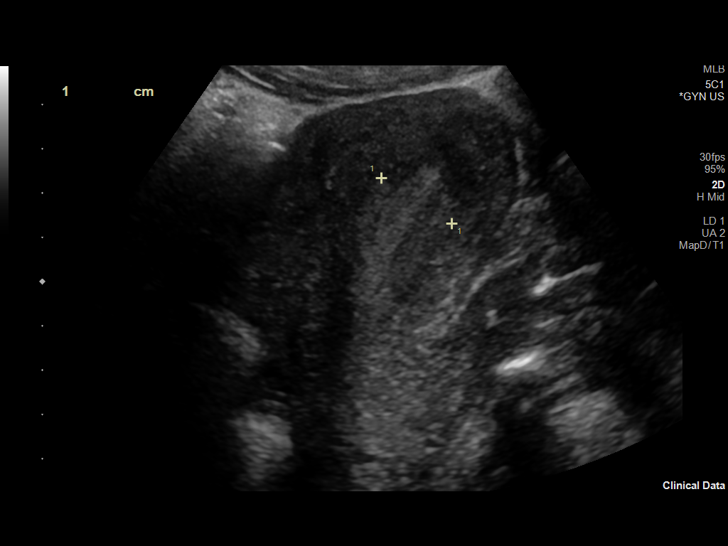
[im 17/50]
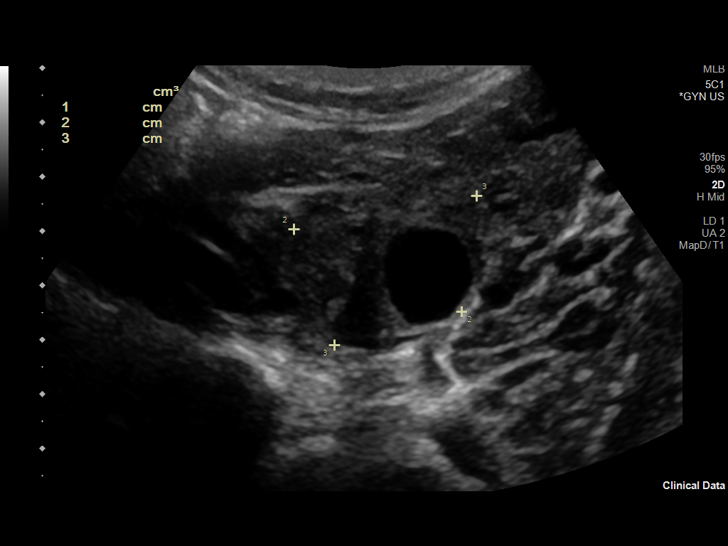
[im 21/50]
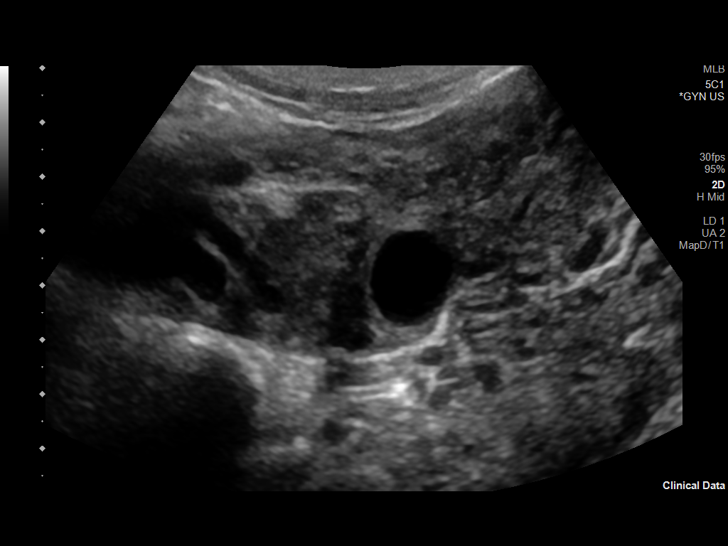
[im 25/50]
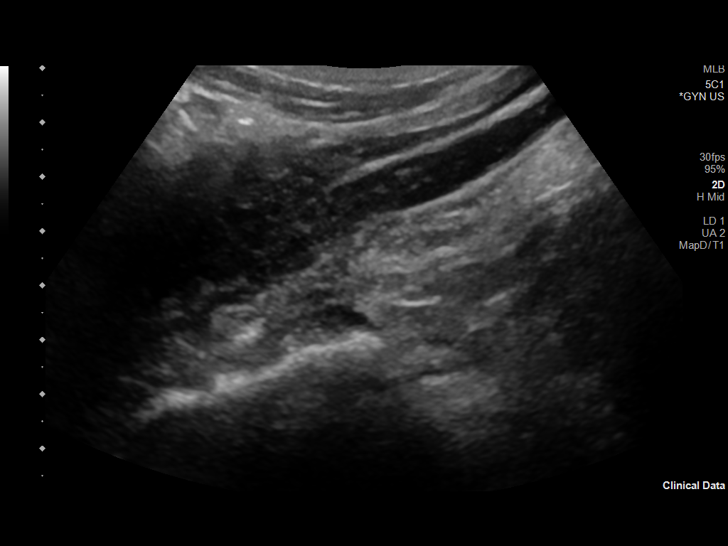
[im 29/50]
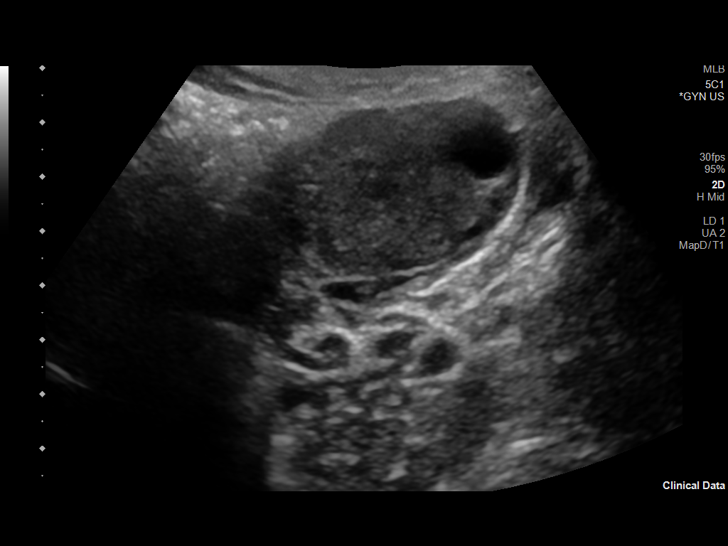
[im 33/50]
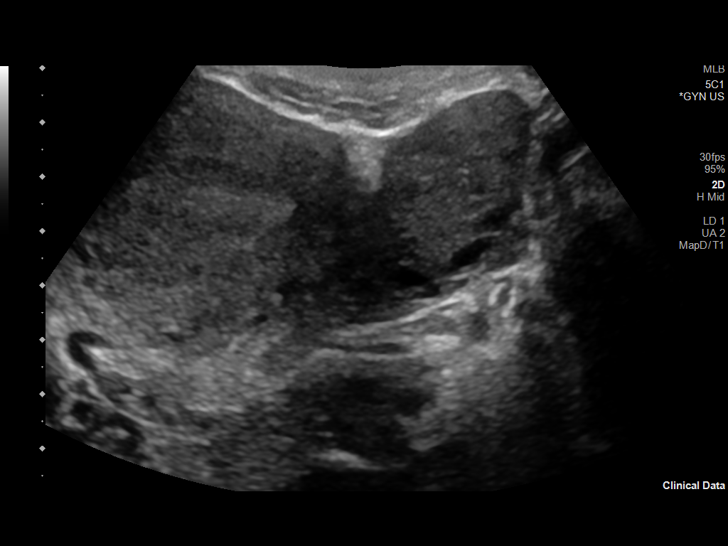
[im 37/50]
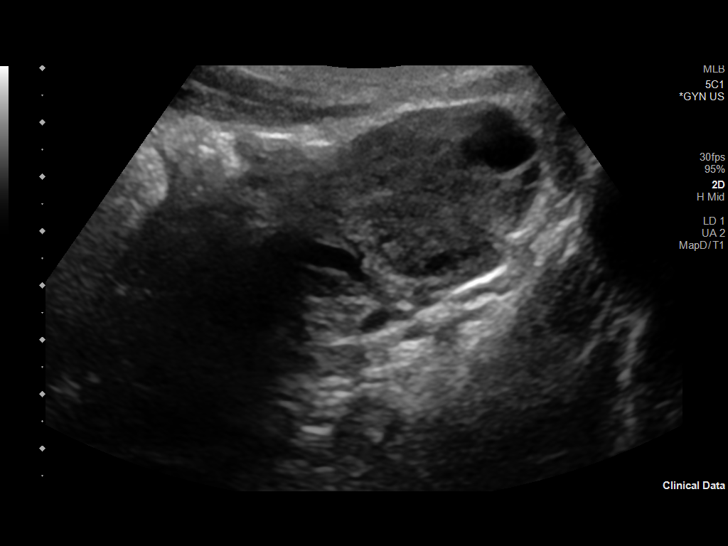
[im 41/50]
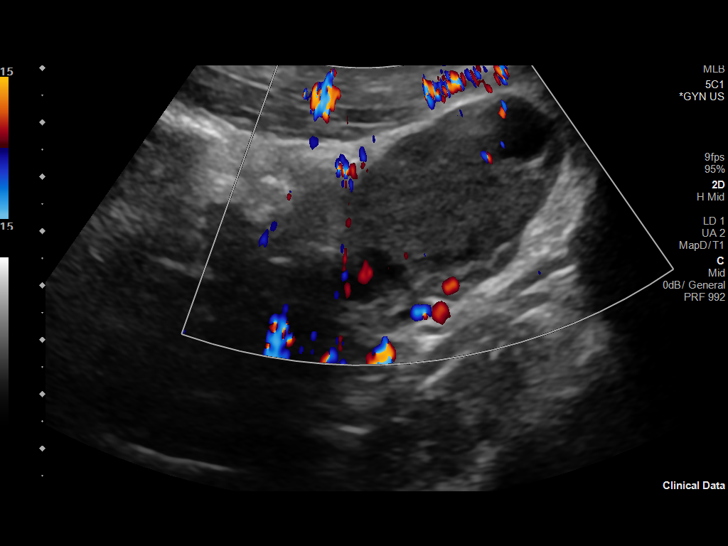
[im 45/50]
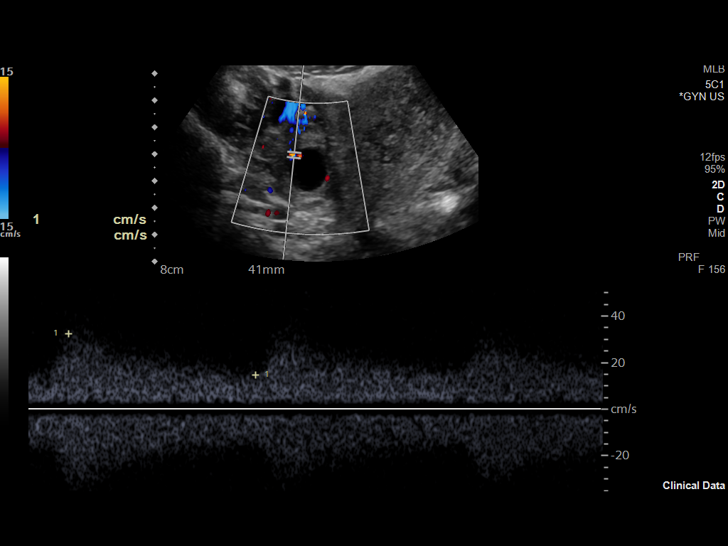
[im 50/50]
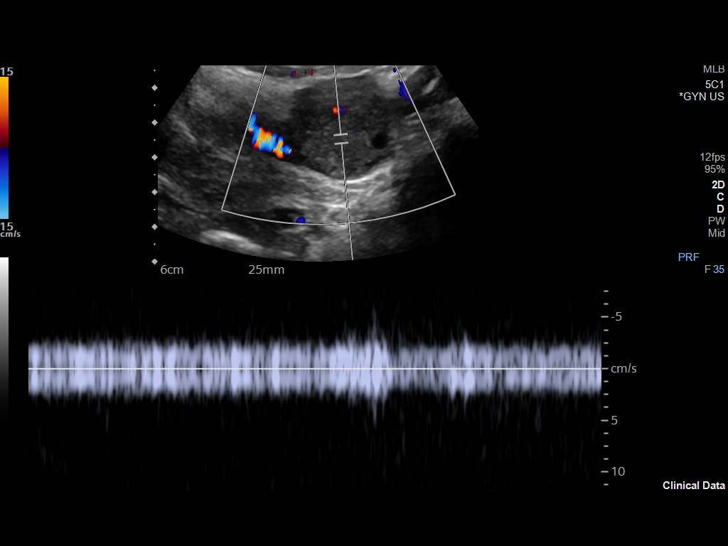

[13 of 25 positions shown; findings below may reference images not displayed]

FINDINGS: Uterus

Measurements: 9.4 x 5.1 x 7.0 = volume: 172 mL. No fibroids or other
mass visualized.

Endometrium

Thickness: Diffusely thickened measuring 19 mm. No focal
abnormality visualized.

Right ovary

Measurements: 2.8 x 3.4 x 3.8 = volume: 19.4 mL. Anechoic follicles
are seen with the largest measuring 1.4 x 1.9 x 1.6 cm.

Left ovary

Measurements: 2.8 x 4.0 x 3.0 = volume: 17.3 mL. Anechoic follicles
are seen the largest measuring 1.4 x 1.2 x 1.1 cm

Pulsed Doppler evaluation of both ovaries demonstrates normal
low-resistance arterial and venous waveforms.

Other findings

No abnormal free fluid.
IMPRESSION: 1. Thickened endometrium measuring 19 mm. Please correlate with the
patient's menstrual cycle. Consider follow-up by US in 6-8 weeks,
during the week immediately following menses (exam timing is
critical).
2. Normal appearing ovaries.

## 2021-08-19 IMAGING — US US PELVIS COMPLETE
1 series · 13 of 25 positions shown · non-contrast
Comparison: None.

CLINICAL DATA: Right lower quadrant abdominal pain

EXAM:
TRANSABDOMINAL AND TRANSVAGINAL ULTRASOUND OF PELVIS
DOPPLER ULTRASOUND OF OVARIES
TECHNIQUE: Both transabdominal ultrasound examinations of the pelvis were
performed. Transabdominal technique was performed for global imaging
of the pelvis including uterus, ovaries, adnexal regions, and pelvic
cul-de-sac.
. Color and duplex Doppler ultrasound was utilized to evaluate blood
flow to the ovaries.

[Series 1: us pelvis complete · 13 of 50 slices shown]
[im 1/50]
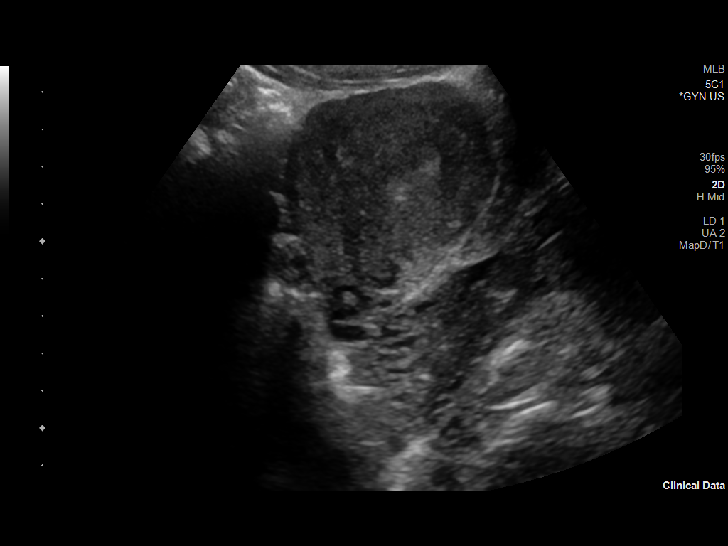
[im 5/50]
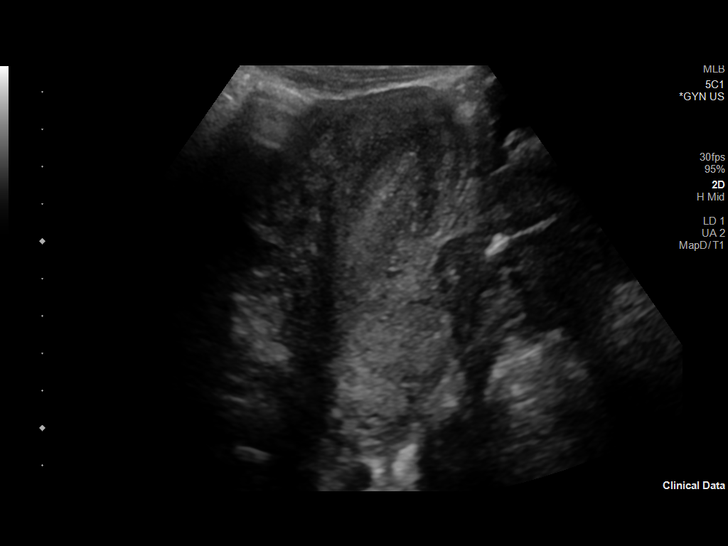
[im 9/50]
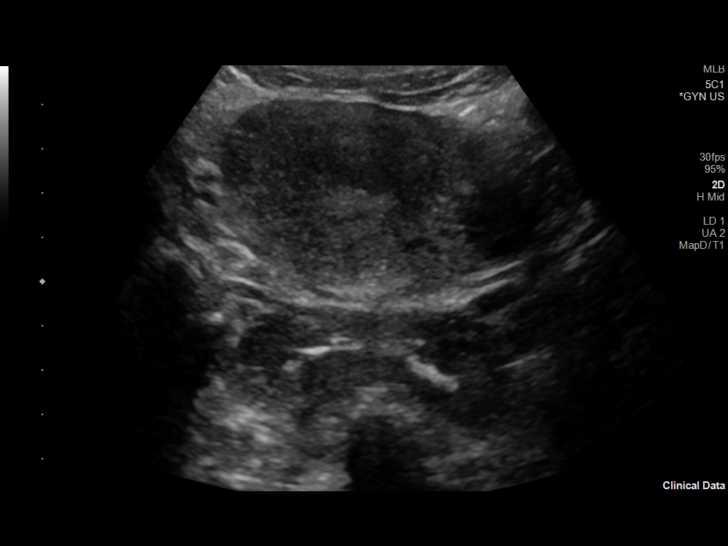
[im 13/50]
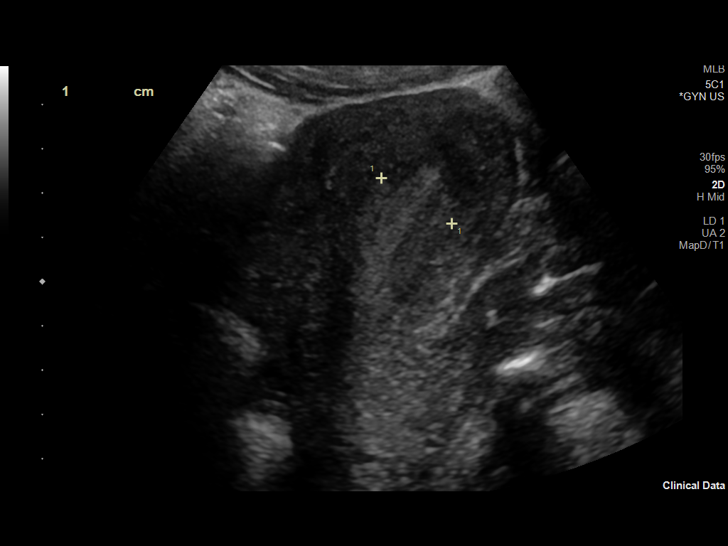
[im 17/50]
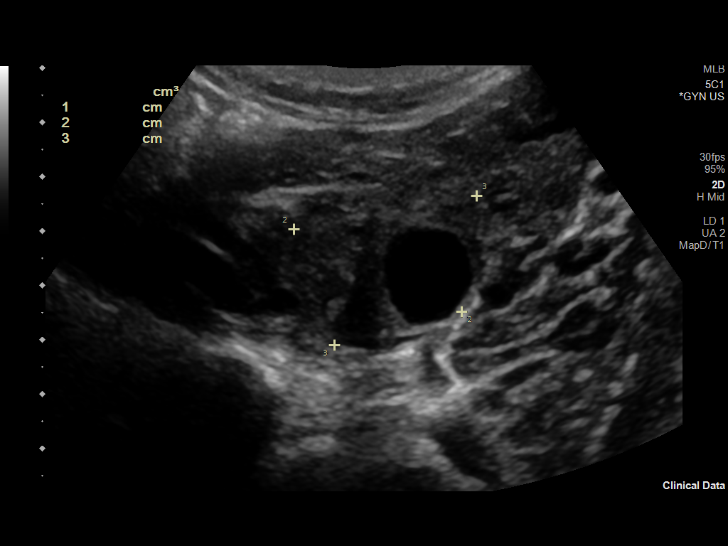
[im 21/50]
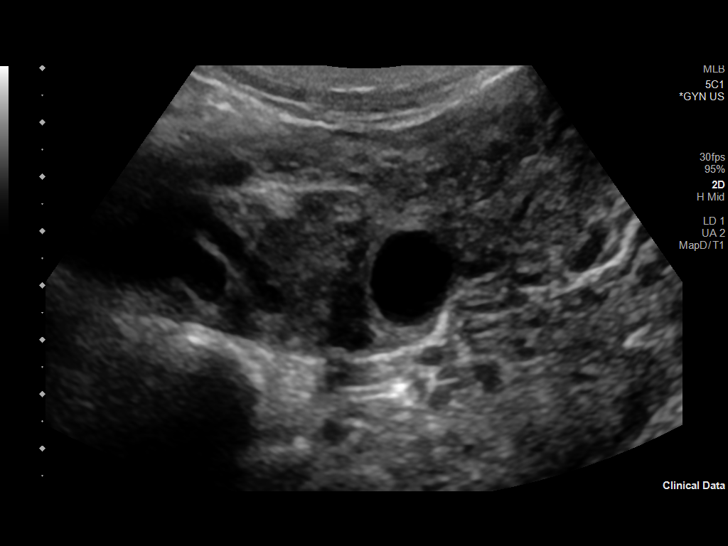
[im 25/50]
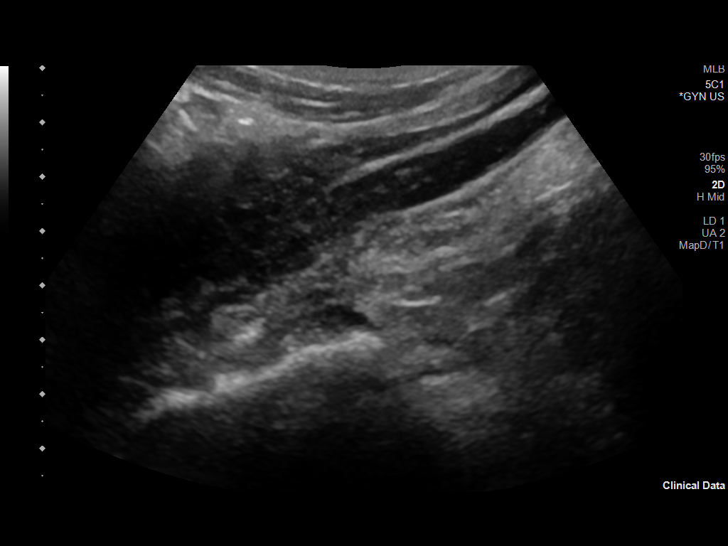
[im 29/50]
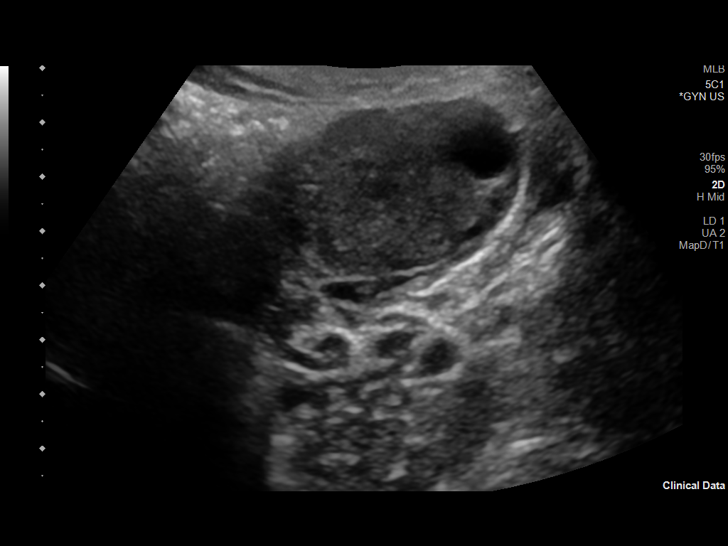
[im 33/50]
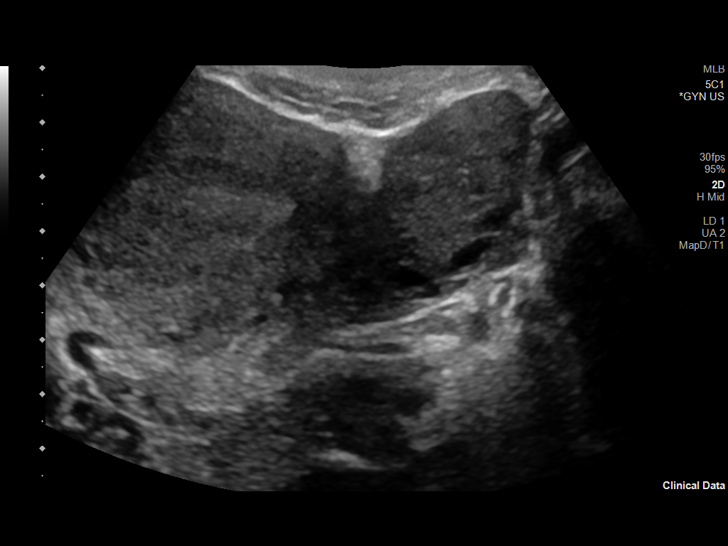
[im 37/50]
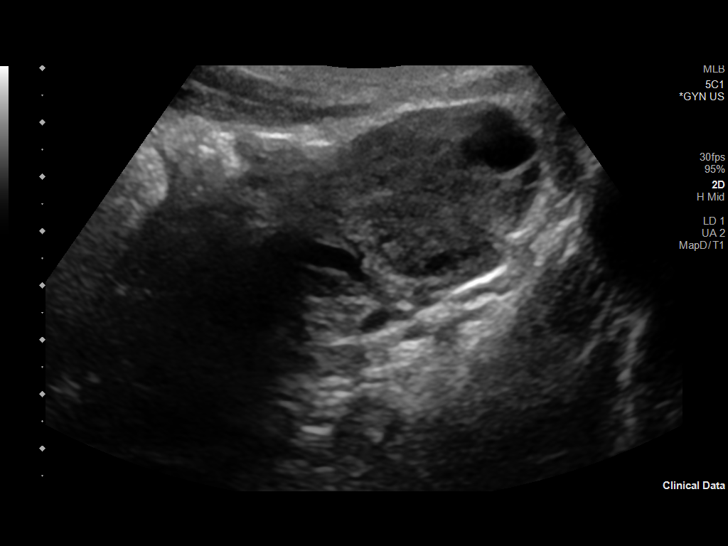
[im 41/50]
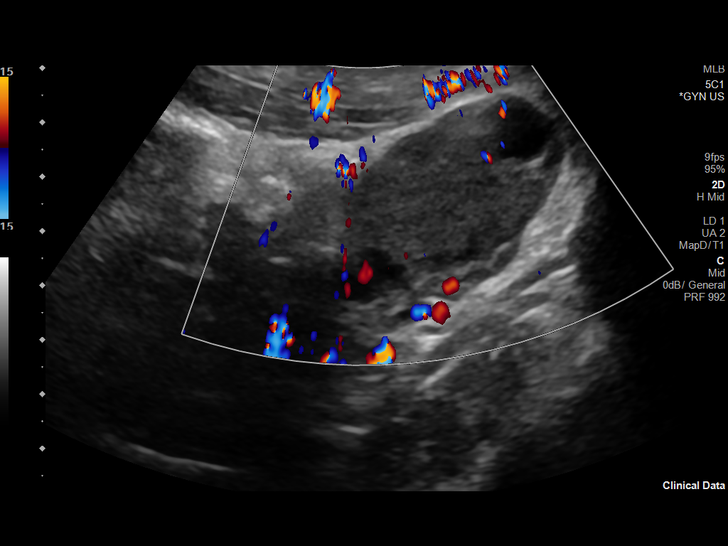
[im 45/50]
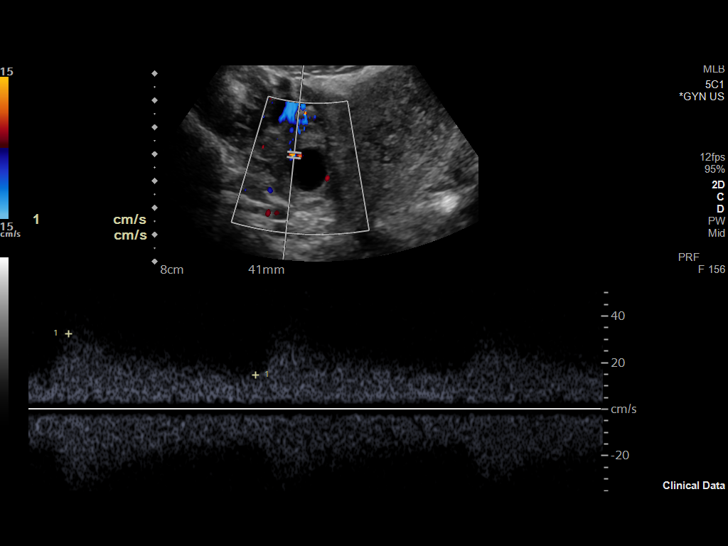
[im 50/50]
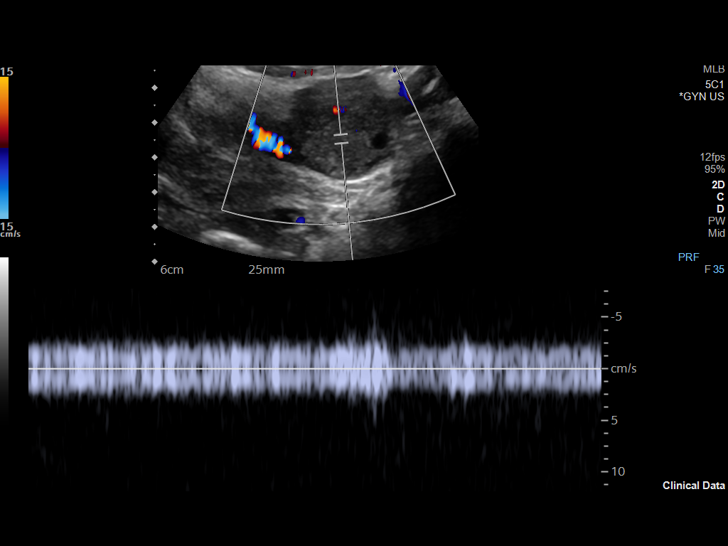

[13 of 25 positions shown; findings below may reference images not displayed]

FINDINGS: Uterus

Measurements: 9.4 x 5.1 x 7.0 = volume: 172 mL. No fibroids or other
mass visualized.

Endometrium

Thickness: Diffusely thickened measuring 19 mm. No focal
abnormality visualized.

Right ovary

Measurements: 2.8 x 3.4 x 3.8 = volume: 19.4 mL. Anechoic follicles
are seen with the largest measuring 1.4 x 1.9 x 1.6 cm.

Left ovary

Measurements: 2.8 x 4.0 x 3.0 = volume: 17.3 mL. Anechoic follicles
are seen the largest measuring 1.4 x 1.2 x 1.1 cm

Pulsed Doppler evaluation of both ovaries demonstrates normal
low-resistance arterial and venous waveforms.

Other findings

No abnormal free fluid.
IMPRESSION: 1. Thickened endometrium measuring 19 mm. Please correlate with the
patient's menstrual cycle. Consider follow-up by US in 6-8 weeks,
during the week immediately following menses (exam timing is
critical).
2. Normal appearing ovaries.

## 2021-11-05 ENCOUNTER — Ambulatory Visit (HOSPITAL_COMMUNITY)
Admission: EM | Admit: 2021-11-05 | Discharge: 2021-11-05 | Disposition: A | Discharge status: Home / Self Care | Payer: Medicaid Other | Attending: Family Medicine | Admitting: Family Medicine

## 2021-11-05 ENCOUNTER — Encounter (HOSPITAL_COMMUNITY): Payer: Self-pay

## 2021-11-05 DIAGNOSIS — N760 Acute vaginitis: Secondary | ICD-10-CM | POA: Insufficient documentation

## 2021-11-05 LAB — POCT URINALYSIS DIPSTICK, ED / UC
Bilirubin Urine: NEGATIVE
Glucose, UA: NEGATIVE mg/dL
Ketones, ur: NEGATIVE mg/dL
Nitrite: NEGATIVE
Protein, ur: NEGATIVE mg/dL
Specific Gravity, Urine: 1.025 (ref 1.005–1.030)
Urobilinogen, UA: 1 mg/dL (ref 0.0–1.0)
pH: 6 (ref 5.0–8.0)

## 2021-11-05 LAB — POC URINE PREG, ED: Preg Test, Ur: NEGATIVE

## 2021-11-05 MED ORDER — FLUCONAZOLE 150 MG PO TABS: 150.0000 mg | ORAL_TABLET | ORAL | 0 refills | Status: AC

## 2021-11-05 MED ORDER — METRONIDAZOLE 500 MG PO TABS: 500.0000 mg | ORAL_TABLET | Freq: Two times a day (BID) | ORAL | 0 refills | Status: AC

## 2021-11-05 NOTE — Discharge Instructions (Addendum)
The urinalysis showed just a trace of blood and white blood cells.  Urine pregnancy test was negative  Staff will call you with any positives on the swab  Take metronidazole 500 mg--1 tablet 2 times daily.  This is for possible bacterial vaginosis.  Do not drink alcohol within 72 hours of taking this medication  Take fluconazole 150 mg--1 tab orally and repeat in 3 to 4 days.

## 2021-11-05 NOTE — ED Triage Notes (Signed)
Pt requesting STD testing. States having thick white vaginal d/c with odor x2 days. States has bladder pressure but no urinary sx's.
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
## 2021-11-08 LAB — CERVICOVAGINAL ANCILLARY ONLY
Bacterial Vaginitis (gardnerella): POSITIVE — AB
Candida Glabrata: NEGATIVE
Candida Vaginitis: NEGATIVE
Chlamydia: NEGATIVE
Comment: NEGATIVE
Comment: NEGATIVE
Comment: NEGATIVE
Comment: NEGATIVE
Comment: NEGATIVE
Comment: NORMAL
Neisseria Gonorrhea: NEGATIVE
Trichomonas: NEGATIVE

## 2022-01-04 ENCOUNTER — Ambulatory Visit (HOSPITAL_COMMUNITY)
Admission: EM | Admit: 2022-01-04 | Discharge: 2022-01-04 | Disposition: A | Discharge status: Home / Self Care | Payer: Medicaid Other | Attending: Family Medicine | Admitting: Family Medicine

## 2022-01-04 ENCOUNTER — Encounter (HOSPITAL_COMMUNITY): Payer: Self-pay | Admitting: Emergency Medicine

## 2022-01-04 DIAGNOSIS — Z711 Person with feared health complaint in whom no diagnosis is made: Secondary | ICD-10-CM | POA: Insufficient documentation

## 2022-01-04 DIAGNOSIS — N898 Other specified noninflammatory disorders of vagina: Secondary | ICD-10-CM

## 2022-01-04 DIAGNOSIS — Z202 Contact with and (suspected) exposure to infections with a predominantly sexual mode of transmission: Secondary | ICD-10-CM | POA: Diagnosis not present

## 2022-01-04 DIAGNOSIS — I10 Essential (primary) hypertension: Secondary | ICD-10-CM

## 2022-01-04 LAB — HIV ANTIBODY (ROUTINE TESTING W REFLEX): HIV Screen 4th Generation wRfx: NONREACTIVE

## 2022-01-04 MED ORDER — DOXYCYCLINE HYCLATE 100 MG PO CAPS: 100.0000 mg | ORAL_CAPSULE | Freq: Two times a day (BID) | ORAL | 0 refills | Status: DC

## 2022-01-04 MED ORDER — CEFTRIAXONE SODIUM 500 MG IJ SOLR
500.0000 mg | Freq: Once | INTRAMUSCULAR | Status: AC
  Administered 2022-01-04: 500 mg via INTRAMUSCULAR

## 2022-01-04 MED ORDER — LIDOCAINE HCL (PF) 1 % IJ SOLN
INTRAMUSCULAR | Status: AC
  Filled 2022-01-04: qty 2

## 2022-01-04 MED ORDER — CEFTRIAXONE SODIUM 500 MG IJ SOLR
INTRAMUSCULAR | Status: AC
  Filled 2022-01-04: qty 500

## 2022-01-04 NOTE — ED Provider Notes (Signed)
[facility]   [id number] 01/04/22 Arrival Time: 6195  ASSESSMENT & PLAN:  1. Concern about STD in female without diagnosis   2. Vaginal discharge   3. Vaginal lesion   4. Elevated blood pressure reading in office with diagnosis of hypertension       Discharge Instructions      You have been given the following today for treatment of suspected gonorrhea and/or chlamydia:  cefTRIAXone (ROCEPHIN) injection 500 mg  Please pick up your prescription for doxycycline 100 mg and begin taking twice daily for the next seven (7) days.  Even though we have treated you today, we have sent testing for sexually transmitted infections. We will notify you of any positive results once they are received. If required, we will prescribe any medications you might need.  Please refrain from all sexual activity for at least the next seven days.  Your blood pressure was noted to be elevated during your visit today. If you are currently taking medication for high blood pressure, please ensure you are taking this as directed. If you do not have a history of high blood pressure and your blood pressure remains persistently elevated, you may need to begin taking a medication at some point. You may return here within the next few days to recheck if unable to see your primary care provider or if you do not have a one.  BP (!) 159/101 (BP Location: Right Arm)   Pulse 62   Temp 98.4 F (36.9 C) (Oral)   Resp 16   LMP 12/19/2021 (Approximate)   SpO2 98%   BP Readings from Last 3 Encounters:  01/04/22 (!) 159/101  11/05/21 (!) 166/99  06/26/21 125/68       Without s/s of PID.  Pending  HSV CULTURE AND TYPING  RPR  HIV ANTIBODY (ROUTINE TESTING W REFLEX)  CERVICOVAGINAL ANCILLARY ONLY   Will notify of any positive results. Instructed to refrain from sexual activity for at least seven days.   Follow-up Information      Urgent Care at [facility].   Specialty: Urgent  Care Why: To recheck your blood pressure within one week. Contact information: [place] [address] [phone]               Reviewed expectations re: course of current medical issues. Questions answered. Outlined signs and symptoms indicating need for more acute intervention. Patient verbalized understanding. After Visit Summary given.   SUBJECTIVE:  Sharon Johnston is a 31 y.o. female who reports a possible exposure to gonorrhea and HSV. States she has a sore inside the vagina x 2 day. States the sore resembles a scratch. Also reports yellowish-green vaginal discharge x 3 days.  Also endorses tailbone pain. States sitting and walking increases the pain. States she googled this and was advised this .  Afebrile. No abdominal or pelvic pain. Normal PO intake wihout n/v. No genital rashes or lesions. No tx PTA.  Patient's last menstrual period was 12/19/2021 (approximate).  Increased blood pressure noted today. Reports that she is treated for HTN. She reports taking medications as instructed, no chest pain on exertion, no dyspnea on exertion, no swelling of ankles, no orthostatic dizziness or lightheadedness, no orthopnea or paroxysmal nocturnal dyspnea, and no palpitations.  OBJECTIVE:  Vitals:   01/04/22 1126  BP: (!) 159/101  Pulse: 62  Resp: 16  Temp: 98.4 F (36.9 C)  TempSrc: Oral  SpO2: 98%    General appearance: alert, cooperative, appears  stated age and no distress Lungs: unlabored respirations; speaks full sentences without difficulty Back: no CVA tenderness; FROM at waist Abdomen: soft, non-tender GU: normal appearing; slight irritation measuring a few mm at clitoris; no obvious ulcerations; area is TTP Skin: warm and dry Psychological: alert and cooperative; normal mood and affect.  Labs Reviewed  HSV CULTURE AND TYPING  CERVICOVAGINAL ANCILLARY ONLY    No Known Allergies  Past Medical History:  Diagnosis Date    Chlamydia    Depression    hospitalized for 2 weeks after suicide att was post partum   Hx of gonorrhea    Hypertension    gestational HTN   Kidney infection    Family History  Problem Relation Age of Onset   Hypertension Mother    Hypertension Maternal Grandmother    Healthy Father    Hypertension Maternal Aunt    Depression Cousin    Social History   Socioeconomic History   Marital status: Single    Spouse name: Not on file   Number of children: 4   Years of education: diploma   Highest education level: Not on file  Occupational History   Not on file  Tobacco Use   Smoking status: Some Days    Types: Cigars   Smokeless tobacco: Never   Tobacco comments:    two black and milds per day  Vaping Use   Vaping Use: Never used  Substance and Sexual Activity   Alcohol use: No   Drug use: No   Sexual activity: Not Currently    Birth control/protection: None  Other Topics Concern   Not on file  Social History Narrative   ** Merged History Encounter **       Social Determinants of Health   Financial Resource Strain: Not on file  Food Insecurity: Not on file  Transportation Needs: Not on file  Physical Activity: Not on file  Stress: Not on file  Social Connections: Not on file  Intimate Partner Violence: Not on file           [place], MD 01/04/22 1304

## 2022-01-04 NOTE — ED Triage Notes (Signed)
Pt reports a possible exposure to gonorrhea and HSV. States she has a sore inside the vagina x 2 day. States the sore resembles a scratch. Also reports yellowish-green vaginal discharge x 3 days.  Also endorses tailbone pain. States sitting and walking increases the pain. States she googled this and was advised this could be a sign of HSV.

## 2022-01-04 NOTE — Discharge Instructions (Addendum)
You have been given the following today for treatment of suspected gonorrhea and/or chlamydia:  cefTRIAXone (ROCEPHIN) injection 500 mg  Please pick up your prescription for doxycycline 100 mg and begin taking twice daily for the next seven (7) days.  Even though we have treated you today, we have sent testing for sexually transmitted infections including HIV/syphilis and a culture for herpes. We will notify you of any positive results once they are received. If required, we will prescribe any medications you might need.  Please refrain from all sexual activity for at least the next seven days. --------  Your blood pressure was noted to be elevated during your visit today. If you are currently taking medication for high blood pressure, please ensure you are taking this as directed. If you do not have a history of high blood pressure and your blood pressure remains persistently elevated, you may need to begin taking a medication at some point. You may return here within the next few days to recheck if unable to see your primary care provider or if you do not have a one.  BP (!) 159/101 (BP Location: Right Arm)   Pulse 62   Temp 98.4 F (36.9 C) (Oral)   Resp 16   LMP 12/19/2021 (Approximate)   SpO2 98%   BP Readings from Last 3 Encounters:  01/04/22 (!) 159/101  11/05/21 (!) 166/99  06/26/21 125/68

## 2022-01-05 LAB — CERVICOVAGINAL ANCILLARY ONLY
Bacterial Vaginitis (gardnerella): POSITIVE — AB
Candida Glabrata: NEGATIVE
Candida Vaginitis: NEGATIVE
Chlamydia: NEGATIVE
Comment: NEGATIVE
Comment: NEGATIVE
Comment: NEGATIVE
Comment: NEGATIVE
Comment: NEGATIVE
Comment: NORMAL
Neisseria Gonorrhea: NEGATIVE
Trichomonas: NEGATIVE

## 2022-01-05 LAB — RPR: RPR Ser Ql: NONREACTIVE

## 2022-01-06 ENCOUNTER — Telehealth (HOSPITAL_COMMUNITY): Payer: Self-pay | Admitting: Emergency Medicine

## 2022-01-06 LAB — HSV CULTURE AND TYPING

## 2022-01-06 MED ORDER — METRONIDAZOLE 500 MG PO TABS: 500.0000 mg | ORAL_TABLET | Freq: Two times a day (BID) | ORAL | 0 refills | Status: DC

## 2022-01-19 ENCOUNTER — Ambulatory Visit: Payer: Medicaid Other | Admitting: Obstetrics

## 2022-01-24 ENCOUNTER — Other Ambulatory Visit: Payer: Self-pay

## 2022-01-25 ENCOUNTER — Other Ambulatory Visit: Payer: Self-pay

## 2022-02-11 ENCOUNTER — Other Ambulatory Visit: Payer: Self-pay

## 2022-03-08 ENCOUNTER — Ambulatory Visit: Payer: Medicaid Other | Admitting: Obstetrics

## 2022-04-10 ENCOUNTER — Inpatient Hospital Stay (HOSPITAL_COMMUNITY)
Admission: AD | Admit: 2022-04-10 | Discharge: 2022-04-10 | Disposition: A | Discharge status: Home / Self Care | Payer: Medicaid Other | Attending: Family Medicine | Admitting: Family Medicine

## 2022-04-14 ENCOUNTER — Other Ambulatory Visit (HOSPITAL_COMMUNITY): Payer: Self-pay

## 2022-04-27 ENCOUNTER — Other Ambulatory Visit (HOSPITAL_COMMUNITY): Payer: Self-pay

## 2022-05-18 ENCOUNTER — Ambulatory Visit: Payer: Medicaid Other | Admitting: Obstetrics

## 2022-05-25 ENCOUNTER — Ambulatory Visit: Payer: Medicaid Other | Admitting: Obstetrics

## 2022-05-26 ENCOUNTER — Ambulatory Visit: Payer: Medicaid Other | Admitting: Obstetrics

## 2022-05-29 ENCOUNTER — Other Ambulatory Visit: Payer: Self-pay

## 2022-05-29 ENCOUNTER — Emergency Department (HOSPITAL_COMMUNITY): Payer: Medicaid Other

## 2022-05-29 ENCOUNTER — Emergency Department (HOSPITAL_COMMUNITY)
Admission: EM | Admit: 2022-05-29 | Discharge: 2022-05-29 | Disposition: A | Discharge status: Home / Self Care | Payer: Medicaid Other | Attending: Emergency Medicine | Admitting: Emergency Medicine

## 2022-05-29 DIAGNOSIS — M25521 Pain in right elbow: Secondary | ICD-10-CM | POA: Diagnosis not present

## 2022-05-29 DIAGNOSIS — Y92410 Unspecified street and highway as the place of occurrence of the external cause: Secondary | ICD-10-CM | POA: Insufficient documentation

## 2022-05-29 DIAGNOSIS — M25562 Pain in left knee: Secondary | ICD-10-CM | POA: Diagnosis present

## 2022-05-29 MED ORDER — IBUPROFEN 400 MG PO TABS
600.0000 mg | ORAL_TABLET | Freq: Once | ORAL | Status: AC
Start: 2022-05-29 — End: 2022-05-29
  Administered 2022-05-29: 600 mg via ORAL
  Filled 2022-05-29: qty 1
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
## 2022-05-29 NOTE — Discharge Instructions (Signed)
You were evaluated in the emergency department, your x-rays were negative for fracture.  Please follow-up with your PCP in 1 week for reevaluation, please continue Motrin and Tylenol at home for pain.  You may use the Ace wrap and sling for comfort.  Return to the ED if you experience any chest pain, abdominal pain, severe headaches or any other emergent concerns.
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
## 2022-06-28 ENCOUNTER — Other Ambulatory Visit (INDEPENDENT_AMBULATORY_CARE_PROVIDER_SITE_OTHER): Payer: Self-pay

## 2022-06-28 ENCOUNTER — Ambulatory Visit (HOSPITAL_COMMUNITY)
Admission: RE | Admit: 2022-06-28 | Discharge: 2022-06-28 | Disposition: A | Discharge status: Home / Self Care | Payer: Medicaid Other | Source: Ambulatory Visit | Attending: Emergency Medicine | Admitting: Emergency Medicine

## 2022-06-28 ENCOUNTER — Encounter (HOSPITAL_COMMUNITY): Payer: Self-pay

## 2022-06-28 ENCOUNTER — Other Ambulatory Visit: Payer: Self-pay

## 2022-06-28 VITALS — BP 184/107 | HR 72 | Temp 98.4°F | Resp 18

## 2022-06-28 DIAGNOSIS — N898 Other specified noninflammatory disorders of vagina: Secondary | ICD-10-CM | POA: Diagnosis present

## 2022-06-28 MED ORDER — METRONIDAZOLE 500 MG PO TABS: 500.0000 mg | ORAL_TABLET | Freq: Two times a day (BID) | ORAL | 0 refills | Status: DC

## 2022-06-28 NOTE — ED Triage Notes (Signed)
Pt states she has vaginal discharge and lower abdominal pain x 2 days. She states it feels like she has a sore inside her vagina as well.

## 2022-06-28 NOTE — Discharge Instructions (Signed)
Today you are being treated prophylactically for  Bacterial vaginosis   On exam there was no abnormalities to your vaginal canal, cervix or labia, there are no lesions noted on exam, we are unable to test you for herpes as there is no lesion on exam and we do not complete blood work for herpes  Take Metronidazole 500 mg twice a day for 7 days, do not drink alcohol while using medication, this will make you feel sick   Bacterial vaginosis which results from an overgrowth of one on several organisms that are normally present in your vagina. Vaginosis is an inflammation of the vagina that can result in discharge, itching and pain.  Labs pending 2-3 days, you will be contacted if positive for any sti and treatment will be sent to the pharmacy, you will have to return to the clinic if positive for gonorrhea to receive treatment   Please refrain from having sex until labs results, if positive please refrain from having sex until treatment complete and symptoms resolve   If positive for Chlamydia  gonorrhea or trichomoniasis please notify partner or partners so they may tested as well  Moving forward, it is recommended you use some form of protection against the transmission of sti infections  such as condoms or dental dams with each sexual encounter     In addition: Avoid baths, hot tubs and whirlpool spas.  Don't use scented or harsh soaps Avoid irritants. These include scented tampons and pads. Wipe from front to back after using the toilet. Don't douche. Your vagina doesn't require cleansing other than normal bathing.  Use a condom.  Wear cotton underwear, this fabric absorbs some moisture.
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
## 2022-06-29 ENCOUNTER — Telehealth: Payer: Self-pay | Admitting: Emergency Medicine

## 2022-06-29 LAB — CERVICOVAGINAL ANCILLARY ONLY
Bacterial Vaginitis (gardnerella): POSITIVE — AB
Candida Glabrata: NEGATIVE
Candida Vaginitis: NEGATIVE
Chlamydia: NEGATIVE
Comment: NEGATIVE
Comment: NEGATIVE
Comment: NEGATIVE
Comment: NEGATIVE
Comment: NEGATIVE
Comment: NORMAL
Neisseria Gonorrhea: NEGATIVE
Trichomonas: NEGATIVE

## 2022-08-17 DIAGNOSIS — Z1231 Encounter for screening mammogram for malignant neoplasm of breast: Secondary | ICD-10-CM

## 2022-08-30 ENCOUNTER — Other Ambulatory Visit: Payer: Self-pay | Admitting: Obstetrics and Gynecology

## 2022-08-30 DIAGNOSIS — Z3480 Encounter for supervision of other normal pregnancy, unspecified trimester: Secondary | ICD-10-CM

## 2022-08-30 MED ORDER — BLOOD PRESSURE KIT DEVI: 1.0000 | 0 refills | Status: AC | PRN

## 2022-09-05 ENCOUNTER — Ambulatory Visit (HOSPITAL_COMMUNITY)
Admission: RE | Admit: 2022-09-05 | Discharge: 2022-09-05 | Disposition: A | Discharge status: Home / Self Care | Payer: Medicaid Other | Source: Ambulatory Visit | Attending: Family Medicine | Admitting: Family Medicine

## 2022-09-05 ENCOUNTER — Encounter (HOSPITAL_COMMUNITY): Payer: Self-pay

## 2022-09-05 ENCOUNTER — Encounter: Payer: Self-pay | Admitting: Obstetrics & Gynecology

## 2022-09-05 VITALS — BP 158/117 | HR 90 | Temp 98.1°F | Resp 16 | Ht 62.0 in | Wt 120.0 lb

## 2022-09-05 DIAGNOSIS — L309 Dermatitis, unspecified: Secondary | ICD-10-CM

## 2022-09-05 DIAGNOSIS — Z113 Encounter for screening for infections with a predominantly sexual mode of transmission: Secondary | ICD-10-CM | POA: Diagnosis present

## 2022-09-05 DIAGNOSIS — L739 Follicular disorder, unspecified: Secondary | ICD-10-CM | POA: Diagnosis present

## 2022-09-05 MED ORDER — DOXYCYCLINE HYCLATE 100 MG PO CAPS: 100.0000 mg | ORAL_CAPSULE | Freq: Two times a day (BID) | ORAL | 0 refills | Status: DC

## 2022-09-05 MED ORDER — TRIAMCINOLONE ACETONIDE 0.1 % EX CREA: 1.0000 | TOPICAL_CREAM | Freq: Two times a day (BID) | CUTANEOUS | 1 refills | Status: DC

## 2022-09-05 NOTE — Discharge Instructions (Signed)
We have sent testing for sexually transmitted infections. We will notify you of any positive results once they are received. If required, we will prescribe any medications you might need.  Please refrain from all sexual activity for at least the next seven days.  

## 2022-09-05 NOTE — ED Triage Notes (Signed)
Patient here today with c/o vaginal discharge X 3 days and a sore in her left side of her mouth X 1 week. She would like to be tested for all STDs.   Patient is also here c/o rash on right underarm, neck, and left leg. She states that it is eczema and would like to get a cream for it.

## 2022-09-06 LAB — CERVICOVAGINAL ANCILLARY ONLY
Bacterial Vaginitis (gardnerella): POSITIVE — AB
Candida Glabrata: NEGATIVE
Candida Vaginitis: POSITIVE — AB
Chlamydia: NEGATIVE
Comment: NEGATIVE
Comment: NEGATIVE
Comment: NEGATIVE
Comment: NEGATIVE
Comment: NEGATIVE
Comment: NORMAL
Neisseria Gonorrhea: NEGATIVE
Trichomonas: NEGATIVE

## 2022-09-07 ENCOUNTER — Telehealth (HOSPITAL_COMMUNITY): Payer: Self-pay | Admitting: Emergency Medicine

## 2022-09-07 MED ORDER — METRONIDAZOLE 500 MG PO TABS: 500.0000 mg | ORAL_TABLET | Freq: Two times a day (BID) | ORAL | 0 refills | Status: DC

## 2022-09-07 MED ORDER — FLUCONAZOLE 150 MG PO TABS: 150.0000 mg | ORAL_TABLET | Freq: Once | ORAL | 0 refills | Status: AC

## 2022-09-07 NOTE — ED Provider Notes (Signed)
[facility]   [id number] 09/05/22 Arrival Time: 1332  ASSESSMENT & PLAN:  1. Screening for STDs (sexually transmitted diseases)   2. Eczema, unspecified type   3. Folliculitis    No empiric treatment regarding STI screening; prefers to await results.  For folliculitis and eczema, begin: Meds ordered this encounter  Medications   doxycycline (VIBRAMYCIN) 100 MG capsule    Sig: Take 1 capsule (100 mg total) by mouth 2 (two) times daily.    Dispense:  10 capsule    Refill:  0   triamcinolone cream (KENALOG) 0.1 %    Sig: Apply 1 Application topically 2 (two) times daily.    Dispense:  45 g    Refill:  1       Discharge Instructions      We have sent testing for sexually transmitted infections. We will notify you of any positive results once they are received. If required, we will prescribe any medications you might need.  Please refrain from all sexual activity for at least the next seven days.     Without s/s of PID.  Cytology pending.   Will notify of any positive results. Instructed to refrain from sexual activity for at least seven days.  Reviewed expectations re: course of current medical issues. Questions answered. Outlined signs and symptoms indicating need for more acute intervention. Patient verbalized understanding. After Visit Summary given.   SUBJECTIVE:  Sharon Johnston is a 32 y.o. female who Patient here today with c/o vaginal discharge X 3 days and a sore in her left side of her mouth X 1 week. She would like to be tested for all STDs.   Patient is also here c/o rash on right underarm, neck, and left leg. She states that it is eczema and would like to get a cream for it.   Also reports "bumps in groin" after shaving recently. No related pain.  Patient's last menstrual period was 08/29/2022 (exact date).   OBJECTIVE:  Vitals:   09/05/22 1413 09/05/22 1414  BP:  (!) 158/117  Pulse:  90  Resp:  16  Temp:  98.1 F (36.7 C)   TempSrc:  Oral  SpO2:  98%  Weight: 54.4 kg   Height: 5\' 2"  (1.575 m)      General appearance: alert, cooperative, appears stated age and no distress Lungs: unlabored respirations; speaks full sentences without difficulty Back: no CVA tenderness; FROM at waist Abdomen: soft, non-tender GU: RN chaperone present: infected/inflamed hair follicles of groin; no signs of HSV Skin: warm and dry; eczema of upper arms Psychological: alert and cooperative; normal mood and affect.      No Known Allergies  Past Medical History:  Diagnosis Date   Chlamydia    Depression    hospitalized for 2 weeks after suicide att was post partum   Hx of gonorrhea    Hypertension    gestational HTN   Kidney infection    Family History  Problem Relation Age of Onset   Hypertension Mother    Hypertension Maternal Grandmother    Healthy Father    Hypertension Maternal Aunt    Depression Cousin    Social History   Socioeconomic History   Marital status: Single    Spouse name: Not on file   Number of children: 4   Years of education: diploma   Highest education level: Not on file  Occupational History   Not on file  Tobacco Use   Smoking status: Some Days  Types: Cigars   Smokeless tobacco: Never   Tobacco comments:    two black and milds per day  Vaping Use   Vaping Use: Never used  Substance and Sexual Activity   Alcohol use: No   Drug use: No   Sexual activity: Yes    Birth control/protection: None  Other Topics Concern   Not on file  Social History Narrative   ** Merged History Encounter **       Social Determinants of Health   Financial Resource Strain: Not on file  Food Insecurity: Not on file  Transportation Needs: Not on file  Physical Activity: Not on file  Stress: Not on file  Social Connections: Not on file  Intimate Partner Violence: Not on file           [place], MD 09/07/22 [phone]

## 2022-09-14 ENCOUNTER — Ambulatory Visit: Payer: Medicaid Other | Admitting: Obstetrics

## 2022-09-24 ENCOUNTER — Telehealth: Payer: Medicaid Other

## 2022-11-11 ENCOUNTER — Ambulatory Visit (HOSPITAL_COMMUNITY)
Admission: RE | Admit: 2022-11-11 | Discharge: 2022-11-11 | Disposition: A | Discharge status: Home / Self Care | Payer: Medicaid Other | Source: Ambulatory Visit | Attending: Emergency Medicine | Admitting: Emergency Medicine

## 2022-11-11 ENCOUNTER — Encounter (HOSPITAL_COMMUNITY): Payer: Self-pay

## 2022-11-11 VITALS — BP 166/95 | HR 74 | Temp 98.6°F | Resp 18 | Ht 62.0 in | Wt 121.0 lb

## 2022-11-11 DIAGNOSIS — N760 Acute vaginitis: Secondary | ICD-10-CM | POA: Diagnosis not present

## 2022-11-11 DIAGNOSIS — R030 Elevated blood-pressure reading, without diagnosis of hypertension: Secondary | ICD-10-CM | POA: Insufficient documentation

## 2022-11-11 DIAGNOSIS — Z113 Encounter for screening for infections with a predominantly sexual mode of transmission: Secondary | ICD-10-CM | POA: Diagnosis present

## 2022-11-11 DIAGNOSIS — Z8719 Personal history of other diseases of the digestive system: Secondary | ICD-10-CM | POA: Diagnosis not present

## 2022-11-11 LAB — POCT URINALYSIS DIP (MANUAL ENTRY)
Bilirubin, UA: NEGATIVE
Glucose, UA: NEGATIVE mg/dL
Ketones, POC UA: NEGATIVE mg/dL
Leukocytes, UA: NEGATIVE
Nitrite, UA: NEGATIVE
Protein Ur, POC: NEGATIVE mg/dL
Spec Grav, UA: 1.025 (ref 1.010–1.025)
Urobilinogen, UA: 0.2 E.U./dL
pH, UA: 6.5 (ref 5.0–8.0)

## 2022-11-11 LAB — HIV ANTIBODY (ROUTINE TESTING W REFLEX): HIV Screen 4th Generation wRfx: NONREACTIVE

## 2022-11-11 LAB — POCT URINE PREGNANCY: Preg Test, Ur: NEGATIVE

## 2022-11-11 MED ORDER — METRONIDAZOLE 500 MG PO TABS: 500.0000 mg | ORAL_TABLET | Freq: Two times a day (BID) | ORAL | 0 refills | Status: DC

## 2022-11-11 NOTE — Discharge Instructions (Addendum)
Take medication as prescribed for likely BV. We are sending swab to the lab for testing and will contact you in a few days with results. Follow-up with PCP or gyn if persistent symptoms or concerns.  We are also sending out blood work to test for HIV and syphilis as well as HSV and will contact you in a few days with these results.   Follow-up with PCP for elevated blood pressure.
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
## 2022-11-11 NOTE — ED Triage Notes (Signed)
Patient here today with c/o vaginal discharge and lower abd pain X 3 days. Patient would like to be tested for all STDs.

## 2022-11-12 LAB — RPR: RPR Ser Ql: NONREACTIVE

## 2022-11-13 ENCOUNTER — Encounter: Payer: Self-pay | Admitting: Obstetrics & Gynecology

## 2022-11-13 LAB — HSV(HERPES SIMPLEX VRS) I + II AB-IGG
HSV 1 Glycoprotein G Ab, IgG: 3.22 {index} — ABNORMAL HIGH (ref 0.00–0.90)
HSV 2 Glycoprotein G Ab, IgG: 23.2 {index} — ABNORMAL HIGH (ref 0.00–0.90)

## 2022-11-14 ENCOUNTER — Telehealth (HOSPITAL_COMMUNITY): Payer: Self-pay | Admitting: *Deleted

## 2022-11-14 ENCOUNTER — Telehealth (HOSPITAL_COMMUNITY): Payer: Self-pay | Admitting: Nurse Practitioner

## 2022-11-14 LAB — CERVICOVAGINAL ANCILLARY ONLY
Bacterial Vaginitis (gardnerella): POSITIVE — AB
Candida Glabrata: NEGATIVE
Candida Vaginitis: NEGATIVE
Chlamydia: NEGATIVE
Comment: NEGATIVE
Comment: NEGATIVE
Comment: NEGATIVE
Comment: NEGATIVE
Comment: NEGATIVE
Comment: NORMAL
Neisseria Gonorrhea: NEGATIVE
Trichomonas: NEGATIVE

## 2022-11-14 NOTE — Telephone Encounter (Signed)
Pt called about HSV labs and cyto.   Went over HSV labs with her advised she only needs treated when she has an outbreak. She states her partner is coming tomorrow and would like labs advised it would be up to that provider.  Pt needs treated for BV will advise provider to send Rx to [facility]

## 2022-11-14 NOTE — Telephone Encounter (Signed)
Erroneous encounter

## 2022-11-16 ENCOUNTER — Encounter (HOSPITAL_COMMUNITY): Payer: Self-pay

## 2022-11-16 ENCOUNTER — Emergency Department (HOSPITAL_COMMUNITY)
Admission: EM | Admit: 2022-11-16 | Discharge: 2022-11-16 | Disposition: A | Discharge status: Home / Self Care | Payer: Medicaid Other | Attending: Emergency Medicine | Admitting: Emergency Medicine

## 2022-11-16 DIAGNOSIS — W44B0XA Plastic object unspecified, entering into or through a natural orifice, initial encounter: Secondary | ICD-10-CM | POA: Insufficient documentation

## 2022-11-16 DIAGNOSIS — T162XXA Foreign body in left ear, initial encounter: Secondary | ICD-10-CM

## 2022-11-16 DIAGNOSIS — F419 Anxiety disorder, unspecified: Secondary | ICD-10-CM | POA: Insufficient documentation

## 2022-11-16 NOTE — ED Provider Notes (Signed)
[place] EMERGENCY DEPARTMENT AT [facility] Provider Note   CSN: [id number] Arrival date & time: 11/16/22  0550     History  Chief Complaint  Patient presents with   Foreign Body in Ear    BUG    Sharon Johnston is a 32 y.o. female.  The history is provided by the patient.  Foreign Body in Ear This is a new problem. The problem occurs constantly. The problem has been rapidly worsening. Nothing relieves the symptoms.  Patient presents for foreign body in her left ear.  She reports she woke up and felt something crawling in her ear.  She started hurting and called 911     Home Medications Prior to Admission medications   Medication Sig Start Date End Date Taking? Authorizing Provider  Blood Pressure Monitoring (BLOOD PRESSURE KIT) DEVI 1 kit by Does not apply route as needed. 08/30/22   Adam Phenix, MD  doxycycline (VIBRAMYCIN) 100 MG capsule Take 1 capsule (100 mg total) by mouth 2 (two) times daily. 09/05/22   Mardella Layman, MD  metroNIDAZOLE (FLAGYL) 500 MG tablet Take 1 tablet (500 mg total) by mouth 2 (two) times daily. 11/11/22   Estanislado Pandy, PA  Misc. Devices (GOJJI WEIGHT SCALE) MISC 1 Device by Does not apply route as needed. Patient not taking: Reported on 04/22/2021 12/24/20   Hermina Staggers, MD  triamcinolone cream (KENALOG) 0.1 % Apply 1 Application topically 2 (two) times daily. 09/05/22   Mardella Layman, MD  ferrous sulfate 325 (65 FE) MG tablet Take 1 tablet (325 mg total) by mouth 2 (two) times daily with a meal. 04/25/17 11/01/18  Pincus Large, DO  medroxyPROGESTERone (DEPO-PROVERA) 150 MG/ML injection Inject 1 mL (150 mg total) into the muscle every 3 (three) months. 07/26/17 04/22/19  Adam Phenix, MD      Allergies    Patient has no known allergies.    Review of Systems   Review of Systems  Physical Exam Updated Vital Signs BP (!) 167/123   Pulse 88   Temp 98.8 F (37.1 C)   Ht 1.575 m (5\' 2" )   Wt 54.9 kg   LMP 10/23/2022 (Exact Date)    SpO2 100%   BMI 22.13 kg/m  Physical Exam CONSTITUTIONAL: Anxious and agitated HEAD: Normocephalic/atraumatic EYES: EOMI/PERRL ENMT: Mucous membranes moist Right TM clear and intact In the left ear canal there is a small piece of plastic NEURO: Pt is awake/alert/appropriate, moves all extremitiesx4.  No facial droop.   PSYCH: Anxious  ED Results / Procedures / Treatments   Labs (all labs ordered are listed, but only abnormal results are displayed) Labs Reviewed - No data to display  EKG None  Radiology No results found.  Procedures .Foreign Body Removal  Date/Time: 11/16/2022 6:23 AM  Performed by: Zadie Rhine, MD Authorized by: Zadie Rhine, MD  Consent: Verbal consent obtained. Body area: ear Location details: left ear Patient cooperative: no Localization method: visualized Removal mechanism: forceps Complexity: simple 1 objects recovered. Objects recovered: Piece of plastic Post-procedure assessment: foreign body removed Patient tolerance: patient tolerated the procedure well with no immediate complications      Medications Ordered in ED Medications - No data to display  ED Course/ Medical Decision Making/ A&P                                 Medical Decision Making  Patient arrives via  EMS for foreign body in her left ear.  Patient initially very agitated.  1 piece of plastic was found in the outer portion of her ear canal and it was removed without difficulty.  On reassessment there is no other foreign bodies or insects in her left ear.  Left TM is clear and intact.  Elevated blood pressure likely due to her agitation on arrival No other acute complaints, patient safe for discharge        Final Clinical Impression(s) / ED Diagnoses Final diagnoses:  Foreign body of left ear, initial encounter    Rx / DC Orders ED Discharge Orders     None         Zadie Rhine, MD 11/16/22 [phone]

## 2022-11-16 NOTE — ED Triage Notes (Signed)
Pt coming in from home complaining of a bug in her left ear.   Bp 170/100 Hr 94 Spo2 98% rr24

## 2022-11-16 NOTE — ED Notes (Signed)
Pt walked out of ed before discharge education could be completed.

## 2022-11-18 ENCOUNTER — Other Ambulatory Visit: Payer: Self-pay | Admitting: Obstetrics & Gynecology

## 2022-11-18 MED ORDER — VALACYCLOVIR HCL 500 MG PO TABS: 500.0000 mg | ORAL_TABLET | Freq: Two times a day (BID) | ORAL | 0 refills | Status: AC

## 2022-12-13 ENCOUNTER — Other Ambulatory Visit: Payer: Self-pay | Admitting: Obstetrics & Gynecology

## 2022-12-14 ENCOUNTER — Ambulatory Visit (HOSPITAL_COMMUNITY): Payer: Self-pay

## 2022-12-27 ENCOUNTER — Other Ambulatory Visit: Payer: Self-pay | Admitting: *Deleted

## 2023-01-02 ENCOUNTER — Ambulatory Visit (HOSPITAL_COMMUNITY): Payer: Medicaid Other

## 2023-02-13 ENCOUNTER — Ambulatory Visit (HOSPITAL_COMMUNITY): Payer: Self-pay

## 2023-03-10 ENCOUNTER — Ambulatory Visit (HOSPITAL_COMMUNITY): Payer: Medicaid Other | Admitting: Clinical

## 2023-03-10 DIAGNOSIS — F331 Major depressive disorder, recurrent, moderate: Secondary | ICD-10-CM | POA: Diagnosis not present
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
## 2023-03-16 ENCOUNTER — Emergency Department (HOSPITAL_COMMUNITY): Payer: Medicaid Other

## 2023-03-16 ENCOUNTER — Encounter (HOSPITAL_COMMUNITY): Payer: Self-pay

## 2023-03-16 ENCOUNTER — Other Ambulatory Visit: Payer: Self-pay

## 2023-03-16 ENCOUNTER — Emergency Department (HOSPITAL_COMMUNITY)
Admission: EM | Admit: 2023-03-16 | Discharge: 2023-03-16 | Disposition: A | Discharge status: Home / Self Care | Payer: Medicaid Other | Attending: Emergency Medicine | Admitting: Emergency Medicine

## 2023-03-16 DIAGNOSIS — Z113 Encounter for screening for infections with a predominantly sexual mode of transmission: Secondary | ICD-10-CM | POA: Insufficient documentation

## 2023-03-16 DIAGNOSIS — N12 Tubulo-interstitial nephritis, not specified as acute or chronic: Secondary | ICD-10-CM | POA: Insufficient documentation

## 2023-03-16 DIAGNOSIS — R109 Unspecified abdominal pain: Secondary | ICD-10-CM | POA: Diagnosis present

## 2023-03-16 DIAGNOSIS — I10 Essential (primary) hypertension: Secondary | ICD-10-CM | POA: Insufficient documentation

## 2023-03-16 LAB — COMPREHENSIVE METABOLIC PANEL
ALT: 11 U/L (ref 0–44)
AST: 18 U/L (ref 15–41)
Albumin: 3.9 g/dL (ref 3.5–5.0)
Alkaline Phosphatase: 53 U/L (ref 38–126)
Anion gap: 13 (ref 5–15)
BUN: 13 mg/dL (ref 6–20)
CO2: 23 mmol/L (ref 22–32)
Calcium: 9.3 mg/dL (ref 8.9–10.3)
Chloride: 95 mmol/L — ABNORMAL LOW (ref 98–111)
Creatinine, Ser: 0.96 mg/dL (ref 0.44–1.00)
GFR, Estimated: >60 mL/min (ref >60)
Glucose, Bld: 89 mg/dL (ref 70–99)
Potassium: 3.8 mmol/L (ref 3.5–5.1)
Sodium: 131 mmol/L — ABNORMAL LOW (ref 135–145)
Total Bilirubin: 1.1 mg/dL (ref <1.2)
Total Protein: 7.9 g/dL (ref 6.5–8.1)

## 2023-03-16 LAB — URINALYSIS, ROUTINE W REFLEX MICROSCOPIC
Bilirubin Urine: NEGATIVE
Glucose, UA: NEGATIVE mg/dL
Ketones, ur: 20 mg/dL — AB
Nitrite: POSITIVE — AB
Protein, ur: 100 mg/dL — AB
Specific Gravity, Urine: 1.02 (ref 1.005–1.030)
Trans Epithel, UA: 2
WBC, UA: >50 WBC/hpf (ref 0–5)
pH: 5 (ref 5.0–8.0)

## 2023-03-16 LAB — CBC
HCT: 40.2 % (ref 36.0–46.0)
Hemoglobin: 13.1 g/dL (ref 12.0–15.0)
MCH: 27.6 pg (ref 26.0–34.0)
MCHC: 32.6 g/dL (ref 30.0–36.0)
MCV: 84.8 fL (ref 80.0–100.0)
Platelets: 271 10*3/uL (ref 150–400)
RBC: 4.74 MIL/uL (ref 3.87–5.11)
RDW: 14.5 % (ref 11.5–15.5)
WBC: 8.7 10*3/uL (ref 4.0–10.5)
nRBC: 0 % (ref 0.0–0.2)

## 2023-03-16 LAB — WET PREP, GENITAL
Clue Cells Wet Prep HPF POC: NONE SEEN
Sperm: NONE SEEN
Trich, Wet Prep: NONE SEEN
WBC, Wet Prep HPF POC: <10 (ref <10)
Yeast Wet Prep HPF POC: NONE SEEN

## 2023-03-16 LAB — HCG, SERUM, QUALITATIVE: Preg, Serum: NEGATIVE

## 2023-03-16 LAB — LIPASE, BLOOD: Lipase: 25 U/L (ref 11–51)

## 2023-03-16 LAB — I-STAT CG4 LACTIC ACID, ED: Lactic Acid, Venous: 1.4 mmol/L (ref 0.5–1.9)

## 2023-03-16 LAB — TROPONIN I (HIGH SENSITIVITY)
Troponin I (High Sensitivity): 8 ng/L (ref <18)
Troponin I (High Sensitivity): 6 ng/L (ref <18)

## 2023-03-16 MED ORDER — IOHEXOL 300 MG/ML  SOLN
100.0000 mL | Freq: Once | INTRAMUSCULAR | Status: AC | PRN
  Administered 2023-03-16: 100 mL via INTRAVENOUS

## 2023-03-16 MED ORDER — ACETAMINOPHEN 325 MG PO TABS
650.0000 mg | ORAL_TABLET | Freq: Once | ORAL | Status: AC
  Administered 2023-03-16: 650 mg via ORAL
  Filled 2023-03-16: qty 2

## 2023-03-16 MED ORDER — SODIUM CHLORIDE 0.9 % IV BOLUS
1000.0000 mL | Freq: Once | INTRAVENOUS | Status: AC
  Administered 2023-03-16: 1000 mL via INTRAVENOUS

## 2023-03-16 MED ORDER — MORPHINE SULFATE (PF) 2 MG/ML IV SOLN
2.0000 mg | Freq: Once | INTRAVENOUS | Status: AC
  Administered 2023-03-16: 2 mg via INTRAVENOUS
  Filled 2023-03-16: qty 1

## 2023-03-16 MED ORDER — DOXYCYCLINE HYCLATE 100 MG PO CAPS: 100.0000 mg | ORAL_CAPSULE | Freq: Two times a day (BID) | ORAL | 0 refills | Status: DC

## 2023-03-16 MED ORDER — ONDANSETRON HCL 4 MG PO TABS
4.0000 mg | ORAL_TABLET | Freq: Four times a day (QID) | ORAL | 0 refills | Status: DC | PRN
Start: 2023-03-16 — End: 2023-06-28

## 2023-03-16 MED ORDER — ONDANSETRON HCL 4 MG/2ML IJ SOLN
4.0000 mg | Freq: Once | INTRAMUSCULAR | Status: AC
  Administered 2023-03-16: 4 mg via INTRAVENOUS
  Filled 2023-03-16: qty 2

## 2023-03-16 MED ORDER — SODIUM CHLORIDE 0.9 % IV SOLN
1.0000 g | Freq: Once | INTRAVENOUS | Status: AC
  Administered 2023-03-16: 1 g via INTRAVENOUS
  Filled 2023-03-16: qty 10

## 2023-03-16 MED ORDER — CEFPODOXIME PROXETIL 200 MG PO TABS: 200.0000 mg | ORAL_TABLET | Freq: Two times a day (BID) | ORAL | 0 refills | Status: DC

## 2023-03-16 NOTE — Discharge Instructions (Signed)
As we discussed, you workup is concerning for pyelonephritis.  This is a kidney infection.  Please been taking cefpodoxime twice a day for the next 12 days.  Also we discussed, your increased vaginal discharge could be result of STIs.  Your STI testing is pending at this time.  He received IV antibiotics here but please continue taking doxycycline twice a day for the next 7 days.  Please take Zofran every 6 hours as needed for nausea and vomiting.  Please take Tylenol for 6 hours as needed for fevers.  Please follow-up with your primary care doctor.  Please return to the ED with any new or worsening signs or symptoms.

## 2023-03-16 NOTE — ED Triage Notes (Signed)
Pt presenting with fever and abd pain for 3x days. One episode of emesis today. Pt took 650mg  of tylenol at apprx 1130. States it feels like when she had a kidney infection.

## 2023-03-16 NOTE — ED Notes (Signed)
Lab called to add on urine culture.  ?

## 2023-03-16 NOTE — ED Provider Notes (Signed)
[place] EMERGENCY DEPARTMENT AT [facility] Provider Note   CSN: [id number] Arrival date & time: 03/16/23  1252     History  Chief Complaint  Patient presents with   Abdominal Pain    Sharon Johnston is a 32 y.o. female with medical history of kidney infections and hypertension.  She presents to the ED for evaluation of abdominal pain, chest pain, shortness of breath, vaginal discharge.  Reports her symptoms all began 3 days ago.  States chest pain is constant, nonradiating, associated with shortness of breath.  Also endorsing lightheadedness and dizziness.  She is also complaining of abdominal pain, vaginal discharge over the last 3 days.  She states that her vaginal discharge is increased from baseline.  She is also endorsing dysuria.  Endorses recent new sexual partners and is concerned about STIs.  She endorses fevers at home associated with abdominal pain, nausea and vomiting.  Took medication, states 30 mg of Tylenol at 1130.  States last menstrual period was November 20.   Abdominal Pain Associated symptoms: chest pain, dysuria, nausea, shortness of breath and vaginal discharge        Home Medications Prior to Admission medications   Medication Sig Start Date End Date Taking? Authorizing Provider  cefpodoxime (VANTIN) 200 MG tablet Take 1 tablet (200 mg total) by mouth 2 (two) times daily. 03/16/23  Yes Al Decant, PA-C  doxycycline (VIBRAMYCIN) 100 MG capsule Take 1 capsule (100 mg total) by mouth 2 (two) times daily. 03/16/23  Yes Al Decant, PA-C  ondansetron (ZOFRAN) 4 MG tablet Take 1 tablet (4 mg total) by mouth every 6 (six) hours as needed for nausea or vomiting. 03/16/23  Yes Al Decant, PA-C  Blood Pressure Monitoring (BLOOD PRESSURE KIT) DEVI 1 kit by Does not apply route as needed. 08/30/22   Adam Phenix, MD  metroNIDAZOLE (FLAGYL) 500 MG tablet Take 1 tablet (500 mg total) by mouth 2 (two) times daily. 11/11/22   Estanislado Pandy, PA  Misc. Devices (GOJJI WEIGHT SCALE) MISC 1 Device by Does not apply route as needed. Patient not taking: Reported on 04/22/2021 12/24/20   Hermina Staggers, MD  triamcinolone cream (KENALOG) 0.1 % Apply 1 Application topically 2 (two) times daily. 09/05/22   Mardella Layman, MD  valACYclovir (VALTREX) 500 MG tablet Take 500 mg by mouth 2 (two) times daily. 11/30/22   [provider]  ferrous sulfate 325 (65 FE) MG tablet Take 1 tablet (325 mg total) by mouth 2 (two) times daily with a meal. 04/25/17 11/01/18  Pincus Large, DO  medroxyPROGESTERone (DEPO-PROVERA) 150 MG/ML injection Inject 1 mL (150 mg total) into the muscle every 3 (three) months. 07/26/17 04/22/19  Adam Phenix, MD      Allergies    Patient has no known allergies.    Review of Systems   Review of Systems  Respiratory:  Positive for shortness of breath.   Cardiovascular:  Positive for chest pain.  Gastrointestinal:  Positive for abdominal pain and nausea.  Genitourinary:  Positive for dysuria and vaginal discharge.  Neurological:  Positive for dizziness and light-headedness.  All other systems reviewed and are negative.   Physical Exam Updated Vital Signs BP (!) 140/71   Pulse 79   Temp 98.3 F (36.8 C)   Resp 16   Ht 5\' 3"  (1.6 m)   Wt 54.4 kg   LMP 02/08/2023   SpO2 100%   BMI 21.26 kg/m  Physical Exam  Vitals and nursing note reviewed.  Constitutional:      General: She is not in acute distress.    Appearance: Normal appearance. She is not ill-appearing, toxic-appearing or diaphoretic.  HENT:     Head: Normocephalic and atraumatic.     Nose: Nose normal.     Mouth/Throat:     Mouth: Mucous membranes are moist.     Pharynx: Oropharynx is clear.  Eyes:     Extraocular Movements: Extraocular movements intact.     Conjunctiva/sclera: Conjunctivae normal.     Pupils: Pupils are equal, round, and reactive to light.  Cardiovascular:     Rate and Rhythm: Normal rate and regular rhythm.   Pulmonary:     Effort: Pulmonary effort is normal.     Breath sounds: Normal breath sounds. No wheezing.  Abdominal:     General: Abdomen is flat. Bowel sounds are normal.     Palpations: Abdomen is soft.     Tenderness: There is abdominal tenderness.     Comments: Lower abdominal tenderness  Musculoskeletal:     Cervical back: Normal range of motion and neck supple. No tenderness.  Skin:    General: Skin is warm and dry.     Capillary Refill: Capillary refill takes less than 2 seconds.  Neurological:     Mental Status: She is alert and oriented to person, place, and time.     ED Results / Procedures / Treatments   Labs (all labs ordered are listed, but only abnormal results are displayed) Labs Reviewed  URINALYSIS, ROUTINE W REFLEX MICROSCOPIC - Abnormal; Notable for the following components:      Result Value   APPearance CLOUDY (*)    Hgb urine dipstick LARGE (*)    Ketones, ur 20 (*)    Protein, ur 100 (*)    Nitrite POSITIVE (*)    Leukocytes,Ua MODERATE (*)    Bacteria, UA MANY (*)    All other components within normal limits  COMPREHENSIVE METABOLIC PANEL - Abnormal; Notable for the following components:   Sodium 131 (*)    Chloride 95 (*)    All other components within normal limits  WET PREP, GENITAL  URINE CULTURE  CBC  HCG, SERUM, QUALITATIVE  LIPASE, BLOOD  I-STAT CG4 LACTIC ACID, ED  GC/CHLAMYDIA PROBE AMP ([facility]) NOT AT [facility]  TROPONIN I (HIGH SENSITIVITY)  TROPONIN I (HIGH SENSITIVITY)    EKG None  Radiology CT ABDOMEN PELVIS W CONTRAST Result Date: 03/16/2023 CLINICAL DATA:  Acute abdominal for 3 days.  Vomiting.  Fever. EXAM: CT ABDOMEN AND PELVIS WITH CONTRAST TECHNIQUE: Multidetector CT imaging of the abdomen and pelvis was performed using the standard protocol following bolus administration of intravenous contrast. RADIATION DOSE REDUCTION: This exam was performed according to the departmental dose-optimization program which includes  automated exposure control, adjustment of the mA and/or kV according to patient size and/or use of iterative reconstruction technique. CONTRAST:  OMNIPAQUE IOHEXOL 300 MG/ML  SOLN COMPARISON:  02/20/2017 FINDINGS: Lower Chest: No acute findings. Hepatobiliary:  No suspicious hepatic masses identified. Pancreas:  No mass or inflammatory changes. Spleen: Within normal limits in size and appearance. Adrenals/Urinary Tract: Several poorly defined areas of low-attenuation are seen the left renal parenchyma, suspicious for pyelonephritis. No evidence of ureteral calculi or hydronephrosis. Stomach/Bowel: No evidence of obstruction, inflammatory process or abnormal fluid collections. Normal appendix. Vascular/Lymphatic: No pathologically enlarged lymph nodes. No acute vascular findings. Reproductive:  No mass or other significant abnormality. Other:  None. Musculoskeletal:  No suspicious bone lesions identified. IMPRESSION: Several ill-defined areas of decreased enhancement in left kidney, highly suspicious for pyelonephritis. No evidence of ureteral calculi or hydronephrosis. Electronically Signed   By: Danae Orleans M.D.   On: 03/16/2023 16:03   DG Chest 2 View Result Date: 03/16/2023 CLINICAL DATA:  Shortness of breath and chest pain EXAM: CHEST - 2 VIEW COMPARISON:  Chest x-ray January 18, 2020 FINDINGS: The cardiomediastinal silhouette is unchanged in contour. No focal pulmonary opacity. No pleural effusion or pneumothorax. The visualized upper abdomen is unremarkable. No acute osseous abnormality. IMPRESSION: No active cardiopulmonary disease. Electronically Signed   By: Jacob Moores M.D.   On: 03/16/2023 15:24    Procedures Procedures   Medications Ordered in ED Medications  ondansetron (ZOFRAN) injection 4 mg (4 mg Intravenous Given 03/16/23 1346)  sodium chloride 0.9 % bolus 1,000 mL (1,000 mLs Intravenous New Bag/Given 03/16/23 1346)  morphine (PF) 2 MG/ML injection 2 mg (2 mg Intravenous  Given 03/16/23 1355)  acetaminophen (TYLENOL) tablet 650 mg (650 mg Oral Given 03/16/23 1419)  iohexol (OMNIPAQUE) 300 MG/ML solution 100 mL (100 mLs Intravenous Contrast Given 03/16/23 1456)  cefTRIAXone (ROCEPHIN) 1 g in sodium chloride 0.9 % 100 mL IVPB (1 g Intravenous New Bag/Given 03/16/23 1606)  morphine (PF) 2 MG/ML injection 2 mg (2 mg Intravenous Given 03/16/23 1620)    ED Course/ Medical Decision Making/ A&P  Medical Decision Making Amount and/or Complexity of Data Reviewed Labs: ordered. Radiology: ordered.  Risk OTC drugs. Prescription drug management.   32 year old female presents for evaluation.  Please see HPI for further details.  On examination the patient is afebrile, nontachycardic.  Her lung sounds are clear bilaterally, she is not hypoxic.  She has suprapubic tenderness, no rebound or guarding.  Neurological examination is at baseline.  Will collect extensive labs and reassess.  Patient CBC without leukocytosis or anemia.  Metabolic panel shows sodium 131, no other electrolyte derangement.  Troponin is 8, 6 with delta.  Chest x-ray is unremarkable.  EKG nonischemic.  Lactic acid not elevated to 1.4.  Urinalysis shows large hemoglobin, positive nitrites, leukocytes.  Will culture patient urine.  Wet prep unremarkable.  CT abdomen pelvis shows pyelonephritis.  Patient provided 4 mg Zofran for nausea, 4 mg of morphine for pain, 650 Tylenol for fever.  Provided 1 g ceftriaxone for pyelonephritis.  Will send patient home with doxycycline and advised her to follow-up on results of GC testing done on MyChart.  Will also send patient home with Vantin for pyelonephritis.  Will have her follow-up with her PCP.  Encouraged to return with any new or worsening symptoms.   Final Clinical Impression(s) / ED Diagnoses Final diagnoses:  Encounter for screening for bacterial sexually transmitted infection  Pyelonephritis    Rx / DC Orders ED Discharge Orders          Ordered     doxycycline (VIBRAMYCIN) 100 MG capsule  2 times daily        03/16/23 1840    cefpodoxime (VANTIN) 200 MG tablet  2 times daily        03/16/23 1840    ondansetron (ZOFRAN) 4 MG tablet  Every 6 hours PRN        03/16/23 1840              Al Decant, PA-C 03/16/23 1840    Melene Plan, DO 03/17/23 [phone]

## 2023-03-17 ENCOUNTER — Telehealth (HOSPITAL_COMMUNITY): Payer: Self-pay | Admitting: Emergency Medicine

## 2023-03-17 ENCOUNTER — Emergency Department (HOSPITAL_COMMUNITY)
Admission: EM | Admit: 2023-03-17 | Discharge: 2023-03-17 | Disposition: A | Discharge status: Home / Self Care | Payer: Medicaid Other

## 2023-03-17 ENCOUNTER — Encounter (HOSPITAL_COMMUNITY): Payer: Self-pay

## 2023-03-17 ENCOUNTER — Other Ambulatory Visit: Payer: Self-pay

## 2023-03-17 DIAGNOSIS — R519 Headache, unspecified: Secondary | ICD-10-CM | POA: Diagnosis not present

## 2023-03-17 DIAGNOSIS — N12 Tubulo-interstitial nephritis, not specified as acute or chronic: Secondary | ICD-10-CM | POA: Diagnosis not present

## 2023-03-17 DIAGNOSIS — R109 Unspecified abdominal pain: Secondary | ICD-10-CM | POA: Diagnosis present

## 2023-03-17 LAB — CBC WITH DIFFERENTIAL/PLATELET
Abs Immature Granulocytes: 0.03 10*3/uL (ref 0.00–0.07)
Basophils Absolute: 0 10*3/uL (ref 0.0–0.1)
Basophils Relative: 0 %
Eosinophils Absolute: 0 10*3/uL (ref 0.0–0.5)
Eosinophils Relative: 0 %
HCT: 36 % (ref 36.0–46.0)
Hemoglobin: 11.3 g/dL — ABNORMAL LOW (ref 12.0–15.0)
Immature Granulocytes: 0 %
Lymphocytes Relative: 13 %
Lymphs Abs: 0.9 10*3/uL (ref 0.7–4.0)
MCH: 26.6 pg (ref 26.0–34.0)
MCHC: 31.4 g/dL (ref 30.0–36.0)
MCV: 84.7 fL (ref 80.0–100.0)
Monocytes Absolute: 0.7 10*3/uL (ref 0.1–1.0)
Monocytes Relative: 10 %
Neutro Abs: 5.5 10*3/uL (ref 1.7–7.7)
Neutrophils Relative %: 77 %
Platelets: 250 10*3/uL (ref 150–400)
RBC: 4.25 MIL/uL (ref 3.87–5.11)
RDW: 14.5 % (ref 11.5–15.5)
WBC: 7.2 10*3/uL (ref 4.0–10.5)
nRBC: 0 % (ref 0.0–0.2)

## 2023-03-17 LAB — COMPREHENSIVE METABOLIC PANEL
ALT: 14 U/L (ref 0–44)
AST: 17 U/L (ref 15–41)
Albumin: 3.6 g/dL (ref 3.5–5.0)
Alkaline Phosphatase: 39 U/L (ref 38–126)
Anion gap: 9 (ref 5–15)
BUN: 14 mg/dL (ref 6–20)
CO2: 23 mmol/L (ref 22–32)
Calcium: 8.8 mg/dL — ABNORMAL LOW (ref 8.9–10.3)
Chloride: 101 mmol/L (ref 98–111)
Creatinine, Ser: 0.85 mg/dL (ref 0.44–1.00)
GFR, Estimated: >60 mL/min (ref >60)
Glucose, Bld: 106 mg/dL — ABNORMAL HIGH (ref 70–99)
Potassium: 3.3 mmol/L — ABNORMAL LOW (ref 3.5–5.1)
Sodium: 133 mmol/L — ABNORMAL LOW (ref 135–145)
Total Bilirubin: 0.9 mg/dL (ref <1.2)
Total Protein: 7.4 g/dL (ref 6.5–8.1)

## 2023-03-17 LAB — GC/CHLAMYDIA PROBE AMP (~~LOC~~) NOT AT ARMC
Chlamydia: NEGATIVE
Comment: NEGATIVE
Comment: NORMAL
Neisseria Gonorrhea: NEGATIVE

## 2023-03-17 LAB — I-STAT CG4 LACTIC ACID, ED: Lactic Acid, Venous: 0.7 mmol/L (ref 0.5–1.9)

## 2023-03-17 MED ORDER — CEFDINIR 300 MG PO CAPS: 300.0000 mg | ORAL_CAPSULE | Freq: Two times a day (BID) | ORAL | 0 refills | Status: DC

## 2023-03-17 MED ORDER — ONDANSETRON HCL 4 MG PO TABS: 4.0000 mg | ORAL_TABLET | Freq: Three times a day (TID) | ORAL | Status: DC | PRN

## 2023-03-17 MED ORDER — KETOROLAC TROMETHAMINE 10 MG PO TABS: 10.0000 mg | ORAL_TABLET | Freq: Four times a day (QID) | ORAL | 0 refills | Status: DC | PRN

## 2023-03-17 MED ORDER — OXYCODONE-ACETAMINOPHEN 5-325 MG PO TABS: 1.0000 | ORAL_TABLET | Freq: Four times a day (QID) | ORAL | 0 refills | Status: DC | PRN

## 2023-03-17 MED ORDER — OXYCODONE-ACETAMINOPHEN 5-325 MG PO TABS
1.0000 | ORAL_TABLET | Freq: Once | ORAL | Status: AC
  Administered 2023-03-17: 1 via ORAL
  Filled 2023-03-17: qty 1

## 2023-03-17 MED ORDER — ONDANSETRON HCL 4 MG/2ML IJ SOLN
4.0000 mg | Freq: Once | INTRAMUSCULAR | Status: AC
  Administered 2023-03-17: 4 mg via INTRAVENOUS
  Filled 2023-03-17: qty 2

## 2023-03-17 MED ORDER — MORPHINE SULFATE (PF) 4 MG/ML IV SOLN
4.0000 mg | Freq: Once | INTRAVENOUS | Status: AC
  Administered 2023-03-17: 4 mg via INTRAVENOUS
  Filled 2023-03-17: qty 1

## 2023-03-17 MED ORDER — ACETAMINOPHEN 500 MG PO TABS
1000.0000 mg | ORAL_TABLET | Freq: Once | ORAL | Status: AC
  Administered 2023-03-17: 1000 mg via ORAL
  Filled 2023-03-17: qty 2

## 2023-03-17 MED ORDER — KETOROLAC TROMETHAMINE 15 MG/ML IJ SOLN
15.0000 mg | Freq: Once | INTRAMUSCULAR | Status: AC
  Administered 2023-03-17: 15 mg via INTRAVENOUS
  Filled 2023-03-17: qty 1

## 2023-03-17 NOTE — ED Notes (Signed)
Patient d/c with home care instructions. Patient stated she has a ride home. Patient stated she is calling a [employer].

## 2023-03-17 NOTE — ED Provider Triage Note (Signed)
Emergency Medicine Provider Triage Evaluation Note  Sharon Johnston , a 32 y.o. female  was evaluated in triage.  Pt complains of pain. Pt report abd and back pain ongoing for the past several days with fever, chills, body aches and urinary sxs.  Was seen yesterday for same, diagnosed with pyelonephritis based on CT and UA, d/c home with medication.  Sts pain has persists and she's unable to care for her kids.  Denies n/v.  Review of Systems  Positive: As above Negative: As above  Physical Exam  BP (!) 129/90   Pulse 96   Temp (!) 100.9 F (38.3 C) (Oral)   Resp 16   Ht 5\' 3"  (1.6 m)   Wt 54.4 kg   LMP 02/08/2023   SpO2 100%   BMI 21.26 kg/m  Gen:   Awake, no distress   Resp:  Normal effort  MSK:   Moves extremities without difficulty  Other:    Medical Decision Making  Medically screening exam initiated at 4:30 PM.  Appropriate orders placed.  Oral SAKINAH GUSTAVESON was informed that the remainder of the evaluation will be completed by another provider, this initial triage assessment does not replace that evaluation, and the importance of remaining in the ED until their evaluation is complete.     Fayrene Helper, PA-C 03/17/23 [phone]

## 2023-03-17 NOTE — Discharge Instructions (Signed)
Your labs here today were stable.  Please continue your antibiotics prescribed yesterday.  Take the Toradol as needed for pain.  If you are still having pain take the Percocet.  Take the Zofran as needed for nausea.  Please return to the emergency department for worsening symptoms.  Your cultures are pending.  If these necessitate any change in management you should receive a call tomorrow.

## 2023-03-17 NOTE — Telephone Encounter (Cosign Needed)
Received a call from [employer] that the Vantin was not covered for this patient requesting alternative prescription, sent Omnicef to pharmacy.

## 2023-03-17 NOTE — ED Triage Notes (Signed)
Pt arrives via EMS. Pt was seen here yesterday, and diagnosed pyelonephritis. Pt reports symptoms have not improved. Pt AxOx4.

## 2023-03-17 NOTE — ED Provider Notes (Signed)
[place] EMERGENCY DEPARTMENT AT [facility] Provider Note   CSN: [id number] Arrival date & time: 03/17/23  1546     History  Chief Complaint  Patient presents with   Abdominal Pain   Headache   Fever    Sharon Johnston is a 32 y.o. female.  32 year old female presenting to the emergency department today with persistent back pain and fevers.  The patient was evaluated here yesterday and was diagnosed with pyelonephritis.  Her urinalysis was positive and CT scan did show findings concerning for pyelonephritis.  There were no ureteral stones.  She states that she got home and did take her cefpodoxime as prescribed.  She states that she developed a fever today as well as a headache and was feeling worse again.  She came to the ER at that time for further evaluation.  She was febrile here on arrival.  She has had some nausea but denies any vomiting.  She denies any difficulty urinating at this time.   Abdominal Pain Associated symptoms: fever   Headache Associated symptoms: abdominal pain and fever   Fever Associated symptoms: headaches        Home Medications Prior to Admission medications   Medication Sig Start Date End Date Taking? Authorizing Provider  ketorolac (TORADOL) 10 MG tablet Take 1 tablet (10 mg total) by mouth every 6 (six) hours as needed. 03/17/23  Yes Durwin Glaze, MD  ondansetron (ZOFRAN) 4 MG tablet Take 1 tablet (4 mg total) by mouth every 8 (eight) hours as needed for nausea or vomiting. 03/17/23  Yes Durwin Glaze, MD  oxyCODONE-acetaminophen (PERCOCET/ROXICET) 5-325 MG tablet Take 1 tablet by mouth every 6 (six) hours as needed for severe pain (pain score 7-10). 03/17/23  Yes Durwin Glaze, MD  Blood Pressure Monitoring (BLOOD PRESSURE KIT) DEVI 1 kit by Does not apply route as needed. 08/30/22   Adam Phenix, MD  cefdinir (OMNICEF) 300 MG capsule Take 1 capsule (300 mg total) by mouth 2 (two) times daily. 03/17/23   Prosperi, Christian H,  PA-C  cefpodoxime (VANTIN) 200 MG tablet Take 1 tablet (200 mg total) by mouth 2 (two) times daily. 03/16/23   Al Decant, PA-C  doxycycline (VIBRAMYCIN) 100 MG capsule Take 1 capsule (100 mg total) by mouth 2 (two) times daily. 03/16/23   Al Decant, PA-C  metroNIDAZOLE (FLAGYL) 500 MG tablet Take 1 tablet (500 mg total) by mouth 2 (two) times daily. 11/11/22   Estanislado Pandy, PA  Misc. Devices (GOJJI WEIGHT SCALE) MISC 1 Device by Does not apply route as needed. Patient not taking: Reported on 04/22/2021 12/24/20   Hermina Staggers, MD  ondansetron (ZOFRAN) 4 MG tablet Take 1 tablet (4 mg total) by mouth every 6 (six) hours as needed for nausea or vomiting. 03/16/23   Al Decant, PA-C  triamcinolone cream (KENALOG) 0.1 % Apply 1 Application topically 2 (two) times daily. 09/05/22   Mardella Layman, MD  valACYclovir (VALTREX) 500 MG tablet Take 500 mg by mouth 2 (two) times daily. 11/30/22   [provider]  ferrous sulfate 325 (65 FE) MG tablet Take 1 tablet (325 mg total) by mouth 2 (two) times daily with a meal. 04/25/17 11/01/18  Pincus Large, DO  medroxyPROGESTERone (DEPO-PROVERA) 150 MG/ML injection Inject 1 mL (150 mg total) into the muscle every 3 (three) months. 07/26/17 04/22/19  Adam Phenix, MD      Allergies    Patient has no  known allergies.    Review of Systems   Review of Systems  Constitutional:  Positive for fever.  Gastrointestinal:  Positive for abdominal pain.  Neurological:  Positive for headaches.  All other systems reviewed and are negative.   Physical Exam Updated Vital Signs BP 122/75   Pulse 80   Temp 98.6 F (37 C) (Oral)   Resp 14   Ht 5\' 3"  (1.6 m)   Wt 54.4 kg   LMP 02/08/2023   SpO2 100%   BMI 21.26 kg/m  Physical Exam Vitals and nursing note reviewed.   Gen: Appears mildly uncomfortable Eyes: PERRL, EOMI HEENT: no oropharyngeal swelling Neck: trachea midline, no meningismus Resp: clear to auscultation  bilaterally Card: RRR, no murmurs, rubs, or gallops Abd: Left upper quadrant tenderness and left CVA tenderness noted Extremities: no calf tenderness, no edema Vascular: 2+ radial pulses bilaterally, 2+ DP pulses bilaterally Skin: no rashes Psyc: acting appropriately   ED Results / Procedures / Treatments   Labs (all labs ordered are listed, but only abnormal results are displayed) Labs Reviewed  COMPREHENSIVE METABOLIC PANEL - Abnormal; Notable for the following components:      Result Value   Sodium 133 (*)    Potassium 3.3 (*)    Glucose, Bld 106 (*)    Calcium 8.8 (*)    All other components within normal limits  CBC WITH DIFFERENTIAL/PLATELET - Abnormal; Notable for the following components:   Hemoglobin 11.3 (*)    All other components within normal limits  CULTURE, BLOOD (ROUTINE X 2)  CULTURE, BLOOD (ROUTINE X 2)  I-STAT CG4 LACTIC ACID, ED  I-STAT CG4 LACTIC ACID, ED    EKG None  Radiology CT ABDOMEN PELVIS W CONTRAST Result Date: 03/16/2023 CLINICAL DATA:  Acute abdominal for 3 days.  Vomiting.  Fever. EXAM: CT ABDOMEN AND PELVIS WITH CONTRAST TECHNIQUE: Multidetector CT imaging of the abdomen and pelvis was performed using the standard protocol following bolus administration of intravenous contrast. RADIATION DOSE REDUCTION: This exam was performed according to the departmental dose-optimization program which includes automated exposure control, adjustment of the mA and/or kV according to patient size and/or use of iterative reconstruction technique. CONTRAST:  OMNIPAQUE IOHEXOL 300 MG/ML  SOLN COMPARISON:  02/20/2017 FINDINGS: Lower Chest: No acute findings. Hepatobiliary:  No suspicious hepatic masses identified. Pancreas:  No mass or inflammatory changes. Spleen: Within normal limits in size and appearance. Adrenals/Urinary Tract: Several poorly defined areas of low-attenuation are seen the left renal parenchyma, suspicious for pyelonephritis. No evidence of  ureteral calculi or hydronephrosis. Stomach/Bowel: No evidence of obstruction, inflammatory process or abnormal fluid collections. Normal appendix. Vascular/Lymphatic: No pathologically enlarged lymph nodes. No acute vascular findings. Reproductive:  No mass or other significant abnormality. Other:  None. Musculoskeletal:  No suspicious bone lesions identified. IMPRESSION: Several ill-defined areas of decreased enhancement in left kidney, highly suspicious for pyelonephritis. No evidence of ureteral calculi or hydronephrosis. Electronically Signed   By: Danae Orleans M.D.   On: 03/16/2023 16:03   DG Chest 2 View Result Date: 03/16/2023 CLINICAL DATA:  Shortness of breath and chest pain EXAM: CHEST - 2 VIEW COMPARISON:  Chest x-ray January 18, 2020 FINDINGS: The cardiomediastinal silhouette is unchanged in contour. No focal pulmonary opacity. No pleural effusion or pneumothorax. The visualized upper abdomen is unremarkable. No acute osseous abnormality. IMPRESSION: No active cardiopulmonary disease. Electronically Signed   By: Jacob Moores M.D.   On: 03/16/2023 15:24    Procedures Procedures    Medications Ordered  in ED Medications  oxyCODONE-acetaminophen (PERCOCET/ROXICET) 5-325 MG per tablet 1 tablet (has no administration in time range)  acetaminophen (TYLENOL) tablet 1,000 mg (1,000 mg Oral Given 03/17/23 1727)  morphine (PF) 4 MG/ML injection 4 mg (4 mg Intravenous Given 03/17/23 1739)  ondansetron (ZOFRAN) injection 4 mg (4 mg Intravenous Given 03/17/23 1736)  ketorolac (TORADOL) 15 MG/ML injection 15 mg (15 mg Intravenous Given 03/17/23 1736)    ED Course/ Medical Decision Making/ A&P                                 Medical Decision Making 32 year old female with recently diagnosed pyelonephritis presenting to the emergency department today with fever, recurrent flank pain, and headache.  I will give patient morphine Zofran for symptoms.  Will give her Tylenol for headache as well  as Toradol given her reassuring workup yesterday.  I will repeat labs Wels a lactic acid to the further evaluate for sepsis.  I will reevaluate for ultimate disposition.  Will hold off on repeat imaging as she just had a CT scan performed yesterday which did not show any ureteral stones or obstructive findings.  The patient's labs here are reassuring.  Lactic acid is within normal limits.  The patient's vital signs are stable.  She is feeling little better on reassessment after being treated with antipyretics.  She is given a dose of Percocet here.  She is to bring home.  I will send the patient home with pain medications here.  Her initial urine cultures are growing back gram-negative rods.  Looking back through her most recent urine culture it looks like this did grow back E. coli that was pansensitive.  I think that this is okay to continue the cefpodoxime.  She will be discharged with return precautions.  Risk Prescription drug management.           Final Clinical Impression(s) / ED Diagnoses Final diagnoses:  Pyelonephritis    Rx / DC Orders ED Discharge Orders          Ordered    oxyCODONE-acetaminophen (PERCOCET/ROXICET) 5-325 MG tablet  Every 6 hours PRN        03/17/23 1933    ondansetron (ZOFRAN) 4 MG tablet  Every 8 hours PRN        03/17/23 1933    ketorolac (TORADOL) 10 MG tablet  Every 6 hours PRN        03/17/23 1933              Durwin Glaze, MD 03/17/23 1934

## 2023-03-18 LAB — URINE CULTURE: Culture: >=100000 — AB

## 2023-03-19 LAB — BLOOD CULTURE ID PANEL (REFLEXED) - BCID2

## 2023-03-19 NOTE — ED Notes (Signed)
Micro called stating one out of 4 bottles for cultures was +gram cocci staph epidermis. Dr Tegeler aware to follow up.

## 2023-03-20 NOTE — ED Notes (Signed)
03/20/23  7829 Spoke with pt whom states she still feels bad. She did have one out of 4 bottles on blood cultures + Dr Rush Landmark asked for her to be called and if not better to return to the ED. She is in agreement to return today.

## 2023-03-21 LAB — CULTURE, BLOOD (ROUTINE X 2)
Culture  Setup Time: NO GROWTH
Special Requests: ADEQUATE

## 2023-03-22 LAB — CULTURE, BLOOD (ROUTINE X 2)
Culture: NO GROWTH
Special Requests: ADEQUATE

## 2023-06-27 ENCOUNTER — Ambulatory Visit (HOSPITAL_COMMUNITY): Payer: Self-pay

## 2023-06-28 ENCOUNTER — Ambulatory Visit (HOSPITAL_COMMUNITY)
Admission: EM | Admit: 2023-06-28 | Discharge: 2023-06-28 | Disposition: A | Discharge status: Home / Self Care | Attending: Nurse Practitioner | Admitting: Nurse Practitioner

## 2023-06-28 ENCOUNTER — Other Ambulatory Visit: Payer: Self-pay

## 2023-06-28 ENCOUNTER — Encounter (HOSPITAL_COMMUNITY): Payer: Self-pay | Admitting: *Deleted

## 2023-06-28 DIAGNOSIS — Z8619 Personal history of other infectious and parasitic diseases: Secondary | ICD-10-CM | POA: Insufficient documentation

## 2023-06-28 DIAGNOSIS — Z202 Contact with and (suspected) exposure to infections with a predominantly sexual mode of transmission: Secondary | ICD-10-CM | POA: Diagnosis present

## 2023-06-28 DIAGNOSIS — N898 Other specified noninflammatory disorders of vagina: Secondary | ICD-10-CM | POA: Diagnosis present

## 2023-06-28 DIAGNOSIS — I10 Essential (primary) hypertension: Secondary | ICD-10-CM | POA: Insufficient documentation

## 2023-06-28 LAB — POCT URINE PREGNANCY: Preg Test, Ur: NEGATIVE

## 2023-06-28 MED ORDER — CEFTRIAXONE SODIUM 1 G IJ SOLR
500.0000 mg | Freq: Once | INTRAMUSCULAR | Status: AC
  Administered 2023-06-28: 500 mg via INTRAMUSCULAR

## 2023-06-28 MED ORDER — STERILE WATER FOR INJECTION IJ SOLN
INTRAMUSCULAR | Status: AC
  Filled 2023-06-28: qty 10

## 2023-06-28 MED ORDER — CEFTRIAXONE SODIUM 500 MG IJ SOLR
INTRAMUSCULAR | Status: AC
  Filled 2023-06-28: qty 500

## 2023-06-28 MED ORDER — VALACYCLOVIR HCL 1 G PO TABS: 2000.0000 mg | ORAL_TABLET | Freq: Two times a day (BID) | ORAL | 0 refills | Status: DC

## 2023-06-28 MED ORDER — DOXYCYCLINE HYCLATE 100 MG PO CAPS: 100.0000 mg | ORAL_CAPSULE | Freq: Two times a day (BID) | ORAL | 0 refills | Status: AC

## 2023-06-28 NOTE — Discharge Instructions (Addendum)
 Because of your reported exposure, you have been swabbed for common sexually transmitted infections known as gonorrhea, chlamydia, and trichomonas uses.  You have been treated for both gonorrhea and chlamydia today.  Refrain from any sexual activity until cleared completing your course of treatment-also notify your partner of any positive testing results.  Recommend consistent use of condoms and protection for any sexual encounters.  For any blood/serum testing, would recommend following up with your local health department for further testing. A prescription for Valtrex has been sent to the pharmacy to help with any cold sores that may be developing.  Avoid any oral sexual contact which could spread this virus when sores are present Your blood pressure consistently remains elevated-it is very important for you to take medication on a routine basis.  I have placed information in your discharge paperwork for you to call and schedule an appointment for follow-up care.
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
## 2023-06-28 NOTE — ED Triage Notes (Signed)
 PT reports she has a new sex partner and has a vag.discharge with odor. Pt also reports new partner contacted her with info on positive STD.

## 2023-06-29 ENCOUNTER — Telehealth (HOSPITAL_COMMUNITY): Payer: Self-pay

## 2023-06-29 LAB — CERVICOVAGINAL ANCILLARY ONLY
Bacterial Vaginitis (gardnerella): POSITIVE — AB
Candida Glabrata: NEGATIVE
Candida Vaginitis: NEGATIVE
Chlamydia: NEGATIVE
Comment: NEGATIVE
Comment: NEGATIVE
Comment: NEGATIVE
Comment: NEGATIVE
Comment: NEGATIVE
Comment: NORMAL
Neisseria Gonorrhea: NEGATIVE
Trichomonas: NEGATIVE

## 2023-06-29 MED ORDER — VALACYCLOVIR HCL 1 G PO TABS: 2000.0000 mg | ORAL_TABLET | Freq: Two times a day (BID) | ORAL | 0 refills | Status: AC

## 2023-06-29 MED ORDER — METRONIDAZOLE 500 MG PO TABS: 500.0000 mg | ORAL_TABLET | Freq: Two times a day (BID) | ORAL | 0 refills | Status: DC

## 2023-06-29 MED ORDER — METRONIDAZOLE 500 MG PO TABS: 500.0000 mg | ORAL_TABLET | Freq: Two times a day (BID) | ORAL | 0 refills | Status: AC

## 2023-06-29 NOTE — Telephone Encounter (Signed)
Pt requested pharmacy change.

## 2023-06-29 NOTE — Telephone Encounter (Signed)
 Per protocol, pt requires tx with metronidazole. Rx sent to pharmacy on file.

## 2023-06-29 NOTE — Telephone Encounter (Signed)
 Patient called and asked if the Valacyclovir be sent to the [facility] on [address]. RX is being sent. Patient informed and voiced understanding.

## 2023-06-29 NOTE — Addendum Note (Signed)
 Addended by: Warren Danes on: 06/29/2023 01:52 PM   Modules accepted: Orders

## 2023-08-10 ENCOUNTER — Emergency Department (HOSPITAL_COMMUNITY): Payer: Self-pay

## 2023-08-10 ENCOUNTER — Other Ambulatory Visit: Payer: Self-pay

## 2023-08-10 ENCOUNTER — Emergency Department (HOSPITAL_COMMUNITY)
Admission: EM | Admit: 2023-08-10 | Discharge: 2023-08-10 | Disposition: A | Discharge status: Home / Self Care | Payer: Self-pay | Attending: Emergency Medicine | Admitting: Emergency Medicine

## 2023-08-10 DIAGNOSIS — I10 Essential (primary) hypertension: Secondary | ICD-10-CM | POA: Insufficient documentation

## 2023-08-10 DIAGNOSIS — R0602 Shortness of breath: Secondary | ICD-10-CM | POA: Insufficient documentation

## 2023-08-10 DIAGNOSIS — S46911A Strain of unspecified muscle, fascia and tendon at shoulder and upper arm level, right arm, initial encounter: Secondary | ICD-10-CM | POA: Insufficient documentation

## 2023-08-10 DIAGNOSIS — F17210 Nicotine dependence, cigarettes, uncomplicated: Secondary | ICD-10-CM | POA: Insufficient documentation

## 2023-08-10 DIAGNOSIS — X58XXXA Exposure to other specified factors, initial encounter: Secondary | ICD-10-CM | POA: Insufficient documentation

## 2023-08-10 DIAGNOSIS — Z79899 Other long term (current) drug therapy: Secondary | ICD-10-CM | POA: Insufficient documentation

## 2023-08-10 DIAGNOSIS — R0789 Other chest pain: Secondary | ICD-10-CM | POA: Insufficient documentation

## 2023-08-10 LAB — URINALYSIS, ROUTINE W REFLEX MICROSCOPIC
Bilirubin Urine: NEGATIVE
Glucose, UA: NEGATIVE mg/dL
Ketones, ur: NEGATIVE mg/dL
Leukocytes,Ua: NEGATIVE
Nitrite: NEGATIVE
Protein, ur: NEGATIVE mg/dL
Specific Gravity, Urine: 1.015 (ref 1.005–1.030)
pH: 5 (ref 5.0–8.0)

## 2023-08-10 LAB — CBC
HCT: 34.5 % — ABNORMAL LOW (ref 36.0–46.0)
Hemoglobin: 10.8 g/dL — ABNORMAL LOW (ref 12.0–15.0)
MCH: 26.6 pg (ref 26.0–34.0)
MCHC: 31.3 g/dL (ref 30.0–36.0)
MCV: 85 fL (ref 80.0–100.0)
Platelets: 338 10*3/uL (ref 150–400)
RBC: 4.06 MIL/uL (ref 3.87–5.11)
RDW: 15.6 % — ABNORMAL HIGH (ref 11.5–15.5)
WBC: 3.5 10*3/uL — ABNORMAL LOW (ref 4.0–10.5)
nRBC: 0 % (ref 0.0–0.2)

## 2023-08-10 LAB — BASIC METABOLIC PANEL WITH GFR
Anion gap: 7 (ref 5–15)
BUN: 12 mg/dL (ref 6–20)
CO2: 23 mmol/L (ref 22–32)
Calcium: 9 mg/dL (ref 8.9–10.3)
Chloride: 108 mmol/L (ref 98–111)
Creatinine, Ser: 0.79 mg/dL (ref 0.44–1.00)
GFR, Estimated: >60 mL/min (ref >60)
Glucose, Bld: 79 mg/dL (ref 70–99)
Potassium: 3.4 mmol/L — ABNORMAL LOW (ref 3.5–5.1)
Sodium: 138 mmol/L (ref 135–145)

## 2023-08-10 LAB — TROPONIN I (HIGH SENSITIVITY): Troponin I (High Sensitivity): 3 ng/L (ref <18)

## 2023-08-10 LAB — HCG, SERUM, QUALITATIVE: Preg, Serum: NEGATIVE

## 2023-08-10 MED ORDER — KETOROLAC TROMETHAMINE 15 MG/ML IJ SOLN
15.0000 mg | Freq: Once | INTRAMUSCULAR | Status: AC
  Administered 2023-08-10: 15 mg via INTRAMUSCULAR
  Filled 2023-08-10: qty 1

## 2023-08-10 MED ORDER — CYCLOBENZAPRINE HCL 5 MG PO TABS: 5.0000 mg | ORAL_TABLET | Freq: Three times a day (TID) | ORAL | 0 refills | Status: DC | PRN

## 2023-08-10 MED ORDER — AMLODIPINE BESYLATE 5 MG PO TABS: 5.0000 mg | ORAL_TABLET | Freq: Every day | ORAL | 5 refills | Status: DC

## 2023-08-10 MED ORDER — HYDROXYZINE HCL 25 MG PO TABS: 25.0000 mg | ORAL_TABLET | Freq: Three times a day (TID) | ORAL | 0 refills | Status: DC | PRN

## 2023-08-10 MED ORDER — AMLODIPINE BESYLATE 5 MG PO TABS: 5.0000 mg | ORAL_TABLET | Freq: Every day | ORAL | 5 refills | Status: AC

## 2023-08-10 MED ORDER — AMLODIPINE BESYLATE 5 MG PO TABS
5.0000 mg | ORAL_TABLET | Freq: Once | ORAL | Status: AC
  Administered 2023-08-10: 5 mg via ORAL
  Filled 2023-08-10: qty 1

## 2023-08-10 NOTE — Discharge Instructions (Addendum)
 Your chest and shoulder pain may be related to muscle strain. You were treated with an injection of Toradol  for pain. -Starting tomorrow evening, you can take over-the-counter ibuprofen  400 to 600 mg every 6 hours as needed for pain and inflammation. -A prescription for muscle relaxer called Flexeril  has been sent to your pharmacy.  You can take this 3 times a day as needed.  It can make you feel groggy/tired, do not take before driving  - A prescription for Atarax  for anxiety has been sent to the pharmacy, you can take this 3 times a day as needed.  Do not take this with the muscle relaxer as it can also make you tired. - For hypertension, a prescription for amlodipine  5 mg has been sent to the pharmacy.  After 2 weeks, if your blood pressure remains greater than 140/90 you can increase the dose to 10 mg daily.   - I recommend establishing care with a primary care doctor to follow-up on these issues - If your symptoms change or worsen, especially if you develop shortness of breath, fast heart rate, lightheadedness please see a PCP or return to the ED for further evaluation

## 2023-08-10 NOTE — ED Provider Notes (Incomplete)
 Patient is a 33 year old female presenting today with complaints of chest pain in the center and left lower chest and in her right shoulder blade.  Symptoms woke her up at 3 AM this morning.  No significant shortness of breath, no GERD like symptoms nausea or vomiting.  She did not eat right before going to bed.  She does smoke cigarettes but denies recent cough or infectious symptoms.  She has a history of hypertension and has been off medication for at least a month and does not have a PCP.  Patient is otherwise well-appearing.  I independently interpreted her EKG and labs.  EKG is normal without evidence of dysrhythmia or ST changes.  CBC with minimal leukopenia of 3.5 but stable hemoglobin of 10.8, BMP without acute findings, troponin is normal and symptoms have been going on for greater than 6 hours so feel that 1 troponin is sufficient.  Pregnancy test is negative.  I have independently visualized and interpreted pt's images today. Chest x-ray without acute findings.  Suspect patient's pain is most likely musculoskeletal.  Low concern for PE, dissection, GI pathology.  ACS unlikely given patient's symptoms and negative troponin and EKG.  Patient treated for her uncontrolled hypertension, transition of care consult placed for PCP follow-up.   Almond Army, MD 08/14/23 [phone]

## 2023-08-10 NOTE — ED Triage Notes (Signed)
 Pt. Stated, I had an episode of chest pressure and right shoulder pain last night and has carried on this morning. Ive had kidney infections but it was not like this.

## 2023-08-10 NOTE — ED Provider Notes (Signed)
 [facility] Provider Note   CSN: [id number] Arrival date & time: 08/10/23  7829     History HTN, depression, anxiety, tobacco use Chief Complaint  Patient presents with   Chest Pain   Shoulder Pain    Sharon Johnston is a 33 y.o. female.  33 year old female with PMH significant for HTN, depression,Tobacco use presents with chest and right shoulder pain.  They have constant chest and right shoulder pain since approximately 3:00 AM, described as a 'poking' sensation. The pain is severe enough to disrupt sleep and is not activity-related. No previous similar pain is noted.  Shortness of breath occurred last night, resolved and has not reoccurred.  They deny nausea, vomiting, chills, fever, cough, blurry vision, or lightheadedness.  Palpitations, described as a hard and fast heartbeat, occurred yesterday and this morning but not currently.  She denies any leg pain, recent surgeries or immobilization.  They have hypertension and have been prescribed amlodipine  but are not currently taking any antihypertensives as they do not have a PCP. They experience stress and anxiety, with a history of panic attacks that include shortness of breath, fast heart rate, chest pain.  They state they have been very anxious recently.   Chest Pain Shoulder Pain      Home Medications Prior to Admission medications   Medication Sig Start Date End Date Taking? Authorizing Provider  cyclobenzaprine  (FLEXERIL ) 5 MG tablet Take 1 tablet (5 mg total) by mouth 3 (three) times daily as needed for muscle spasms. 08/10/23  Yes Glenn Lange, DO  hydrOXYzine  (ATARAX ) 25 MG tablet Take 1 tablet (25 mg total) by mouth 3 (three) times daily as needed for anxiety. 08/10/23  Yes Glenn Lange, DO  amLODipine  (NORVASC ) 5 MG tablet Take 1 tablet (5 mg total) by mouth daily. 08/10/23   Glenn Lange, DO  Blood Pressure Monitoring (BLOOD PRESSURE KIT) DEVI 1 kit by Does not apply route  as needed. 08/30/22   Tresia Fruit, MD  Misc. Devices (GOJJI WEIGHT SCALE) MISC 1 Device by Does not apply route as needed. Patient not taking: Reported on 04/22/2021 12/24/20   Ervin, Michael L, MD  ferrous sulfate  325 (65 FE) MG tablet Take 1 tablet (325 mg total) by mouth 2 (two) times daily with a meal. 04/25/17 11/01/18  Phelps, Jazma Y, DO  medroxyPROGESTERone  (DEPO-PROVERA ) 150 MG/ML injection Inject 1 mL (150 mg total) into the muscle every 3 (three) months. 07/26/17 04/22/19  Tresia Fruit, MD      Allergies    Patient has no known allergies.    Review of Systems   Review of Systems  Cardiovascular:  Positive for chest pain.  As in HPI  Physical Exam Updated Vital Signs BP (!) 174/114 (BP Location: Right Arm)   Pulse 89   Temp 98.5 F (36.9 C)   Resp 16   Ht 5\' 3"  (1.6 m)   Wt 60.3 kg   LMP 08/07/2023   SpO2 100%   BMI 23.56 kg/m  Physical Exam Constitutional:      General: She is not in acute distress.    Appearance: She is not ill-appearing.  HENT:     Mouth/Throat:     Mouth: Mucous membranes are moist.  Eyes:     Conjunctiva/sclera: Conjunctivae normal.  Cardiovascular:     Rate and Rhythm: Normal rate and regular rhythm.     Pulses: Normal pulses.     Heart sounds: No murmur heard. Pulmonary:  Effort: Pulmonary effort is normal. No respiratory distress.     Breath sounds: Normal breath sounds.  Abdominal:     General: Abdomen is flat. Bowel sounds are normal. There is no distension.     Palpations: Abdomen is soft.     Tenderness: There is no abdominal tenderness.  Musculoskeletal:     Right shoulder: No swelling, deformity or effusion.     Cervical back: Normal range of motion and neck supple.     Right lower leg: No edema.     Left lower leg: No edema.     Comments: Tension and TTP of right trapezius Mild TTP of sternum  Skin:    General: Skin is warm and dry.  Neurological:     General: No focal deficit present.     Mental Status: She is alert.   Psychiatric:        Mood and Affect: Mood normal.        Behavior: Behavior normal.     ED Results / Procedures / Treatments   Labs (all labs ordered are listed, but only abnormal results are displayed) Labs Reviewed  BASIC METABOLIC PANEL WITH GFR - Abnormal; Notable for the following components:      Result Value   Potassium 3.4 (*)    All other components within normal limits  CBC - Abnormal; Notable for the following components:   WBC 3.5 (*)    Hemoglobin 10.8 (*)    HCT 34.5 (*)    RDW 15.6 (*)    All other components within normal limits  URINALYSIS, ROUTINE W REFLEX MICROSCOPIC - Abnormal; Notable for the following components:   Hgb urine dipstick LARGE (*)    Bacteria, UA RARE (*)    All other components within normal limits  HCG, SERUM, QUALITATIVE  TROPONIN I (HIGH SENSITIVITY)  TROPONIN I (HIGH SENSITIVITY)    EKG EKG Interpretation Date/Time:  Thursday Aug 10 2023 07:29:42 EDT Ventricular Rate:  85 PR Interval:  162 QRS Duration:  82 QT Interval:  390 QTC Calculation: 464 R Axis:   -72  Text Interpretation: Normal sinus rhythm Left axis deviation Pulmonary disease pattern No significant change since last tracing When compared with ECG of 07-Sep-2016 05:19, PREVIOUS ECG IS PRESENT Confirmed by Almond Army ([id number]) on 08/10/2023 8:58:49 AM  Radiology DG Chest 2 View Result Date: 08/10/2023 CLINICAL DATA:  Chest pain. EXAM: CHEST - 2 VIEW COMPARISON:  March 16, 2023. FINDINGS: The heart size and mediastinal contours are within normal limits. Both lungs are clear. The visualized skeletal structures are unremarkable. IMPRESSION: No active cardiopulmonary disease. Electronically Signed   By: Rosalene Colon M.D.   On: 08/10/2023 08:05    Procedures Procedures    Medications Ordered in ED Medications  ketorolac  (TORADOL ) 15 MG/ML injection 15 mg (has no administration in time range)  amLODipine  (NORVASC ) tablet 5 mg (has no administration in time  range)    ED Course/ Medical Decision Making/ A&P   {                                Medical Decision Making 33 year old female with PMH significant for HTN, anxiety, depression, and tobacco use presents with acute right shoulder and chest pain without any known exacerbating factors. In ED, vitals significant for elevated blood pressure and otherwise stable. EKG nonconcerning for MI without ST changes and troponin normal at 3.  CXR normal. Labs significant for  mild anemia, CBC and BMP otherwise nonconcerning, UA negative for UTI and hCG negative. Low concern for MI given low troponin and no evidence on EKG.  Low concern for PE given chest pain is not worsened with breathing, no provoking factors, she is not tachycardic, no evidence of DVT with Wells score of 0.  Exam is significant for reproducible anterior wall chest pain and tension of right trapezius muscle.  Pain is most likely musculoskeletal in nature and could also be related to anxiety as noted in HPI. Significantly elevated blood pressure in setting of pain and medication noncompliance.  No evidence of endorgan damage.   Patient remained stable throughout ED visit and did not require admission.  She was discharged with prescription for amlodipine  5 mg daily with instructions to increase to 10 mg daily if BP remains elevated after 2 weeks.  She was also given Rx Flexeril  and advised to take OTC ibuprofen  as needed for MSK pain and given Rx Atarax  to use as needed for anxiety.  She was advised not to take Atarax  and Flexeril  together. She was given resources to establish care with PCP to follow-up on these issues and also follow-up on mild anemia. She instructed to return if symptoms worsen or new symptoms develop.  Amount and/or Complexity of Data Reviewed Labs: ordered. Radiology: ordered.  Risk Prescription drug management.     Final Clinical Impression(s) / ED Diagnoses Final diagnoses:  Strain of right shoulder, initial  encounter  Chest wall pain    Rx / DC Orders ED Discharge Orders          Ordered    amLODipine  (NORVASC ) 5 MG tablet  Daily,   Status:  Discontinued        08/10/23 0929    cyclobenzaprine  (FLEXERIL ) 5 MG tablet  3 times daily PRN        08/10/23 0944    hydrOXYzine  (ATARAX ) 25 MG tablet  3 times daily PRN        08/10/23 0944    amLODipine  (NORVASC ) 5 MG tablet  Daily        08/10/23 0944              Glenn Lange, DO 08/10/23 1023    Almond Army, MD 08/14/23 [phone]

## 2023-09-01 ENCOUNTER — Ambulatory Visit (HOSPITAL_COMMUNITY)
Admission: EM | Admit: 2023-09-01 | Discharge: 2023-09-01 | Disposition: A | Discharge status: Home / Self Care | Payer: Self-pay | Attending: Emergency Medicine | Admitting: Emergency Medicine

## 2023-09-01 ENCOUNTER — Encounter (HOSPITAL_COMMUNITY): Payer: Self-pay | Admitting: Emergency Medicine

## 2023-09-01 DIAGNOSIS — B9689 Other specified bacterial agents as the cause of diseases classified elsewhere: Secondary | ICD-10-CM | POA: Insufficient documentation

## 2023-09-01 DIAGNOSIS — N898 Other specified noninflammatory disorders of vagina: Secondary | ICD-10-CM

## 2023-09-01 DIAGNOSIS — Z113 Encounter for screening for infections with a predominantly sexual mode of transmission: Secondary | ICD-10-CM | POA: Insufficient documentation

## 2023-09-01 DIAGNOSIS — N760 Acute vaginitis: Secondary | ICD-10-CM | POA: Insufficient documentation

## 2023-09-01 DIAGNOSIS — L209 Atopic dermatitis, unspecified: Secondary | ICD-10-CM | POA: Insufficient documentation

## 2023-09-01 DIAGNOSIS — A6000 Herpesviral infection of urogenital system, unspecified: Secondary | ICD-10-CM | POA: Insufficient documentation

## 2023-09-01 HISTORY — DX: Herpesviral infection, unspecified: B00.9

## 2023-09-01 LAB — POCT URINALYSIS DIP (MANUAL ENTRY)
Bilirubin, UA: NEGATIVE
Glucose, UA: NEGATIVE mg/dL
Ketones, POC UA: NEGATIVE mg/dL
Leukocytes, UA: NEGATIVE
Nitrite, UA: POSITIVE — AB
Protein Ur, POC: NEGATIVE mg/dL
Spec Grav, UA: 1.02 (ref 1.010–1.025)
Urobilinogen, UA: 1 U/dL
pH, UA: 6.5 (ref 5.0–8.0)

## 2023-09-01 LAB — POCT URINE PREGNANCY: Preg Test, Ur: NEGATIVE

## 2023-09-01 MED ORDER — METRONIDAZOLE 500 MG PO TABS: 500.0000 mg | ORAL_TABLET | Freq: Two times a day (BID) | ORAL | 0 refills | Status: AC

## 2023-09-01 MED ORDER — TRIAMCINOLONE ACETONIDE 0.1 % EX CREA: 1.0000 | TOPICAL_CREAM | Freq: Two times a day (BID) | CUTANEOUS | 0 refills | Status: DC

## 2023-09-01 MED ORDER — VALACYCLOVIR HCL 500 MG PO TABS: 500.0000 mg | ORAL_TABLET | Freq: Two times a day (BID) | ORAL | 0 refills | Status: AC

## 2023-09-01 NOTE — Discharge Instructions (Addendum)
 We will call you if anything on your swab returns positive. You can also see these results on MyChart. Please abstain from sexual intercourse until your results return. I am treating you for BV with flagyl  twice daily for 7 days. Take with food to avoid upset stomach. Do not drink alcohol while taking.  I am treating you for herpes outbreak with valtrex . Take 1 tablet in the morning, and 1 tablet at night, for 5 days in a row.  I have also sent you a steroid cream Kenalog  to use twice daily on the itching rash. Do not use for longer than 2 weeks in a row  Your urine is inconclusive for infection. Since you are not having urinary symptoms, I am going to send this out for culture, and we will call you in 1-3 days if there is any treatment needed.

## 2023-09-01 NOTE — ED Triage Notes (Signed)
 Pt reports having vaginal discharge that is clear and watery for 3 days.  Reports has herpes and having an outbreak.  Reports that has eczema for 2 weeks and needs a cream to help with it.
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
## 2023-09-03 ENCOUNTER — Ambulatory Visit: Payer: Self-pay | Admitting: Emergency Medicine

## 2023-09-03 LAB — URINE CULTURE: Culture: >=100000 — AB

## 2023-09-03 MED ORDER — SULFAMETHOXAZOLE-TRIMETHOPRIM 800-160 MG PO TABS: 1.0000 | ORAL_TABLET | Freq: Two times a day (BID) | ORAL | 0 refills | Status: DC

## 2023-09-04 ENCOUNTER — Ambulatory Visit (INDEPENDENT_AMBULATORY_CARE_PROVIDER_SITE_OTHER): Payer: Self-pay | Admitting: Clinical

## 2023-09-04 ENCOUNTER — Encounter (HOSPITAL_COMMUNITY): Payer: Self-pay

## 2023-09-04 DIAGNOSIS — F331 Major depressive disorder, recurrent, moderate: Secondary | ICD-10-CM | POA: Diagnosis not present

## 2023-09-04 LAB — CERVICOVAGINAL ANCILLARY ONLY
Bacterial Vaginitis (gardnerella): POSITIVE — AB
Candida Glabrata: NEGATIVE
Candida Vaginitis: NEGATIVE
Chlamydia: POSITIVE — AB
Comment: NEGATIVE
Comment: NEGATIVE
Comment: NEGATIVE
Comment: NEGATIVE
Comment: NEGATIVE
Comment: NORMAL
Neisseria Gonorrhea: NEGATIVE
Trichomonas: NEGATIVE

## 2023-09-04 MED ORDER — DOXYCYCLINE HYCLATE 100 MG PO TABS: 100.0000 mg | ORAL_TABLET | Freq: Two times a day (BID) | ORAL | 0 refills | Status: DC

## 2023-09-04 NOTE — Progress Notes (Signed)
 THERAPIST PROGRESS NOTE  Session Time: 25 minutes  Participation Level: Active  Behavioral Response: CasualAlertEuthymic  Type of Therapy: Individual Therapy  Treatment Goals addressed: Client will attend and engage in at least 80% of scheduled individual psychotherapy sessions  ProgressTowards Goals: Progressing  Interventions: CBT and Supportive  Summary:  Chanci ABERDEEN HAFEN is a 33 y.o. female who presents with a scheduled appointment oriented x 5, appropriately dressed, and cooperative.  Client denied hallucinations and delusions. Client reported on today she has been doing better since she was last seen.  Client reported the last time she met with the therapist she was in a hotel room with her kids.  Client reported she has been approved for section 8 housing and she will go to get her voucher on tomorrow.  Client reported she also started a job at [employer].  Client reported since her kids are out of school her 2 daughters are in [place]  with their fathers mother.  Client reported her son is with her.  Client reported social services is still involved.  Client reported she is eager to continue finishing her case so she can close it out.  Client reported they have been involved since 2018 on and off.  Client reported she is currently in progress of completing parenting classes as part of her case plan and that is going well.  Client reported she is hoping with the progress that she has made they will be able to close out her case with DSS.  Client reported while things are found in place that she still has her days with depression and anxiety.  Client reported it is not as frequent but she does have an appointment with a psychiatrist in early July with the [facility]. Evidence of progress towards goal: Client's GAD-7 score and PHQ-9 scores are less than 10.     09/04/2023    3:38 PM 03/10/2023   11:26 AM  GAD 7 : Generalized Anxiety Score  Nervous,  Anxious, on Edge 0 2  Control/stop worrying 0 2  Worry too much - different things 1 2  Trouble relaxing 1 2  Restless 0 3  Easily annoyed or irritable 0 2  Afraid - awful might happen 0 2  Total GAD 7 Score 2 15  Anxiety Difficulty Somewhat difficult Somewhat difficult     Flowsheet Row Counselor from 09/04/2023 in [facility]  PHQ-9 Total Score 1     Suicidal/Homicidal: Nowithout intent/plan  Therapist Response:  Therapist began the appointment asking the client how she has been doing since last seen. Therapist engaged with active listening and positive emotional support. Therapist used CBT to ask client to identify positive and negative changes with psychosocial stressors. Therapist used CBT to positively reinforce the clients identified strengths of staying consistent to improve her stressors of employment and housing. Therapist used CBT to complete [facility] for depressive and anxiety symptoms currently. Therapist used CBT to continue to teach the client about making healthy choices to continue her improvement. Therapist used CBT ask the client to identify her progress with frequency of use with coping skills with continued practice in her daily activity.       Plan: Return again in 4 weeks.  Diagnosis: MDD, recurrent, moderate with anxious distress  Collaboration of Care: Other therapist informed administrative staff that she needs to fill out a release of information form for her Child psychotherapist.  Patient/Guardian was advised Release of Information must be obtained  prior to any record release in order to collaborate their care with an outside provider. Patient/Guardian was advised if they have not already done so to contact the registration department to sign all necessary forms in order for us  to release information regarding their care.   Consent: Patient/Guardian gives verbal consent for treatment and assignment of benefits for services provided  during this visit. Patient/Guardian expressed understanding and agreed to proceed.   Wei Poplaski Y Keziah Avis, LCSW 09/04/2023

## 2023-09-05 MED ORDER — DOXYCYCLINE HYCLATE 100 MG PO TABS: 100.0000 mg | ORAL_TABLET | Freq: Two times a day (BID) | ORAL | 0 refills | Status: AC

## 2023-09-05 MED ORDER — SULFAMETHOXAZOLE-TRIMETHOPRIM 800-160 MG PO TABS: 1.0000 | ORAL_TABLET | Freq: Two times a day (BID) | ORAL | 0 refills | Status: AC

## 2023-09-05 NOTE — Telephone Encounter (Signed)
 Pt called requesting pharmacy change.   Verified identity using two identifiers.  Provided positive result.  Reviewed safe sex practices, notifying partners, and refraining from sexual activities for 7 days from time of treatment.  Patient verified understanding, all questions answered.

## 2023-09-22 ENCOUNTER — Encounter (HOSPITAL_COMMUNITY): Payer: Self-pay

## 2023-09-25 NOTE — Progress Notes (Unsigned)
 Psychiatric Initial Adult Assessment  Patient Identification: Sharon Johnston MRN:  [id number] Date of Evaluation:  09/25/2023 Referral Source: ***  Assessment:  Sharon Johnston is a 33 y.o. female with a history of depression and prior suicide attempt requiring hospitalization in 2010, who presents in person to [facility] for initial evaluation of ***.  Patient reports ***  The patient's presentation is most consistent with a principal diagnosis of ***, as evidenced by ***.   Relevant Chart Review Notes/Encounters: recent workup/txt of GU complaint Labs/Imaging: UA, urine pregnancy, urine culture    Risk Assessment: A suicide and violence risk assessment was performed as part of this evaluation. There patient is deemed to be at chronic elevated risk for self-harm/suicide given the following factors: {SABSUICIDERISKFACTORS:29780}. These risk factors are mitigated by the following factors: {SABSUICIDEPROTECTIVEFACTORS:29779}. The patient is deemed to be at chronic elevated risk for violence given the following factors: {SABVIOLENCERISKFACTORS:29781}. These risk factors are mitigated by the following factors: {SABVIOLENCEPROTECTIVEFACTORS:29782}. There is no *** acute risk for suicide or violence at this time. The patient was educated about relevant modifiable risk factors including following recommendations for treatment of psychiatric illness and abstaining from substance abuse.  While future psychiatric events cannot be accurately predicted, the patient does not *** currently require  acute inpatient psychiatric care and does not *** currently meet [place]  involuntary commitment criteria.    Plan:  # *** Past medication trials:  Status of problem: new to me Interventions: -- ***  # *** Past medication trials:  Status of problem: new to me Interventions: -- ***  # *** Past medication trials:  Status of problem: new to me Interventions: -- ***  Health  Maintenance PCP: [place], [facility] @ No data recorded  Patient was given contact information for behavioral health clinic and was instructed to call 911 for emergencies.  Patient and plan of care will be discussed with the Attending MD ,Dr. ***, who agrees with the above statement and plan.   Subjective:  Chief Complaint: No chief complaint on file.   History of Present Illness:   *** The patient is here for ***. The patient reports a *** history of ***.  The patient's stressors include ***.   Sleep: *** Snoring: *** Caffeine : ***  Contraception: Planning on becoming pregnant:   Psychiatric ROS Depression: *** The patient denied any current symptoms of depression/***The patient reports a *** history of low mood and anhedonia, characterized by {Depression:32915}. Suicidal thoughts are ***. The patient denies any symptoms of ***.   Anxiety: *** The patient denied any current symptoms of anxiety/ ***The patient reports a *** history of excessive anxiety and worry about a number of events, characterized by {HJI:67083}.   Panic attacks: *** The patient denies any current or past history of panic attacks/*** The patient reports experiencing of intense surge of fear, in which the following symptoms develop: {Panic Attacks:32917}  Mania/Hypomania: *** Negative for any past or current symptoms of expansive energy or mood/ The patient reports a *** history of episodes of expansive energy and mood that can last up to *** days characterized by {MANIA:32922}    Last episode reported by patient: ***   PSTD: *** Negative for any history of trauma / ***The patient reports past exposure to ***.  Intrusions s/xs: ***  Hyperarousal s/xs: ***  Avoidance s/xs: ***  Negative effects on cognition and mood: ***  Eating Disorder: The patient denies any current or past history of disordered eating that includes  restrictive, binging, purging behaviors, or any other compensatory  behaviors / ***   IED: ***/ The patient reports a ** history of impulsive aggression characterized by {PZI:67078}  Psychosis: *** The patient denies any history of hallucinations, disorganized thinking, paranoia, or delusions. / ***    Safety: Active SI: ***Denied Passive SI: *** Access to firearms: *** Psychosis: as above  ROS   Past Psychiatric History:  Diagnoses: *** Medication trials: *** Previous psychiatrist/therapist: *** Hospitalizations: *** Suicide attempts: *** NSSIB Hx: *** Hx of violence towards others: *** Hx of trauma/abuse: ***  Substance Abuse History: Alcohol:  Hx of withdrawal seizures/Dts: *** Tobacco: *** Cannabis: *** Other Illicit Substance Use: IVDU: denied Detox Hx: *** Rehab Hx: ***  Past Medical History PCP: [place], [facility]  Dx: *** ALL: *** Patient has no known allergies.  Head trauma: *** Seizures: ***   Family Psychiatric History: *** Psychiatric disorders: *** Suicide hx: *** Homicide: ***  Social History: Living situation: *** Occupational status: *** Educational history: *** Marital Status: *** Children: *** Legal Hx: *** DUI/DWI: *** Military Hx: ***  Past Medical History:  Past Medical History:  Diagnosis Date   Chlamydia    Depression    hospitalized for 2 weeks after suicide att was post partum   Herpes    Hx of gonorrhea    Hypertension    gestational HTN   Kidney infection     Past Surgical History:  Procedure Laterality Date   NO PAST SURGERIES     TUBAL LIGATION N/A 06/24/2021   Procedure: POST PARTUM TUBAL LIGATION;  Surgeon: Lorence Ozell CROME, MD;  Location: [facility];  Service: Gynecology;  Laterality: N/A;    Family History:  Family History  Problem Relation Age of Onset   Hypertension Mother    Hypertension Maternal Grandmother    Healthy Father    Hypertension Maternal Aunt    Depression Cousin     Social History:   Social History   Socioeconomic History    Marital status: Single    Spouse name: Not on file   Number of children: 4   Years of education: diploma   Highest education level: Not on file  Occupational History   Not on file  Tobacco Use   Smoking status: Some Days    Types: Cigars   Smokeless tobacco: Never   Tobacco comments:    two black and milds per day  Vaping Use   Vaping status: Never Used  Substance and Sexual Activity   Alcohol use: No   Drug use: No   Sexual activity: Yes    Birth control/protection: None  Other Topics Concern   Not on file  Social History Narrative   ** Merged History Encounter **       Social Drivers of Corporate investment banker Strain: Not on file  Food Insecurity: Not on file  Transportation Needs: Not on file  Physical Activity: Not on file  Stress: Not on file  Social Connections: Not on file    Allergies:  No Known Allergies  Current Medications: Current Outpatient Medications  Medication Sig Dispense Refill   amLODipine  (NORVASC ) 5 MG tablet Take 1 tablet (5 mg total) by mouth daily. 30 tablet 5   Blood Pressure Monitoring (BLOOD PRESSURE KIT) DEVI 1 kit by Does not apply route as needed. 1 each 0   hydrOXYzine  (ATARAX ) 25 MG tablet Take 1 tablet (25 mg total) by mouth 3 (three) times daily as needed for  anxiety. 30 tablet 0   triamcinolone  cream (KENALOG ) 0.1 % Apply 1 Application topically 2 (two) times daily. Use no longer than 2 weeks in a row 30 g 0   No current facility-administered medications for this visit.     Objective: Psychiatric Specialty Exam: General Appearance: Casual, fairly groomed  Eye Contact:  Good    Speech:  Clear, coherent, normal rate, spontaneous  Volume:  Normal   Mood:  see above  Affect:  Appropriate, congruent, full range  Thought Content: Logical, rumination  ***  Suicidal Thoughts: see subjective  Thought Process:  Coherent, goal-directed, circumstantial ***  Orientation:  A&Ox4   Memory:  Immediate good  Judgment:  Fair    Insight:  Fair***  Concentration:  Attention and concentration good   Recall:  Good  Fund of Knowledge: Good  Language: Good, fluent  Psychomotor Activity: Normal  Akathisia:  NA   AIMS (if indicated): NA   Assets:   {Assets (PAA):22698}  ADL's:  Intact  Cognition: WNL  Sleep: see above  Appetite: see above    Physical Exam    Metabolic Disorder Labs: Lab Results  Component Value Date   HGBA1C 5.0 12/06/2016   No results found for: PROLACTIN No results found for: CHOL, TRIG, HDL, CHOLHDL, VLDL, LDLCALC Lab Results  Component Value Date   TSH 0.899 ***Test methodology is 3rd generation TSH*** 09/07/2008    Therapeutic Level Labs: No results found for: LITHIUM No results found for: CBMZ No results found for: VALPROATE   Marlo Masson, MD 7/7/20254:50 PM

## 2023-09-26 ENCOUNTER — Ambulatory Visit (HOSPITAL_COMMUNITY): Payer: Self-pay | Admitting: Student in an Organized Health Care Education/Training Program

## 2023-10-12 NOTE — Progress Notes (Signed)
Drug screen

## 2023-10-16 ENCOUNTER — Ambulatory Visit (HOSPITAL_COMMUNITY): Payer: Self-pay | Admitting: Clinical

## 2023-11-06 ENCOUNTER — Ambulatory Visit (HOSPITAL_COMMUNITY): Admitting: Clinical

## 2023-11-06 ENCOUNTER — Ambulatory Visit (HOSPITAL_COMMUNITY): Payer: Self-pay

## 2023-11-06 ENCOUNTER — Encounter (HOSPITAL_COMMUNITY): Payer: Self-pay

## 2023-11-14 ENCOUNTER — Encounter (HOSPITAL_COMMUNITY): Payer: Self-pay

## 2023-11-14 ENCOUNTER — Ambulatory Visit (HOSPITAL_COMMUNITY): Payer: Self-pay | Admitting: Clinical

## 2023-11-21 ENCOUNTER — Ambulatory Visit (HOSPITAL_COMMUNITY)
Admission: EM | Admit: 2023-11-21 | Discharge: 2023-11-21 | Disposition: A | Discharge status: Home / Self Care | Payer: Self-pay | Attending: Family Medicine | Admitting: Family Medicine

## 2023-11-21 DIAGNOSIS — N898 Other specified noninflammatory disorders of vagina: Secondary | ICD-10-CM

## 2023-11-21 DIAGNOSIS — A6000 Herpesviral infection of urogenital system, unspecified: Secondary | ICD-10-CM

## 2023-11-21 DIAGNOSIS — J029 Acute pharyngitis, unspecified: Secondary | ICD-10-CM

## 2023-11-21 DIAGNOSIS — F419 Anxiety disorder, unspecified: Secondary | ICD-10-CM

## 2023-11-21 DIAGNOSIS — Z3202 Encounter for pregnancy test, result negative: Secondary | ICD-10-CM

## 2023-11-21 LAB — POCT URINALYSIS DIP (MANUAL ENTRY)
Bilirubin, UA: NEGATIVE
Glucose, UA: NEGATIVE mg/dL
Ketones, POC UA: NEGATIVE mg/dL
Leukocytes, UA: NEGATIVE
Nitrite, UA: NEGATIVE
Protein Ur, POC: 30 mg/dL — AB
Spec Grav, UA: 1.025 (ref 1.010–1.025)
Urobilinogen, UA: 0.2 U/dL
pH, UA: 6 (ref 5.0–8.0)

## 2023-11-21 LAB — POCT URINE PREGNANCY: Preg Test, Ur: NEGATIVE

## 2023-11-21 LAB — POC SARS CORONAVIRUS 2 AG -  ED: SARS Coronavirus 2 Ag: NEGATIVE

## 2023-11-21 MED ORDER — VALACYCLOVIR HCL 1 G PO TABS: ORAL_TABLET | ORAL | 0 refills | Status: AC

## 2023-11-21 MED ORDER — HYDROXYZINE HCL 25 MG PO TABS: 25.0000 mg | ORAL_TABLET | Freq: Three times a day (TID) | ORAL | 0 refills | Status: DC | PRN

## 2023-11-21 NOTE — ED Triage Notes (Addendum)
 Patient c/o lower abdominal pain with a clear vaginal discharge, but is currently on her period.  Patient also c/o of a sore throat and no taste since yesterday.  Patient states she was positive for Herpes on a previous visit and was told to come back for a recheck. Patient states she also was told to come back for STD blood work .  Patient denies taking any medication for her symptoms.  Patient added that she needs an Atarax  refill.

## 2023-11-22 ENCOUNTER — Ambulatory Visit (HOSPITAL_COMMUNITY): Payer: Self-pay

## 2023-11-22 LAB — CERVICOVAGINAL ANCILLARY ONLY
Bacterial Vaginitis (gardnerella): POSITIVE — AB
Candida Glabrata: NEGATIVE
Candida Vaginitis: NEGATIVE
Chlamydia: NEGATIVE
Comment: NEGATIVE
Comment: NEGATIVE
Comment: NEGATIVE
Comment: NEGATIVE
Comment: NEGATIVE
Comment: NORMAL
Neisseria Gonorrhea: NEGATIVE
Trichomonas: NEGATIVE

## 2023-11-22 MED ORDER — METRONIDAZOLE 500 MG PO TABS
500.0000 mg | ORAL_TABLET | Freq: Two times a day (BID) | ORAL | 0 refills | Status: AC
Start: 2023-11-22 — End: 2023-11-29

## 2023-11-22 NOTE — ED Provider Notes (Signed)
 [facility]   [id number] 11/21/23 Arrival Time: 0846  ASSESSMENT & PLAN:  1. Vaginal discharge   2. Sore throat   3. Genital herpes simplex, unspecified site   4. Anxiety    Declines vaginal exam today. Will tx based on history. Meds ordered this encounter  Medications   valACYclovir  (VALTREX ) 1000 MG tablet    Sig: Take one tablet daily for 5 days for recurrent HSV outbreaks.    Dispense:  20 tablet    Refill:  0   hydrOXYzine  (ATARAX ) 25 MG tablet    Sig: Take 1 tablet (25 mg total) by mouth 3 (three) times daily as needed for anxiety.    Dispense:  30 tablet    Refill:  0   Atarax  refill at pt request. Vaginal cytology pending. ST likely viral in nature.  Labs Reviewed  POCT URINALYSIS DIP (MANUAL ENTRY) - Abnormal; Notable for the following components:      Result Value   Blood, UA large (*)    Protein Ur, POC =30 (*)    All other components within normal limits  POCT URINE PREGNANCY  POC SARS CORONAVIRUS 2 AG -  ED  CERVICOVAGINAL ANCILLARY ONLY    Reviewed expectations re: course of current medical issues. Questions answered. Outlined signs and symptoms indicating need for more acute intervention. Patient verbalized understanding. After Visit Summary given.   SUBJECTIVE:  Sharon Johnston is a 33 y.o. female who presents with complaint of genital herpes outbreak; sev days. Mild ST over last few days. Denies fever/resp symptoms. Requests refill on Atarax ; mild anxiety.  OBJECTIVE:  Vitals:   11/21/23 0909  BP: 131/85  Pulse: 77  Resp: 16  Temp: 98 F (36.7 C)  TempSrc: Oral  SpO2: 97%    General appearance: alert, cooperative, appears stated age and no distress Throat: mild cobblestoning Lungs: unlabored respirations; speaks full sentences without difficulty Back: no CVA tenderness; FROM at waist Abdomen: soft, non-tender GU: deferred/declined Skin: warm and dry Psychological: alert and cooperative; normal mood and affect.  Results  for orders placed or performed during the hospital encounter of 11/21/23  POCT urinalysis dipstick   Collection Time: 11/21/23  9:31 AM  Result Value Ref Range   Color, UA yellow yellow   Clarity, UA clear clear   Glucose, UA negative negative mg/dL   Bilirubin, UA negative negative   Ketones, POC UA negative negative mg/dL   Spec Grav, UA 8.974 8.989 - 1.025   Blood, UA large (A) negative   pH, UA 6.0 5.0 - 8.0   Protein Ur, POC =30 (A) negative mg/dL   Urobilinogen, UA 0.2 0.2 or 1.0 E.U./dL   Nitrite, UA Negative Negative   Leukocytes, UA Negative Negative  POCT urine pregnancy   Collection Time: 11/21/23  9:31 AM  Result Value Ref Range   Preg Test, Ur Negative Negative  POC SARS Coronavirus 2 Ag-ED - Nasal Swab   Collection Time: 11/21/23 10:12 AM  Result Value Ref Range   SARS Coronavirus 2 Ag Negative Negative    Labs Reviewed  POCT URINALYSIS DIP (MANUAL ENTRY) - Abnormal; Notable for the following components:      Result Value   Blood, UA large (*)    Protein Ur, POC =30 (*)    All other components within normal limits  POCT URINE PREGNANCY  POC SARS CORONAVIRUS 2 AG -  ED  CERVICOVAGINAL ANCILLARY ONLY    No Known Allergies  Past Medical History:  Diagnosis Date  Chlamydia    Depression    hospitalized for 2 weeks after suicide att was post partum   Herpes    Hx of gonorrhea    Hypertension    gestational HTN   Kidney infection    Family History  Problem Relation Age of Onset   Hypertension Mother    Hypertension Maternal Grandmother    Healthy Father    Hypertension Maternal Aunt    Depression Cousin    Social History   Socioeconomic History   Marital status: Single    Spouse name: Not on file   Number of children: 4   Years of education: diploma   Highest education level: Not on file  Occupational History   Not on file  Tobacco Use   Smoking status: Some Days    Types: Cigars   Smokeless tobacco: Never   Tobacco comments:    two  black and milds per day  Vaping Use   Vaping status: Never Used  Substance and Sexual Activity   Alcohol use: No   Drug use: No   Sexual activity: Yes    Birth control/protection: None  Other Topics Concern   Not on file  Social History Narrative   ** Merged History Encounter **       Social Drivers of Corporate investment banker Strain: Not on file  Food Insecurity: Not on file  Transportation Needs: Not on file  Physical Activity: Not on file  Stress: Not on file  Social Connections: Not on file  Intimate Partner Violence: Not on file           [place], MD 11/22/23 [phone]

## 2023-12-03 ENCOUNTER — Emergency Department (HOSPITAL_COMMUNITY)
Admission: EM | Admit: 2023-12-03 | Discharge: 2023-12-04 | Disposition: A | Discharge status: Home / Self Care | Payer: Self-pay | Attending: Emergency Medicine | Admitting: Emergency Medicine

## 2023-12-03 ENCOUNTER — Other Ambulatory Visit: Payer: Self-pay

## 2023-12-03 DIAGNOSIS — N3001 Acute cystitis with hematuria: Secondary | ICD-10-CM | POA: Insufficient documentation

## 2023-12-03 LAB — URINALYSIS, ROUTINE W REFLEX MICROSCOPIC
Bilirubin Urine: NEGATIVE
Glucose, UA: NEGATIVE mg/dL
Ketones, ur: NEGATIVE mg/dL
Nitrite: NEGATIVE
Protein, ur: 30 mg/dL — AB
Specific Gravity, Urine: 1.012 (ref 1.005–1.030)
WBC, UA: >50 WBC/hpf (ref 0–5)
pH: 5 (ref 5.0–8.0)

## 2023-12-03 LAB — PREGNANCY, URINE: Preg Test, Ur: NEGATIVE

## 2023-12-03 MED ORDER — IBUPROFEN 800 MG PO TABS
800.0000 mg | ORAL_TABLET | Freq: Once | ORAL | Status: AC
  Administered 2023-12-03: 800 mg via ORAL
  Filled 2023-12-03: qty 1

## 2023-12-03 NOTE — ED Triage Notes (Signed)
 Pt states that she has had lower back pain with urinary frequency and burning and pain with urination x 3 days.

## 2023-12-04 DIAGNOSIS — I10 Essential (primary) hypertension: Secondary | ICD-10-CM | POA: Insufficient documentation

## 2023-12-04 DIAGNOSIS — N12 Tubulo-interstitial nephritis, not specified as acute or chronic: Secondary | ICD-10-CM | POA: Insufficient documentation

## 2023-12-04 DIAGNOSIS — Z79899 Other long term (current) drug therapy: Secondary | ICD-10-CM | POA: Insufficient documentation

## 2023-12-04 MED ORDER — CEFTRIAXONE SODIUM 1 G IJ SOLR
1.0000 g | Freq: Once | INTRAMUSCULAR | Status: AC
  Administered 2023-12-04: 1 g via INTRAMUSCULAR
  Filled 2023-12-04: qty 10

## 2023-12-04 MED ORDER — LIDOCAINE HCL (PF) 1 % IJ SOLN
INTRAMUSCULAR | Status: AC
  Administered 2023-12-04: 5 mL
  Filled 2023-12-04: qty 5

## 2023-12-04 MED ORDER — KETOROLAC TROMETHAMINE 15 MG/ML IJ SOLN
15.0000 mg | Freq: Once | INTRAMUSCULAR | Status: AC
  Administered 2023-12-04: 15 mg via INTRAMUSCULAR
  Filled 2023-12-04: qty 1

## 2023-12-04 MED ORDER — PHENAZOPYRIDINE HCL 200 MG PO TABS
200.0000 mg | ORAL_TABLET | Freq: Three times a day (TID) | ORAL | 0 refills | Status: DC
Start: 2023-12-04 — End: 2024-01-10

## 2023-12-04 MED ORDER — CEPHALEXIN 500 MG PO CAPS: 500.0000 mg | ORAL_CAPSULE | Freq: Two times a day (BID) | ORAL | 0 refills | Status: DC

## 2023-12-04 NOTE — ED Provider Notes (Signed)
 [place] EMERGENCY DEPARTMENT AT [facility] Provider Note   CSN: [id number] Arrival date & time: 12/03/23  2259     Patient presents with: Urinary Frequency   Sharon Johnston is a 33 y.o. female.   33 year old female presents with complaint of urinary frequency and dysuria x 2 days.  Reports prior history of kidney infection.  States that Sharon Johnston has general body aches as well.  Denies fevers, vomiting, changes in bowel habits.  No other complaints or concerns. No history of kidney stones.        Prior to Admission medications   Medication Sig Start Date End Date Taking? Authorizing Provider  cephALEXin  (KEFLEX ) 500 MG capsule Take 1 capsule (500 mg total) by mouth 2 (two) times daily for 5 days. 12/04/23 12/09/23 Yes Beverley Leita LABOR, PA-C  phenazopyridine  (PYRIDIUM ) 200 MG tablet Take 1 tablet (200 mg total) by mouth 3 (three) times daily. 12/04/23  Yes Beverley Leita LABOR, PA-C  amLODipine  (NORVASC ) 5 MG tablet Take 1 tablet (5 mg total) by mouth daily. 08/10/23   Joshua Domino, DO  Blood Pressure Monitoring (BLOOD PRESSURE KIT) DEVI 1 kit by Does not apply route as needed. 08/30/22   Eveline Lynwood MATSU, MD  hydrOXYzine  (ATARAX ) 25 MG tablet Take 1 tablet (25 mg total) by mouth 3 (three) times daily as needed for anxiety. 11/21/23   Rolinda Rogue, MD  triamcinolone  cream (KENALOG ) 0.1 % Apply 1 Application topically 2 (two) times daily. Use no longer than 2 weeks in a row Patient not taking: Reported on 11/21/2023 09/01/23   Rising, Asberry, PA-C  valACYclovir  (VALTREX ) 1000 MG tablet Take one tablet daily for 5 days for recurrent HSV outbreaks. 11/21/23   Rolinda Rogue, MD  ferrous sulfate  325 (65 FE) MG tablet Take 1 tablet (325 mg total) by mouth 2 (two) times daily with a meal. 04/25/17 11/01/18  Phelps, Jazma Y, DO  medroxyPROGESTERone  (DEPO-PROVERA ) 150 MG/ML injection Inject 1 mL (150 mg total) into the muscle every 3 (three) months. 07/26/17 04/22/19  Eveline Lynwood MATSU, MD    Allergies:  Patient has no known allergies.    Review of Systems Negative except as per HPI Updated Vital Signs BP 138/83 (BP Location: Right Arm)   Pulse (!) 56   Temp 98 F (36.7 C)   Resp 18   LMP  (Approximate)   SpO2 100%   Physical Exam Vitals and nursing note reviewed.  Constitutional:      General: Sharon Johnston is not in acute distress.    Appearance: Sharon Johnston is well-developed. Sharon Johnston is not diaphoretic.  HENT:     Head: Normocephalic and atraumatic.  Pulmonary:     Effort: Pulmonary effort is normal.  Abdominal:     Palpations: Abdomen is soft.     Tenderness: There is no abdominal tenderness. There is no right CVA tenderness, left CVA tenderness, guarding or rebound.  Skin:    General: Skin is warm and dry.     Findings: No erythema or rash.  Neurological:     Mental Status: Sharon Johnston is alert and oriented to person, place, and time.  Psychiatric:        Behavior: Behavior normal.     (all labs ordered are listed, but only abnormal results are displayed) Labs Reviewed  URINALYSIS, ROUTINE W REFLEX MICROSCOPIC - Abnormal; Notable for the following components:      Result Value   APPearance HAZY (*)    Hgb urine dipstick MODERATE (*)    Protein, ur  30 (*)    Leukocytes,Ua LARGE (*)    Bacteria, UA MANY (*)    All other components within normal limits  PREGNANCY, URINE    EKG: None  Radiology: No results found.   Procedures   Medications Ordered in the ED  cefTRIAXone  (ROCEPHIN ) injection 1 g (has no administration in time range)  ketorolac  (TORADOL ) 15 MG/ML injection 15 mg (has no administration in time range)  ibuprofen  (ADVIL ) tablet 800 mg (800 mg Oral Given 12/03/23 2341)                                    Medical Decision Making Amount and/or Complexity of Data Reviewed Labs: ordered.  Risk Prescription drug management.   33 year old female with complaint of dysuria and frequency x 2 days with bodyaches.  No history of prior kidney stones.  Patient is alert,  nontoxic and in no distress.  Her abdomen is soft and nontender without guarding or rebound.  Sharon Johnston does not have any CVA tenderness.  Sharon Johnston is afebrile.  Urinalysis suggest urinary tract infection with moderate hemoglobin, protein, large leukocytes and many bacteria.  Plan is to provide IM Rocephin  as well as Toradol  for pain.  Will discharge on antibiotics and recommend PCP recheck.     Final diagnoses:  Acute cystitis with hematuria    ED Discharge Orders          Ordered    cephALEXin  (KEFLEX ) 500 MG capsule  2 times daily        12/04/23 0534    phenazopyridine  (PYRIDIUM ) 200 MG tablet  3 times daily        12/04/23 0534               Beverley Leita LABOR, PA-C 12/04/23 0534    Long, Fonda MATSU, MD 12/07/23 [phone]

## 2023-12-04 NOTE — Discharge Instructions (Signed)
 Take antibiotics as prescribed and complete the full course. Take Pyridium  as needed as prescribed for pain. Recheck with your primary care provider.

## 2023-12-05 ENCOUNTER — Emergency Department (HOSPITAL_COMMUNITY)
Admission: EM | Admit: 2023-12-05 | Discharge: 2023-12-05 | Disposition: A | Discharge status: Home / Self Care | Payer: Self-pay | Attending: Emergency Medicine | Admitting: Emergency Medicine

## 2023-12-05 ENCOUNTER — Encounter (HOSPITAL_COMMUNITY): Payer: Self-pay

## 2023-12-05 DIAGNOSIS — N12 Tubulo-interstitial nephritis, not specified as acute or chronic: Secondary | ICD-10-CM

## 2023-12-05 LAB — I-STAT CG4 LACTIC ACID, ED: Lactic Acid, Venous: 0.6 mmol/L (ref 0.5–1.9)

## 2023-12-05 LAB — URINALYSIS, W/ REFLEX TO CULTURE (INFECTION SUSPECTED)
Bilirubin Urine: NEGATIVE
Glucose, UA: NEGATIVE mg/dL
Ketones, ur: NEGATIVE mg/dL
Nitrite: NEGATIVE
Protein, ur: 30 mg/dL — AB
Specific Gravity, Urine: 1.023 (ref 1.005–1.030)
WBC, UA: >50 WBC/hpf (ref 0–5)
pH: 5 (ref 5.0–8.0)

## 2023-12-05 LAB — COMPREHENSIVE METABOLIC PANEL WITH GFR
ALT: 8 U/L (ref 0–44)
AST: 15 U/L (ref 15–41)
Albumin: 4.2 g/dL (ref 3.5–5.0)
Alkaline Phosphatase: 63 U/L (ref 38–126)
Anion gap: 11 (ref 5–15)
BUN: 13 mg/dL (ref 6–20)
CO2: 23 mmol/L (ref 22–32)
Calcium: 8.8 mg/dL — ABNORMAL LOW (ref 8.9–10.3)
Chloride: 102 mmol/L (ref 98–111)
Creatinine, Ser: 0.85 mg/dL (ref 0.44–1.00)
GFR, Estimated: >60 mL/min (ref >60)
Glucose, Bld: 100 mg/dL — ABNORMAL HIGH (ref 70–99)
Potassium: 3.5 mmol/L (ref 3.5–5.1)
Sodium: 136 mmol/L (ref 135–145)
Total Bilirubin: 0.8 mg/dL (ref 0.0–1.2)
Total Protein: 7.4 g/dL (ref 6.5–8.1)

## 2023-12-05 LAB — LIPASE, BLOOD: Lipase: 15 U/L (ref 11–51)

## 2023-12-05 LAB — CBC WITH DIFFERENTIAL/PLATELET
Abs Immature Granulocytes: 0.04 K/uL (ref 0.00–0.07)
Basophils Absolute: 0 K/uL (ref 0.0–0.1)
Basophils Relative: 0 %
Eosinophils Absolute: 0 K/uL (ref 0.0–0.5)
Eosinophils Relative: 0 %
HCT: 36.6 % (ref 36.0–46.0)
Hemoglobin: 11 g/dL — ABNORMAL LOW (ref 12.0–15.0)
Immature Granulocytes: 1 %
Lymphocytes Relative: 5 %
Lymphs Abs: 0.5 K/uL — ABNORMAL LOW (ref 0.7–4.0)
MCH: 26 pg (ref 26.0–34.0)
MCHC: 30.1 g/dL (ref 30.0–36.0)
MCV: 86.5 fL (ref 80.0–100.0)
Monocytes Absolute: 0.5 K/uL (ref 0.1–1.0)
Monocytes Relative: 6 %
Neutro Abs: 7.5 K/uL (ref 1.7–7.7)
Neutrophils Relative %: 88 %
Platelets: 298 K/uL (ref 150–400)
RBC: 4.23 MIL/uL (ref 3.87–5.11)
RDW: 14 % (ref 11.5–15.5)
WBC: 8.4 K/uL (ref 4.0–10.5)
nRBC: 0 % (ref 0.0–0.2)

## 2023-12-05 LAB — HCG, SERUM, QUALITATIVE: Preg, Serum: NEGATIVE

## 2023-12-05 LAB — PROTIME-INR
INR: 2 — ABNORMAL HIGH (ref 0.8–1.2)
Prothrombin Time: 24 s — ABNORMAL HIGH (ref 11.4–15.2)

## 2023-12-05 MED ORDER — CEFTRIAXONE SODIUM 1 G IJ SOLR
1.0000 g | Freq: Once | INTRAMUSCULAR | Status: AC
  Administered 2023-12-05: 1 g via INTRAVENOUS
  Filled 2023-12-05: qty 10

## 2023-12-05 MED ORDER — PROCHLORPERAZINE EDISYLATE 10 MG/2ML IJ SOLN
5.0000 mg | Freq: Once | INTRAMUSCULAR | Status: AC
Start: 2023-12-05 — End: 2023-12-05
  Administered 2023-12-05: 5 mg via INTRAVENOUS
  Filled 2023-12-05: qty 2

## 2023-12-05 MED ORDER — ALUM & MAG HYDROXIDE-SIMETH 200-200-20 MG/5ML PO SUSP
30.0000 mL | Freq: Once | ORAL | Status: AC
  Administered 2023-12-05: 30 mL via ORAL
  Filled 2023-12-05: qty 30

## 2023-12-05 MED ORDER — LIDOCAINE VISCOUS HCL 2 % MT SOLN
15.0000 mL | Freq: Once | OROMUCOSAL | Status: AC
  Administered 2023-12-05: 15 mL via ORAL
  Filled 2023-12-05: qty 15

## 2023-12-05 MED ORDER — CEPHALEXIN 500 MG PO CAPS: 500.0000 mg | ORAL_CAPSULE | Freq: Three times a day (TID) | ORAL | 0 refills | Status: AC

## 2023-12-05 MED ORDER — ONDANSETRON 4 MG PO TBDP: 4.0000 mg | ORAL_TABLET | Freq: Three times a day (TID) | ORAL | 0 refills | Status: AC | PRN
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
## 2023-12-05 NOTE — ED Notes (Signed)
 Called lab to add Lipase lab to previous labs collected

## 2023-12-05 NOTE — Discharge Instructions (Addendum)
 For pain control you may take 1000 mg of acetaminophen (Tylenol) every 8 hours and/or 600 mg of Ibuprofen (Motrin, Advil, etc.) every 6-8 hours as needed.  Please limit acetaminophen (Tylenol) to 4000 mg and Ibuprofen (Motrin, Advil, etc.) to 2400 mg for a 24hr period. Please note that other over-the-counter medicine may contain acetaminophen or ibuprofen as a component of their ingredients.

## 2023-12-05 NOTE — ED Triage Notes (Signed)
 Pt. Arrives for abdominal pain. Pt. Was diagnosed with a UTI and prescribed antibiotics. Pt. States that she did not pick them up because she's been asleep all day. States that she feels worse than she felt yesterday.

## 2023-12-06 LAB — URINE CULTURE: Culture: NO GROWTH

## 2023-12-10 LAB — CULTURE, BLOOD (ROUTINE X 2)
Culture: NO GROWTH
Culture: NO GROWTH

## 2024-01-08 ENCOUNTER — Emergency Department (HOSPITAL_COMMUNITY)
Admission: EM | Admit: 2024-01-08 | Discharge: 2024-01-08 | Discharge status: Against Medical Advice | Payer: Self-pay | Attending: Emergency Medicine | Admitting: Emergency Medicine

## 2024-01-08 DIAGNOSIS — K0889 Other specified disorders of teeth and supporting structures: Secondary | ICD-10-CM | POA: Insufficient documentation

## 2024-01-08 DIAGNOSIS — R509 Fever, unspecified: Secondary | ICD-10-CM | POA: Insufficient documentation

## 2024-01-08 DIAGNOSIS — Z5321 Procedure and treatment not carried out due to patient leaving prior to being seen by health care provider: Secondary | ICD-10-CM | POA: Insufficient documentation

## 2024-01-10 ENCOUNTER — Ambulatory Visit (HOSPITAL_COMMUNITY)
Admission: EM | Admit: 2024-01-10 | Discharge: 2024-01-10 | Disposition: A | Discharge status: Home / Self Care | Payer: Self-pay | Attending: Family Medicine | Admitting: Family Medicine

## 2024-01-10 ENCOUNTER — Other Ambulatory Visit: Payer: Self-pay

## 2024-01-10 ENCOUNTER — Encounter (HOSPITAL_COMMUNITY): Payer: Self-pay | Admitting: Emergency Medicine

## 2024-01-10 DIAGNOSIS — N898 Other specified noninflammatory disorders of vagina: Secondary | ICD-10-CM | POA: Insufficient documentation

## 2024-01-10 DIAGNOSIS — R300 Dysuria: Secondary | ICD-10-CM | POA: Insufficient documentation

## 2024-01-10 DIAGNOSIS — K0889 Other specified disorders of teeth and supporting structures: Secondary | ICD-10-CM | POA: Insufficient documentation

## 2024-01-10 DIAGNOSIS — Z711 Person with feared health complaint in whom no diagnosis is made: Secondary | ICD-10-CM | POA: Insufficient documentation

## 2024-01-10 LAB — POCT URINE PREGNANCY: Preg Test, Ur: NEGATIVE

## 2024-01-10 LAB — POCT URINALYSIS DIP (MANUAL ENTRY)
Glucose, UA: NEGATIVE mg/dL
Nitrite, UA: NEGATIVE
Protein Ur, POC: 100 mg/dL — AB
Spec Grav, UA: 1.015 (ref 1.010–1.025)
Urobilinogen, UA: 0.2 U/dL
pH, UA: 6 (ref 5.0–8.0)

## 2024-01-10 MED ORDER — CEFTRIAXONE SODIUM 1 G IJ SOLR
INTRAMUSCULAR | Status: AC
  Filled 2024-01-10: qty 10

## 2024-01-10 MED ORDER — CEFTRIAXONE SODIUM 1 G IJ SOLR
1.0000 g | Freq: Once | INTRAMUSCULAR | Status: AC
Start: 2024-01-10 — End: 2024-01-10
  Administered 2024-01-10: 1 g via INTRAMUSCULAR

## 2024-01-10 MED ORDER — HYDROCODONE-ACETAMINOPHEN 5-325 MG PO TABS: 1.0000 | ORAL_TABLET | Freq: Four times a day (QID) | ORAL | 0 refills | Status: DC | PRN

## 2024-01-10 MED ORDER — DOXYCYCLINE HYCLATE 100 MG PO CAPS: 100.0000 mg | ORAL_CAPSULE | Freq: Two times a day (BID) | ORAL | 0 refills | Status: DC

## 2024-01-10 MED ORDER — LIDOCAINE HCL (PF) 1 % IJ SOLN
INTRAMUSCULAR | Status: AC
  Filled 2024-01-10: qty 2

## 2024-01-10 NOTE — Discharge Instructions (Addendum)
 You have been given the following today for treatment of the possibility of UTI and STI:  cefTRIAXone  (ROCEPHIN ) injection 1000 mg  Please pick up your prescription for doxycycline  100 mg and begin taking twice daily for the next seven (7) days.  Even though we have treated you today, we have sent testing for sexually transmitted infections. We will notify you of any positive results once they are received. If required, we will prescribe any medications you might need.  Please refrain from all sexual activity for at least the next seven days.  Be aware, you have been prescribed pain medications that may cause drowsiness. While taking this medication, do not take any other medications containing acetaminophen  (Tylenol ). Do not combine with alcohol or recreational drugs. Please do not drive, operate heavy machinery, or take part in activities that require making important decisions while on this medication as your judgement may be clouded.
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
## 2024-01-10 NOTE — ED Triage Notes (Signed)
 Reports wisdom tooth is chipped on the right , this has been going on for a week.  Tylenol  and ibuprofen  are not helping.    Patient has lower abdominal pain, burning with urination , urgency and frequency. Patient reports fever.

## 2024-01-11 LAB — CERVICOVAGINAL ANCILLARY ONLY
Bacterial Vaginitis (gardnerella): POSITIVE — AB
Candida Glabrata: NEGATIVE
Candida Vaginitis: NEGATIVE
Chlamydia: NEGATIVE
Comment: NEGATIVE
Comment: NEGATIVE
Comment: NEGATIVE
Comment: NEGATIVE
Comment: NEGATIVE
Comment: NORMAL
Neisseria Gonorrhea: NEGATIVE
Trichomonas: NEGATIVE

## 2024-01-12 ENCOUNTER — Ambulatory Visit (HOSPITAL_COMMUNITY): Payer: Self-pay

## 2024-01-12 LAB — URINE CULTURE: Culture: >=100000 — AB

## 2024-01-12 MED ORDER — NITROFURANTOIN MONOHYD MACRO 100 MG PO CAPS: 100.0000 mg | ORAL_CAPSULE | Freq: Two times a day (BID) | ORAL | 0 refills | Status: AC

## 2024-01-12 MED ORDER — NITROFURANTOIN MONOHYD MACRO 100 MG PO CAPS: 100.0000 mg | ORAL_CAPSULE | Freq: Two times a day (BID) | ORAL | 0 refills | Status: DC

## 2024-01-12 MED ORDER — METRONIDAZOLE 0.75 % VA GEL: 1.0000 | Freq: Every day | VAGINAL | 0 refills | Status: AC

## 2024-01-12 NOTE — Addendum Note (Signed)
 Addended by: ARNALDO ALFONSO RAMAN on: 01/12/2024 03:02 PM   Modules accepted: Orders

## 2024-01-12 NOTE — Telephone Encounter (Signed)
 Prescription for Macrobid  sent to pharmacy for treatment of UTI.  Treatment for BV is not indicated at this time.

## 2024-01-12 NOTE — Addendum Note (Signed)
 Addended by: ARNALDO ALFONSO RAMAN on: 01/12/2024 03:01 PM   Modules accepted: Orders
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
## 2024-01-16 ENCOUNTER — Encounter (HOSPITAL_COMMUNITY): Payer: Self-pay | Admitting: Student in an Organized Health Care Education/Training Program

## 2024-01-16 ENCOUNTER — Ambulatory Visit (INDEPENDENT_AMBULATORY_CARE_PROVIDER_SITE_OTHER): Admitting: Student in an Organized Health Care Education/Training Program

## 2024-01-16 VITALS — BP 175/91 | HR 60 | Ht 63.0 in | Wt 120.0 lb

## 2024-01-16 DIAGNOSIS — F17200 Nicotine dependence, unspecified, uncomplicated: Secondary | ICD-10-CM

## 2024-01-16 DIAGNOSIS — F6381 Intermittent explosive disorder: Secondary | ICD-10-CM | POA: Diagnosis not present

## 2024-01-16 DIAGNOSIS — G479 Sleep disorder, unspecified: Secondary | ICD-10-CM | POA: Diagnosis not present

## 2024-01-16 DIAGNOSIS — F330 Major depressive disorder, recurrent, mild: Secondary | ICD-10-CM | POA: Diagnosis not present

## 2024-01-16 DIAGNOSIS — F4310 Post-traumatic stress disorder, unspecified: Secondary | ICD-10-CM

## 2024-01-16 MED ORDER — SERTRALINE HCL 50 MG PO TABS: 50.0000 mg | ORAL_TABLET | Freq: Every day | ORAL | 1 refills | Status: AC

## 2024-01-16 NOTE — Patient Instructions (Signed)
 [facility] Patient Name: Sharon Johnston Date of Visit: [01/16/24  Provider Name: Dr. Homer  Dear Tarisha LOISE Moats, it was a pleasure seeing you today, and we appreciate the opportunity to care for you.   These are the changes we have made to your medications:  Start sertraline (zoloft) 25 mg (half a tablet) for 7 days, then increase to 50 mg (1 tablets) once daily until follow up   Lifestyle Recommendations Sleep Hygiene: Aim for 7-9 hours of quality sleep each night. Establish a regular sleep routine and create a restful environment. No smoking at least 2 hours before bedtime. No lights.  Stress Management: Practice stress-reducing techniques such as mindfulness, meditation, deep breathing exercises, or hobbies that you enjoy. Smoking Cessation: If you smoke, consider quitting. We can provide resources and support to help you. Alcohol Consumption: Limit alcohol intake to moderate levels--up to one drink per day for women and up to two drinks per day for men. Routine Health Screenings: Stay up-to-date with routine health screenings and vaccinations as recommended.

## 2024-01-22 ENCOUNTER — Other Ambulatory Visit (INDEPENDENT_AMBULATORY_CARE_PROVIDER_SITE_OTHER): Payer: Self-pay

## 2024-01-22 DIAGNOSIS — F330 Major depressive disorder, recurrent, mild: Secondary | ICD-10-CM

## 2024-01-22 NOTE — Progress Notes (Signed)
 Patient  presents to the office for labs , labs were drawn from right [facility] with no complaints . Pt left clinic ambulatory and alert .

## 2024-01-22 NOTE — Addendum Note (Signed)
 Addended by: TRUDY MANUEL A on: 01/22/2024 09:07 AM   Modules accepted: Orders

## 2024-01-25 LAB — VITAMIN D 25 HYDROXY (VIT D DEFICIENCY, FRACTURES): Vit D, 25-Hydroxy: 18.7 ng/mL — ABNORMAL LOW (ref 30.0–100.0)

## 2024-01-25 LAB — TSH: TSH: 1.3 u[IU]/mL (ref 0.450–4.500)

## 2024-01-26 ENCOUNTER — Ambulatory Visit (HOSPITAL_COMMUNITY): Payer: Self-pay | Admitting: Student in an Organized Health Care Education/Training Program

## 2024-02-08 NOTE — Progress Notes (Deleted)
 [facility] MD Outpatient Progress Note  02/08/2024 5:29 PM Sharon Johnston  MRN:  [id number]  Assessment:  Sharon Johnston presents for follow-up evaluation on 02/08/24 .   The patient has the working diagnoses of ***  Chart Review Recent encounters since last visit: *** Recent Labs/Imaging since last visit: ***  Identifying Information: Sharon Johnston is a 33 y.o. female with a history of *** who is an established patient with [facility] for management of ***. Initial evaluation by this provider completed on ***. For a comprehensive history and detailed assessment, please refer to the initial adult assessment.  The patient's PMHx is significant for ***.   Plan:  # *** Past medication trials:  Status of problem: *** Interventions: -- ***  # *** Past medication trials:  Status of problem: *** Interventions: -- ***  # *** Past medication trials:  Status of problem: *** Interventions: -- ***  Patient was given contact information for behavioral health clinic and was instructed to call 911 for emergencies.   Subjective:  Chief Complaint: No chief complaint on file.   Interval History:  ***  During the patient's previous visit, *** was discussed, which they report ***.  AEs to medications: Medication compliance (missing doses, taking as directed):  Sleep: Appetite: Caffeine : Recent substance use: SI: Impact on functioning: ***   Visit Diagnosis: No diagnosis found.  *** PASTE HX FROM INITIAL ENCOUNTER HERE ***  Social History   Socioeconomic History   Marital status: Single    Spouse name: Not on file   Number of children: 4   Years of education: diploma   Highest education level: Not on file  Occupational History   Not on file  Tobacco Use   Smoking status: Some Days    Types: Cigars   Smokeless tobacco: Never   Tobacco comments:    two black and milds per day  Vaping Use   Vaping status: Never Used  Substance and Sexual  Activity   Alcohol use: No   Drug use: No   Sexual activity: Yes    Birth control/protection: None  Other Topics Concern   Not on file  Social History Narrative   ** Merged History Encounter **       Social Drivers of Corporate Investment Banker Strain: Not on file  Food Insecurity: Not on file  Transportation Needs: Not on file  Physical Activity: Not on file  Stress: Not on file  Social Connections: Not on file    Allergies: No Known Allergies  Current Medications: Current Outpatient Medications  Medication Sig Dispense Refill   amLODipine  (NORVASC ) 5 MG tablet Take 1 tablet (5 mg total) by mouth daily. 30 tablet 5   Blood Pressure Monitoring (BLOOD PRESSURE KIT) DEVI 1 kit by Does not apply route as needed. 1 each 0   doxycycline  (VIBRAMYCIN ) 100 MG capsule Take 1 capsule (100 mg total) by mouth 2 (two) times daily. 14 capsule 0   HYDROcodone -acetaminophen  (NORCO/VICODIN) 5-325 MG tablet Take 1 tablet by mouth every 6 (six) hours as needed for moderate pain (pain score 4-6) or severe pain (pain score 7-10). 8 tablet 0   hydrOXYzine  (ATARAX ) 25 MG tablet Take 1 tablet (25 mg total) by mouth 3 (three) times daily as needed for anxiety. 30 tablet 0   sertraline (ZOLOFT) 50 MG tablet Take 1 tablet (50 mg total) by mouth daily. Take half a tablet (25 mg ) once daily for 1 week then take 50 mg  daily 30 tablet 1   valACYclovir  (VALTREX ) 1000 MG tablet Take one tablet daily for 5 days for recurrent HSV outbreaks. 20 tablet 0   No current facility-administered medications for this visit.    ROS: Review of Systems  Objective:  Objective: Psychiatric Specialty Exam: General Appearance: Casual, fairly groomed  Eye Contact:  Good    Speech:  Clear, coherent, normal rate, spontaneous  Volume:  Normal   Mood:  see above  Affect:  Appropriate, congruent, full range  Thought Content: Logical, rumination  ***  Suicidal Thoughts: see subjective  Thought Process:  Coherent,  goal-directed, circumstantial ***  Orientation:  A&Ox4   Memory:  Immediate good  Judgment:  Fair   Insight:  Fair***  Concentration:  Attention and concentration good   Recall:  Good  Fund of Knowledge: Good  Language: Good, fluent  Psychomotor Activity: Normal  Akathisia:  NA   AIMS (if indicated): NA   Assets:   {Assets (PAA):22698}  ADL's:  Intact  Cognition: WNL  Sleep: see above  Appetite: see above    Physical Exam   Metabolic Disorder Labs: Lab Results  Component Value Date   HGBA1C 5.0 12/06/2016   No results found for: PROLACTIN No results found for: CHOL, TRIG, HDL, CHOLHDL, VLDL, LDLCALC Lab Results  Component Value Date   TSH 1.300 01/22/2024   TSH 0.899 ***Test methodology is 3rd generation TSH*** 09/07/2008    Therapeutic Level Labs: No results found for: LITHIUM No results found for: VALPROATE No results found for: CBMZ  Screenings:  GAD-7    Flowsheet Row Office Visit from 01/16/2024 in [facility] Counselor from 09/04/2023 in [facility] Counselor from 03/10/2023 in [facility]  Total GAD-7 Score 12 2 15    PHQ2-9    Flowsheet Row Office Visit from 01/16/2024 in [facility] Counselor from 09/04/2023 in [facility] Counselor from 03/10/2023 in [facility] Clinical Support from 12/24/2020 in [facility] Initial Prenatal from 10/07/2014 in [facility]  PHQ-2 Total Score 3 1 2  0 0  PHQ-9 Total Score 8 1 10 7  --   Flowsheet Row UC from 01/10/2024 in [facility] ED from 12/05/2023 in [facility] Department at [facility] ED from 12/03/2023 in [facility] Emergency Department at [facility]  C-SSRS RISK CATEGORY No Risk No Risk No Risk     Collaboration of Care:   Patient/Guardian was advised Release of Information must be obtained prior to any record release in order to collaborate their care with an outside provider. Patient/Guardian was advised if they have not already done so to contact the registration department to sign all necessary forms in order for us  to release information regarding their care.   Consent: Patient/Guardian gives verbal consent for treatment and assignment of benefits for services provided during this visit. Patient/Guardian expressed understanding and agreed to proceed.    Marlo Masson, MD 02/08/2024, 5:29 PM

## 2024-02-09 ENCOUNTER — Encounter (HOSPITAL_COMMUNITY): Admitting: Student in an Organized Health Care Education/Training Program

## 2024-02-14 ENCOUNTER — Other Ambulatory Visit: Payer: Self-pay

## 2024-02-14 ENCOUNTER — Inpatient Hospital Stay (HOSPITAL_COMMUNITY)
Admission: EM | Admit: 2024-02-14 | Discharge: 2024-02-16 | DRG: 872 | Disposition: A | Discharge status: Home / Self Care | Attending: Internal Medicine | Admitting: Internal Medicine

## 2024-02-14 ENCOUNTER — Emergency Department (HOSPITAL_COMMUNITY)

## 2024-02-14 DIAGNOSIS — F419 Anxiety disorder, unspecified: Secondary | ICD-10-CM | POA: Diagnosis present

## 2024-02-14 DIAGNOSIS — Z79899 Other long term (current) drug therapy: Secondary | ICD-10-CM

## 2024-02-14 DIAGNOSIS — Z87440 Personal history of urinary (tract) infections: Secondary | ICD-10-CM

## 2024-02-14 DIAGNOSIS — Z8249 Family history of ischemic heart disease and other diseases of the circulatory system: Secondary | ICD-10-CM

## 2024-02-14 DIAGNOSIS — N12 Tubulo-interstitial nephritis, not specified as acute or chronic: Secondary | ICD-10-CM | POA: Diagnosis present

## 2024-02-14 DIAGNOSIS — I10 Essential (primary) hypertension: Secondary | ICD-10-CM | POA: Diagnosis present

## 2024-02-14 DIAGNOSIS — F17290 Nicotine dependence, other tobacco product, uncomplicated: Secondary | ICD-10-CM | POA: Diagnosis present

## 2024-02-14 DIAGNOSIS — B009 Herpesviral infection, unspecified: Secondary | ICD-10-CM | POA: Diagnosis present

## 2024-02-14 DIAGNOSIS — Z793 Long term (current) use of hormonal contraceptives: Secondary | ICD-10-CM

## 2024-02-14 DIAGNOSIS — Z818 Family history of other mental and behavioral disorders: Secondary | ICD-10-CM

## 2024-02-14 DIAGNOSIS — Z9151 Personal history of suicidal behavior: Secondary | ICD-10-CM

## 2024-02-14 DIAGNOSIS — Z1152 Encounter for screening for COVID-19: Secondary | ICD-10-CM

## 2024-02-14 DIAGNOSIS — A419 Sepsis, unspecified organism: Principal | ICD-10-CM | POA: Diagnosis present

## 2024-02-14 DIAGNOSIS — F32A Depression, unspecified: Secondary | ICD-10-CM | POA: Diagnosis present

## 2024-02-14 LAB — CBC WITH DIFFERENTIAL/PLATELET
Abs Immature Granulocytes: 0.03 K/uL (ref 0.00–0.07)
Basophils Absolute: 0 K/uL (ref 0.0–0.1)
Basophils Relative: 0 %
Eosinophils Absolute: 0 K/uL (ref 0.0–0.5)
Eosinophils Relative: 0 %
HCT: 34.2 % — ABNORMAL LOW (ref 36.0–46.0)
Hemoglobin: 11.1 g/dL — ABNORMAL LOW (ref 12.0–15.0)
Immature Granulocytes: 0 %
Lymphocytes Relative: 8 %
Lymphs Abs: 0.8 K/uL (ref 0.7–4.0)
MCH: 26.4 pg (ref 26.0–34.0)
MCHC: 32.5 g/dL (ref 30.0–36.0)
MCV: 81.2 fL (ref 80.0–100.0)
Monocytes Absolute: 0.7 K/uL (ref 0.1–1.0)
Monocytes Relative: 7 %
Neutro Abs: 8.9 K/uL — ABNORMAL HIGH (ref 1.7–7.7)
Neutrophils Relative %: 85 %
Platelets: 368 K/uL (ref 150–400)
RBC: 4.21 MIL/uL (ref 3.87–5.11)
RDW: 14.3 % (ref 11.5–15.5)
WBC: 10.5 K/uL (ref 4.0–10.5)
nRBC: 0 % (ref 0.0–0.2)

## 2024-02-14 LAB — RESP PANEL BY RT-PCR (RSV, FLU A&B, COVID)  RVPGX2
Influenza A by PCR: NEGATIVE
Influenza B by PCR: NEGATIVE
Resp Syncytial Virus by PCR: NEGATIVE
SARS Coronavirus 2 by RT PCR: NEGATIVE

## 2024-02-14 LAB — URINALYSIS, W/ REFLEX TO CULTURE (INFECTION SUSPECTED)
Bilirubin Urine: NEGATIVE
Glucose, UA: NEGATIVE mg/dL
Ketones, ur: 5 mg/dL — AB
Leukocytes,Ua: NEGATIVE
Nitrite: POSITIVE — AB
Protein, ur: 30 mg/dL — AB
Specific Gravity, Urine: >1.046 — ABNORMAL HIGH (ref 1.005–1.030)
pH: 5 (ref 5.0–8.0)

## 2024-02-14 LAB — COMPREHENSIVE METABOLIC PANEL WITH GFR
ALT: 9 U/L (ref 0–44)
AST: 21 U/L (ref 15–41)
Albumin: 4.6 g/dL (ref 3.5–5.0)
Alkaline Phosphatase: 67 U/L (ref 38–126)
Anion gap: 13 (ref 5–15)
BUN: 13 mg/dL (ref 6–20)
CO2: 20 mmol/L — ABNORMAL LOW (ref 22–32)
Calcium: 9.3 mg/dL (ref 8.9–10.3)
Chloride: 102 mmol/L (ref 98–111)
Creatinine, Ser: 1.03 mg/dL — ABNORMAL HIGH (ref 0.44–1.00)
GFR, Estimated: >60 mL/min (ref >60)
Glucose, Bld: 97 mg/dL (ref 70–99)
Potassium: 3.8 mmol/L (ref 3.5–5.1)
Sodium: 134 mmol/L — ABNORMAL LOW (ref 135–145)
Total Bilirubin: 1.1 mg/dL (ref 0.0–1.2)
Total Protein: 8.3 g/dL — ABNORMAL HIGH (ref 6.5–8.1)

## 2024-02-14 LAB — HCG, SERUM, QUALITATIVE: Preg, Serum: NEGATIVE

## 2024-02-14 LAB — PROTIME-INR
INR: 1.7 — ABNORMAL HIGH (ref 0.8–1.2)
Prothrombin Time: 20.7 s — ABNORMAL HIGH (ref 11.4–15.2)

## 2024-02-14 LAB — I-STAT CG4 LACTIC ACID, ED: Lactic Acid, Venous: 0.4 mmol/L — ABNORMAL LOW (ref 0.5–1.9)

## 2024-02-14 MED ORDER — SODIUM CHLORIDE 0.9 % IV SOLN
2.0000 g | INTRAVENOUS | Status: DC
  Administered 2024-02-15 – 2024-02-16 (×2): 2 g via INTRAVENOUS
  Filled 2024-02-14 (×2): qty 20

## 2024-02-14 MED ORDER — ACETAMINOPHEN 650 MG RE SUPP: 650.0000 mg | Freq: Four times a day (QID) | RECTAL | Status: DC | PRN

## 2024-02-14 MED ORDER — ACETAMINOPHEN 500 MG PO TABS
500.0000 mg | ORAL_TABLET | Freq: Once | ORAL | Status: AC
  Administered 2024-02-14: 500 mg via ORAL
  Filled 2024-02-14: qty 1

## 2024-02-14 MED ORDER — ONDANSETRON HCL 4 MG/2ML IJ SOLN
4.0000 mg | Freq: Once | INTRAMUSCULAR | Status: AC
  Administered 2024-02-14: 4 mg via INTRAVENOUS
  Filled 2024-02-14: qty 2

## 2024-02-14 MED ORDER — LACTATED RINGERS IV BOLUS (SEPSIS): 250.0000 mL | Freq: Once | INTRAVENOUS | Status: DC

## 2024-02-14 MED ORDER — LACTATED RINGERS IV SOLN: INTRAVENOUS | Status: AC

## 2024-02-14 MED ORDER — SODIUM CHLORIDE 0.9 % IV SOLN
2.0000 g | Freq: Once | INTRAVENOUS | Status: AC
  Administered 2024-02-14: 2 g via INTRAVENOUS
  Filled 2024-02-14: qty 20

## 2024-02-14 MED ORDER — SERTRALINE HCL 50 MG PO TABS
50.0000 mg | ORAL_TABLET | Freq: Every day | ORAL | Status: DC
  Administered 2024-02-15 – 2024-02-16 (×2): 50 mg via ORAL
  Filled 2024-02-14 (×2): qty 1

## 2024-02-14 MED ORDER — MORPHINE SULFATE (PF) 4 MG/ML IV SOLN
4.0000 mg | Freq: Once | INTRAVENOUS | Status: AC
  Administered 2024-02-14: 4 mg via INTRAVENOUS
  Filled 2024-02-14: qty 1

## 2024-02-14 MED ORDER — ONDANSETRON HCL 4 MG/2ML IJ SOLN
4.0000 mg | Freq: Four times a day (QID) | INTRAMUSCULAR | Status: DC | PRN
  Administered 2024-02-15: 4 mg via INTRAVENOUS
  Filled 2024-02-14: qty 2

## 2024-02-14 MED ORDER — ACETAMINOPHEN 325 MG PO TABS
650.0000 mg | ORAL_TABLET | Freq: Four times a day (QID) | ORAL | Status: DC | PRN
  Administered 2024-02-15 – 2024-02-16 (×4): 650 mg via ORAL
  Filled 2024-02-14 (×4): qty 2

## 2024-02-14 MED ORDER — OXYCODONE HCL 5 MG PO TABS
5.0000 mg | ORAL_TABLET | ORAL | Status: DC | PRN
  Administered 2024-02-14 – 2024-02-16 (×6): 5 mg via ORAL
  Filled 2024-02-14 (×8): qty 1

## 2024-02-14 MED ORDER — HYDROMORPHONE HCL 1 MG/ML IJ SOLN
0.5000 mg | Freq: Once | INTRAMUSCULAR | Status: AC
Start: 2024-02-14 — End: 2024-02-14
  Administered 2024-02-14: 0.5 mg via INTRAVENOUS
  Filled 2024-02-14: qty 1

## 2024-02-14 MED ORDER — IOHEXOL 300 MG/ML  SOLN
100.0000 mL | Freq: Once | INTRAMUSCULAR | Status: AC | PRN
  Administered 2024-02-14: 100 mL via INTRAVENOUS

## 2024-02-14 MED ORDER — ENOXAPARIN SODIUM 40 MG/0.4ML IJ SOSY
40.0000 mg | PREFILLED_SYRINGE | INTRAMUSCULAR | Status: DC
  Administered 2024-02-15 – 2024-02-16 (×2): 40 mg via SUBCUTANEOUS
  Filled 2024-02-14 (×2): qty 0.4

## 2024-02-14 MED ORDER — ONDANSETRON HCL 4 MG PO TABS
4.0000 mg | ORAL_TABLET | Freq: Four times a day (QID) | ORAL | Status: DC | PRN
Start: 2024-02-14 — End: 2024-02-16

## 2024-02-14 MED ORDER — SODIUM CHLORIDE 0.9 % IV SOLN: INTRAVENOUS | Status: AC

## 2024-02-14 MED ORDER — LACTATED RINGERS IV BOLUS (SEPSIS)
1000.0000 mL | Freq: Once | INTRAVENOUS | Status: AC
  Administered 2024-02-14: 1000 mL via INTRAVENOUS

## 2024-02-14 MED ORDER — IBUPROFEN 200 MG PO TABS
600.0000 mg | ORAL_TABLET | Freq: Once | ORAL | Status: AC
  Administered 2024-02-14: 600 mg via ORAL
  Filled 2024-02-14: qty 3

## 2024-02-14 MED ORDER — HYDROXYZINE HCL 25 MG PO TABS: 25.0000 mg | ORAL_TABLET | Freq: Three times a day (TID) | ORAL | Status: DC | PRN

## 2024-02-14 MED ORDER — LACTATED RINGERS IV BOLUS (SEPSIS): 500.0000 mL | Freq: Once | INTRAVENOUS | Status: DC

## 2024-02-14 NOTE — H&P (Signed)
 History and Physical    Sharon Johnston FMW:[id number] DOB: 25-Jan-1991 DOA: 02/14/2024  I have briefly reviewed the patient's prior medical records in [facility]  PCP: [place]  Patient coming from: home  Chief Complaint: Lower abdominal pain, flank pain, nausea, fever chills  HPI: Sharon Johnston is a 33 y.o. female with medical history significant of hypertension, depression who comes into the hospital with complaints of fever, chills, nausea, lower abdominal as well as flank pain.  Symptoms have been going on for the past 24 hours, getting worse and decided to come to the hospital.  She has been complaining of burning with urination since yesterday as well.  She denies any chest pain, denies any shortness of breath.  She has no lightheadedness or dizziness.  She denies any upper abdominal pain, and is able to eat food, keep it down without vomiting.  ED Course: In the emergency room she is afebrile to 103.2, heart rate initially was 127, she was initially hypertensive but blood pressure normalized.  She is satting well on room air.  Blood work is fairly unremarkable with a normal white count, normal lactic acid.  Urinalysis with evidence of bacteria/pyuria, and a CT scan was concerning for left-sided pyelonephritis and early emphysematous cystitis.  She was given Dilaudid  for pain control and received 2 g of ceftriaxone .  We are asked to admit  Review of Systems: All systems reviewed, and apart from HPI, all negative  Past Medical History:  Diagnosis Date   Chlamydia    Depression    hospitalized for 2 weeks after suicide att was post partum   Herpes    Hx of gonorrhea    Hypertension    gestational HTN   Kidney infection     Past Surgical History:  Procedure Laterality Date   NO PAST SURGERIES     TUBAL LIGATION N/A 06/24/2021   Procedure: POST PARTUM TUBAL LIGATION;  Surgeon: Lorence Ozell CROME, MD;  Location: [facility] LD ORS;  Service: Gynecology;   Laterality: N/A;     reports that she has been smoking cigars. She has never used smokeless tobacco. She reports that she does not drink alcohol and does not use drugs.  No Known Allergies  Family History  Problem Relation Age of Onset   Hypertension Mother    Hypertension Maternal Grandmother    Healthy Father    Hypertension Maternal Aunt    Depression Cousin     Prior to Admission medications   Medication Sig Start Date End Date Taking? Authorizing Provider  amLODipine  (NORVASC ) 5 MG tablet Take 1 tablet (5 mg total) by mouth daily. 08/10/23   Joshua Domino, DO  Blood Pressure Monitoring (BLOOD PRESSURE KIT) DEVI 1 kit by Does not apply route as needed. 08/30/22   Eveline Lynwood MATSU, MD  doxycycline  (VIBRAMYCIN ) 100 MG capsule Take 1 capsule (100 mg total) by mouth 2 (two) times daily. 01/10/24   Rolinda Rogue, MD  HYDROcodone -acetaminophen  (NORCO/VICODIN) 5-325 MG tablet Take 1 tablet by mouth every 6 (six) hours as needed for moderate pain (pain score 4-6) or severe pain (pain score 7-10). 01/10/24   Rolinda Rogue, MD  hydrOXYzine  (ATARAX ) 25 MG tablet Take 1 tablet (25 mg total) by mouth 3 (three) times daily as needed for anxiety. 11/21/23   Rolinda Rogue, MD  sertraline  (ZOLOFT ) 50 MG tablet Take 1 tablet (50 mg total) by mouth daily. Take half a tablet (25 mg ) once daily for 1 week then  take 50 mg daily 01/16/24 03/16/24  Carrion-Carrero, Marlo, MD  valACYclovir  (VALTREX ) 1000 MG tablet Take one tablet daily for 5 days for recurrent HSV outbreaks. 11/21/23   Rolinda Rogue, MD  ferrous sulfate  325 (65 FE) MG tablet Take 1 tablet (325 mg total) by mouth 2 (two) times daily with a meal. 04/25/17 11/01/18  Phelps, Jazma Y, DO  medroxyPROGESTERone  (DEPO-PROVERA ) 150 MG/ML injection Inject 1 mL (150 mg total) into the muscle every 3 (three) months. 07/26/17 04/22/19  Eveline Lynwood MATSU, MD    Physical Exam: Vitals:   02/14/24 1645 02/14/24 1700 02/14/24 1717 02/14/24 1933  BP: (!) 148/85   118/60   Pulse:    (!) 56  Resp: (!) 21 16  17   Temp:  100.1 F (37.8 C)  98 F (36.7 C)  TempSrc:  Oral  Oral  SpO2:    99%  Weight:   65.8 kg   Height:   5' 2.5 (1.588 m)     Constitutional: NAD, calm, comfortable Eyes: PERRL, lids and conjunctivae normal ENMT: Mucous membranes are moist.  Neck: normal, supple Respiratory: clear to auscultation bilaterally, no wheezing, no crackles.  Cardiovascular: Regular rate and rhythm, no murmurs / rubs / gallops. No extremity edema. Abdomen: Mild suprapubic tenderness.  CVA tenderness on the left Musculoskeletal: no clubbing / cyanosis. Normal muscle tone.  Skin: no rashes, lesions, ulcers. No induration Neurologic: Nonfocal  Labs on Admission: I have personally reviewed following labs and imaging studies  CBC: Recent Labs  Lab 02/14/24 1428  WBC 10.5  NEUTROABS 8.9*  HGB 11.1*  HCT 34.2*  MCV 81.2  PLT 368   Basic Metabolic Panel: Recent Labs  Lab 02/14/24 1428  NA 134*  K 3.8  CL 102  CO2 20*  GLUCOSE 97  BUN 13  CREATININE 1.03*  CALCIUM  9.3   Liver Function Tests: Recent Labs  Lab 02/14/24 1428  AST 21  ALT 9  ALKPHOS 67  BILITOT 1.1  PROT 8.3*  ALBUMIN 4.6   Coagulation Profile: Recent Labs  Lab 02/14/24 1558  INR 1.7*   BNP (last 3 results) No results for input(s): PROBNP in the last 8760 hours. CBG: No results for input(s): GLUCAP in the last 168 hours. Thyroid Function Tests: No results for input(s): TSH, T4TOTAL, FREET4, T3FREE, THYROIDAB in the last 72 hours. Urine analysis:    Component Value Date/Time   COLORURINE YELLOW 02/14/2024 1931   APPEARANCEUR CLEAR 02/14/2024 1931   LABSPEC >1.046 (H) 02/14/2024 1931   PHURINE 5.0 02/14/2024 1931   GLUCOSEU NEGATIVE 02/14/2024 1931   HGBUR MODERATE (A) 02/14/2024 1931   BILIRUBINUR NEGATIVE 02/14/2024 1931   BILIRUBINUR small (A) 01/10/2024 1127   BILIRUBINUR Neg 06/26/2017 1407   KETONESUR 5 (A) 02/14/2024 1931   PROTEINUR 30 (A)  02/14/2024 1931   UROBILINOGEN 0.2 01/10/2024 1127   UROBILINOGEN 1.0 11/05/2021 0844   NITRITE POSITIVE (A) 02/14/2024 1931   LEUKOCYTESUR NEGATIVE 02/14/2024 1931     Radiological Exams on Admission: CT ABDOMEN PELVIS W CONTRAST Result Date: 02/14/2024 CLINICAL DATA:  UTI, recurrent/complicated (Female) EXAM: CT ABDOMEN AND PELVIS WITH CONTRAST TECHNIQUE: Multidetector CT imaging of the abdomen and pelvis was performed using the standard protocol following bolus administration of intravenous contrast. RADIATION DOSE REDUCTION: This exam was performed according to the departmental dose-optimization program which includes automated exposure control, adjustment of the mA and/or kV according to patient size and/or use of iterative reconstruction technique. CONTRAST:  OMNIPAQUE  IOHEXOL  300 MG/ML  SOLN COMPARISON:  March 16, 2023 FINDINGS: Lower chest: No focal airspace consolidation or pleural effusion. Hepatobiliary: No mass.Focal fatty infiltration along the falciform ligament.No radiopaque stones or wall thickening of the gallbladder. No intrahepatic or extrahepatic biliary ductal dilation. The portal veins are patent. Pancreas: No mass or main ductal dilation. No peripancreatic inflammation or fluid collection. Spleen: Normal size. No mass. Adrenals/Urinary Tract: No adrenal masses. No renal mass. Left perinephric stranding with regions of wedge-shaped cortical hypoattenuation. No hydronephrosis or nephrolithiasis. Small locule of gas within the nondependent urinary bladder. Stomach/Bowel: The stomach is decompressed without focal abnormality. No small bowel wall thickening or inflammation. No small bowel obstruction.Normal appendix. Vascular/Lymphatic: No aortic aneurysm. No intraabdominal or pelvic lymphadenopathy. Reproductive: The uterus and ovaries are within normal limits for patient's age. Corpus luteal cyst in the left ovary measuring 2 cm. Small volume free fluid in the pelvis, likely  physiologic in a female of this age. Other: No pneumoperitoneum, ascites, or mesenteric inflammation. Musculoskeletal: No acute fracture or destructive lesion. IMPRESSION: 1. Findings concerning for left-sided pyelonephritis. Correlation with urinalysis recommended. 2. A small locule of gas is noted in the urinary bladder, which may be due to recent instrumentation or catheterization. Alternatively, early emphysematous cystitis could also have this appearance. Electronically Signed   By: Rogelia Myers M.D.   On: 02/14/2024 18:25   DG Chest Port 1 View Result Date: 02/14/2024 CLINICAL DATA:  Urinary tract infection, dysuria, sepsis EXAM: PORTABLE CHEST 1 VIEW COMPARISON:  08/10/2023 FINDINGS: Single frontal view of the chest demonstrates an unremarkable cardiac silhouette. No acute airspace disease, effusion, or pneumothorax. No acute bony abnormalities. IMPRESSION: 1. No acute intrathoracic process. Electronically Signed   By: Ozell Daring M.D.   On: 02/14/2024 17:07    EKG: Independently reviewed.  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  Has been placed on ceftriaxone , continue.  She has no history of MDR, most recent urinary cultures grew E. coli resistant to ampicillin but otherwise sensitive - Continue to monitor clinically, monitor fever curve, white count  Active problems Depression/anxiety - continue home medications  Essential hypertension - she came in hypertensive, but currently normotensive.  Given septic picture on admission with high fever and elevated heart rate hold amlodipine   DVT prophylaxis: Lovenox   Code Status: Full code  Family Communication: husband at bedside  Bed Type: medsurg Consults called: none  Obs/Inp: obs  Nilda Fendt, MD, PhD [facility]  Contact via [email or web address]  02/14/2024, 8:48 PM

## 2024-02-14 NOTE — ED Triage Notes (Signed)
 Pt ambulatory to triage stating that she thinks she has a UTI. PT reports bladder pain, dysuria, and pain in the lower back. Pt states that she has been taking acetaminophen  and ibuprofen  with little to no relief.   Last acetaminophen  was approx 13:30.

## 2024-02-14 NOTE — ED Provider Notes (Signed)
 [place] EMERGENCY DEPARTMENT AT [facility] Provider Note   CSN: [id number] Arrival date & time: 02/14/24  1401     Patient presents with: Dysuria, Back Pain, and Fever   Sharon Johnston is a 33 y.o. female patient with past medical history of pyelonephritis, hypertension presents to emergency room with complaint of 1 day fever, suprapubic pain, bilateral flank pain left greater than right, nausea, dysuria.  Patient is tachycardic and febrile at 103.2 she tells me she has taken 500 mg of Tylenol  at approximately 2 PM and 200 mg of ibuprofen  at approximately noon with  no change in fever nor pain. LMP 01/08/24    Dysuria Associated symptoms: fever   Back Pain Associated symptoms: dysuria and fever   Fever Associated symptoms: dysuria        Prior to Admission medications   Medication Sig Start Date End Date Taking? Authorizing Provider  amLODipine  (NORVASC ) 5 MG tablet Take 1 tablet (5 mg total) by mouth daily. 08/10/23   Joshua Domino, DO  Blood Pressure Monitoring (BLOOD PRESSURE KIT) DEVI 1 kit by Does not apply route as needed. 08/30/22   Eveline Lynwood MATSU, MD  doxycycline  (VIBRAMYCIN ) 100 MG capsule Take 1 capsule (100 mg total) by mouth 2 (two) times daily. 01/10/24   Rolinda Rogue, MD  HYDROcodone -acetaminophen  (NORCO/VICODIN) 5-325 MG tablet Take 1 tablet by mouth every 6 (six) hours as needed for moderate pain (pain score 4-6) or severe pain (pain score 7-10). 01/10/24   Rolinda Rogue, MD  hydrOXYzine  (ATARAX ) 25 MG tablet Take 1 tablet (25 mg total) by mouth 3 (three) times daily as needed for anxiety. 11/21/23   Rolinda Rogue, MD  sertraline  (ZOLOFT ) 50 MG tablet Take 1 tablet (50 mg total) by mouth daily. 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 11/21/23   Rolinda Rogue, MD  ferrous  sulfate 325 (65 FE) MG tablet Take 1 tablet (325 mg total) by mouth 2 (two) times daily with a meal. 04/25/17 11/01/18  Phelps, Jazma Y, DO  medroxyPROGESTERone  (DEPO-PROVERA ) 150 MG/ML injection Inject 1 mL (150 mg total) into the muscle every 3 (three) months. 07/26/17 04/22/19  Eveline Lynwood MATSU, MD    Allergies: Patient has no known allergies.    Review of Systems  Constitutional:  Positive for fever.  Genitourinary:  Positive for dysuria.  Musculoskeletal:  Positive for back pain.    Updated Vital Signs BP 118/60   Pulse (!) 56   Temp 98 F (36.7 C) (Oral)   Resp 17   Ht 5' 2.5 (1.588 m)   Wt 65.8 kg   LMP 01/08/2024 (Approximate)   SpO2 99%   BMI 26.10 kg/m   Physical Exam Vitals and nursing note reviewed.  Constitutional:      General: She is not in acute distress.    Appearance: She is not toxic-appearing.  HENT:     Head: Normocephalic and atraumatic.  Eyes:     General: No scleral icterus.    Conjunctiva/sclera: Conjunctivae normal.  Cardiovascular:     Rate and Rhythm: Regular rhythm. Tachycardia present.     Pulses: Normal pulses.     Heart sounds: Normal heart sounds.  Pulmonary:     Effort: Pulmonary effort is normal. No respiratory distress.     Breath sounds: Normal breath sounds.  Abdominal:     General: Abdomen is flat. Bowel sounds are normal.     Palpations: Abdomen is soft.     Tenderness: There is abdominal tenderness. There is left CVA tenderness. There is no right CVA tenderness.     Comments: Suprapubic TTP  Skin:    General: Skin is warm and dry.     Findings: No lesion.  Neurological:     General: No focal deficit present.     Mental Status: She is alert and oriented to person, place, and time. Mental status is at baseline.     (all labs ordered are listed, but only abnormal results are displayed) Labs Reviewed  COMPREHENSIVE METABOLIC PANEL WITH GFR - Abnormal; Notable for the following components:      Result Value   Sodium 134 (*)     CO2 20 (*)    Creatinine, Ser 1.03 (*)    Total Protein 8.3 (*)    All other components within normal limits  CBC WITH DIFFERENTIAL/PLATELET - Abnormal; Notable for the following components:   Hemoglobin 11.1 (*)    HCT 34.2 (*)    Neutro Abs 8.9 (*)    All other components within normal limits  URINALYSIS, W/ REFLEX TO CULTURE (INFECTION SUSPECTED) - Abnormal; Notable for the following components:   Specific Gravity, Urine >1.046 (*)    Hgb urine dipstick MODERATE (*)    Ketones, ur 5 (*)    Protein, ur 30 (*)    Nitrite POSITIVE (*)    Bacteria, UA RARE (*)    All other components within normal limits  PROTIME-INR - Abnormal; Notable for the following components:   Prothrombin Time 20.7 (*)    INR 1.7 (*)    All other components within normal limits  I-STAT CG4 LACTIC ACID, ED - Abnormal; Notable for the following components:   Lactic Acid, Venous 0.4 (*)    All other components within normal limits  RESP PANEL BY RT-PCR (RSV, FLU A&B, COVID)  RVPGX2  CULTURE, BLOOD (ROUTINE X 2)  CULTURE, BLOOD (ROUTINE X 2)  URINE CULTURE  HCG, SERUM, QUALITATIVE  I-STAT CG4 LACTIC ACID, ED    EKG: None  Radiology: CT ABDOMEN PELVIS W CONTRAST Result Date: 02/14/2024 CLINICAL DATA:  UTI, recurrent/complicated (Female) EXAM: CT ABDOMEN AND PELVIS WITH CONTRAST TECHNIQUE: Multidetector CT imaging of the abdomen and pelvis was performed using the standard protocol following bolus administration of intravenous contrast. RADIATION DOSE REDUCTION: This exam was performed according to the departmental dose-optimization program which includes automated exposure control, adjustment of the mA and/or kV according to patient size and/or use of iterative reconstruction technique. CONTRAST:  OMNIPAQUE  IOHEXOL  300 MG/ML  SOLN COMPARISON:  March 16, 2023 FINDINGS: Lower chest: No focal airspace consolidation or pleural effusion. Hepatobiliary: No mass.Focal fatty infiltration along the falciform  ligament.No radiopaque stones or wall thickening of the gallbladder. No intrahepatic or extrahepatic biliary ductal dilation. The portal veins are patent. Pancreas: No mass or main ductal dilation. No peripancreatic inflammation or fluid collection. Spleen: Normal size. No mass. Adrenals/Urinary Tract: No adrenal masses. No renal mass. Left perinephric stranding with regions of wedge-shaped cortical hypoattenuation. No hydronephrosis or nephrolithiasis. Small locule of gas within the nondependent urinary bladder. Stomach/Bowel: The stomach is decompressed without focal abnormality. No small bowel wall thickening or inflammation. No small bowel obstruction.Normal appendix. Vascular/Lymphatic: No aortic aneurysm. No intraabdominal or pelvic lymphadenopathy. Reproductive: The uterus and ovaries are within normal limits for patient's age. Corpus luteal cyst in the  left ovary measuring 2 cm. Small volume free fluid in the pelvis, likely physiologic in a female of this age. Other: No pneumoperitoneum, ascites, or mesenteric inflammation. Musculoskeletal: No acute fracture or destructive lesion. IMPRESSION: 1. Findings concerning for left-sided pyelonephritis. Correlation with urinalysis recommended. 2. A small locule of gas is noted in the urinary bladder, which may be due to recent instrumentation or catheterization. Alternatively, early emphysematous cystitis could also have this appearance. Electronically Signed   By: Rogelia Myers M.D.   On: 02/14/2024 18:25   DG Chest Port 1 View Result Date: 02/14/2024 CLINICAL DATA:  Urinary tract infection, dysuria, sepsis EXAM: PORTABLE CHEST 1 VIEW COMPARISON:  08/10/2023 FINDINGS: Single frontal view of the chest demonstrates an unremarkable cardiac silhouette. No acute airspace disease, effusion, or pneumothorax. No acute bony abnormalities. IMPRESSION: 1. No acute intrathoracic process. Electronically Signed   By: Ozell Daring M.D.   On: 02/14/2024 17:07      .Critical Care  Performed by: Shermon Warren SAILOR, PA-C Authorized by: Shermon Warren SAILOR, PA-C   Critical care provider statement:    Critical care time (minutes):  45   Critical care was necessary to treat or prevent imminent or life-threatening deterioration of the following conditions:  Sepsis   Critical care was time spent personally by me on the following activities:  Development of treatment plan with patient or surrogate, discussions with consultants, evaluation of patient's response to treatment, examination of patient, ordering and review of laboratory studies, ordering and review of radiographic studies, ordering and performing treatments and interventions, pulse oximetry, re-evaluation of patient's condition and review of old charts    Medications Ordered in the ED  lactated ringers  infusion ( Intravenous New Bag/Given 02/14/24 1609)  lactated ringers  bolus 1,000 mL (1,000 mLs Intravenous New Bag/Given 02/14/24 1813)    And  lactated ringers  bolus 500 mL (500 mLs Intravenous Not Given 02/14/24 2027)    And  lactated ringers  bolus 250 mL (250 mLs Intravenous Not Given 02/14/24 2027)  HYDROmorphone  (DILAUDID ) injection 0.5 mg (has no administration in time range)  cefTRIAXone  (ROCEPHIN ) 2 g in sodium chloride  0.9 % 100 mL IVPB (0 g Intravenous Stopped 02/14/24 1906)  morphine  (PF) 4 MG/ML injection 4 mg (4 mg Intravenous Given 02/14/24 1612)  ondansetron  (ZOFRAN ) injection 4 mg (4 mg Intravenous Given 02/14/24 1611)  acetaminophen  (TYLENOL ) tablet 500 mg (500 mg Oral Given 02/14/24 1613)  ibuprofen  (ADVIL ) tablet 600 mg (600 mg Oral Given 02/14/24 1613)  iohexol  (OMNIPAQUE ) 300 MG/ML solution 100 mL (100 mLs Intravenous Contrast Given 02/14/24 1709)  morphine  (PF) 4 MG/ML injection 4 mg (4 mg Intravenous Given 02/14/24 1812)                                    Medical Decision Making Amount and/or Complexity of Data Reviewed Labs: ordered. Radiology: ordered.  Risk OTC  drugs. Prescription drug management. Decision regarding hospitalization.   This patient presents to the ED for concern of abdominal pain, this involves an extensive number of treatment options, and is a complaint that carries with it a high risk of complications and morbidity.  The differential diagnosis includes cholecystitis, AAA, appendicitis, renal stone, UTI   Co morbidities that complicate the patient evaluation  History of pyelonephritis   Additional history obtained:  Additional history obtained from was seen in the emergency room at 12/05/2023 for pyelonephritis... 01/10/24 urine culture shows growth of E. coli that  is sensitive to Rocephin .   Lab Tests:  I personally interpreted labs.  The pertinent results include:   Cbc hemoglobin supplement 10.5, CMP with creatinine of 1.03 Blood cultures, lactic obtained.    Imaging Studies ordered:  I ordered imaging studies including chest x-ray, CT abd/pelvis   I independently visualized and interpreted imaging which showed sided pyelonephritis and findings consistent with early emphysematous cystitis.  I agree with the radiologist interpretation   Cardiac Monitoring: / EKG:  The patient was maintained on a cardiac monitor.  I personally viewed and interpreted the cardiac monitored which showed an underlying rhythm of: sinus tachycardia    Consultations Obtained:  I requested consultation with the hospitalist,  and discussed lab and imaging findings as well as pertinent plan.   Problem List / ED Course / Critical interventions / Medication management  Patient arrives to emergency room complaining of dysuria and suprapubic pain and bilateral flank pain.  On arrival she is tachycardic at rate 127 and febrile at 103.2 F.  She was initiated as a code sepsis workup due to vital signs and suspected infection.  She denies any cough/URI-like symptoms.  She does have reproducible tenderness in suprapubic area as well as left-sided  CVA tenderness.  She has been given saline bolus and antibiotic for presumed UTI.  Will obtain chest x-ray, CT scan of abdomen and pelvis as well as blood cultures and a lactic.  Patient's CT scan showing evidence of left-sided pyelonephritis as well as small locule of gas.  I ordered medication including pain medicine x3, rocephin , 2L NS  Reevaluation of the patient after these medicines showed that the patient improved I have reviewed the patients home medicines and have made adjustments as needed      Final diagnoses:  Sepsis, due to unspecified organism, unspecified whether acute organ dysfunction present [facility])  Pyelonephritis    ED Discharge Orders     None          Shermon Warren LOISE DEVONNA 02/14/24 2048    Armenta Canning, MD 02/18/24 [phone]

## 2024-02-14 NOTE — Progress Notes (Signed)
 Elink is following Code sepsis.

## 2024-02-15 DIAGNOSIS — N12 Tubulo-interstitial nephritis, not specified as acute or chronic: Secondary | ICD-10-CM

## 2024-02-15 LAB — BLOOD CULTURE ID PANEL (REFLEXED) - BCID2

## 2024-02-15 LAB — COMPREHENSIVE METABOLIC PANEL WITH GFR
ALT: 8 U/L (ref 0–44)
AST: 10 U/L — ABNORMAL LOW (ref 15–41)
Albumin: 3.6 g/dL (ref 3.5–5.0)
Alkaline Phosphatase: 57 U/L (ref 38–126)
Anion gap: 9 (ref 5–15)
BUN: 11 mg/dL (ref 6–20)
CO2: 23 mmol/L (ref 22–32)
Calcium: 8.4 mg/dL — ABNORMAL LOW (ref 8.9–10.3)
Chloride: 103 mmol/L (ref 98–111)
Creatinine, Ser: 0.9 mg/dL (ref 0.44–1.00)
GFR, Estimated: >60 mL/min (ref >60)
Glucose, Bld: 122 mg/dL — ABNORMAL HIGH (ref 70–99)
Potassium: 3.6 mmol/L (ref 3.5–5.1)
Sodium: 135 mmol/L (ref 135–145)
Total Bilirubin: 0.7 mg/dL (ref 0.0–1.2)
Total Protein: 6.8 g/dL (ref 6.5–8.1)

## 2024-02-15 LAB — HIV ANTIBODY (ROUTINE TESTING W REFLEX): HIV Screen 4th Generation wRfx: NONREACTIVE

## 2024-02-15 LAB — CBC
HCT: 30.2 % — ABNORMAL LOW (ref 36.0–46.0)
Hemoglobin: 9.5 g/dL — ABNORMAL LOW (ref 12.0–15.0)
MCH: 26.2 pg (ref 26.0–34.0)
MCHC: 31.5 g/dL (ref 30.0–36.0)
MCV: 83.4 fL (ref 80.0–100.0)
Platelets: 276 K/uL (ref 150–400)
RBC: 3.62 MIL/uL — ABNORMAL LOW (ref 3.87–5.11)
RDW: 14.4 % (ref 11.5–15.5)
WBC: 11.9 K/uL — ABNORMAL HIGH (ref 4.0–10.5)
nRBC: 0 % (ref 0.0–0.2)

## 2024-02-15 LAB — LACTIC ACID, PLASMA: Lactic Acid, Venous: 1 mmol/L (ref 0.5–1.9)

## 2024-02-15 MED ORDER — MORPHINE SULFATE (PF) 2 MG/ML IV SOLN
2.0000 mg | Freq: Once | INTRAVENOUS | Status: AC
  Administered 2024-02-15: 2 mg via INTRAVENOUS
  Filled 2024-02-15: qty 1

## 2024-02-15 MED ORDER — IBUPROFEN 400 MG PO TABS
400.0000 mg | ORAL_TABLET | Freq: Once | ORAL | Status: AC
  Administered 2024-02-15: 400 mg via ORAL
  Filled 2024-02-15: qty 1

## 2024-02-15 MED ORDER — LACTATED RINGERS IV BOLUS
500.0000 mL | Freq: Once | INTRAVENOUS | Status: AC
  Administered 2024-02-15: 500 mL via INTRAVENOUS

## 2024-02-15 MED ORDER — MORPHINE SULFATE (PF) 2 MG/ML IV SOLN
2.0000 mg | INTRAVENOUS | Status: DC | PRN
  Administered 2024-02-15 – 2024-02-16 (×8): 2 mg via INTRAVENOUS
  Filled 2024-02-15 (×8): qty 1

## 2024-02-15 NOTE — Plan of Care (Signed)

## 2024-02-15 NOTE — Progress Notes (Signed)
 PHARMACY - PHYSICIAN COMMUNICATION CRITICAL VALUE ALERT - BLOOD CULTURE IDENTIFICATION (BCID)  Sharon Johnston is an 33 y.o. female who presented to [facility] on 02/14/2024 with a chief complaint of bladder pain, dysuria, and pain in the lower back   Assessment:  BCID: 1 of 3 bottles GPC = staph epi, no resistance. could be contaminant. pt on ceftriaxone  for pyelo. If want to treat Staph epi> rec cefazolin 2 gm q8hrs per BCID   Name of physician (or Provider) Contacted: K Ghimire via secure chat  Current antibiotics: ceftriaxone  2 gm IV q24  Changes to prescribed antibiotics recommended:  continue on ceftriaxone  per MD    Results for orders placed or performed during the hospital encounter of 02/14/24  Blood Culture ID Panel (Reflexed) (Collected: 02/14/2024  3:52 PM)  Result Value Ref Range   Enterococcus faecalis NOT DETECTED NOT DETECTED   Enterococcus Faecium NOT DETECTED NOT DETECTED   Listeria monocytogenes NOT DETECTED NOT DETECTED   Staphylococcus species DETECTED (A) NOT DETECTED   Staphylococcus aureus (BCID) NOT DETECTED NOT DETECTED   Staphylococcus epidermidis DETECTED (A) NOT DETECTED   Staphylococcus lugdunensis NOT DETECTED NOT DETECTED   Streptococcus species NOT DETECTED NOT DETECTED   Streptococcus agalactiae NOT DETECTED NOT DETECTED   Streptococcus pneumoniae NOT DETECTED NOT DETECTED   Streptococcus pyogenes NOT DETECTED NOT DETECTED   A.calcoaceticus-baumannii NOT DETECTED NOT DETECTED   Bacteroides fragilis NOT DETECTED NOT DETECTED   Enterobacterales NOT DETECTED NOT DETECTED   Enterobacter cloacae complex NOT DETECTED NOT DETECTED   Escherichia coli NOT DETECTED NOT DETECTED   Klebsiella aerogenes NOT DETECTED NOT DETECTED   Klebsiella oxytoca NOT DETECTED NOT DETECTED   Klebsiella pneumoniae NOT DETECTED NOT DETECTED   Proteus species NOT DETECTED NOT DETECTED   Salmonella species NOT DETECTED NOT DETECTED   Serratia marcescens NOT DETECTED NOT  DETECTED   Haemophilus influenzae NOT DETECTED NOT DETECTED   Neisseria meningitidis NOT DETECTED NOT DETECTED   Pseudomonas aeruginosa NOT DETECTED NOT DETECTED   Stenotrophomonas maltophilia NOT DETECTED NOT DETECTED   Candida albicans NOT DETECTED NOT DETECTED   Candida auris NOT DETECTED NOT DETECTED   Candida glabrata NOT DETECTED NOT DETECTED   Candida krusei NOT DETECTED NOT DETECTED   Candida parapsilosis NOT DETECTED NOT DETECTED   Candida tropicalis NOT DETECTED NOT DETECTED   Cryptococcus neoformans/gattii NOT DETECTED NOT DETECTED   Methicillin resistance mecA/C NOT DETECTED NOT DETECTED   Rosaline IVAR Edison, Pharm.D Use secure chat for questions 02/15/2024 5:27 PM

## 2024-02-15 NOTE — Progress Notes (Signed)
 PROGRESS NOTE    Sharon Johnston  FMW:[id number] DOB: 07/24/1990 DOA: 02/14/2024 PCP: [place]    Brief Narrative:  33 year old previous history of UTI, hypertension, depression presented with 2 days of suprapubic pain, left flank pain and fever at home along with dysuria.  In the emergency room temperature 103.2, heart rate 127.  UA abnormal.  CT scan concerning for left-sided pyelonephritis and emphysematous cystitis.  Started on antibiotics and admitted to the hospital.  Subjective: Patient seen and examined.  She has her child with her.  Complains of ongoing discomfort on the left flank.  Tmax 103.2 last 24 hours. Assessment & Plan:   Sepsis present on admission secondary to left-sided pyelonephritis and UTI: Presented with fever, tachycardia and leukocytosis.  Improving. Blood cultures and urine cultures pending. Continue Rocephin  today.  Adequate IV fluids, pain medications and mobility.  Chronic medical issues including Anxiety depression, resume home medications.  Hypertension: Holding antihypertensives with acute infection and risk of hypotension.  Blood pressure adequate today.  DVT prophylaxis: enoxaparin  (LOVENOX ) injection 40 mg Start: 02/15/24 1000   Code Status: Full code  Family Communication: None Disposition Plan: Status is: Observation The patient will require care spanning > 2 midnights and should be moved to inpatient because: IV antibiotics, IV fluids     Consultants:  None  Procedures:  None  Antimicrobials:  Rocephin  11/26---     Objective: Vitals:   02/15/24 0048 02/15/24 0244 02/15/24 0454 02/15/24 0944  BP: (!) 156/92 127/71 136/81 125/69  Pulse: (!) 104 96 93 65  Resp: 16 16 17 16   Temp: (!) 102.8 F (39.3 C) (!) 101.7 F (38.7 C) (!) 101.4 F (38.6 C) 99.4 F (37.4 C)  TempSrc: Oral   Oral  SpO2: 100% 98% 100% 100%  Weight:      Height:        Intake/Output Summary (Last 24 hours) at 02/15/2024  1057 Last data filed at 02/15/2024 1000 Gross per 24 hour  Intake 3233.67 ml  Output --  Net 3233.67 ml   Filed Weights   02/14/24 1717  Weight: 65.8 kg    Examination:  General exam: Appears calm.  Mildly anxious.  Afebrile. Respiratory system: Clear to auscultation. Respiratory effort normal. Cardiovascular system: S1 & S2 heard, RRR.  No pedal edema. Gastrointestinal system: Abdomen is nondistended, soft.  Patient reports mild tenderness on left flank exam and left lateral abdominal exam.  No rigidity or guarding.  Bowel sound present.  No suprapubic tenderness. Central nervous system: Alert and oriented. No focal neurological deficits. Extremities: Symmetric 5 x 5 power.    Data Reviewed: I have personally reviewed following labs and imaging studies  CBC: Recent Labs  Lab 02/14/24 1428 02/15/24 0134  WBC 10.5 11.9*  NEUTROABS 8.9*  --   HGB 11.1* 9.5*  HCT 34.2* 30.2*  MCV 81.2 83.4  PLT 368 276   Basic Metabolic Panel: Recent Labs  Lab 02/14/24 1428 02/15/24 0134  NA 134* 135  K 3.8 3.6  CL 102 103  CO2 20* 23  GLUCOSE 97 122*  BUN 13 11  CREATININE 1.03* 0.90  CALCIUM  9.3 8.4*   GFR: Estimated Creatinine Clearance: 82.4 mL/min (by C-G formula based on SCr of 0.9 mg/dL). Liver Function Tests: Recent Labs  Lab 02/14/24 1428 02/15/24 0134  AST 21 10*  ALT 9 8  ALKPHOS 67 57  BILITOT 1.1 0.7  PROT 8.3* 6.8  ALBUMIN 4.6 3.6   No results for input(s): LIPASE,  AMYLASE in the last 168 hours. No results for input(s): AMMONIA in the last 168 hours. Coagulation Profile: Recent Labs  Lab 02/14/24 1558  INR 1.7*   Cardiac Enzymes: No results for input(s): CKTOTAL, CKMB, CKMBINDEX, TROPONINI in the last 168 hours. BNP (last 3 results) No results for input(s): PROBNP in the last 8760 hours. HbA1C: No results for input(s): HGBA1C in the last 72 hours. CBG: No results for input(s): GLUCAP in the last 168 hours. Lipid  Profile: No results for input(s): CHOL, HDL, LDLCALC, TRIG, CHOLHDL, LDLDIRECT in the last 72 hours. Thyroid Function Tests: No results for input(s): TSH, T4TOTAL, FREET4, T3FREE, THYROIDAB in the last 72 hours. Anemia Panel: No results for input(s): VITAMINB12, FOLATE, FERRITIN, TIBC, IRON, RETICCTPCT in the last 72 hours. Sepsis Labs: Recent Labs  Lab 02/14/24 1759 02/15/24 0447  LATICACIDVEN 0.4* 1.0    Recent Results (from the past 240 hours)  Blood Culture (routine x 2)     Status: None (Preliminary result)   Collection Time: 02/14/24  3:52 PM   Specimen: BLOOD  Result Value Ref Range Status   Specimen Description   Final    BLOOD SITE NOT SPECIFIED Performed at [facility], [address], [place], [place] [address]    Special Requests   Final    BOTTLES DRAWN AEROBIC AND ANAEROBIC Blood Culture adequate volume Performed at [facility], [address] [place], [place] [address]    Culture   Final    NO GROWTH < 12 HOURS Performed at [facility], [address], [place], [place] [address]    Report Status PENDING  Incomplete  Resp panel by RT-PCR (RSV, Flu A&B, Covid) Anterior Nasal Swab     Status: None   Collection Time: 02/14/24  4:36 PM   Specimen: Anterior Nasal Swab  Result Value Ref Range Status   SARS Coronavirus 2 by RT PCR NEGATIVE NEGATIVE Final    Comment: (NOTE) SARS-CoV-2 target nucleic acids are NOT DETECTED.  The SARS-CoV-2 RNA is generally detectable in upper respiratory specimens during the acute phase of infection. The lowest concentration of SARS-CoV-2 viral copies this assay can detect is 138 copies/mL. A negative result does not preclude SARS-Cov-2 infection and should not be used as the sole basis for treatment or other patient management decisions. A negative result may occur with  improper specimen collection/handling, submission of specimen other than  nasopharyngeal swab, presence of viral mutation(s) within the areas targeted by this assay, and inadequate number of viral copies(<138 copies/mL). A negative result must be combined with clinical observations, patient history, and epidemiological information. The expected result is Negative.  Fact Sheet for Patients:  bloggercourse.com  Fact Sheet for Healthcare Providers:  seriousbroker.it  This test is no t yet approved or cleared by the [place]  FDA and  has been authorized for detection and/or diagnosis of SARS-CoV-2 by FDA under an Emergency Use Authorization (EUA). This EUA will remain  in effect (meaning this test can be used) for the duration of the COVID-19 declaration under Section 564(b)(1) of the Act, 21 U.S.C.section 360bbb-3(b)(1), unless the authorization is terminated  or revoked sooner.       Influenza A by PCR NEGATIVE NEGATIVE Final   Influenza B by PCR NEGATIVE NEGATIVE Final    Comment: (NOTE) The Xpert Xpress SARS-CoV-2/FLU/RSV plus assay is intended as an aid in the diagnosis of influenza from Nasopharyngeal swab specimens and should not be used as a sole basis for treatment.  Nasal washings and aspirates are unacceptable for Xpert Xpress SARS-CoV-2/FLU/RSV testing.  Fact Sheet for Patients: bloggercourse.com  Fact Sheet for Healthcare Providers: seriousbroker.it  This test is not yet approved or cleared by the [place]  FDA and has been authorized for detection and/or diagnosis of SARS-CoV-2 by FDA under an Emergency Use Authorization (EUA). This EUA will remain in effect (meaning this test can be used) for the duration of the COVID-19 declaration under Section 564(b)(1) of the Act, 21 U.S.C. section 360bbb-3(b)(1), unless the authorization is terminated or revoked.     Resp Syncytial Virus by PCR NEGATIVE NEGATIVE Final    Comment:  (NOTE) Fact Sheet for Patients: bloggercourse.com  Fact Sheet for Healthcare Providers: seriousbroker.it  This test is not yet approved or cleared by the [place]  FDA and has been authorized for detection and/or diagnosis of SARS-CoV-2 by FDA under an Emergency Use Authorization (EUA). This EUA will remain in effect (meaning this test can be used) for the duration of the COVID-19 declaration under Section 564(b)(1) of the Act, 21 U.S.C. section 360bbb-3(b)(1), unless the authorization is terminated or revoked.  Performed at [facility], [address], [place], [place] [address]   Blood Culture (routine x 2)     Status: None (Preliminary result)   Collection Time: 02/15/24  1:34 AM   Specimen: BLOOD LEFT ARM  Result Value Ref Range Status   Specimen Description   Final    BLOOD LEFT ARM Performed at [facility], [address] [place], [place] [address]    Special Requests   Final    BOTTLES DRAWN AEROBIC ONLY Blood Culture adequate volume Performed at [facility], [address] [place], [place] [address]    Culture   Final    NO GROWTH < 12 HOURS Performed at [facility], [address], [place], [place] [address]    Report Status PENDING  Incomplete         Radiology Studies: CT ABDOMEN PELVIS W CONTRAST Result Date: 02/14/2024 CLINICAL DATA:  UTI, recurrent/complicated (Female) EXAM: CT ABDOMEN AND PELVIS WITH CONTRAST TECHNIQUE: Multidetector CT imaging of the abdomen and pelvis was performed using the standard protocol following bolus administration of intravenous contrast. RADIATION DOSE REDUCTION: This exam was performed according to the departmental dose-optimization program which includes automated exposure control, adjustment of the mA and/or kV according to patient size and/or use of iterative reconstruction technique. CONTRAST:  OMNIPAQUE  IOHEXOL   300 MG/ML  SOLN COMPARISON:  March 16, 2023 FINDINGS: Lower chest: No focal airspace consolidation or pleural effusion. Hepatobiliary: No mass.Focal fatty infiltration along the falciform ligament.No radiopaque stones or wall thickening of the gallbladder. No intrahepatic or extrahepatic biliary ductal dilation. The portal veins are patent. Pancreas: No mass or main ductal dilation. No peripancreatic inflammation or fluid collection. Spleen: Normal size. No mass. Adrenals/Urinary Tract: No adrenal masses. No renal mass. Left perinephric stranding with regions of wedge-shaped cortical hypoattenuation. No hydronephrosis or nephrolithiasis. Small locule of gas within the nondependent urinary bladder. Stomach/Bowel: The stomach is decompressed without focal abnormality. No small bowel wall thickening or inflammation. No small bowel obstruction.Normal appendix. Vascular/Lymphatic: No aortic aneurysm. No intraabdominal or pelvic lymphadenopathy. Reproductive: The uterus and ovaries are within normal limits for patient's age. Corpus luteal cyst in the left ovary measuring 2 cm. Small volume free fluid in the pelvis, likely physiologic in a female of this age. Other: No pneumoperitoneum, ascites, or mesenteric inflammation. Musculoskeletal: No acute fracture or destructive  lesion. IMPRESSION: 1. Findings concerning for left-sided pyelonephritis. Correlation with urinalysis recommended. 2. A small locule of gas is noted in the urinary bladder, which may be due to recent instrumentation or catheterization. Alternatively, early emphysematous cystitis could also have this appearance. Electronically Signed   By: Rogelia Myers M.D.   On: 02/14/2024 18:25   DG Chest Port 1 View Result Date: 02/14/2024 CLINICAL DATA:  Urinary tract infection, dysuria, sepsis EXAM: PORTABLE CHEST 1 VIEW COMPARISON:  08/10/2023 FINDINGS: Single frontal view of the chest demonstrates an unremarkable cardiac silhouette. No acute airspace  disease, effusion, or pneumothorax. No acute bony abnormalities. IMPRESSION: 1. No acute intrathoracic process. Electronically Signed   By: Ozell Daring M.D.   On: 02/14/2024 17:07        Scheduled Meds:  enoxaparin  (LOVENOX ) injection  40 mg Subcutaneous Q24H   sertraline   50 mg Oral Daily   Continuous Infusions:  sodium chloride  Stopped (02/15/24 0336)   cefTRIAXone  (ROCEPHIN )  IV     lactated ringers      And   lactated ringers      lactated ringers  150 mL/hr at 02/15/24 0645     LOS: 0 days       Renato Applebaum, MD [facility]

## 2024-02-15 NOTE — Plan of Care (Signed)
   Problem: Education: Goal: Knowledge of General Education information will improve Description Including pain rating scale, medication(s)/side effects and non-pharmacologic comfort measures Outcome: Progressing

## 2024-02-16 ENCOUNTER — Encounter (HOSPITAL_COMMUNITY): Payer: Self-pay | Admitting: Internal Medicine

## 2024-02-16 DIAGNOSIS — N12 Tubulo-interstitial nephritis, not specified as acute or chronic: Secondary | ICD-10-CM | POA: Diagnosis not present

## 2024-02-16 LAB — HIV ANTIBODY (ROUTINE TESTING W REFLEX): HIV Screen 4th Generation wRfx: NONREACTIVE

## 2024-02-16 MED ORDER — CEFADROXIL 500 MG PO CAPS: 500.0000 mg | ORAL_CAPSULE | Freq: Two times a day (BID) | ORAL | 0 refills | Status: AC

## 2024-02-16 MED ORDER — HYDROCODONE-ACETAMINOPHEN 5-325 MG PO TABS
1.0000 | ORAL_TABLET | Freq: Four times a day (QID) | ORAL | 0 refills | Status: AC | PRN
Start: 2024-02-16 — End: 2024-02-19

## 2024-02-16 NOTE — Progress Notes (Signed)
 Patient discharged home, IV removed, discharge paperwork provided and explained, patient verbalized understanding.

## 2024-02-16 NOTE — Discharge Summary (Signed)
 Physician Discharge Summary  Sharon Johnston FMW:[id number] DOB: 05/19/1990 DOA: 02/14/2024  PCP: [place]  Admit date: 02/14/2024 Discharge date: 02/16/2024  Admitted From: Home Disposition: Home  Recommendations for Outpatient Follow-up:  Follow up with PCP in 1-2 weeks Please follow up on the following pending results: HIV, urine GC and chlamydia  Home Health: N/A Equipment/Devices: N/A  Discharge Condition: Stable CODE STATUS: Full code Diet recommendation: Low-salt diet  Discharge summary: 33 year old with history of UTIs, hypertension and depression presented with 2 days of suprapubic pain, left flank pain and temperature 103 along with dysuria at home.  At the emergency room temperature 103, heart rate 127.  UA was abnormal.  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  She was given IV fluid, started on IV Rocephin  and admitted to the hospital.  Sepsis present on admission, likely secondary to left-sided pyonephritis and UTI: Presented with fever, tachycardia and leukocytosis that is improving Blood cultures, 1 out of 3 bottles Staph epidermidis likely contaminant Urine cultures, 20,000 colonies of Staph epidermidis, unknown significance Fever curve improving, some clinical improvement today.  Rocephin  2 g IV x 3 days today. Patient requested to be discharged.  Case discussed with infectious disease.  Discharging home with 7 days of cefadroxil  to complete total 10 days of therapy to treat for pyelonephritis.  Also prescribed a short course of pain medication. Collecting urine GC and chlamydia-Will follow-up results. Collecting HIV serology-will follow-up results.  Resume all long-term medications including blood pressure medication.    Discharge Diagnoses:  Principal Problem:   Pyelonephritis    Discharge Instructions  Discharge Instructions     Diet - low sodium heart healthy    Complete by: As directed    Increase activity slowly   Complete by: As directed       Allergies as of 02/16/2024   No Known Allergies      Medication List     STOP taking these medications    doxycycline  100 MG capsule Commonly known as: VIBRAMYCIN    hydrOXYzine  25 MG tablet Commonly known as: ATARAX        TAKE these medications    amLODipine  5 MG tablet Commonly known as: NORVASC  Take 1 tablet (5 mg total) by mouth daily.   Blood Pressure Kit Devi 1 kit by Does not apply route as needed.   cefadroxil  500 MG capsule Commonly known as: DURICEF Take 1 capsule (500 mg total) by mouth 2 (two) times daily for 7 days.   HYDROcodone -acetaminophen  5-325 MG tablet Commonly known as: NORCO/VICODIN Take 1 tablet by mouth every 6 (six) hours as needed for up to 3 days for moderate pain (pain score 4-6) or severe pain (pain score 7-10).   norethindrone 0.35 MG tablet Commonly known as: MICRONOR Take 1 tablet by mouth daily.   sertraline  50 MG tablet Commonly known as: ZOLOFT  Take 1 tablet (50 mg total) by mouth daily. 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        No Known Allergies  Consultations: ID, curbside   Procedures/Studies: CT ABDOMEN PELVIS W CONTRAST Result Date: 02/14/2024 CLINICAL DATA:  UTI, recurrent/complicated (Female) EXAM: CT ABDOMEN AND PELVIS WITH CONTRAST TECHNIQUE: Multidetector CT imaging of the abdomen and pelvis was performed using the standard protocol following bolus administration of  intravenous contrast. RADIATION DOSE REDUCTION: This exam was performed according to the departmental dose-optimization program which includes automated exposure control, adjustment of the mA and/or kV according to patient size and/or use of iterative reconstruction technique. CONTRAST:  OMNIPAQUE   IOHEXOL  300 MG/ML  SOLN COMPARISON:  March 16, 2023 FINDINGS: Lower chest: No focal airspace consolidation or pleural effusion. Hepatobiliary: No mass.Focal fatty infiltration along the falciform ligament.No radiopaque stones or wall thickening of the gallbladder. No intrahepatic or extrahepatic biliary ductal dilation. The portal veins are patent. Pancreas: No mass or main ductal dilation. No peripancreatic inflammation or fluid collection. Spleen: Normal size. No mass. Adrenals/Urinary Tract: No adrenal masses. No renal mass. Left perinephric stranding with regions of wedge-shaped cortical hypoattenuation. No hydronephrosis or nephrolithiasis. Small locule of gas within the nondependent urinary bladder. Stomach/Bowel: The stomach is decompressed without focal abnormality. No small bowel wall thickening or inflammation. No small bowel obstruction.Normal appendix. Vascular/Lymphatic: No aortic aneurysm. No intraabdominal or pelvic lymphadenopathy. Reproductive: The uterus and ovaries are within normal limits for patient's age. Corpus luteal cyst in the left ovary measuring 2 cm. Small volume free fluid in the pelvis, likely physiologic in a female of this age. Other: No pneumoperitoneum, ascites, or mesenteric inflammation. Musculoskeletal: No acute fracture or destructive lesion. IMPRESSION: 1. Findings concerning for left-sided pyelonephritis. Correlation with urinalysis recommended. 2. A small locule of gas is noted in the urinary bladder, which may be due to recent instrumentation or catheterization. Alternatively, early emphysematous cystitis could also have this appearance. Electronically Signed   By: Rogelia Myers M.D.   On: 02/14/2024 18:25   DG Chest Port 1 View Result Date: 02/14/2024 CLINICAL DATA:  Urinary tract infection, dysuria, sepsis EXAM: PORTABLE CHEST 1 VIEW COMPARISON:  08/10/2023 FINDINGS: Single frontal view of the chest demonstrates an unremarkable cardiac silhouette. No acute  airspace disease, effusion, or pneumothorax. No acute bony abnormalities. IMPRESSION: 1. No acute intrathoracic process. Electronically Signed   By: Ozell Daring M.D.   On: 02/14/2024 17:07   (Echo, Carotid, EGD, Colonoscopy, ERCP)    Subjective: Patient seen and examined in the morning rounds.  She had low-grade fever overnight 100.6.  Patient had 1 child with her in the hospital.  She has 2 children at home and she must go home today.  Patient told me that she can manage her pain with oral pain medication.  Today all day, she had temperature maximum of 99.1.  Discussed about results.  Patient requested to be discharged, discharged with antibiotics and advised to seek help for any worsening symptoms including high fever, chills.   Discharge Exam: Vitals:   02/16/24 1207 02/16/24 1338  BP:  (!) 160/93  Pulse:  80  Resp:  18  Temp: 98.9 F (37.2 C) 99.1 F (37.3 C)  SpO2:  98%   Vitals:   02/16/24 0520 02/16/24 1030 02/16/24 1207 02/16/24 1338  BP: (!) 144/77   (!) 160/93  Pulse: 87   80  Resp: 18   18  Temp: (!) 100.6 F (38.1 C) 99 F (37.2 C) 98.9 F (37.2 C) 99.1 F (37.3 C)  TempSrc: Oral Oral Oral Oral  SpO2: 99%   98%  Weight:      Height:        General: Pt is alert, awake, not in acute distress Cardiovascular: RRR, S1/S2 +, no rubs, no gallops Respiratory: CTA bilaterally, no wheezing, no rhonchi Abdominal: Soft, subjective tenderness on deep palpation left lateral quadrant and flank, no rigidity, no guarding.  ND, bowel sounds + Extremities: no edema, no cyanosis    The results of significant diagnostics from this hospitalization (including imaging, microbiology, ancillary and laboratory) are listed below for reference.     Microbiology: Recent Results (from the past 240 hours)  Blood Culture (routine x 2)     Status: Abnormal (Preliminary result)   Collection Time: 02/14/24  3:52 PM   Specimen: BLOOD  Result Value Ref Range Status   Specimen Description    Final    BLOOD SITE NOT SPECIFIED Performed at [facility], [address], [place], [place] [address]    Special Requests   Final    BOTTLES DRAWN AEROBIC AND ANAEROBIC Blood Culture adequate volume Performed at [facility], [address] [place], [place] [address]    Culture  Setup Time   Final    GRAM POSITIVE COCCI ANAEROBIC BOTTLE ONLY CRITICAL RESULT CALLED TO, READ BACK BY AND VERIFIED WITH: PHARMD M. BELL [id number] @ 1701 FH    Culture (A)  Final    STAPHYLOCOCCUS EPIDERMIDIS THE SIGNIFICANCE OF ISOLATING THIS ORGANISM FROM A SINGLE SET OF BLOOD CULTURES WHEN MULTIPLE SETS ARE DRAWN IS UNCERTAIN. PLEASE NOTIFY THE MICROBIOLOGY DEPARTMENT WITHIN ONE WEEK IF SPECIATION AND SENSITIVITIES ARE REQUIRED. Performed at [facility], [address], [place], [place] [address]    Report Status PENDING  Incomplete  Blood Culture ID Panel (Reflexed)     Status: Abnormal   Collection Time: 02/14/24  3:52 PM  Result Value Ref Range Status   Enterococcus faecalis NOT DETECTED NOT DETECTED Final   Enterococcus Faecium NOT DETECTED NOT DETECTED Final   Listeria monocytogenes NOT DETECTED NOT DETECTED Final   Staphylococcus species DETECTED (A) NOT DETECTED Final    Comment: CRITICAL RESULT CALLED TO, READ BACK BY AND VERIFIED WITH: PHARMD M. BELL [id number] @ 1701 FH    Staphylococcus aureus (BCID) NOT DETECTED NOT DETECTED Final   Staphylococcus epidermidis DETECTED (A) NOT DETECTED Final    Comment: CRITICAL RESULT CALLED TO, READ BACK BY AND VERIFIED WITH: PHARMD M. BELL [id number] @ 1701 FH    Staphylococcus lugdunensis NOT DETECTED NOT DETECTED Final   Streptococcus species NOT DETECTED NOT DETECTED Final   Streptococcus agalactiae NOT DETECTED NOT DETECTED Final   Streptococcus pneumoniae NOT DETECTED NOT DETECTED Final   Streptococcus pyogenes NOT DETECTED NOT DETECTED Final   A.calcoaceticus-baumannii NOT DETECTED NOT DETECTED Final    Bacteroides fragilis NOT DETECTED NOT DETECTED Final   Enterobacterales NOT DETECTED NOT DETECTED Final   Enterobacter cloacae complex NOT DETECTED NOT DETECTED Final   Escherichia coli NOT DETECTED NOT DETECTED Final   Klebsiella aerogenes NOT DETECTED NOT DETECTED Final   Klebsiella oxytoca NOT DETECTED NOT DETECTED Final   Klebsiella pneumoniae NOT DETECTED NOT DETECTED Final   Proteus species NOT DETECTED NOT DETECTED Final   Salmonella species NOT DETECTED NOT DETECTED Final   Serratia marcescens NOT DETECTED NOT DETECTED Final   Haemophilus influenzae NOT DETECTED NOT DETECTED Final   Neisseria meningitidis NOT DETECTED NOT DETECTED Final   Pseudomonas aeruginosa NOT DETECTED NOT DETECTED Final   Stenotrophomonas maltophilia NOT DETECTED NOT DETECTED Final   Candida albicans NOT DETECTED NOT DETECTED Final   Candida auris NOT DETECTED NOT DETECTED Final   Candida glabrata NOT DETECTED NOT DETECTED Final   Candida krusei NOT DETECTED NOT DETECTED Final   Candida parapsilosis NOT DETECTED NOT DETECTED Final   Candida tropicalis NOT DETECTED NOT DETECTED Final  Cryptococcus neoformans/gattii NOT DETECTED NOT DETECTED Final   Methicillin resistance mecA/C NOT DETECTED NOT DETECTED Final    Comment: Performed at [facility], [address] [place], [place] [address]  Resp panel by RT-PCR (RSV, Flu A&B, Covid) Anterior Nasal Swab     Status: None   Collection Time: 02/14/24  4:36 PM   Specimen: Anterior Nasal Swab  Result Value Ref Range Status   SARS Coronavirus 2 by RT PCR NEGATIVE NEGATIVE Final    Comment: (NOTE) SARS-CoV-2 target nucleic acids are NOT DETECTED.  The SARS-CoV-2 RNA is generally detectable in upper respiratory specimens during the acute phase of infection. The lowest concentration of SARS-CoV-2 viral copies this assay can detect is 138 copies/mL. A negative result does not preclude SARS-Cov-2 infection and should not be used as the sole basis for  treatment or other patient management decisions. A negative result may occur with  improper specimen collection/handling, submission of specimen other than nasopharyngeal swab, presence of viral mutation(s) within the areas targeted by this assay, and inadequate number of viral copies(<138 copies/mL). A negative result must be combined with clinical observations, patient history, and epidemiological information. The expected result is Negative.  Fact Sheet for Patients:  bloggercourse.com  Fact Sheet for Healthcare Providers:  seriousbroker.it  This test is no t yet approved or cleared by the [place]  FDA and  has been authorized for detection and/or diagnosis of SARS-CoV-2 by FDA under an Emergency Use Authorization (EUA). This EUA will remain  in effect (meaning this test can be used) for the duration of the COVID-19 declaration under Section 564(b)(1) of the Act, 21 U.S.C.section 360bbb-3(b)(1), unless the authorization is terminated  or revoked sooner.       Influenza A by PCR NEGATIVE NEGATIVE Final   Influenza B by PCR NEGATIVE NEGATIVE Final    Comment: (NOTE) The Xpert Xpress SARS-CoV-2/FLU/RSV plus assay is intended as an aid in the diagnosis of influenza from Nasopharyngeal swab specimens and should not be used as a sole basis for treatment. Nasal washings and aspirates are unacceptable for Xpert Xpress SARS-CoV-2/FLU/RSV testing.  Fact Sheet for Patients: bloggercourse.com  Fact Sheet for Healthcare Providers: seriousbroker.it  This test is not yet approved or cleared by the [place]  FDA and has been authorized for detection and/or diagnosis of SARS-CoV-2 by FDA under an Emergency Use Authorization (EUA). This EUA will remain in effect (meaning this test can be used) for the duration of the COVID-19 declaration under Section 564(b)(1) of the Act, 21  U.S.C. section 360bbb-3(b)(1), unless the authorization is terminated or revoked.     Resp Syncytial Virus by PCR NEGATIVE NEGATIVE Final    Comment: (NOTE) Fact Sheet for Patients: bloggercourse.com  Fact Sheet for Healthcare Providers: seriousbroker.it  This test is not yet approved or cleared by the [place]  FDA and has been authorized for detection and/or diagnosis of SARS-CoV-2 by FDA under an Emergency Use Authorization (EUA). This EUA will remain in effect (meaning this test can be used) for the duration of the COVID-19 declaration under Section 564(b)(1) of the Act, 21 U.S.C. section 360bbb-3(b)(1), unless the authorization is terminated or revoked.  Performed at [facility], [address] [place], [place] [address]   Urine Culture     Status: Abnormal (Preliminary result)   Collection Time: 02/14/24  7:31 PM   Specimen: Urine, Random  Result Value Ref Range Status   Specimen Description   Final    URINE, RANDOM Performed at [facility], [address], [place], [place] [address]    Special Requests   Final    NONE Reflexed from [id number] Performed at [facility], [address], [place], [place] [address]    Culture (A)  Final    20,000 COLONIES/mL STAPHYLOCOCCUS EPIDERMIDIS SUSCEPTIBILITIES TO FOLLOW Performed at [facility], [address], [place], [place] [address]    Report Status PENDING  Incomplete  Blood Culture (routine x 2)     Status: None (Preliminary result)   Collection Time: 02/15/24  1:34 AM   Specimen: BLOOD LEFT ARM  Result Value Ref Range Status   Specimen Description   Final    BLOOD LEFT ARM Performed at [facility], [address], [place], [place] [address]    Special Requests   Final    BOTTLES DRAWN AEROBIC ONLY Blood Culture adequate volume Performed at [facility], [address] [place], [place] [address]    Culture   Final    NO GROWTH 1 DAY Performed at [facility], [address] [place], [place] [address]    Report Status PENDING  Incomplete     Labs: BNP (last 3 results) No results for input(s): BNP in the last 8760 hours. Basic Metabolic Panel: Recent Labs  Lab 02/14/24 1428 02/15/24 0134  NA 134* 135  K 3.8 3.6  CL 102 103  CO2 20* 23  GLUCOSE 97 122*  BUN 13 11  CREATININE 1.03* 0.90  CALCIUM  9.3 8.4*   Liver Function Tests: Recent Labs  Lab 02/14/24 1428 02/15/24 0134  AST 21 10*  ALT 9 8  ALKPHOS 67 57  BILITOT 1.1 0.7  PROT 8.3* 6.8  ALBUMIN 4.6 3.6   No results for input(s): LIPASE, AMYLASE in the last 168 hours. No results for input(s): AMMONIA in the last 168 hours. CBC: Recent Labs  Lab 02/14/24 1428 02/15/24 0134  WBC 10.5 11.9*  NEUTROABS 8.9*  --   HGB 11.1* 9.5*  HCT 34.2* 30.2*  MCV 81.2 83.4  PLT 368 276   Cardiac Enzymes: No results for input(s): CKTOTAL, CKMB, CKMBINDEX, TROPONINI in the last 168 hours. BNP: Invalid input(s): POCBNP CBG: No results for input(s): GLUCAP in the last 168 hours. D-Dimer No results for input(s): DDIMER in the last 72 hours. Hgb A1c No results for input(s): HGBA1C in the last 72 hours. Lipid Profile No results for input(s): CHOL, HDL, LDLCALC, TRIG, CHOLHDL, LDLDIRECT in the last 72 hours. Thyroid function studies No results for input(s): TSH, T4TOTAL, T3FREE, THYROIDAB in the last 72 hours.  Invalid input(s): FREET3 Anemia work up No results for input(s): VITAMINB12, FOLATE, FERRITIN, TIBC, IRON, RETICCTPCT in the last 72 hours. Urinalysis    Component Value Date/Time   COLORURINE YELLOW 02/14/2024 1931   APPEARANCEUR CLEAR 02/14/2024 1931   LABSPEC >1.046 (H) 02/14/2024 1931   PHURINE 5.0 02/14/2024 1931   GLUCOSEU NEGATIVE 02/14/2024 1931   HGBUR MODERATE (A) 02/14/2024 1931   BILIRUBINUR  NEGATIVE 02/14/2024 1931   BILIRUBINUR small (A) 01/10/2024 1127   BILIRUBINUR Neg 06/26/2017 1407   KETONESUR 5 (A) 02/14/2024 1931   PROTEINUR 30 (A) 02/14/2024 1931   UROBILINOGEN 0.2 01/10/2024 1127   UROBILINOGEN 1.0 11/05/2021 0844   NITRITE POSITIVE (A) 02/14/2024 1931   LEUKOCYTESUR NEGATIVE 02/14/2024 1931   Sepsis Labs Recent Labs  Lab 02/14/24 1428 02/15/24 0134  WBC 10.5 11.9*   Microbiology Recent Results (from the past 240  hours)  Blood Culture (routine x 2)     Status: Abnormal (Preliminary result)   Collection Time: 02/14/24  3:52 PM   Specimen: BLOOD  Result Value Ref Range Status   Specimen Description   Final    BLOOD SITE NOT SPECIFIED Performed at [facility], [address], [place], [place] [address]    Special Requests   Final    BOTTLES DRAWN AEROBIC AND ANAEROBIC Blood Culture adequate volume Performed at [facility], [address], [place], [place] [address]    Culture  Setup Time   Final    GRAM POSITIVE COCCI ANAEROBIC BOTTLE ONLY CRITICAL RESULT CALLED TO, READ BACK BY AND VERIFIED WITH: PHARMD M. BELL [id number] @ 1701 FH    Culture (A)  Final    STAPHYLOCOCCUS EPIDERMIDIS THE SIGNIFICANCE OF ISOLATING THIS ORGANISM FROM A SINGLE SET OF BLOOD CULTURES WHEN MULTIPLE SETS ARE DRAWN IS UNCERTAIN. PLEASE NOTIFY THE MICROBIOLOGY DEPARTMENT WITHIN ONE WEEK IF SPECIATION AND SENSITIVITIES ARE REQUIRED. Performed at [facility], [address] [place] [address], [place], [place] [address]    Report Status PENDING  Incomplete  Blood Culture ID Panel (Reflexed)     Status: Abnormal   Collection Time: 02/14/24  3:52 PM  Result Value Ref Range Status   Enterococcus faecalis NOT DETECTED NOT DETECTED Final   Enterococcus Faecium NOT DETECTED NOT DETECTED Final   Listeria monocytogenes NOT DETECTED NOT DETECTED Final   Staphylococcus species DETECTED (A) NOT DETECTED Final    Comment: CRITICAL RESULT CALLED TO, READ BACK BY  AND VERIFIED WITH: PHARMD M. BELL [id number] @ 1701 FH    Staphylococcus aureus (BCID) NOT DETECTED NOT DETECTED Final   Staphylococcus epidermidis DETECTED (A) NOT DETECTED Final    Comment: CRITICAL RESULT CALLED TO, READ BACK BY AND VERIFIED WITH: PHARMD M. BELL [id number] @ 1701 FH    Staphylococcus lugdunensis NOT DETECTED NOT DETECTED Final   Streptococcus species NOT DETECTED NOT DETECTED Final   Streptococcus agalactiae NOT DETECTED NOT DETECTED Final   Streptococcus pneumoniae NOT DETECTED NOT DETECTED Final   Streptococcus pyogenes NOT DETECTED NOT DETECTED Final   A.calcoaceticus-baumannii NOT DETECTED NOT DETECTED Final   Bacteroides fragilis NOT DETECTED NOT DETECTED Final   Enterobacterales NOT DETECTED NOT DETECTED Final   Enterobacter cloacae complex NOT DETECTED NOT DETECTED Final   Escherichia coli NOT DETECTED NOT DETECTED Final   Klebsiella aerogenes NOT DETECTED NOT DETECTED Final   Klebsiella oxytoca NOT DETECTED NOT DETECTED Final   Klebsiella pneumoniae NOT DETECTED NOT DETECTED Final   Proteus species NOT DETECTED NOT DETECTED Final   Salmonella species NOT DETECTED NOT DETECTED Final   Serratia marcescens NOT DETECTED NOT DETECTED Final   Haemophilus influenzae NOT DETECTED NOT DETECTED Final   Neisseria meningitidis NOT DETECTED NOT DETECTED Final   Pseudomonas aeruginosa NOT DETECTED NOT DETECTED Final   Stenotrophomonas maltophilia NOT DETECTED NOT DETECTED Final   Candida albicans NOT DETECTED NOT DETECTED Final   Candida auris NOT DETECTED NOT DETECTED Final   Candida glabrata NOT DETECTED NOT DETECTED Final   Candida krusei NOT DETECTED NOT DETECTED Final   Candida parapsilosis NOT DETECTED NOT DETECTED Final   Candida tropicalis NOT DETECTED NOT DETECTED Final   Cryptococcus neoformans/gattii NOT DETECTED NOT DETECTED Final   Methicillin resistance mecA/C NOT DETECTED NOT DETECTED Final    Comment: Performed at [facility], [address],  [place], [place] [address]  Resp panel by RT-PCR (RSV, Flu A&B,  Covid) Anterior Nasal Swab     Status: None   Collection Time: 02/14/24  4:36 PM   Specimen: Anterior Nasal Swab  Result Value Ref Range Status   SARS Coronavirus 2 by RT PCR NEGATIVE NEGATIVE Final    Comment: (NOTE) SARS-CoV-2 target nucleic acids are NOT DETECTED.  The SARS-CoV-2 RNA is generally detectable in upper respiratory specimens during the acute phase of infection. The lowest concentration of SARS-CoV-2 viral copies this assay can detect is 138 copies/mL. A negative result does not preclude SARS-Cov-2 infection and should not be used as the sole basis for treatment or other patient management decisions. A negative result may occur with  improper specimen collection/handling, submission of specimen other than nasopharyngeal swab, presence of viral mutation(s) within the areas targeted by this assay, and inadequate number of viral copies(<138 copies/mL). A negative result must be combined with clinical observations, patient history, and epidemiological information. The expected result is Negative.  Fact Sheet for Patients:  bloggercourse.com  Fact Sheet for Healthcare Providers:  seriousbroker.it  This test is no t yet approved or cleared by the [place]  FDA and  has been authorized for detection and/or diagnosis of SARS-CoV-2 by FDA under an Emergency Use Authorization (EUA). This EUA will remain  in effect (meaning this test can be used) for the duration of the COVID-19 declaration under Section 564(b)(1) of the Act, 21 U.S.C.section 360bbb-3(b)(1), unless the authorization is terminated  or revoked sooner.       Influenza A by PCR NEGATIVE NEGATIVE Final   Influenza B by PCR NEGATIVE NEGATIVE Final    Comment: (NOTE) The Xpert Xpress SARS-CoV-2/FLU/RSV plus assay is intended as an aid in the diagnosis of influenza from Nasopharyngeal swab specimens  and should not be used as a sole basis for treatment. Nasal washings and aspirates are unacceptable for Xpert Xpress SARS-CoV-2/FLU/RSV testing.  Fact Sheet for Patients: bloggercourse.com  Fact Sheet for Healthcare Providers: seriousbroker.it  This test is not yet approved or cleared by the [place]  FDA and has been authorized for detection and/or diagnosis of SARS-CoV-2 by FDA under an Emergency Use Authorization (EUA). This EUA will remain in effect (meaning this test can be used) for the duration of the COVID-19 declaration under Section 564(b)(1) of the Act, 21 U.S.C. section 360bbb-3(b)(1), unless the authorization is terminated or revoked.     Resp Syncytial Virus by PCR NEGATIVE NEGATIVE Final    Comment: (NOTE) Fact Sheet for Patients: bloggercourse.com  Fact Sheet for Healthcare Providers: seriousbroker.it  This test is not yet approved or cleared by the [place]  FDA and has been authorized for detection and/or diagnosis of SARS-CoV-2 by FDA under an Emergency Use Authorization (EUA). This EUA will remain in effect (meaning this test can be used) for the duration of the COVID-19 declaration under Section 564(b)(1) of the Act, 21 U.S.C. section 360bbb-3(b)(1), unless the authorization is terminated or revoked.  Performed at [facility], [address] [place], [place] [address]   Urine Culture     Status: Abnormal (Preliminary result)   Collection Time: 02/14/24  7:31 PM   Specimen: Urine, Random  Result Value Ref Range Status   Specimen Description   Final    URINE, RANDOM Performed at [facility], [address], [place], [place] [address]    Special Requests   Final    NONE Reflexed from [id number] Performed at [facility], [address] [place], [place] [address]    Culture (A)  Final     20,000 COLONIES/mL STAPHYLOCOCCUS EPIDERMIDIS SUSCEPTIBILITIES TO FOLLOW Performed at [facility], [address] [place], [place] [address]    Report Status PENDING  Incomplete  Blood Culture (routine x 2)     Status: None (Preliminary result)   Collection Time: 02/15/24  1:34 AM   Specimen: BLOOD LEFT ARM  Result Value Ref Range Status   Specimen Description   Final    BLOOD LEFT ARM Performed at [facility], [address] [place], [place] [address]    Special Requests   Final    BOTTLES DRAWN AEROBIC ONLY Blood Culture adequate volume Performed at [facility], [address], [place], [place] [address]    Culture   Final    NO GROWTH 1 DAY Performed at [facility], [address] [place], [place] [address]    Report Status PENDING  Incomplete     Time coordinating discharge: 35 minutes  SIGNED:   Renato Applebaum, MD  [facility] 02/16/2024, 2:41 PM

## 2024-02-16 NOTE — Progress Notes (Signed)
   02/16/24 0929  TOC Brief Assessment  Insurance and Status Reviewed  Patient has primary care physician Yes  Home environment has been reviewed resides in an apartment  Prior level of function: Independent  Prior/Current Home Services No current home services  Social Drivers of Health Review SDOH reviewed no interventions necessary  Readmission risk has been reviewed Yes  Transition of care needs no transition of care needs at this time

## 2024-02-17 LAB — CULTURE, BLOOD (ROUTINE X 2): Special Requests: ADEQUATE

## 2024-02-17 LAB — URINE CULTURE: Culture: 20000 — AB

## 2024-02-19 ENCOUNTER — Encounter (HOSPITAL_COMMUNITY): Payer: Self-pay

## 2024-02-19 ENCOUNTER — Emergency Department (HOSPITAL_COMMUNITY)
Admission: EM | Admit: 2024-02-19 | Discharge: 2024-02-19 | Disposition: A | Discharge status: Home / Self Care | Source: Ambulatory Visit | Attending: Emergency Medicine | Admitting: Emergency Medicine

## 2024-02-19 ENCOUNTER — Other Ambulatory Visit: Payer: Self-pay

## 2024-02-19 DIAGNOSIS — Z79899 Other long term (current) drug therapy: Secondary | ICD-10-CM | POA: Diagnosis not present

## 2024-02-19 DIAGNOSIS — R10A3 Flank pain, bilateral: Secondary | ICD-10-CM | POA: Insufficient documentation

## 2024-02-19 DIAGNOSIS — I10 Essential (primary) hypertension: Secondary | ICD-10-CM | POA: Insufficient documentation

## 2024-02-19 DIAGNOSIS — E876 Hypokalemia: Secondary | ICD-10-CM | POA: Diagnosis not present

## 2024-02-19 DIAGNOSIS — D72819 Decreased white blood cell count, unspecified: Secondary | ICD-10-CM | POA: Insufficient documentation

## 2024-02-19 LAB — URINALYSIS, ROUTINE W REFLEX MICROSCOPIC
Bacteria, UA: NONE SEEN
Bilirubin Urine: NEGATIVE
Glucose, UA: NEGATIVE mg/dL
Ketones, ur: NEGATIVE mg/dL
Leukocytes,Ua: NEGATIVE
Nitrite: NEGATIVE
Protein, ur: NEGATIVE mg/dL
Specific Gravity, Urine: 1.015 (ref 1.005–1.030)
pH: 6 (ref 5.0–8.0)

## 2024-02-19 LAB — CBC WITH DIFFERENTIAL/PLATELET
Abs Immature Granulocytes: 0.01 K/uL (ref 0.00–0.07)
Basophils Absolute: 0 K/uL (ref 0.0–0.1)
Basophils Relative: 1 %
Eosinophils Absolute: 0.1 K/uL (ref 0.0–0.5)
Eosinophils Relative: 2 %
HCT: 31.2 % — ABNORMAL LOW (ref 36.0–46.0)
Hemoglobin: 9.8 g/dL — ABNORMAL LOW (ref 12.0–15.0)
Immature Granulocytes: 0 %
Lymphocytes Relative: 35 %
Lymphs Abs: 1.3 K/uL (ref 0.7–4.0)
MCH: 25.5 pg — ABNORMAL LOW (ref 26.0–34.0)
MCHC: 31.4 g/dL (ref 30.0–36.0)
MCV: 81.3 fL (ref 80.0–100.0)
Monocytes Absolute: 0.4 K/uL (ref 0.1–1.0)
Monocytes Relative: 11 %
Neutro Abs: 1.9 K/uL (ref 1.7–7.7)
Neutrophils Relative %: 51 %
Platelets: 367 K/uL (ref 150–400)
RBC: 3.84 MIL/uL — ABNORMAL LOW (ref 3.87–5.11)
RDW: 14.8 % (ref 11.5–15.5)
WBC: 3.7 K/uL — ABNORMAL LOW (ref 4.0–10.5)
nRBC: 0 % (ref 0.0–0.2)

## 2024-02-19 LAB — COMPREHENSIVE METABOLIC PANEL WITH GFR
ALT: 26 U/L (ref 0–44)
AST: 25 U/L (ref 15–41)
Albumin: 4.1 g/dL (ref 3.5–5.0)
Alkaline Phosphatase: 69 U/L (ref 38–126)
Anion gap: 10 (ref 5–15)
BUN: 14 mg/dL (ref 6–20)
CO2: 27 mmol/L (ref 22–32)
Calcium: 9.3 mg/dL (ref 8.9–10.3)
Chloride: 101 mmol/L (ref 98–111)
Creatinine, Ser: 0.67 mg/dL (ref 0.44–1.00)
GFR, Estimated: >60 mL/min (ref >60)
Glucose, Bld: 108 mg/dL — ABNORMAL HIGH (ref 70–99)
Potassium: 3.3 mmol/L — ABNORMAL LOW (ref 3.5–5.1)
Sodium: 138 mmol/L (ref 135–145)
Total Bilirubin: 0.3 mg/dL (ref 0.0–1.2)
Total Protein: 7.9 g/dL (ref 6.5–8.1)

## 2024-02-19 LAB — PREGNANCY, URINE: Preg Test, Ur: NEGATIVE

## 2024-02-19 MED ORDER — OXYCODONE-ACETAMINOPHEN 5-325 MG PO TABS
1.0000 | ORAL_TABLET | Freq: Once | ORAL | Status: AC
  Administered 2024-02-19: 1 via ORAL
  Filled 2024-02-19 (×2): qty 1

## 2024-02-19 MED ORDER — OXYCODONE-ACETAMINOPHEN 5-325 MG PO TABS: 1.0000 | ORAL_TABLET | Freq: Four times a day (QID) | ORAL | 0 refills | Status: AC | PRN

## 2024-02-19 NOTE — Discharge Instructions (Addendum)
 You were seen in the emergency department today for concerns of abdominal pain and elevated blood pressure.  Your labs were thankfully reassuring and does appear that your infection that you were hospitalized for is beginning to clear.  Continue taking antibiotics as prescribed.  Take your pain medications as prescribed.  For any concerns of new or worsening symptoms, return to the emergency department.

## 2024-02-19 NOTE — ED Triage Notes (Signed)
 Pt comes in with lower abdominal pain and hypertension. Pt was here on Nov 26th for a kidney infection and was prescribed antibiotics, but still experiencing pain and discomfort. Still taking the antibiotics.

## 2024-02-19 NOTE — ED Provider Notes (Signed)
 [place] EMERGENCY DEPARTMENT AT [facility] Provider Note   CSN: [id number] Arrival date & time: 02/19/24  1119     Patient presents with: Hypertension and Abdominal Pain   Sharon Johnston is a 33 y.o. female.  Patient with past history significant for hypertension, recent hospitalization for sepsis secondary to pyelonephritis presents the emergency department with concerns of hypertension and abdominal pain.  She states that she has been on her antibiotics for her pyelonephritis and has several days left but endorses some pain in her lower abdomen and bilateral flanks.  States that his pain is slightly better than when she initially was seen in the emergency department about 1 week ago.  No reported fevers.  Had 1 episode of vomiting yesterday.  No diarrhea.  Denies any other sick contacts.  No concern for pregnancy at this time.   Hypertension Associated symptoms include abdominal pain.  Abdominal Pain      Prior to Admission medications   Medication Sig Start Date End Date Taking? Authorizing Provider  oxyCODONE -acetaminophen  (PERCOCET/ROXICET) 5-325 MG tablet Take 1 tablet by mouth every 6 (six) hours as needed for severe pain (pain score 7-10). 02/19/24  Yes Avanish Cerullo A, PA-C  amLODipine  (NORVASC ) 5 MG tablet Take 1 tablet (5 mg total) by mouth daily. 08/10/23   Joshua Domino, DO  Blood Pressure Monitoring (BLOOD PRESSURE KIT) DEVI 1 kit by Does not apply route as needed. 08/30/22   Eveline Lynwood MATSU, MD  cefadroxil  (DURICEF) 500 MG capsule Take 1 capsule (500 mg total) by mouth 2 (two) times daily for 7 days. 02/16/24 02/23/24  Ghimire, Kuber, MD  HYDROcodone -acetaminophen  (NORCO/VICODIN) 5-325 MG tablet Take 1 tablet by mouth every 6 (six) hours as needed for up to 3 days for moderate pain (pain score 4-6) or severe pain (pain score 7-10). 02/16/24 02/19/24  Ghimire, Kuber, MD  norethindrone (MICRONOR) 0.35 MG tablet Take 1 tablet by mouth daily. Patient not taking:  Reported on 02/14/2024 11/27/23   [provider]  sertraline  (ZOLOFT ) 50 MG tablet Take 1 tablet (50 mg total) by mouth daily. 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 Patient not taking: Reported on 02/14/2024 11/21/23   Rolinda Rogue, MD  ferrous sulfate  325 (65 FE) MG tablet Take 1 tablet (325 mg total) by mouth 2 (two) times daily with a meal. 04/25/17 11/01/18  Phelps, Jazma Y, DO  medroxyPROGESTERone  (DEPO-PROVERA ) 150 MG/ML injection Inject 1 mL (150 mg total) into the muscle every 3 (three) months. 07/26/17 04/22/19  Eveline Lynwood MATSU, MD    Allergies: Patient has no known allergies.    Review of Systems  Gastrointestinal:  Positive for abdominal pain.  All other systems reviewed and are negative.   Updated Vital Signs BP (!) 167/110 (BP Location: Left Arm)   Pulse 63   Temp 98.1 F (36.7 C) (Oral)   Resp 16   Ht 5' 3 (1.6 m)   Wt 65.8 kg   LMP 01/08/2024 (Approximate)   SpO2 100%   BMI 25.69 kg/m   Physical Exam Vitals and nursing note reviewed.  Constitutional:      General: She is not in acute distress.    Appearance: She is well-developed.  HENT:     Head: Normocephalic and atraumatic.  Eyes:     Conjunctiva/sclera: Conjunctivae  normal.  Cardiovascular:     Rate and Rhythm: Normal rate and regular rhythm.     Heart sounds: No murmur heard. Pulmonary:     Effort: Pulmonary effort is normal. No respiratory distress.     Breath sounds: Normal breath sounds.  Abdominal:     Palpations: Abdomen is soft.     Tenderness: There is abdominal tenderness in the suprapubic area. There is right CVA tenderness and left CVA tenderness.     Comments: Left CVA tenderness > R CVA tenderness  Musculoskeletal:        General: No swelling.     Cervical back: Neck supple.  Skin:    General: Skin is  warm and dry.     Capillary Refill: Capillary refill takes less than 2 seconds.  Neurological:     Mental Status: She is alert.  Psychiatric:        Mood and Affect: Mood normal.     (all labs ordered are listed, but only abnormal results are displayed) Labs Reviewed  CBC WITH DIFFERENTIAL/PLATELET - Abnormal; Notable for the following components:      Result Value   WBC 3.7 (*)    RBC 3.84 (*)    Hemoglobin 9.8 (*)    HCT 31.2 (*)    MCH 25.5 (*)    All other components within normal limits  COMPREHENSIVE METABOLIC PANEL WITH GFR - Abnormal; Notable for the following components:   Potassium 3.3 (*)    Glucose, Bld 108 (*)    All other components within normal limits  URINALYSIS, ROUTINE W REFLEX MICROSCOPIC - Abnormal; Notable for the following components:   Hgb urine dipstick SMALL (*)    All other components within normal limits  PREGNANCY, URINE    EKG: None  Radiology: No results found.   Procedures   Medications Ordered in the ED  oxyCODONE -acetaminophen  (PERCOCET/ROXICET) 5-325 MG per tablet 1 tablet (1 tablet Oral Given 02/19/24 1222)                                    Medical Decision Making Amount and/or Complexity of Data Reviewed Labs: ordered.  Risk Prescription drug management.   This patient presents to the ED for concern of hypertension, abdominal pain, this involves an extensive number of treatment options, and is a complaint that carries with it a high risk of complications and morbidity.  The differential diagnosis includes pyelonephritis, uncontrolled hypertension, urolithiasis, dehydration, urosepsis   Co morbidities that complicate the patient evaluation  Hypertension, recently hospitalized for urosepsis   Additional history obtained:  Additional history obtained from chart review   Lab Tests:  I Ordered, and personally interpreted labs.  The pertinent results include: CBC mild leukopenia 3.7 improved compared to leukocytosis  from several days ago, hemoglobin stable at 9.8, CMP unremarkable with mild hypokalemia 3.3 but no evidence of AKI, UA negative for concerns of infection only small hemoglobin present, urine pregnancy negative   Consultations Obtained:  I requested consultation with none,  and discussed lab and imaging findings as well as pertinent plan - they recommend: N/A   Problem List / ED Course / Critical interventions / Medication management  Patient with past history significant for hypertension and recent hospitalization for sepsis due to pyelonephritis presents to the ED with concerns of hypertension and abdominal pain.  Reports seen on 11/26 and hospitalized for urosepsis.  Has been on antibiotics after discharge.  Denies any fever but primarily concerned for ongoing CVA tenderness and suprapubic discomfort.  Endorses some discomfort with urination.  Denies any concerns for pregnancy. On exam, patient has left CVA tenderness greater than right CVA tenderness.  Some suprapubic tenderness is also present.  No abnormal heart or lung sounds. Based on history and exam, I am concerned for possible sequela of pyelonephritis.  Doubtful given patient reports improving pain but will obtain basic labs to reassess and administer pain medications for attempted pain control.  Will hold off on imaging at this time. Patient's lab workup is largely reassuring with resolution of her leukocytosis.  UA without signs of infection.  CMP with only mild hypokalemia noted but no evidence of AKI.  Urine pregnancy negative.  Reevaluate patient after lab results stated and patient been given Percocet.  She reports resolution of her pain.  She is requesting to be discharged home at this time.  Given improvement in her labs, do not feel she would require repeat imaging at this time.  She is agreeable to this plan.  She does report that the Percocet helped her pain significantly more than the Norco she had been sent home with.  She is  requesting to have a small prescription of the Percocet to use at home for severe pain.  I feel this is appropriate.  Discharged home with a prescription for Percocet.  Return precautions advised such as concerns or worsening symptoms. I ordered medication including Percocet for pain Reevaluation of the patient after these medicines showed that the patient improved I have reviewed the patients home medicines and have made adjustments as needed   Social Determinants of Health:  None   Test / Admission - Considered:  Considered but stable for outpatient follow-up.  Final diagnoses:  Bilateral flank pain    ED Discharge Orders          Ordered    oxyCODONE -acetaminophen  (PERCOCET/ROXICET) 5-325 MG tablet  Every 6 hours PRN        02/19/24 1357               Cecily Legrand LABOR, PA-C 02/19/24 2017    Franklyn Sid SAILOR, MD 02/20/24 [phone]

## 2024-02-20 LAB — CULTURE, BLOOD (ROUTINE X 2)
Culture: NO GROWTH
Special Requests: ADEQUATE

## 2024-02-20 LAB — GC/CHLAMYDIA PROBE AMP (~~LOC~~) NOT AT ARMC
Chlamydia: NEGATIVE
Comment: NEGATIVE
Comment: NORMAL
Neisseria Gonorrhea: NEGATIVE

## 2024-03-28 ENCOUNTER — Ambulatory Visit (HOSPITAL_COMMUNITY)

## 2024-04-02 ENCOUNTER — Emergency Department (HOSPITAL_COMMUNITY)

## 2024-04-02 ENCOUNTER — Ambulatory Visit (HOSPITAL_COMMUNITY): Payer: Self-pay

## 2024-04-02 ENCOUNTER — Encounter (HOSPITAL_COMMUNITY): Payer: Self-pay

## 2024-04-02 ENCOUNTER — Ambulatory Visit (HOSPITAL_COMMUNITY): Admitting: Clinical

## 2024-04-02 ENCOUNTER — Other Ambulatory Visit: Payer: Self-pay

## 2024-04-02 ENCOUNTER — Emergency Department (HOSPITAL_COMMUNITY)
Admission: EM | Admit: 2024-04-02 | Discharge: 2024-04-03 | Disposition: A | Attending: Emergency Medicine | Admitting: Emergency Medicine

## 2024-04-02 ENCOUNTER — Emergency Department (HOSPITAL_COMMUNITY): Admission: EM | Admit: 2024-04-02 | Discharge: 2024-04-02 | Disposition: A | Discharge status: Home / Self Care

## 2024-04-02 DIAGNOSIS — N12 Tubulo-interstitial nephritis, not specified as acute or chronic: Secondary | ICD-10-CM | POA: Diagnosis not present

## 2024-04-02 DIAGNOSIS — R10A1 Flank pain, right side: Secondary | ICD-10-CM | POA: Diagnosis present

## 2024-04-02 LAB — BASIC METABOLIC PANEL WITH GFR
Anion gap: 12 (ref 5–15)
BUN: 13 mg/dL (ref 6–20)
CO2: 20 mmol/L — ABNORMAL LOW (ref 22–32)
Calcium: 8.9 mg/dL (ref 8.9–10.3)
Chloride: 102 mmol/L (ref 98–111)
Creatinine, Ser: 0.91 mg/dL (ref 0.44–1.00)
GFR, Estimated: >60 mL/min
Glucose, Bld: 103 mg/dL — ABNORMAL HIGH (ref 70–99)
Potassium: 3.5 mmol/L (ref 3.5–5.1)
Sodium: 134 mmol/L — ABNORMAL LOW (ref 135–145)

## 2024-04-02 LAB — CBC WITH DIFFERENTIAL/PLATELET
Abs Immature Granulocytes: 0.06 K/uL (ref 0.00–0.07)
Basophils Absolute: 0 K/uL (ref 0.0–0.1)
Basophils Relative: 0 %
Eosinophils Absolute: 0 K/uL (ref 0.0–0.5)
Eosinophils Relative: 0 %
HCT: 33.8 % — ABNORMAL LOW (ref 36.0–46.0)
Hemoglobin: 10.6 g/dL — ABNORMAL LOW (ref 12.0–15.0)
Immature Granulocytes: 1 %
Lymphocytes Relative: 9 %
Lymphs Abs: 0.8 K/uL (ref 0.7–4.0)
MCH: 25.7 pg — ABNORMAL LOW (ref 26.0–34.0)
MCHC: 31.4 g/dL (ref 30.0–36.0)
MCV: 81.8 fL (ref 80.0–100.0)
Monocytes Absolute: 0.6 K/uL (ref 0.1–1.0)
Monocytes Relative: 7 %
Neutro Abs: 8.2 K/uL — ABNORMAL HIGH (ref 1.7–7.7)
Neutrophils Relative %: 83 %
Platelets: 320 K/uL (ref 150–400)
RBC: 4.13 MIL/uL (ref 3.87–5.11)
RDW: 15.6 % — ABNORMAL HIGH (ref 11.5–15.5)
WBC: 9.7 K/uL (ref 4.0–10.5)
nRBC: 0 % (ref 0.0–0.2)

## 2024-04-02 LAB — HCG, SERUM, QUALITATIVE: Preg, Serum: NEGATIVE

## 2024-04-02 MED ORDER — OXYCODONE-ACETAMINOPHEN 5-325 MG PO TABS
1.0000 | ORAL_TABLET | Freq: Once | ORAL | Status: AC
  Administered 2024-04-02: 1 via ORAL
  Filled 2024-04-02: qty 1

## 2024-04-02 MED ORDER — ONDANSETRON 4 MG PO TBDP
4.0000 mg | ORAL_TABLET | Freq: Once | ORAL | Status: AC
  Administered 2024-04-02: 4 mg via ORAL
  Filled 2024-04-02: qty 1

## 2024-04-02 MED ORDER — ACETAMINOPHEN 325 MG PO TABS
650.0000 mg | ORAL_TABLET | Freq: Once | ORAL | Status: AC
  Administered 2024-04-02: 650 mg via ORAL
  Filled 2024-04-02: qty 2

## 2024-04-02 NOTE — ED Triage Notes (Signed)
 Pt Bib [facility] for low back pain that radiates into pelvic region with increased urinary frequency and pain x 2 days. Pt states that this feels similar to previous UTI's

## 2024-04-02 NOTE — ED Provider Triage Note (Signed)
 Emergency Medicine Provider Triage Evaluation Note  Sharon Johnston , a 34 y.o. female  was evaluated in triage.  Pt complains of dysuria.  Started 2 days ago.  Also endorsing bladder pain and left flank pain.  Reports history of UTIs and states that her pain feels similar.  Review of Systems  Positive: See above Negative: See above  Physical Exam  LMP 02/27/2024 (Exact Date)  Gen:   Awake, no distress   Resp:  Normal effort  MSK:   Moves extremities without difficulty  Other:  Left cva tenderness  Medical Decision Making  Medically screening exam initiated at 9:08 PM.  Appropriate orders placed.  Sharon Johnston was informed that the remainder of the evaluation will be completed by another provider, this initial triage assessment does not replace that evaluation, and the importance of remaining in the ED until their evaluation is complete.  Work up started   Sharon Norleen POUR, PA-C 04/02/24 2110

## 2024-04-03 DIAGNOSIS — N12 Tubulo-interstitial nephritis, not specified as acute or chronic: Secondary | ICD-10-CM | POA: Diagnosis not present

## 2024-04-03 LAB — URINALYSIS, ROUTINE W REFLEX MICROSCOPIC
Bilirubin Urine: NEGATIVE
Glucose, UA: NEGATIVE mg/dL
Ketones, ur: 20 mg/dL — AB
Nitrite: NEGATIVE
Protein, ur: 100 mg/dL — AB
Specific Gravity, Urine: 1.019 (ref 1.005–1.030)
Squamous Epithelial / HPF: >50 /HPF (ref 0–5)
WBC, UA: >50 WBC/hpf (ref 0–5)
pH: 5 (ref 5.0–8.0)

## 2024-04-03 MED ORDER — KETOROLAC TROMETHAMINE 30 MG/ML IJ SOLN
15.0000 mg | Freq: Once | INTRAMUSCULAR | Status: AC
  Administered 2024-04-03: 15 mg via INTRAVENOUS
  Filled 2024-04-03: qty 1

## 2024-04-03 MED ORDER — ONDANSETRON HCL 4 MG/2ML IJ SOLN
4.0000 mg | Freq: Once | INTRAMUSCULAR | Status: AC
  Administered 2024-04-03: 4 mg via INTRAVENOUS
  Filled 2024-04-03: qty 2

## 2024-04-03 MED ORDER — OXYCODONE-ACETAMINOPHEN 5-325 MG PO TABS: 1.0000 | ORAL_TABLET | Freq: Four times a day (QID) | ORAL | 0 refills | Status: AC | PRN

## 2024-04-03 MED ORDER — LACTATED RINGERS IV BOLUS
1000.0000 mL | Freq: Once | INTRAVENOUS | Status: AC
  Administered 2024-04-03: 1000 mL via INTRAVENOUS

## 2024-04-03 MED ORDER — CEPHALEXIN 500 MG PO CAPS: 500.0000 mg | ORAL_CAPSULE | Freq: Four times a day (QID) | ORAL | 0 refills | Status: AC

## 2024-04-03 MED ORDER — ACETAMINOPHEN 325 MG PO TABS
650.0000 mg | ORAL_TABLET | Freq: Once | ORAL | Status: AC | PRN
  Administered 2024-04-03: 650 mg via ORAL
  Filled 2024-04-03: qty 2

## 2024-04-03 MED ORDER — LACTATED RINGERS IV SOLN: INTRAVENOUS | Status: DC

## 2024-04-03 MED ORDER — SODIUM CHLORIDE 0.9 % IV SOLN
1.0000 g | INTRAVENOUS | Status: DC
  Administered 2024-04-03: 1 g via INTRAVENOUS
  Filled 2024-04-03: qty 10

## 2024-04-03 NOTE — ED Provider Notes (Signed)
 " [place] EMERGENCY DEPARTMENT AT [facility] Provider Note   CSN: [id number] Arrival date & time: 04/02/24  2052     Patient presents with: Dysuria   Bayley PHYLLISTINE DOMINGOS is a 34 y.o. female.   34 year old female who presents with right-sided flank pain times several days.  History of UTI and this feels similar.  She has had nausea but no vomiting.  States the pain has been constant and radiates to her suprapubic region.  Denies any diarrhea.  No URI symptoms.  No treatment used prior to arrival       Prior to Admission medications  Medication Sig Start Date End Date Taking? Authorizing Provider  amLODipine  (NORVASC ) 5 MG tablet Take 1 tablet (5 mg total) by mouth daily. 08/10/23   Joshua Domino, DO  Blood Pressure Monitoring (BLOOD PRESSURE KIT) DEVI 1 kit by Does not apply route as needed. 08/30/22   Arnold, James G, MD  norethindrone (MICRONOR) 0.35 MG tablet Take 1 tablet by mouth daily. Patient not taking: Reported on 02/14/2024 11/27/23   [provider]  oxyCODONE -acetaminophen  (PERCOCET/ROXICET) 5-325 MG tablet Take 1 tablet by mouth every 6 (six) hours as needed for severe pain (pain score 7-10). 02/19/24   Zelaya, Oscar A, PA-C  sertraline  (ZOLOFT ) 50 MG tablet Take 1 tablet (50 mg total) by mouth daily. 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 Patient not taking: Reported on 02/14/2024 11/21/23   Rolinda Rogue, MD  ferrous sulfate  325 (65 FE) MG tablet Take 1 tablet (325 mg total) by mouth 2 (two) times daily with a meal. 04/25/17 11/01/18  Phelps, Jazma Y, DO  medroxyPROGESTERone  (DEPO-PROVERA ) 150 MG/ML injection Inject 1 mL (150 mg total) into the muscle every 3 (three) months. 07/26/17 04/22/19  Eveline Lynwood MATSU, MD    Allergies: Patient has no known allergies.    Review of Systems  All  other systems reviewed and are negative.   Updated Vital Signs BP 135/75 (BP Location: Right Arm)   Pulse (!) 120   Temp (!) 102.4 F (39.1 C) (Axillary)   Resp 18   LMP 02/27/2024 (Exact Date)   SpO2 99%   Physical Exam Vitals and nursing note reviewed.  Constitutional:      General: She is not in acute distress.    Appearance: Normal appearance. She is well-developed. She is not toxic-appearing.  HENT:     Head: Normocephalic and atraumatic.  Eyes:     General: Lids are normal.     Conjunctiva/sclera: Conjunctivae normal.     Pupils: Pupils are equal, round, and reactive to light.  Neck:     Thyroid: No thyroid mass.     Trachea: No tracheal deviation.  Cardiovascular:     Rate and Rhythm: Normal rate and regular rhythm.     Heart sounds: Normal heart sounds. No murmur heard.    No gallop.  Pulmonary:     Effort: Pulmonary effort is normal. No respiratory distress.     Breath sounds: Normal breath sounds. No stridor. No decreased breath sounds, wheezing, rhonchi or rales.  Abdominal:     General: There is no distension.     Palpations: Abdomen is soft.     Tenderness: There is no abdominal tenderness. There is right CVA tenderness. There is no rebound.  Musculoskeletal:  General: No tenderness. Normal range of motion.     Cervical back: Normal range of motion and neck supple.  Skin:    General: Skin is warm and dry.     Findings: No abrasion or rash.  Neurological:     Mental Status: She is alert and oriented to person, place, and time. Mental status is at baseline.     GCS: GCS eye subscore is 4. GCS verbal subscore is 5. GCS motor subscore is 6.     Cranial Nerves: No cranial nerve deficit.     Sensory: No sensory deficit.     Motor: Motor function is intact.  Psychiatric:        Attention and Perception: Attention normal.        Speech: Speech normal.        Behavior: Behavior normal.     (all labs ordered are listed, but only abnormal results are  displayed) Labs Reviewed  CBC WITH DIFFERENTIAL/PLATELET - Abnormal; Notable for the following components:      Result Value   Hemoglobin 10.6 (*)    HCT 33.8 (*)    MCH 25.7 (*)    RDW 15.6 (*)    Neutro Abs 8.2 (*)    All other components within normal limits  BASIC METABOLIC PANEL WITH GFR - Abnormal; Notable for the following components:   Sodium 134 (*)    CO2 20 (*)    Glucose, Bld 103 (*)    All other components within normal limits  URINALYSIS, ROUTINE W REFLEX MICROSCOPIC - Abnormal; Notable for the following components:   Color, Urine AMBER (*)    APPearance TURBID (*)    Hgb urine dipstick LARGE (*)    Ketones, ur 20 (*)    Protein, ur 100 (*)    Leukocytes,Ua LARGE (*)    Bacteria, UA MANY (*)    Non Squamous Epithelial 0-5 (*)    All other components within normal limits  HCG, SERUM, QUALITATIVE    EKG: None  Radiology: CT Renal Stone Study Result Date: 04/02/2024 EXAM: CT ABDOMEN AND PELVIS WITHOUT CONTRAST 04/02/2024 10:33:46 PM TECHNIQUE: CT of the abdomen and pelvis was performed without the administration of intravenous contrast. Multiplanar reformatted images are provided for review. Automated exposure control, iterative reconstruction, and/or weight-based adjustment of the mA/kV was utilized to reduce the radiation dose to as low as reasonably achievable. COMPARISON: 02/14/2024. CLINICAL HISTORY: Abdominal/flank pain, stone suspected. Urinary frequency, dysuria, low back pain. FINDINGS: LOWER CHEST: No acute abnormality. LIVER: The liver is unremarkable. GALLBLADDER AND BILE DUCTS: Gallbladder is unremarkable. No biliary ductal dilatation. SPLEEN: No acute abnormality. PANCREAS: No acute abnormality. ADRENAL GLANDS: No acute abnormality. KIDNEYS, URETERS AND BLADDER: The kidneys are normal in size and position. There is asymmetric moderate left perinephric inflammatory stranding in keeping with a unilateral inflammatory process, likely pyelonephritis. There is no  hydronephrosis. No intrarenal or ureteral calculi. No perinephric or periureteral stranding on the right. Urinary bladder is unremarkable. GI AND BOWEL: Stomach demonstrates no acute abnormality. Appendix normal. The small bowel and large bowel are otherwise unremarkable. There is no bowel obstruction. PERITONEUM AND RETROPERITONEUM: No ascites. No free air. VASCULATURE: Aorta is normal in caliber. LYMPH NODES: No lymphadenopathy. REPRODUCTIVE ORGANS: No acute abnormality. BONES AND SOFT TISSUES: No acute osseous abnormality. No focal soft tissue abnormality. IMPRESSION: 1. Asymmetric moderate left perinephric inflammatory stranding, likely pyelonephritis, without hydronephrosis or calculi. Correlation with urinalysis and urine culture will be helpful for further management. Electronically signed by: Dorethia  Parikh MD 04/02/2024 10:41 PM EST RP Workstation: [id number]     Procedures   Medications Ordered in the ED  ketorolac  (TORADOL ) 30 MG/ML injection 15 mg (has no administration in time range)  lactated ringers  infusion (has no administration in time range)  lactated ringers  bolus 1,000 mL (has no administration in time range)  cefTRIAXone  (ROCEPHIN ) 1 g in sodium chloride  0.9 % 100 mL IVPB (has no administration in time range)  ondansetron  (ZOFRAN ) injection 4 mg (has no administration in time range)  ondansetron  (ZOFRAN -ODT) disintegrating tablet 4 mg (4 mg Oral Given 04/02/24 2114)  oxyCODONE -acetaminophen  (PERCOCET/ROXICET) 5-325 MG per tablet 1 tablet (1 tablet Oral Given 04/02/24 2114)  acetaminophen  (TYLENOL ) tablet 650 mg (650 mg Oral Given 04/02/24 2114)  acetaminophen  (TYLENOL ) tablet 650 mg (650 mg Oral Given 04/03/24 0543)                                    Medical Decision Making Amount and/or Complexity of Data Reviewed Labs: ordered.  Risk OTC drugs. Prescription drug management.   Patient treated with IV fluids, pain medications, IV antibiotics after urinalysis does show  evidence of UTI which then became pyelonephritis after results of patient's CT scan.  Patient's pain is controlled.  She is not having emesis.  Offered admission but has deferred at this time.  Will place on oral antibiotics as well as give pain medications and return precautions given     Final diagnoses:  None    ED Discharge Orders     None          Dasie Faden, MD 04/03/24 1024  "

## 2024-04-18 ENCOUNTER — Ambulatory Visit (HOSPITAL_COMMUNITY): Payer: Self-pay
